# Patient Record
Sex: Female | Born: 1965 | Race: White | Hispanic: No | Smoking: Never smoker
Health system: Southern US, Community
[De-identification: ages and names within clinical notes are randomized; demographics above are authoritative.]

## PROBLEM LIST (undated history)

## (undated) ENCOUNTER — Encounter

## (undated) ENCOUNTER — Telehealth

## (undated) ENCOUNTER — Ambulatory Visit

## (undated) ENCOUNTER — Ambulatory Visit: Payer: PRIVATE HEALTH INSURANCE | Attending: Medical | Primary: Medical

## (undated) ENCOUNTER — Ambulatory Visit: Attending: Pharmacist | Primary: Pharmacist

## (undated) ENCOUNTER — Ambulatory Visit: Payer: PRIVATE HEALTH INSURANCE

## (undated) ENCOUNTER — Encounter: Attending: Internal Medicine | Primary: Internal Medicine

## (undated) ENCOUNTER — Ambulatory Visit: Payer: PRIVATE HEALTH INSURANCE | Attending: Podiatrist | Primary: Podiatrist

## (undated) ENCOUNTER — Inpatient Hospital Stay

## (undated) ENCOUNTER — Encounter: Attending: Transplant Hepatology | Primary: Transplant Hepatology

## (undated) ENCOUNTER — Other Ambulatory Visit

## (undated) ENCOUNTER — Inpatient Hospital Stay: Payer: PRIVATE HEALTH INSURANCE

## (undated) ENCOUNTER — Ambulatory Visit: Payer: PRIVATE HEALTH INSURANCE | Attending: Family Medicine | Primary: Family Medicine

## (undated) ENCOUNTER — Encounter
Attending: Student in an Organized Health Care Education/Training Program | Primary: Student in an Organized Health Care Education/Training Program

## (undated) ENCOUNTER — Ambulatory Visit
Payer: PRIVATE HEALTH INSURANCE | Attending: Student in an Organized Health Care Education/Training Program | Primary: Student in an Organized Health Care Education/Training Program

## (undated) ENCOUNTER — Ambulatory Visit: Payer: PRIVATE HEALTH INSURANCE | Attending: Hospice and Palliative Medicine | Primary: Hospice and Palliative Medicine

## (undated) ENCOUNTER — Ambulatory Visit: Payer: PRIVATE HEALTH INSURANCE | Attending: Foot & Ankle Surgery | Primary: Foot & Ankle Surgery

## (undated) ENCOUNTER — Telehealth
Attending: Student in an Organized Health Care Education/Training Program | Primary: Student in an Organized Health Care Education/Training Program

## (undated) ENCOUNTER — Emergency Department: Admission: EM | Payer: BC Managed Care – PPO

## (undated) DIAGNOSIS — M1990 Unspecified osteoarthritis, unspecified site: Secondary | ICD-10-CM

## (undated) DIAGNOSIS — G4700 Insomnia, unspecified: Secondary | ICD-10-CM

## (undated) DIAGNOSIS — R32 Unspecified urinary incontinence: Secondary | ICD-10-CM

## (undated) DIAGNOSIS — F329 Major depressive disorder, single episode, unspecified: Secondary | ICD-10-CM

## (undated) DIAGNOSIS — J45909 Unspecified asthma, uncomplicated: Secondary | ICD-10-CM

## (undated) DIAGNOSIS — R200 Anesthesia of skin: Secondary | ICD-10-CM

## (undated) DIAGNOSIS — C50919 Malignant neoplasm of unspecified site of unspecified female breast: Secondary | ICD-10-CM

## (undated) DIAGNOSIS — E119 Type 2 diabetes mellitus without complications: Secondary | ICD-10-CM

## (undated) DIAGNOSIS — F32A Depression, unspecified: Secondary | ICD-10-CM

## (undated) DIAGNOSIS — R42 Dizziness and giddiness: Secondary | ICD-10-CM

## (undated) DIAGNOSIS — G2581 Restless legs syndrome: Secondary | ICD-10-CM

## (undated) DIAGNOSIS — R252 Cramp and spasm: Secondary | ICD-10-CM

## (undated) DIAGNOSIS — K760 Fatty (change of) liver, not elsewhere classified: Secondary | ICD-10-CM

## (undated) DIAGNOSIS — M25569 Pain in unspecified knee: Secondary | ICD-10-CM

## (undated) DIAGNOSIS — E785 Hyperlipidemia, unspecified: Secondary | ICD-10-CM

## (undated) HISTORY — PX: CHOLECYSTECTOMY: SHX55

## (undated) HISTORY — PX: OTHER SURGICAL HISTORY: SHX169

## (undated) HISTORY — PX: ABDOMINAL HYSTERECTOMY: SHX81

---
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
## 2011-02-13 ENCOUNTER — Ambulatory Visit: Payer: Self-pay | Admitting: Gastroenterology

## 2011-02-20 ENCOUNTER — Ambulatory Visit: Payer: Self-pay | Admitting: Gastroenterology

## 2012-07-08 DIAGNOSIS — C50919 Malignant neoplasm of unspecified site of unspecified female breast: Secondary | ICD-10-CM

## 2012-07-08 HISTORY — DX: Malignant neoplasm of unspecified site of unspecified female breast: C50.919

## 2012-07-08 HISTORY — PX: BREAST LUMPECTOMY: SHX2

## 2013-04-06 ENCOUNTER — Ambulatory Visit: Payer: Self-pay | Admitting: Obstetrics and Gynecology

## 2013-04-14 ENCOUNTER — Ambulatory Visit: Payer: Self-pay | Admitting: Obstetrics and Gynecology

## 2013-04-26 ENCOUNTER — Ambulatory Visit: Payer: Self-pay | Admitting: Obstetrics and Gynecology

## 2013-04-26 HISTORY — PX: BREAST BIOPSY: SHX20

## 2013-05-08 HISTORY — PX: BREAST EXCISIONAL BIOPSY: SUR124

## 2013-05-13 ENCOUNTER — Ambulatory Visit: Payer: Self-pay | Admitting: Surgery

## 2013-05-13 LAB — BASIC METABOLIC PANEL
Anion Gap: 2 — ABNORMAL LOW (ref 7–16)
BUN: 7 mg/dL (ref 7–18)
Chloride: 105 mmol/L (ref 98–107)
Co2: 28 mmol/L (ref 21–32)
EGFR (African American): >60
EGFR (Non-African Amer.): >60
Glucose: 101 mg/dL — ABNORMAL HIGH (ref 65–99)
Osmolality: 268 (ref 275–301)
Potassium: 4.5 mmol/L (ref 3.5–5.1)
Sodium: 135 mmol/L — ABNORMAL LOW (ref 136–145)

## 2013-05-13 LAB — CBC WITH DIFFERENTIAL/PLATELET
Eosinophil #: 0.1 10*3/uL (ref 0.0–0.7)
Eosinophil %: 1.4 %
HCT: 40 % (ref 35.0–47.0)
HGB: 13.8 g/dL (ref 12.0–16.0)
Lymphocyte #: 2.4 10*3/uL (ref 1.0–3.6)
Lymphocyte %: 31.1 %
MCHC: 34.5 g/dL (ref 32.0–36.0)
MCV: 88 fL (ref 80–100)
Monocyte #: 0.5 x10 3/mm (ref 0.2–0.9)
Neutrophil %: 60.3 %
Platelet: 242 10*3/uL (ref 150–440)
RDW: 14 % (ref 11.5–14.5)

## 2013-05-18 ENCOUNTER — Ambulatory Visit: Payer: Self-pay | Admitting: Surgery

## 2013-06-01 ENCOUNTER — Ambulatory Visit: Payer: Self-pay | Admitting: Hematology and Oncology

## 2013-06-08 ENCOUNTER — Ambulatory Visit: Payer: Self-pay | Admitting: Radiation Oncology

## 2013-06-25 LAB — CBC CANCER CENTER
Basophil #: 0.1 x10 3/mm (ref 0.0–0.1)
Eosinophil %: 2.2 %
HCT: 41.6 % (ref 35.0–47.0)
HGB: 13.7 g/dL (ref 12.0–16.0)
MCH: 29.8 pg (ref 26.0–34.0)
Neutrophil #: 4.6 x10 3/mm (ref 1.4–6.5)
RBC: 4.6 10*6/uL (ref 3.80–5.20)
RDW: 13.5 % (ref 11.5–14.5)
WBC: 8.2 x10 3/mm (ref 3.6–11.0)

## 2013-06-25 LAB — COMPREHENSIVE METABOLIC PANEL
Albumin: 3.7 g/dL (ref 3.4–5.0)
Alkaline Phosphatase: 57 U/L
BUN: 9 mg/dL (ref 7–18)
Calcium, Total: 9.3 mg/dL (ref 8.5–10.1)
Creatinine: 0.87 mg/dL (ref 0.60–1.30)
EGFR (African American): >60
Potassium: 4.9 mmol/L (ref 3.5–5.1)
SGOT(AST): 26 U/L (ref 15–37)
Sodium: 138 mmol/L (ref 136–145)

## 2013-07-06 LAB — COMPREHENSIVE METABOLIC PANEL
Albumin: 3.7 g/dL (ref 3.4–5.0)
Anion Gap: 6 — ABNORMAL LOW (ref 7–16)
Creatinine: 0.91 mg/dL (ref 0.60–1.30)
Glucose: 153 mg/dL — ABNORMAL HIGH (ref 65–99)
Osmolality: 274 (ref 275–301)
SGPT (ALT): 55 U/L (ref 12–78)
Sodium: 136 mmol/L (ref 136–145)

## 2013-07-06 LAB — CBC CANCER CENTER
Basophil %: 0.7 %
Eosinophil #: 0.2 x10 3/mm (ref 0.0–0.7)
Eosinophil %: 2.3 %
HCT: 41.8 % (ref 35.0–47.0)
Lymphocyte #: 3.1 x10 3/mm (ref 1.0–3.6)
MCHC: 33 g/dL (ref 32.0–36.0)
MCV: 90 fL (ref 80–100)
Monocyte #: 0.5 x10 3/mm (ref 0.2–0.9)
Monocyte %: 5.5 %
Neutrophil #: 4.5 x10 3/mm (ref 1.4–6.5)
Neutrophil %: 53.8 %
Platelet: 255 x10 3/mm (ref 150–440)
RDW: 13.3 % (ref 11.5–14.5)
WBC: 8.3 x10 3/mm (ref 3.6–11.0)

## 2013-07-08 ENCOUNTER — Ambulatory Visit: Payer: Self-pay | Admitting: Radiation Oncology

## 2013-09-28 ENCOUNTER — Ambulatory Visit: Payer: Self-pay | Admitting: Hematology and Oncology

## 2013-10-01 LAB — CANCER ANTIGEN 27.29: CA 27.29: 23.4 U/mL (ref 0.0–38.6)

## 2013-10-06 ENCOUNTER — Ambulatory Visit: Payer: Self-pay | Admitting: Hematology and Oncology

## 2014-03-08 ENCOUNTER — Ambulatory Visit: Payer: Self-pay | Admitting: Internal Medicine

## 2014-04-01 LAB — COMPREHENSIVE METABOLIC PANEL
ALBUMIN: 3.5 g/dL (ref 3.4–5.0)
ANION GAP: 6 — AB (ref 7–16)
AST: 65 U/L — AB (ref 15–37)
Alkaline Phosphatase: 71 U/L
BUN: 7 mg/dL (ref 7–18)
Bilirubin,Total: 0.4 mg/dL (ref 0.2–1.0)
CALCIUM: 8.9 mg/dL (ref 8.5–10.1)
Chloride: 100 mmol/L (ref 98–107)
Co2: 28 mmol/L (ref 21–32)
Creatinine: 1 mg/dL (ref 0.60–1.30)
EGFR (African American): >60
Glucose: 135 mg/dL — ABNORMAL HIGH (ref 65–99)
Osmolality: 268 (ref 275–301)
Potassium: 4.1 mmol/L (ref 3.5–5.1)
SGPT (ALT): 81 U/L — ABNORMAL HIGH
Sodium: 134 mmol/L — ABNORMAL LOW (ref 136–145)
Total Protein: 6.9 g/dL (ref 6.4–8.2)

## 2014-04-01 LAB — CBC CANCER CENTER
BASOS PCT: 0.7 %
Basophil #: 0 x10 3/mm (ref 0.0–0.1)
EOS ABS: 0.1 x10 3/mm (ref 0.0–0.7)
EOS PCT: 1.9 %
HCT: 42.7 % (ref 35.0–47.0)
HGB: 14.2 g/dL (ref 12.0–16.0)
LYMPHS ABS: 1.7 x10 3/mm (ref 1.0–3.6)
Lymphocyte %: 30.6 %
MCH: 30.6 pg (ref 26.0–34.0)
MCHC: 33.3 g/dL (ref 32.0–36.0)
MCV: 92 fL (ref 80–100)
MONOS PCT: 5.9 %
Monocyte #: 0.3 x10 3/mm (ref 0.2–0.9)
Neutrophil #: 3.4 x10 3/mm (ref 1.4–6.5)
Neutrophil %: 60.9 %
Platelet: 185 x10 3/mm (ref 150–440)
RBC: 4.64 10*6/uL (ref 3.80–5.20)
RDW: 13.2 % (ref 11.5–14.5)
WBC: 5.6 x10 3/mm (ref 3.6–11.0)

## 2014-04-04 LAB — CANCER ANTIGEN 27.29: CA 27.29: 19.8 U/mL (ref 0.0–38.6)

## 2014-04-07 ENCOUNTER — Ambulatory Visit: Payer: Self-pay | Admitting: Internal Medicine

## 2014-04-26 ENCOUNTER — Encounter: Payer: Self-pay | Admitting: Internal Medicine

## 2014-05-08 ENCOUNTER — Ambulatory Visit: Payer: Self-pay | Admitting: Internal Medicine

## 2014-09-30 ENCOUNTER — Ambulatory Visit
Admit: 2014-09-30 | Disposition: A | Discharge status: Home / Self Care | Payer: Self-pay | Attending: Internal Medicine | Admitting: Internal Medicine

## 2014-10-28 NOTE — Op Note (Signed)
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 final pathology pending.   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   PROCEDURE PERFORMED: Right breast needle localization, excisional biopsy.   SURGEON: Sherri Rad, MD   ASSISTANT: None.   ANESTHESIA: General with local.   ESTIMATED BLOOD LOSS: 20 mL.   DESCRIPTION OF PROCEDURE: With appropriate ammogram directed needle localization on the right side, the patient was brought to the Operating Room and positioned supine. Percutaneous monitoring lines were placed. General anesthesia was used. The right breast was sterilely prepped and draped with ChloraPrep solution. A timeout was observed. Wire was amputated near the skin site. A curvilinear incision in the right upper outer quadrant was fashioned medial to the wire entrance site. The wire was brought up through this incision. Circumferential biopsy was then achieved tracking medially and slightly superiorly to directly behind the nipple. Wiretip was captured in its entirety with a robust biopsy specimen. A Hemoclip was placed at the tip of the needle site. The specimen was oriented for pathology and submitted in a mammogram specimen container, returning adequate specimen by mammography.   The wound was then irrigated, hemostasis being obtained with point cautery. A total of 30 mL of 0.25% plain Marcaine was infiltrated along all skin and incisions, and deep. The wound was reapproximated in the deep layer with interrupted 3-0 Vicryl, interrupted 3-0 Vicryl in the deep dermal layer, 4-0 Vicryl subcuticular in the skin, benzoin, Steri-Strips, Telfa and Tegaderm. The patient was then subsequently extubated and sent to the recovery room in stable and satisfactory condition by Anesthesia  services.   ____________________________ Jeannette How Marisa Gravel, MD mab:mr D: 05/18/2013 17:56:15 ET T: 05/18/2013 20:08:30 ET JOB#: [id number]  cc: Elta Guadeloupe A. Marisa Gravel, MD, <Dictator> Hortencia Conradi MD ELECTRONICALLY SIGNED 05/23/2013 16:37

## 2014-10-28 NOTE — Consult Note (Signed)
Diagnosis:  HPI   patient referral to me was not indicated based on her having lobular carcinoma in situ which is not treated with radiation therapy. Patient may be a candidate for chemoprevention with tamoxifen and she has appointment in followup with Dr. Kallie Edward in 2 weeks. I have explained to her husband the entire situation. They are pleased with the overall explanation and followup appointment.  Allergies:   PCN: Other  Electronic Signatures: Fairy Ashlock, Roda Shutters (MD)  (Signed 02-Dec-14 15:31)  Authored: Diagnosis, Allergies   Last Updated: 02-Dec-14 15:31 by Armstead Peaks (MD)

## 2014-10-31 ENCOUNTER — Ambulatory Visit
Admit: 2014-10-31 | Disposition: A | Discharge status: Home / Self Care | Payer: Self-pay | Attending: Family Medicine | Admitting: Family Medicine

## 2014-12-04 IMAGING — MG MM ADDITIONAL VIEWS AT NO CHARGE
1 series · 3 of 3 positions shown · non-contrast
Comparison: 04/06/2013, baseline study

REASON FOR EXAM: AV RT BRST CALCS
COMMENTS:

PROCEDURE:     MAM - MAM DIG ADDVIEWS RT SCR  - April 14, 2013  [DATE]
CLINICAL DATA: The patient returns to evaluate 2 possible groupings
of calcifications in the right breast.
EXAM:
DIGITAL DIAGNOSTIC  right MAMMOGRAM

[R ML · right · 3 of 3 slices shown]
[im 1/3]
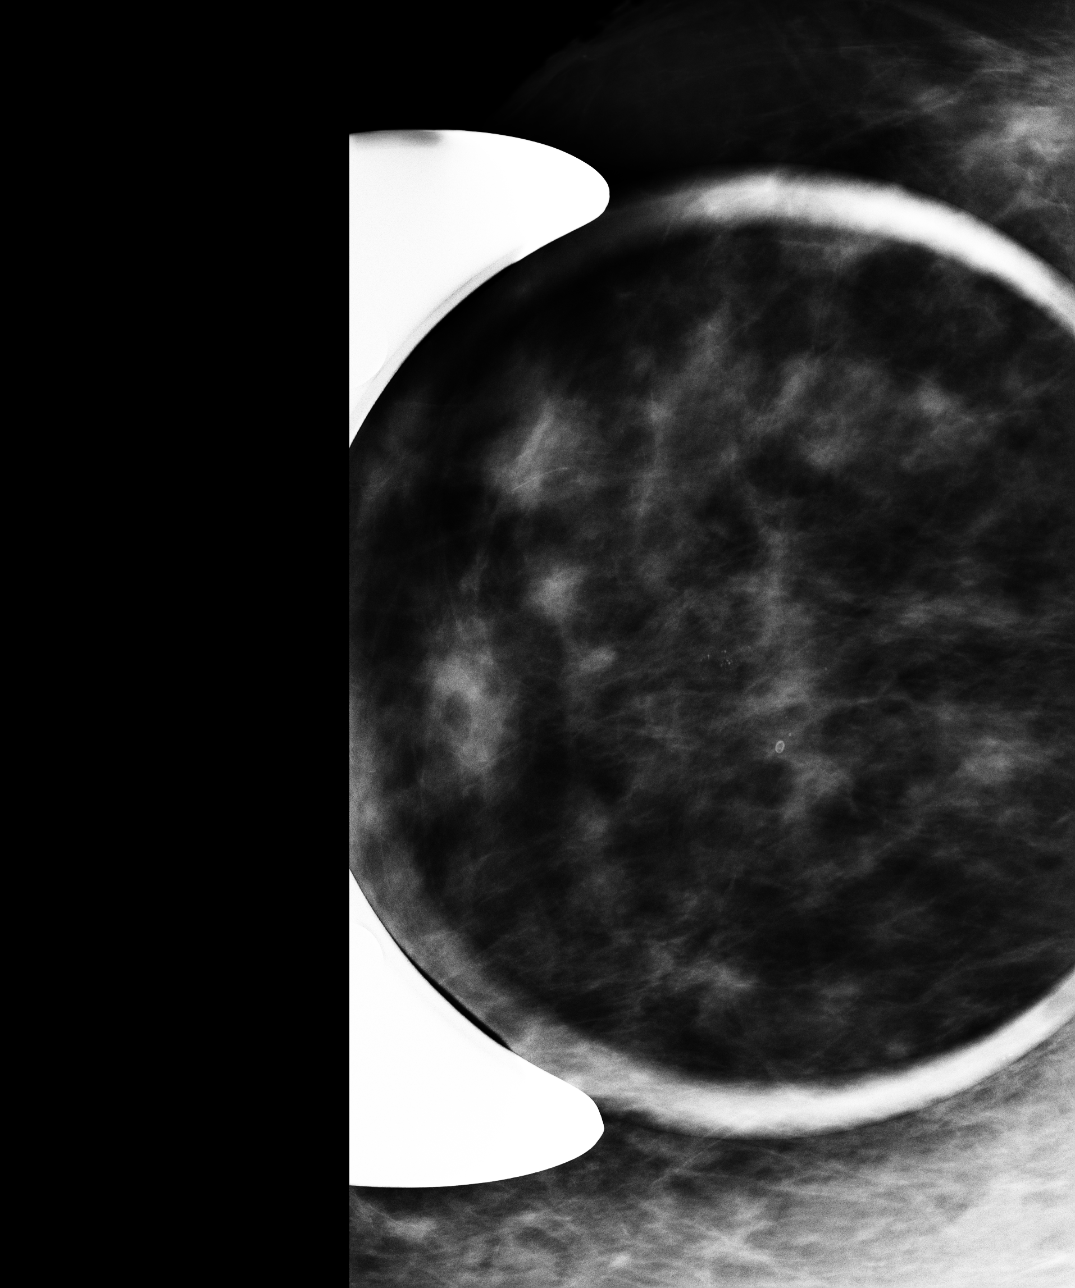
[im 2/3]
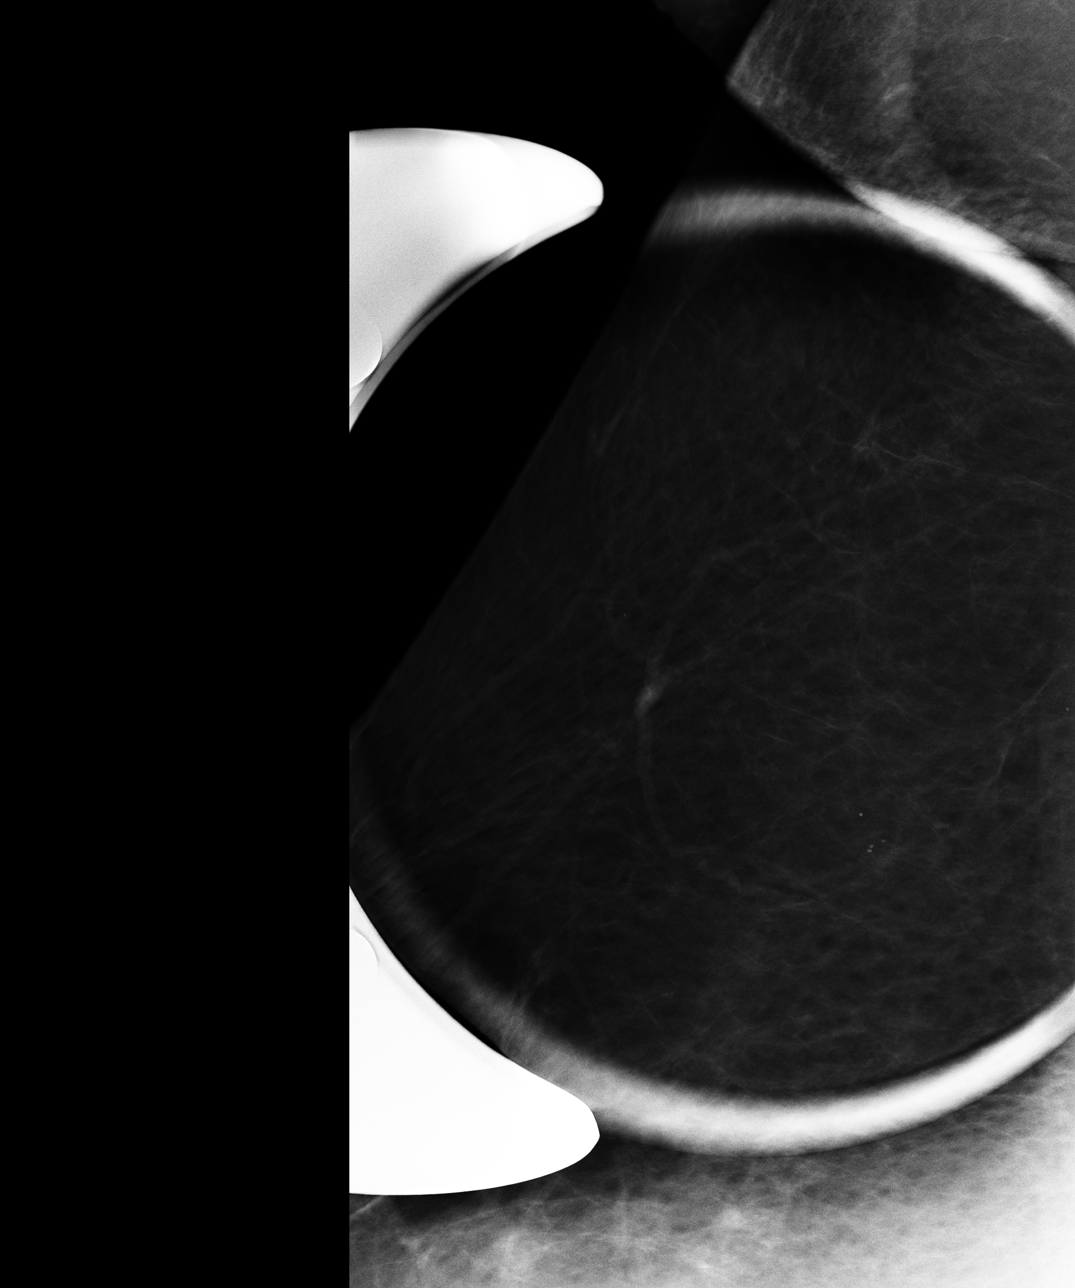
[im 3/3]
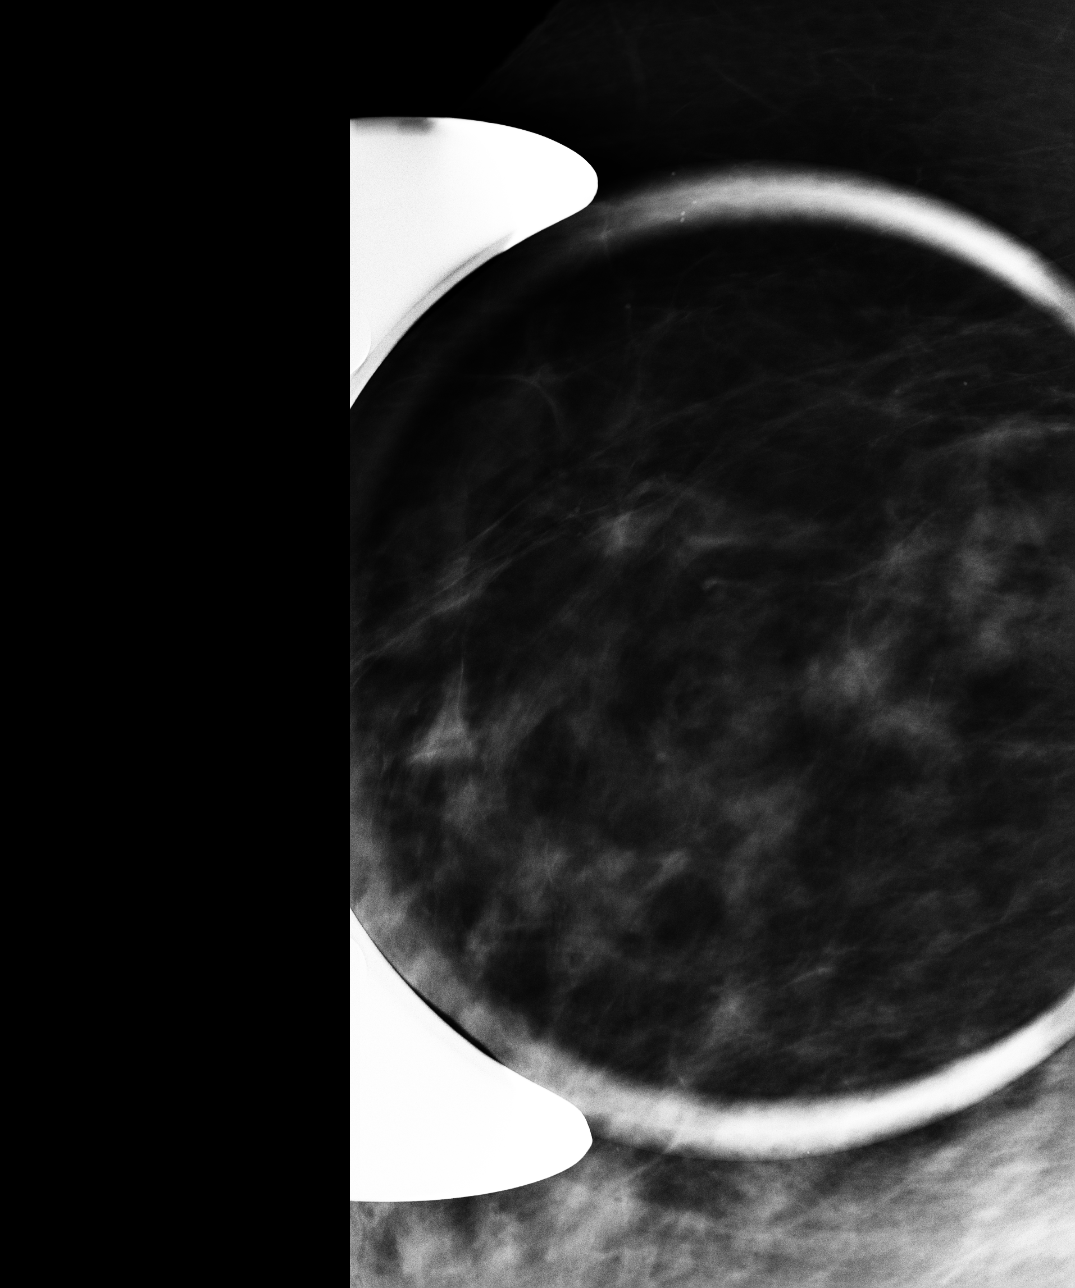

[3 of 3 positions shown; findings below may reference images not displayed]

ACR Breast Density Category c: The breasts are heterogeneously
dense, which may obscure small masses.
FINDINGS: Magnified views are performed of calcifications in the lateral
portion of the right breast. On magnified views, there is a small
grouping of calcifications in the anterior breast, measuring 5 mm in
diameter. Although primarily rounded, these calcifications also vary
in size, shape, and density. Biopsy is recommended to exclude ductal
carcinoma in situ. Within the upper outer, posterior portion of the
breast, no suspicious microcalcifications are identified.
IMPRESSION: Indeterminate calcifications in the lateral anterior portion of the
right breast.

RECOMMENDATION:
Stereotactic guided core biopsy is recommended.

I have discussed the findings and recommendations with the patient.
Results were also provided in writing at the conclusion of the
visit. If applicable, a reminder letter will be sent to the patient
regarding the next appointment.

BI-RADS CATEGORY  4: Suspicious abnormality - biopsy should be
considered.

## 2015-05-03 ENCOUNTER — Other Ambulatory Visit: Payer: Self-pay | Admitting: Family Medicine

## 2015-05-05 ENCOUNTER — Other Ambulatory Visit: Payer: Self-pay | Admitting: Family Medicine

## 2015-05-05 DIAGNOSIS — Z1231 Encounter for screening mammogram for malignant neoplasm of breast: Secondary | ICD-10-CM

## 2015-05-17 ENCOUNTER — Ambulatory Visit
Admission: RE | Admit: 2015-05-17 | Discharge: 2015-05-17 | Disposition: A | Discharge status: Home / Self Care | Payer: BC Managed Care – PPO | Source: Ambulatory Visit | Attending: Family Medicine | Admitting: Family Medicine

## 2015-05-17 ENCOUNTER — Other Ambulatory Visit: Payer: Self-pay | Admitting: Family Medicine

## 2015-05-17 DIAGNOSIS — Z1231 Encounter for screening mammogram for malignant neoplasm of breast: Secondary | ICD-10-CM

## 2015-05-17 DIAGNOSIS — Z853 Personal history of malignant neoplasm of breast: Secondary | ICD-10-CM | POA: Diagnosis present

## 2015-05-17 HISTORY — DX: Malignant neoplasm of unspecified site of unspecified female breast: C50.919

## 2016-03-29 ENCOUNTER — Other Ambulatory Visit: Payer: Self-pay | Admitting: Family Medicine

## 2016-03-29 DIAGNOSIS — Z1231 Encounter for screening mammogram for malignant neoplasm of breast: Secondary | ICD-10-CM

## 2016-06-24 ENCOUNTER — Other Ambulatory Visit: Payer: Self-pay | Admitting: Family Medicine

## 2016-06-26 ENCOUNTER — Other Ambulatory Visit: Payer: Self-pay | Admitting: Family Medicine

## 2016-06-26 DIAGNOSIS — Z853 Personal history of malignant neoplasm of breast: Secondary | ICD-10-CM

## 2016-07-12 ENCOUNTER — Ambulatory Visit
Admission: RE | Admit: 2016-07-12 | Discharge: 2016-07-12 | Disposition: A | Discharge status: Home / Self Care | Payer: BC Managed Care – PPO | Source: Ambulatory Visit | Attending: Family Medicine | Admitting: Family Medicine

## 2016-07-12 DIAGNOSIS — Z853 Personal history of malignant neoplasm of breast: Secondary | ICD-10-CM | POA: Insufficient documentation

## 2016-07-12 DIAGNOSIS — Z9889 Other specified postprocedural states: Secondary | ICD-10-CM | POA: Diagnosis not present

## 2017-01-24 MED ORDER — TRAZODONE 50 MG TABLET
ORAL_TABLET | 0 refills | 0 days | Status: CP
Start: 2017-01-24 — End: 2017-04-30

## 2017-01-24 MED ORDER — VENLAFAXINE 75 MG TABLET
ORAL_TABLET | Freq: Two times a day (BID) | ORAL | 0 refills | 0 days | Status: CP
Start: 2017-01-24 — End: 2017-04-28

## 2017-02-21 ENCOUNTER — Ambulatory Visit: Admission: RE | Admit: 2017-02-21 | Discharge: 2017-02-21 | Disposition: A | Discharge status: Home / Self Care

## 2017-02-21 DIAGNOSIS — R350 Frequency of micturition: Secondary | ICD-10-CM

## 2017-02-21 DIAGNOSIS — Z170 Estrogen receptor positive status [ER+]: Secondary | ICD-10-CM

## 2017-02-21 DIAGNOSIS — E119 Type 2 diabetes mellitus without complications: Principal | ICD-10-CM

## 2017-02-21 DIAGNOSIS — C50911 Malignant neoplasm of unspecified site of right female breast: Secondary | ICD-10-CM

## 2017-02-21 DIAGNOSIS — I10 Essential (primary) hypertension: Secondary | ICD-10-CM

## 2017-02-21 MED ORDER — LISINOPRIL 10 MG TABLET
ORAL_TABLET | Freq: Every day | ORAL | 1 refills | 0.00000 days | Status: CP
Start: 2017-02-21 — End: 2017-04-10

## 2017-02-21 MED ORDER — LORATADINE 10 MG TABLET
ORAL_TABLET | Freq: Every day | ORAL | 1 refills | 0 days | Status: CP
Start: 2017-02-21 — End: 2017-09-03

## 2017-02-21 MED ORDER — METFORMIN ER 500 MG TABLET,EXTENDED RELEASE 24 HR
ORAL_TABLET | Freq: Every day | ORAL | 1 refills | 0.00000 days | Status: CP
Start: 2017-02-21 — End: 2017-08-08

## 2017-02-21 MED ORDER — ATORVASTATIN 10 MG TABLET
ORAL_TABLET | Freq: Every day | ORAL | 1 refills | 0 days | Status: CP
Start: 2017-02-21 — End: 2017-08-08

## 2017-04-07 ENCOUNTER — Ambulatory Visit: Admission: RE | Admit: 2017-04-07 | Discharge: 2017-05-07 | Disposition: A | Discharge status: Home / Self Care

## 2017-04-10 ENCOUNTER — Ambulatory Visit: Admission: RE | Admit: 2017-04-10 | Discharge: 2017-04-10

## 2017-04-10 DIAGNOSIS — R05 Cough: Principal | ICD-10-CM

## 2017-04-10 DIAGNOSIS — I10 Essential (primary) hypertension: Secondary | ICD-10-CM

## 2017-04-10 DIAGNOSIS — R062 Wheezing: Secondary | ICD-10-CM

## 2017-04-10 MED ORDER — DEXTROMETHORPHAN-GUAIFENESIN 5 MG-100 MG/5 ML ORAL LIQUID
Freq: Four times a day (QID) | ORAL | 0 refills | 0 days | PRN
Start: 2017-04-10 — End: 2017-04-22

## 2017-04-10 MED ORDER — AZITHROMYCIN 250 MG TABLET
ORAL_TABLET | 0 refills | 0 days | Status: CP
Start: 2017-04-10 — End: 2017-04-15

## 2017-04-10 MED ORDER — HYDROCODONE-HOMATROPINE 5 MG-1.5 MG/5 ML ORAL SYRUP
Freq: Four times a day (QID) | ORAL | 0 refills | 0.00000 days | Status: CP | PRN
Start: 2017-04-10 — End: 2017-04-15

## 2017-04-10 MED ORDER — BENZONATATE 200 MG CAPSULE
ORAL_CAPSULE | Freq: Three times a day (TID) | ORAL | 1 refills | 0.00000 days | Status: CP | PRN
Start: 2017-04-10 — End: 2017-04-22

## 2017-04-10 MED ORDER — RANITIDINE 150 MG TABLET
ORAL_TABLET | Freq: Two times a day (BID) | ORAL | 1 refills | 0.00000 days
Start: 2017-04-10 — End: 2018-09-24

## 2017-04-16 MED ORDER — FLUCONAZOLE 150 MG TABLET
ORAL_TABLET | Freq: Once | ORAL | 0 refills | 0 days | Status: CP
Start: 2017-04-16 — End: 2017-04-16

## 2017-04-21 ENCOUNTER — Ambulatory Visit: Admit: 2017-04-21 | Discharge: 2017-04-21 | Disposition: A | Discharge status: Home / Self Care

## 2017-04-22 ENCOUNTER — Ambulatory Visit
Admission: RE | Admit: 2017-04-22 | Discharge: 2017-04-22 | Disposition: A | Discharge status: Home / Self Care | Attending: Nurse Practitioner

## 2017-04-22 DIAGNOSIS — R3989 Other symptoms and signs involving the genitourinary system: Principal | ICD-10-CM

## 2017-04-22 MED ORDER — FLUCONAZOLE 150 MG TABLET
ORAL_TABLET | 0 refills | 0 days | Status: CP
Start: 2017-04-22 — End: 2017-05-08

## 2017-04-22 MED ORDER — SULFAMETHOXAZOLE 800 MG-TRIMETHOPRIM 160 MG TABLET
ORAL_TABLET | 0 refills | 0 days | Status: CP
Start: 2017-04-22 — End: 2017-04-29

## 2017-04-25 ENCOUNTER — Ambulatory Visit: Admission: RE | Admit: 2017-04-25 | Discharge: 2017-04-25 | Attending: Surgical Oncology | Admitting: Surgical Oncology

## 2017-04-25 DIAGNOSIS — C50911 Malignant neoplasm of unspecified site of right female breast: Principal | ICD-10-CM

## 2017-04-28 MED ORDER — VENLAFAXINE 75 MG TABLET
ORAL_TABLET | Freq: Two times a day (BID) | ORAL | 3 refills | 0 days | Status: CP
Start: 2017-04-28 — End: 2017-08-08

## 2017-04-30 ENCOUNTER — Ambulatory Visit: Admission: RE | Admit: 2017-04-30 | Discharge: 2017-04-30 | Disposition: A | Discharge status: Home / Self Care

## 2017-04-30 ENCOUNTER — Ambulatory Visit
Admission: RE | Admit: 2017-04-30 | Discharge: 2017-04-30 | Disposition: A | Discharge status: Home / Self Care | Attending: Radiation Oncology | Admitting: Radiation Oncology

## 2017-04-30 ENCOUNTER — Ambulatory Visit
Admission: RE | Admit: 2017-04-30 | Discharge: 2017-04-30 | Disposition: A | Discharge status: Home / Self Care | Attending: Surgical Oncology | Admitting: Surgical Oncology

## 2017-04-30 DIAGNOSIS — R928 Other abnormal and inconclusive findings on diagnostic imaging of breast: Principal | ICD-10-CM

## 2017-04-30 DIAGNOSIS — D0511 Intraductal carcinoma in situ of right breast: Principal | ICD-10-CM

## 2017-04-30 DIAGNOSIS — Z86000 Personal history of in-situ neoplasm of breast: Secondary | ICD-10-CM

## 2017-04-30 MED ORDER — TRAZODONE 50 MG TABLET
ORAL_TABLET | Freq: Every evening | ORAL | 3 refills | 0.00000 days | Status: CP
Start: 2017-04-30 — End: 2017-08-08

## 2017-05-02 MED ORDER — FLUTICASONE PROPIONATE 220 MCG/ACTUATION HFA AEROSOL INHALER
Freq: Two times a day (BID) | RESPIRATORY_TRACT | 0 refills | 0.00000 days | Status: CP
Start: 2017-05-02 — End: 2017-08-08

## 2017-05-08 ENCOUNTER — Ambulatory Visit: Admission: RE | Admit: 2017-05-08 | Discharge: 2017-05-08

## 2017-05-08 DIAGNOSIS — I10 Essential (primary) hypertension: Secondary | ICD-10-CM

## 2017-05-08 DIAGNOSIS — D0501 Lobular carcinoma in situ of right breast: Secondary | ICD-10-CM

## 2017-05-08 DIAGNOSIS — R05 Cough: Principal | ICD-10-CM

## 2017-05-08 DIAGNOSIS — Z1239 Encounter for other screening for malignant neoplasm of breast: Secondary | ICD-10-CM

## 2017-05-20 ENCOUNTER — Other Ambulatory Visit: Payer: Self-pay | Admitting: Family Medicine

## 2017-05-20 DIAGNOSIS — Z1239 Encounter for other screening for malignant neoplasm of breast: Secondary | ICD-10-CM

## 2017-05-20 DIAGNOSIS — Z1231 Encounter for screening mammogram for malignant neoplasm of breast: Secondary | ICD-10-CM

## 2017-06-02 ENCOUNTER — Ambulatory Visit
Admission: RE | Admit: 2017-06-02 | Discharge: 2017-06-02 | Disposition: A | Discharge status: Home / Self Care | Attending: Obstetrics & Gynecology

## 2017-06-02 DIAGNOSIS — Z01419 Encounter for gynecological examination (general) (routine) without abnormal findings: Principal | ICD-10-CM

## 2017-06-02 DIAGNOSIS — N3946 Mixed incontinence: Secondary | ICD-10-CM

## 2017-07-15 ENCOUNTER — Ambulatory Visit
Admission: RE | Admit: 2017-07-15 | Discharge: 2017-07-15 | Disposition: A | Discharge status: Home / Self Care | Payer: BC Managed Care – PPO | Source: Ambulatory Visit | Attending: Family Medicine | Admitting: Family Medicine

## 2017-07-15 DIAGNOSIS — Z1231 Encounter for screening mammogram for malignant neoplasm of breast: Secondary | ICD-10-CM | POA: Insufficient documentation

## 2017-07-15 DIAGNOSIS — I10 Essential (primary) hypertension: Secondary | ICD-10-CM | POA: Insufficient documentation

## 2017-07-15 DIAGNOSIS — Z1239 Encounter for other screening for malignant neoplasm of breast: Secondary | ICD-10-CM

## 2017-08-08 ENCOUNTER — Ambulatory Visit: Admit: 2017-08-08 | Discharge: 2017-08-09 | Payer: PRIVATE HEALTH INSURANCE

## 2017-08-08 DIAGNOSIS — I10 Essential (primary) hypertension: Secondary | ICD-10-CM

## 2017-08-08 DIAGNOSIS — B070 Plantar wart: Secondary | ICD-10-CM

## 2017-08-08 DIAGNOSIS — R928 Other abnormal and inconclusive findings on diagnostic imaging of breast: Secondary | ICD-10-CM

## 2017-08-08 DIAGNOSIS — E119 Type 2 diabetes mellitus without complications: Principal | ICD-10-CM

## 2017-08-08 MED ORDER — METFORMIN ER 500 MG TABLET,EXTENDED RELEASE 24 HR
ORAL_TABLET | Freq: Every day | ORAL | 3 refills | 0 days | Status: CP
Start: 2017-08-08 — End: 2018-08-19

## 2017-08-08 MED ORDER — FLUTICASONE PROPIONATE 220 MCG/ACTUATION HFA AEROSOL INHALER
Freq: Two times a day (BID) | RESPIRATORY_TRACT | 3 refills | 0 days | Status: CP
Start: 2017-08-08 — End: ?

## 2017-08-08 MED ORDER — TRAZODONE 50 MG TABLET
ORAL_TABLET | Freq: Every evening | ORAL | 3 refills | 0 days | Status: CP
Start: 2017-08-08 — End: 2018-10-22

## 2017-08-08 MED ORDER — ATORVASTATIN 10 MG TABLET
ORAL_TABLET | Freq: Every day | ORAL | 3 refills | 0 days | Status: CP
Start: 2017-08-08 — End: 2018-08-20

## 2017-08-08 MED ORDER — VENLAFAXINE 75 MG TABLET
ORAL_TABLET | Freq: Every day | ORAL | 3 refills | 0.00000 days | Status: CP
Start: 2017-08-08 — End: 2018-10-09

## 2017-08-13 ENCOUNTER — Other Ambulatory Visit: Payer: Self-pay | Admitting: Family Medicine

## 2017-08-13 DIAGNOSIS — R928 Other abnormal and inconclusive findings on diagnostic imaging of breast: Secondary | ICD-10-CM

## 2017-09-03 MED ORDER — LORATADINE 10 MG TABLET
ORAL_TABLET | 1 refills | 0 days | Status: CP
Start: 2017-09-03 — End: ?

## 2017-10-21 ENCOUNTER — Ambulatory Visit: Admit: 2017-10-21 | Discharge: 2017-10-22 | Payer: PRIVATE HEALTH INSURANCE

## 2017-10-21 DIAGNOSIS — Z79899 Other long term (current) drug therapy: Secondary | ICD-10-CM

## 2017-10-21 DIAGNOSIS — R350 Frequency of micturition: Secondary | ICD-10-CM

## 2017-10-21 DIAGNOSIS — D489 Neoplasm of uncertain behavior, unspecified: Secondary | ICD-10-CM

## 2017-10-21 DIAGNOSIS — D485 Neoplasm of uncertain behavior of skin: Principal | ICD-10-CM

## 2017-10-21 DIAGNOSIS — M545 Low back pain: Secondary | ICD-10-CM

## 2017-10-21 MED ORDER — CYCLOBENZAPRINE 10 MG TABLET
ORAL_TABLET | Freq: Three times a day (TID) | ORAL | 0 refills | 0 days | Status: CP
Start: 2017-10-21 — End: ?

## 2017-10-23 ENCOUNTER — Ambulatory Visit: Admit: 2017-10-23 | Discharge: 2017-10-24 | Payer: PRIVATE HEALTH INSURANCE

## 2017-10-23 DIAGNOSIS — L820 Inflamed seborrheic keratosis: Secondary | ICD-10-CM

## 2017-10-23 DIAGNOSIS — Z85828 Personal history of other malignant neoplasm of skin: Secondary | ICD-10-CM

## 2017-10-23 DIAGNOSIS — D485 Neoplasm of uncertain behavior of skin: Principal | ICD-10-CM

## 2017-12-11 ENCOUNTER — Ambulatory Visit: Admit: 2017-12-11 | Discharge: 2017-12-12 | Payer: PRIVATE HEALTH INSURANCE

## 2017-12-11 DIAGNOSIS — E119 Type 2 diabetes mellitus without complications: Secondary | ICD-10-CM

## 2017-12-11 DIAGNOSIS — R3915 Urgency of urination: Principal | ICD-10-CM

## 2017-12-11 DIAGNOSIS — R300 Dysuria: Secondary | ICD-10-CM

## 2017-12-11 MED ORDER — SULFAMETHOXAZOLE 800 MG-TRIMETHOPRIM 160 MG TABLET
ORAL_TABLET | Freq: Two times a day (BID) | ORAL | 0 refills | 0 days | Status: CP
Start: 2017-12-11 — End: 2018-01-06

## 2018-01-06 ENCOUNTER — Ambulatory Visit: Admit: 2018-01-06 | Discharge: 2018-01-07 | Payer: PRIVATE HEALTH INSURANCE

## 2018-01-06 DIAGNOSIS — E119 Type 2 diabetes mellitus without complications: Principal | ICD-10-CM

## 2018-01-06 DIAGNOSIS — M25571 Pain in right ankle and joints of right foot: Secondary | ICD-10-CM

## 2018-01-06 MED ORDER — GLIPIZIDE 5 MG TABLET
ORAL_TABLET | Freq: Two times a day (BID) | ORAL | 12 refills | 0.00000 days | Status: CP
Start: 2018-01-06 — End: ?

## 2018-01-12 ENCOUNTER — Ambulatory Visit
Admission: RE | Admit: 2018-01-12 | Discharge: 2018-01-12 | Disposition: A | Discharge status: Home / Self Care | Payer: BC Managed Care – PPO | Source: Ambulatory Visit | Attending: Family Medicine | Admitting: Family Medicine

## 2018-01-12 DIAGNOSIS — R928 Other abnormal and inconclusive findings on diagnostic imaging of breast: Secondary | ICD-10-CM | POA: Diagnosis present

## 2018-01-16 ENCOUNTER — Other Ambulatory Visit: Payer: BC Managed Care – PPO

## 2018-03-06 ENCOUNTER — Ambulatory Visit
Admit: 2018-03-06 | Discharge: 2018-03-07 | Payer: PRIVATE HEALTH INSURANCE | Attending: Physician Assistant | Primary: Physician Assistant

## 2018-03-06 DIAGNOSIS — M79671 Pain in right foot: Principal | ICD-10-CM

## 2018-03-06 DIAGNOSIS — M722 Plantar fascial fibromatosis: Secondary | ICD-10-CM

## 2018-03-10 ENCOUNTER — Institutional Professional Consult (permissible substitution)
Admit: 2018-03-10 | Discharge: 2018-03-11 | Payer: PRIVATE HEALTH INSURANCE | Attending: Family Medicine | Primary: Family Medicine

## 2018-03-10 DIAGNOSIS — M79671 Pain in right foot: Principal | ICD-10-CM

## 2018-04-09 ENCOUNTER — Ambulatory Visit: Admit: 2018-04-09 | Discharge: 2018-04-10 | Payer: PRIVATE HEALTH INSURANCE

## 2018-04-09 DIAGNOSIS — M19041 Primary osteoarthritis, right hand: Secondary | ICD-10-CM

## 2018-04-09 DIAGNOSIS — R030 Elevated blood-pressure reading, without diagnosis of hypertension: Secondary | ICD-10-CM

## 2018-04-09 DIAGNOSIS — M79671 Pain in right foot: Secondary | ICD-10-CM

## 2018-04-09 DIAGNOSIS — M25571 Pain in right ankle and joints of right foot: Secondary | ICD-10-CM

## 2018-04-09 DIAGNOSIS — E119 Type 2 diabetes mellitus without complications: Principal | ICD-10-CM

## 2018-06-23 ENCOUNTER — Ambulatory Visit: Admit: 2018-06-23 | Discharge: 2018-06-24 | Payer: PRIVATE HEALTH INSURANCE

## 2018-06-23 DIAGNOSIS — R350 Frequency of micturition: Secondary | ICD-10-CM

## 2018-06-23 DIAGNOSIS — R197 Diarrhea, unspecified: Secondary | ICD-10-CM

## 2018-06-23 DIAGNOSIS — E119 Type 2 diabetes mellitus without complications: Principal | ICD-10-CM

## 2018-06-23 DIAGNOSIS — R1031 Right lower quadrant pain: Secondary | ICD-10-CM

## 2018-06-23 MED ORDER — CIPROFLOXACIN 500 MG TABLET
ORAL_TABLET | Freq: Two times a day (BID) | ORAL | 0 refills | 0 days | Status: CP
Start: 2018-06-23 — End: 2018-07-03

## 2018-06-26 ENCOUNTER — Other Ambulatory Visit: Payer: Self-pay | Admitting: Family Medicine

## 2018-06-26 DIAGNOSIS — Z1239 Encounter for other screening for malignant neoplasm of breast: Secondary | ICD-10-CM

## 2018-06-26 DIAGNOSIS — D0501 Lobular carcinoma in situ of right breast: Secondary | ICD-10-CM

## 2018-08-19 MED ORDER — METFORMIN ER 500 MG TABLET,EXTENDED RELEASE 24 HR
ORAL_TABLET | Freq: Every day | ORAL | 0 refills | 0 days | Status: CP
Start: 2018-08-19 — End: 2018-11-12

## 2018-08-20 MED ORDER — ATORVASTATIN 10 MG TABLET
ORAL_TABLET | Freq: Every day | ORAL | 0 refills | 0 days | Status: CP
Start: 2018-08-20 — End: 2018-11-12

## 2018-08-24 ENCOUNTER — Ambulatory Visit
Admission: RE | Admit: 2018-08-24 | Discharge: 2018-08-24 | Disposition: A | Discharge status: Home / Self Care | Payer: BC Managed Care – PPO | Source: Ambulatory Visit | Attending: Family Medicine | Admitting: Family Medicine

## 2018-08-24 DIAGNOSIS — D0501 Lobular carcinoma in situ of right breast: Secondary | ICD-10-CM | POA: Insufficient documentation

## 2018-08-24 DIAGNOSIS — Z1239 Encounter for other screening for malignant neoplasm of breast: Secondary | ICD-10-CM | POA: Insufficient documentation

## 2018-09-24 ENCOUNTER — Ambulatory Visit: Admit: 2018-09-24 | Discharge: 2018-09-25 | Payer: PRIVATE HEALTH INSURANCE

## 2018-09-24 DIAGNOSIS — E119 Type 2 diabetes mellitus without complications: Principal | ICD-10-CM

## 2018-09-24 DIAGNOSIS — Z79899 Other long term (current) drug therapy: Principal | ICD-10-CM

## 2018-09-24 DIAGNOSIS — F329 Major depressive disorder, single episode, unspecified: Principal | ICD-10-CM

## 2018-09-24 DIAGNOSIS — M549 Dorsalgia, unspecified: Principal | ICD-10-CM

## 2018-09-24 DIAGNOSIS — R5383 Other fatigue: Principal | ICD-10-CM

## 2018-09-24 DIAGNOSIS — K760 Fatty (change of) liver, not elsewhere classified: Principal | ICD-10-CM

## 2018-09-24 DIAGNOSIS — M2550 Pain in unspecified joint: Principal | ICD-10-CM

## 2018-09-24 DIAGNOSIS — E669 Obesity, unspecified: Principal | ICD-10-CM

## 2018-09-24 DIAGNOSIS — R7303 Prediabetes: Principal | ICD-10-CM

## 2018-10-05 MED ORDER — BENZONATATE 200 MG CAPSULE
ORAL_CAPSULE | Freq: Three times a day (TID) | ORAL | 1 refills | 0 days | Status: CP | PRN
Start: 2018-10-05 — End: 2019-10-05

## 2018-10-09 MED ORDER — VENLAFAXINE 75 MG TABLET
ORAL_TABLET | 3 refills | 0 days | Status: CP
Start: 2018-10-09 — End: ?

## 2018-10-22 MED ORDER — TRAZODONE 50 MG TABLET
ORAL_TABLET | 3 refills | 0 days | Status: CP
Start: 2018-10-22 — End: ?

## 2018-11-12 MED ORDER — METFORMIN ER 500 MG TABLET,EXTENDED RELEASE 24 HR
ORAL_TABLET | Freq: Every day | ORAL | 0 refills | 0.00000 days | Status: CP
Start: 2018-11-12 — End: 2019-02-08

## 2018-11-12 MED ORDER — ATORVASTATIN 10 MG TABLET
ORAL_TABLET | Freq: Every day | ORAL | 0 refills | 0 days | Status: CP
Start: 2018-11-12 — End: 2019-02-08

## 2018-11-30 ENCOUNTER — Ambulatory Visit
Admission: EM | Admit: 2018-11-30 | Discharge: 2018-11-30 | Disposition: A | Discharge status: Home / Self Care | Payer: BC Managed Care – PPO

## 2018-11-30 ENCOUNTER — Ambulatory Visit (INDEPENDENT_AMBULATORY_CARE_PROVIDER_SITE_OTHER): Payer: BC Managed Care – PPO

## 2018-11-30 ENCOUNTER — Ambulatory Visit: Payer: BC Managed Care – PPO

## 2018-11-30 ENCOUNTER — Other Ambulatory Visit: Payer: Self-pay

## 2018-11-30 ENCOUNTER — Encounter: Payer: Self-pay | Admitting: Emergency Medicine

## 2018-11-30 DIAGNOSIS — M25562 Pain in left knee: Secondary | ICD-10-CM

## 2018-11-30 HISTORY — DX: Type 2 diabetes mellitus without complications: E11.9

## 2018-11-30 MED ORDER — NAPROXEN 500 MG PO TABS: 500.0000 mg | ORAL_TABLET | Freq: Two times a day (BID) | ORAL | 0 refills | Status: DC

## 2018-11-30 MED ORDER — PREDNISONE 20 MG PO TABS: 20.0000 mg | ORAL_TABLET | Freq: Every day | ORAL | 0 refills | Status: DC

## 2018-11-30 NOTE — Discharge Instructions (Addendum)
It was very nice meeting you today in clinic. Thank you for entrusting me with your care.   As discussed, your xray was NORMAL. Suspect that the pain is compensatory due to the way that you are walking because of the tendon issue in your right foot/heel.   Please utilize the medications that we discussed. Your prescriptions have been called in to your pharmacy.  Wear brace for comfort.  Use heat/ice to help with the pain.   Make arrangements to follow up with orthopedics in 1 week for re-evaluation if not improving If your symptoms/condition worsens, please seek follow up care either here or in the ER. Please remember, our [place] providers are "right here with you" when you need [place].   Again, it was my pleasure to take care of you today. Thank you for choosing our clinic. I hope that you start to feel better quickly.   Honor Loh, MSN, APRN, FNP-C, CEN Advanced Practice Provider [facility]

## 2018-11-30 NOTE — ED Provider Notes (Signed)
[address], [place], [place] [address] [phone]   Name: Marisa Hogan DOB: Mar 16, 1966 MRN: [id number] CSN: [id number] PCP: Verdie Shire, MD  Arrival date and time:  11/30/18 1227  Chief Complaint:  Knee Pain (left)  NOTE: Prior to seeing the patient today, I have reviewed the triage nursing documentation and vital signs. Clinical staff has updated patient's PMH/PSHx, current medication list, and drug allergies/intolerances to ensure comprehensive history available to assist in medical decision making.   History:   HPI: Marisa Hogan is a 53 y.o. female who presents today with complaints of pain in her LEFT knee.  PMH (+) for chronic knee pain, however patient notes that this is different.  She denies injury.  Patient has been taking ibuprofen to help with the pain, however she notes minimal effectiveness.  Additionally, patient has a Salonpas patch in place today in clinic.  Patient denies any associated swelling in her knee.  Pain is exacerbated by weightbearing and ambulation.  Relieving factors include rest, elevation, and ice application.  Of note, patient has a contralateral Achilles tendon issue that is causing alterations in her normal gait pattern.  Past Medical History:  Diagnosis Date   Breast cancer [facility]) 2014   right breast lumpectomy. No chemo or rad tx.    Diabetes mellitus without complication [facility])     Past Surgical History:  Procedure Laterality Date   BREAST BIOPSY Right 04/26/2013   bx +   BREAST EXCISIONAL BIOPSY Right 05/08/2013   lumpectomy only    Family History  Problem Relation Age of Onset   COPD Mother    Heart disease Father     Social History   Socioeconomic History   Marital status: Divorced    Spouse name: Not on file   Number of children: Not on file   Years of education: Not on file   Highest education level: Not on file  Occupational History   Not on file  Social Needs   Financial resource strain:  Not on file   Food insecurity:    Worry: Not on file    Inability: Not on file   Transportation needs:    Medical: Not on file    Non-medical: Not on file  Tobacco Use   Smoking status: Never Smoker   Smokeless tobacco: Never Used  Substance and Sexual Activity   Alcohol use: Not Currently   Drug use: Not Currently   Sexual activity: Not on file  Lifestyle   Physical activity:    Days per week: Not on file    Minutes per session: Not on file   Stress: Not on file  Relationships   Social connections:    Talks on phone: Not on file    Gets together: Not on file    Attends religious service: Not on file    Active member of club or organization: Not on file    Attends meetings of clubs or organizations: Not on file    Relationship status: Not on file   Intimate partner violence:    Fear of current or ex partner: Not on file    Emotionally abused: Not on file    Physically abused: Not on file    Forced sexual activity: Not on file  Other Topics Concern   Not on file  Social History Narrative   Not on file    There are no active problems to display for this patient.   Home Medications:    Current Meds  Medication Sig   aspirin EC 81 MG tablet Take by mouth.   atorvastatin (LIPITOR) 10 MG tablet TAKE 1 TABLET (10 MG TOTAL) BY MOUTH DAILY WITH EVENING MEAL.   glipiZIDE (GLUCOTROL) 5 MG tablet glipizide 5 mg tablet   metFORMIN (GLUCOPHAGE-XR) 500 MG 24 hr tablet Take by mouth.   traZODone (DESYREL) 50 MG tablet TAKE 1 TABLET (50 MG TOTAL) BY MOUTH NIGHTLY.   venlafaxine (EFFEXOR) 75 MG tablet Take 75 mg by mouth daily.    Allergies:   Penicillins  Review of Systems (ROS): Review of Systems  Constitutional: Negative for chills and fever.  Respiratory: Negative for cough and shortness of breath.   Cardiovascular: Negative for chest pain and palpitations.  Musculoskeletal:       LEFT knee pain  Neurological: Negative for dizziness, weakness and  numbness.     Physical Exam:  Triage Vital Signs ED Triage Vitals  Enc Vitals Group     BP 11/30/18 1256 (!) 142/81     Pulse Rate 11/30/18 1256 80     Resp 11/30/18 1256 18     Temp 11/30/18 1256 97.8 F (36.6 C)     Temp Source 11/30/18 1256 Oral     SpO2 11/30/18 1256 98 %     Weight 11/30/18 1251 235 lb (106.6 kg)     Height 11/30/18 1251 5\' 5"  (1.651 m)     Head Circumference --      Peak Flow --      Pain Score 11/30/18 1250 8     Pain Loc --      Pain Edu? --      Excl. in [place]? --     Physical Exam  Constitutional: She is oriented to person, place, and time and well-developed, well-nourished, and in no distress.  HENT:  Head: Normocephalic and atraumatic.  Mouth/Throat: Mucous membranes are normal.  Eyes: Pupils are equal, round, and reactive to light.  Cardiovascular: Normal rate, regular rhythm, normal heart sounds and intact distal pulses. Exam reveals no gallop and no friction rub.  No murmur heard. Pulmonary/Chest: Effort normal and breath sounds normal. No respiratory distress. She has no wheezes. She has no rales.  Musculoskeletal:     Left knee: She exhibits normal range of motion, no swelling, no ecchymosis, no deformity, no laceration, no erythema, normal alignment, no LCL laxity, normal patellar mobility, no bony tenderness, normal meniscus and no MCL laxity. Tenderness found. Medial joint line and lateral joint line tenderness noted. No MCL, no LCL and no patellar tendon tenderness noted.     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   Neurological: She is alert and oriented to person, place, and time.  Skin: Skin is warm and dry. No rash noted. No erythema.  Psychiatric: Mood, affect and judgment normal.  Nursing note and vitals reviewed.    Urgent Care Treatments / Results:   LABS: PLEASE NOTE: all labs that were ordered this encounter are listed, however only abnormal results are displayed. Labs  Reviewed - No data to display  EKG: -None  RADIOLOGY: Dg Knee Complete 4 Views Left  Result Date: 11/30/2018 CLINICAL DATA:  Left knee pain EXAM: LEFT KNEE - COMPLETE 4+ VIEW COMPARISON:  None. FINDINGS: No fracture or dislocation of the left knee. Mild tricompartmental arthrosis. Small nonspecific knee joint effusion. IMPRESSION: No fracture or dislocation of the left knee. Mild tricompartmental arthrosis. Small nonspecific knee joint effusion. Electronically Signed   By: Cristie Hem  Laqueta Carina M.D.   On: 11/30/2018 13:39    PRODEDURES: Procedures  MEDICATIONS RECEIVED THIS VISIT: Medications - No data to display  PERTINENT CLINICAL COURSE NOTES/UPDATES: No data to display   Initial Impression / Assessment and Plan / Urgent Care Course:    Marisa Hogan is a 53 y.o. female who presents to [facility] today with complaints of Knee Pain (left)  Pertinent labs & imaging results that were available during my care of the patient were personally reviewed by me and considered in my medical decision making (see lab/imaging section of note for values and interpretations).  Exam reveals no bony tenderness.  There is no joint laxity.  Brace in place upon arrival to clinic.  Discussed that radiographs negative for acute osseous abnormality.  Suspect compensatory injury as patient is favoring her LEFT lower extremity when ambulating due to known Achilles tendinopathy on the right.  Will prescribe anti-inflammatory (Naprosyn) and short steroid burst to help with inflammation.  Of note, the dose and duration of steroid therapy reduced due to PMH (+) for NIDDM.  Patient indicates that blood sugars are generally well well controlled with current oral antidiabetic agents.  She was encouraged to monitor her sugars and anticipate slight elevations.  Patient states, "I have had oral steroids in the past and not had problem with them ".  Patient encouraged to continue cryotherapy as needed for symptomatic  relief.  Discussed follow up with orthopedics physician in 1 week for re-evaluation if pain has not improved.  I provided her with the name and contact information for Dr. Earnestine Leys (orthopedics).  I have reviewed the follow up and strict return precautions for any new or worsening symptoms. Patient is aware of symptoms that would be deemed urgent/emergent, and would thus require further evaluation either here or in the emergency department. At the time of discharge, she verbalized understanding and consent with the discharge plan as it was reviewed with her. All questions were fielded by provider and/or clinic staff prior to patient discharge.    Final Clinical Impressions(s) / Urgent Care Diagnoses:   Final diagnoses:  Acute pain of left knee    New Prescriptions:   Meds ordered this encounter  Medications   naproxen (NAPROSYN) 500 MG tablet    Sig: Take 1 tablet (500 mg total) by mouth 2 (two) times daily.    Dispense:  30 tablet    Refill:  0   predniSONE (DELTASONE) 20 MG tablet    Sig: Take 1 tablet (20 mg total) by mouth daily.    Dispense:  3 tablet    Refill:  0    Controlled Substance Prescriptions:  [place] Controlled Substance Registry consulted? Not Applicable  NOTE: This note was prepared using Dragon dictation software along with smaller phrase technology. Despite my best ability to proofread, there is the potential that transcriptional errors may still occur from this process, and are completely unintentional.     [name], NP 11/30/18 2014

## 2018-11-30 NOTE — ED Triage Notes (Signed)
Pt c/o left knee pain. Started last night. Caused her to not sleep well. No known injury. She has tried a knee brace, ibuprofen and ice with no relief. She states pain is worse when walking.

## 2018-12-28 ENCOUNTER — Other Ambulatory Visit: Payer: Self-pay | Admitting: Surgery

## 2018-12-28 DIAGNOSIS — S83232A Complex tear of medial meniscus, current injury, left knee, initial encounter: Secondary | ICD-10-CM

## 2019-01-12 ENCOUNTER — Ambulatory Visit
Admission: RE | Admit: 2019-01-12 | Discharge: 2019-01-12 | Disposition: A | Discharge status: Home / Self Care | Payer: BC Managed Care – PPO | Source: Ambulatory Visit | Attending: Surgery | Admitting: Surgery

## 2019-01-12 ENCOUNTER — Other Ambulatory Visit: Payer: Self-pay

## 2019-01-12 DIAGNOSIS — S83232A Complex tear of medial meniscus, current injury, left knee, initial encounter: Secondary | ICD-10-CM | POA: Diagnosis not present

## 2019-02-05 ENCOUNTER — Encounter
Admission: RE | Admit: 2019-02-05 | Discharge: 2019-02-05 | Disposition: A | Discharge status: Home / Self Care | Payer: BC Managed Care – PPO | Source: Ambulatory Visit | Attending: Surgery | Admitting: Surgery

## 2019-02-05 ENCOUNTER — Other Ambulatory Visit: Payer: Self-pay

## 2019-02-05 HISTORY — DX: Pain in unspecified knee: M25.569

## 2019-02-05 HISTORY — DX: Fatty (change of) liver, not elsewhere classified: K76.0

## 2019-02-05 HISTORY — DX: Unspecified asthma, uncomplicated: J45.909

## 2019-02-05 HISTORY — DX: Depression, unspecified: F32.A

## 2019-02-05 HISTORY — DX: Hyperlipidemia, unspecified: E78.5

## 2019-02-05 NOTE — Patient Instructions (Signed)
Your procedure is scheduled on: 02/11/2019 Thurs Report to Same Day Surgery 2nd floor medical mall [facility] Entrance-take elevator on left to 2nd floor.  Check in with surgery information desk.) To find out your arrival time please call [phone] between 1PM - 3PM on 02/10/2019 Wed  Remember: Instructions that are not followed completely may result in serious medical risk, up to and including death, or upon the discretion of your surgeon and anesthesiologist your surgery may need to be rescheduled.    _x___ 1. Do not eat food after midnight the night before your procedure. You may drink clear liquids up to 2 hours before you are scheduled to arrive at the hospital for your procedure.  Do not drink clear liquids within 2 hours of your scheduled arrival to the hospital.  Clear liquids include  --Water or Apple juice without pulp  --Clear carbohydrate beverage such as ClearFast or Gatorade  --Black Coffee or Clear Tea (No milk, no creamers, do not add anything to                  the coffee or Tea Type 1 and type 2 diabetics should only drink water.   ____Ensure clear carbohydrate drink on the way to the hospital for bariatric patients  ____Ensure clear carbohydrate drink 3 hours before surgery.   No gum chewing or hard candies.     __x__ 2. No Alcohol for 24 hours before or after surgery.   __x__3. No Smoking or e-cigarettes for 24 prior to surgery.  Do not use any chewable tobacco products for at least 6 hour prior to surgery   ____  4. Bring all medications with you on the day of surgery if instructed.    __x__ 5. Notify your doctor if there is any change in your medical condition     (cold, fever, infections).    x___6. On the morning of surgery brush your teeth with toothpaste and water.  You may rinse your mouth with mouth wash if you wish.  Do not swallow any toothpaste or mouthwash.   Do not wear jewelry, make-up, hairpins, clips or nail polish.  Do not wear lotions,  powders, or perfumes. You may wear deodorant.  Do not shave 48 hours prior to surgery. Men may shave face and neck.  Do not bring valuables to the hospital.    [facility] is not responsible for any belongings or valuables.               Contacts, dentures or bridgework may not be worn into surgery.  Leave your suitcase in the car. After surgery it may be brought to your room.  For patients admitted to the hospital, discharge time is determined by your                       treatment team.  _  Patients discharged the day of surgery will not be allowed to drive home.  You will need someone to drive you home and stay with you the night of your procedure.    Please read over the following fact sheets that you were given:   [facility] Preparing for Surgery and or MRSA Information   _x___ Take anti-hypertensive listed below, cardiac, seizure, asthma,     anti-reflux and psychiatric medicines. These include:  1. venlafaxine (EFFEXOR) 75 MG tablet  2.  3.  4.  5.  6.  ____Fleets enema or Magnesium Citrate as directed.   _x___  Use CHG Soap or sage wipes as directed on instruction sheet   ____ Use inhalers on the day of surgery and bring to hospital day of surgery  __x__ Stop Metformin and Janumet 2 days prior to surgery.    ____ Take 1/2 of usual insulin dose the night before surgery and none on the morning     surgery.   _x___ Follow recommendations from Cardiologist, Pulmonologist or PCP regarding          stopping Aspirin, Coumadin, Plavix ,Eliquis, Effient, or Pradaxa, and Pletal.  X____Stop Anti-inflammatories such as Advil, Aleve, Ibuprofen, Motrin, Naproxen, Naprosyn, Goodies powders or aspirin products. OK to take Tylenol and                          Celebrex.   _x___ Stop supplements until after surgery.  But may continue Vitamin D, Vitamin B,       and multivitamin.   ____ Bring C-Pap to the hospital.

## 2019-02-08 ENCOUNTER — Other Ambulatory Visit: Payer: BC Managed Care – PPO

## 2019-02-08 ENCOUNTER — Encounter
Admission: RE | Admit: 2019-02-08 | Discharge: 2019-02-08 | Disposition: A | Discharge status: Home / Self Care | Payer: BC Managed Care – PPO | Source: Ambulatory Visit | Attending: Surgery | Admitting: Surgery

## 2019-02-08 ENCOUNTER — Other Ambulatory Visit: Payer: Self-pay

## 2019-02-08 DIAGNOSIS — Z01810 Encounter for preprocedural cardiovascular examination: Secondary | ICD-10-CM | POA: Diagnosis not present

## 2019-02-08 DIAGNOSIS — Z20828 Contact with and (suspected) exposure to other viral communicable diseases: Secondary | ICD-10-CM | POA: Diagnosis not present

## 2019-02-08 DIAGNOSIS — E119 Type 2 diabetes mellitus without complications: Secondary | ICD-10-CM | POA: Diagnosis not present

## 2019-02-08 DIAGNOSIS — Z01818 Encounter for other preprocedural examination: Secondary | ICD-10-CM | POA: Diagnosis not present

## 2019-02-08 DIAGNOSIS — R9431 Abnormal electrocardiogram [ECG] [EKG]: Secondary | ICD-10-CM | POA: Diagnosis not present

## 2019-02-08 LAB — BASIC METABOLIC PANEL
Anion gap: 9 (ref 5–15)
BUN: 11 mg/dL (ref 6–20)
CO2: 24 mmol/L (ref 22–32)
Calcium: 9.2 mg/dL (ref 8.9–10.3)
Chloride: 103 mmol/L (ref 98–111)
Creatinine, Ser: 0.59 mg/dL (ref 0.44–1.00)
GFR calc Af Amer: >60 mL/min (ref >60)
GFR calc non Af Amer: >60 mL/min (ref >60)
Glucose, Bld: 232 mg/dL — ABNORMAL HIGH (ref 70–99)
Potassium: 4.3 mmol/L (ref 3.5–5.1)
Sodium: 136 mmol/L (ref 135–145)

## 2019-02-08 LAB — CBC
HCT: 39.6 % (ref 36.0–46.0)
Hemoglobin: 13 g/dL (ref 12.0–15.0)
MCH: 30.3 pg (ref 26.0–34.0)
MCHC: 32.8 g/dL (ref 30.0–36.0)
MCV: 92.3 fL (ref 80.0–100.0)
Platelets: 96 10*3/uL — ABNORMAL LOW (ref 150–400)
RBC: 4.29 MIL/uL (ref 3.87–5.11)
RDW: 12.8 % (ref 11.5–15.5)
WBC: 4.7 10*3/uL (ref 4.0–10.5)
nRBC: 0 % (ref 0.0–0.2)

## 2019-02-08 LAB — SARS CORONAVIRUS 2 (TAT 6-24 HRS): SARS Coronavirus 2: NEGATIVE

## 2019-02-08 MED ORDER — METFORMIN ER 500 MG TABLET,EXTENDED RELEASE 24 HR
ORAL_TABLET | Freq: Every day | ORAL | 0 refills | 90.00000 days | Status: CP
Start: 2019-02-08 — End: ?

## 2019-02-08 MED ORDER — ATORVASTATIN 10 MG TABLET
ORAL_TABLET | Freq: Every day | ORAL | 0 refills | 90.00000 days | Status: CP
Start: 2019-02-08 — End: 2020-02-08

## 2019-02-08 NOTE — Pre-Procedure Instructions (Signed)
PLT COUNT FAXED TO [facility]

## 2019-02-10 MED ORDER — CLINDAMYCIN PHOSPHATE 900 MG/50ML IV SOLN
900.0000 mg | Freq: Once | INTRAVENOUS | Status: AC
  Administered 2019-02-11: 10:00:00 900 mg via INTRAVENOUS

## 2019-02-11 ENCOUNTER — Ambulatory Visit: Payer: BC Managed Care – PPO | Admitting: Anesthesiology

## 2019-02-11 ENCOUNTER — Encounter: Payer: Self-pay | Admitting: *Deleted

## 2019-02-11 ENCOUNTER — Encounter
Admission: RE | Disposition: A | Discharge status: Home / Self Care | Payer: Self-pay | Source: Home / Self Care | Attending: Surgery

## 2019-02-11 ENCOUNTER — Other Ambulatory Visit: Payer: Self-pay

## 2019-02-11 ENCOUNTER — Ambulatory Visit
Admission: RE | Admit: 2019-02-11 | Discharge: 2019-02-11 | Disposition: A | Discharge status: Home / Self Care | Payer: BC Managed Care – PPO | Attending: Surgery | Admitting: Surgery

## 2019-02-11 DIAGNOSIS — E119 Type 2 diabetes mellitus without complications: Secondary | ICD-10-CM | POA: Insufficient documentation

## 2019-02-11 DIAGNOSIS — Z7984 Long term (current) use of oral hypoglycemic drugs: Secondary | ICD-10-CM | POA: Diagnosis not present

## 2019-02-11 DIAGNOSIS — F329 Major depressive disorder, single episode, unspecified: Secondary | ICD-10-CM | POA: Diagnosis not present

## 2019-02-11 DIAGNOSIS — Z853 Personal history of malignant neoplasm of breast: Secondary | ICD-10-CM | POA: Insufficient documentation

## 2019-02-11 DIAGNOSIS — Z79899 Other long term (current) drug therapy: Secondary | ICD-10-CM | POA: Diagnosis not present

## 2019-02-11 DIAGNOSIS — Z7982 Long term (current) use of aspirin: Secondary | ICD-10-CM | POA: Diagnosis not present

## 2019-02-11 DIAGNOSIS — Z791 Long term (current) use of non-steroidal anti-inflammatories (NSAID): Secondary | ICD-10-CM | POA: Diagnosis not present

## 2019-02-11 DIAGNOSIS — M2242 Chondromalacia patellae, left knee: Secondary | ICD-10-CM | POA: Insufficient documentation

## 2019-02-11 DIAGNOSIS — Z6841 Body Mass Index (BMI) 40.0 and over, adult: Secondary | ICD-10-CM | POA: Insufficient documentation

## 2019-02-11 DIAGNOSIS — J45909 Unspecified asthma, uncomplicated: Secondary | ICD-10-CM | POA: Insufficient documentation

## 2019-02-11 DIAGNOSIS — S83232A Complex tear of medial meniscus, current injury, left knee, initial encounter: Secondary | ICD-10-CM | POA: Insufficient documentation

## 2019-02-11 DIAGNOSIS — X58XXXA Exposure to other specified factors, initial encounter: Secondary | ICD-10-CM | POA: Diagnosis not present

## 2019-02-11 DIAGNOSIS — M1712 Unilateral primary osteoarthritis, left knee: Secondary | ICD-10-CM | POA: Insufficient documentation

## 2019-02-11 HISTORY — PX: KNEE ARTHROSCOPY WITH MEDIAL MENISECTOMY: SHX5651

## 2019-02-11 LAB — GLUCOSE, CAPILLARY
Glucose-Capillary: 195 mg/dL — ABNORMAL HIGH (ref 70–99)
Glucose-Capillary: 177 mg/dL — ABNORMAL HIGH (ref 70–99)

## 2019-02-11 SURGERY — ARTHROSCOPY, KNEE, WITH MEDIAL MENISCECTOMY
Anesthesia: General | Site: Knee | Laterality: Left

## 2019-02-11 MED ORDER — MIDAZOLAM HCL 2 MG/2ML IJ SOLN
INTRAMUSCULAR | Status: DC | PRN
  Administered 2019-02-11: 2 mg via INTRAVENOUS

## 2019-02-11 MED ORDER — LIDOCAINE HCL (PF) 1 % IJ SOLN
INTRAMUSCULAR | Status: AC
  Filled 2019-02-11: qty 30

## 2019-02-11 MED ORDER — ONDANSETRON HCL 4 MG/2ML IJ SOLN
INTRAMUSCULAR | Status: AC
  Filled 2019-02-11: qty 2

## 2019-02-11 MED ORDER — PROPOFOL 500 MG/50ML IV EMUL
INTRAVENOUS | Status: AC
  Filled 2019-02-11: qty 50

## 2019-02-11 MED ORDER — ONDANSETRON HCL 4 MG/2ML IJ SOLN
INTRAMUSCULAR | Status: DC | PRN
  Administered 2019-02-11: 4 mg via INTRAVENOUS

## 2019-02-11 MED ORDER — FENTANYL CITRATE (PF) 100 MCG/2ML IJ SOLN
INTRAMUSCULAR | Status: DC | PRN
  Administered 2019-02-11 (×2): 50 ug via INTRAVENOUS
  Administered 2019-02-11: 100 ug via INTRAVENOUS

## 2019-02-11 MED ORDER — KETOROLAC TROMETHAMINE 30 MG/ML IJ SOLN
INTRAMUSCULAR | Status: DC | PRN
  Administered 2019-02-11: 15 mg via INTRAVENOUS

## 2019-02-11 MED ORDER — LIDOCAINE HCL (CARDIAC) PF 100 MG/5ML IV SOSY
PREFILLED_SYRINGE | INTRAVENOUS | Status: DC | PRN
  Administered 2019-02-11: 100 mg via INTRAVENOUS

## 2019-02-11 MED ORDER — CLINDAMYCIN PHOSPHATE 900 MG/50ML IV SOLN
INTRAVENOUS | Status: AC
  Filled 2019-02-11: qty 50

## 2019-02-11 MED ORDER — GLYCOPYRROLATE 0.2 MG/ML IJ SOLN
INTRAMUSCULAR | Status: AC
  Filled 2019-02-11: qty 1

## 2019-02-11 MED ORDER — KETOROLAC TROMETHAMINE 30 MG/ML IJ SOLN
INTRAMUSCULAR | Status: AC
  Filled 2019-02-11: qty 1

## 2019-02-11 MED ORDER — ONDANSETRON HCL 4 MG PO TABS: 4.0000 mg | ORAL_TABLET | Freq: Four times a day (QID) | ORAL | Status: DC | PRN

## 2019-02-11 MED ORDER — FAMOTIDINE 20 MG PO TABS
20.0000 mg | ORAL_TABLET | Freq: Once | ORAL | Status: AC
  Administered 2019-02-11: 20 mg via ORAL

## 2019-02-11 MED ORDER — MIDAZOLAM HCL 2 MG/2ML IJ SOLN
INTRAMUSCULAR | Status: AC
  Filled 2019-02-11: qty 2

## 2019-02-11 MED ORDER — ACETAMINOPHEN 160 MG/5ML PO SOLN
325.0000 mg | ORAL | Status: DC | PRN
  Filled 2019-02-11: qty 20.3

## 2019-02-11 MED ORDER — ACETAMINOPHEN 325 MG PO TABS: 325.0000 mg | ORAL_TABLET | ORAL | Status: DC | PRN

## 2019-02-11 MED ORDER — HYDROCODONE-ACETAMINOPHEN 5-325 MG PO TABS: 1.0000 | ORAL_TABLET | ORAL | Status: DC | PRN

## 2019-02-11 MED ORDER — BUPIVACAINE-EPINEPHRINE (PF) 0.5% -1:200000 IJ SOLN
INTRAMUSCULAR | Status: DC | PRN
  Administered 2019-02-11: 30 mL

## 2019-02-11 MED ORDER — MEPERIDINE HCL 50 MG/ML IJ SOLN: 6.2500 mg | INTRAMUSCULAR | Status: DC | PRN

## 2019-02-11 MED ORDER — PROMETHAZINE HCL 25 MG/ML IJ SOLN: 6.2500 mg | INTRAMUSCULAR | Status: DC | PRN

## 2019-02-11 MED ORDER — FENTANYL CITRATE (PF) 100 MCG/2ML IJ SOLN
INTRAMUSCULAR | Status: AC
  Filled 2019-02-11: qty 2

## 2019-02-11 MED ORDER — PHENYLEPHRINE HCL (PRESSORS) 10 MG/ML IV SOLN
INTRAVENOUS | Status: AC
  Filled 2019-02-11: qty 1

## 2019-02-11 MED ORDER — POTASSIUM CHLORIDE IN NACL 20-0.9 MEQ/L-% IV SOLN
INTRAVENOUS | Status: DC
  Filled 2019-02-11 (×3): qty 1000

## 2019-02-11 MED ORDER — LIDOCAINE HCL (PF) 4 % IJ SOLN
INTRAMUSCULAR | Status: DC | PRN
  Administered 2019-02-11: 4 mL via RESPIRATORY_TRACT

## 2019-02-11 MED ORDER — KETOROLAC TROMETHAMINE 30 MG/ML IJ SOLN: 30.0000 mg | Freq: Once | INTRAMUSCULAR | Status: DC | PRN

## 2019-02-11 MED ORDER — ROCURONIUM BROMIDE 100 MG/10ML IV SOLN
INTRAVENOUS | Status: DC | PRN
  Administered 2019-02-11: 50 mg via INTRAVENOUS

## 2019-02-11 MED ORDER — LIDOCAINE HCL 1 % IJ SOLN
INTRAMUSCULAR | Status: DC | PRN
  Administered 2019-02-11: 30 mL

## 2019-02-11 MED ORDER — ROCURONIUM BROMIDE 50 MG/5ML IV SOLN
INTRAVENOUS | Status: AC
  Filled 2019-02-11: qty 1

## 2019-02-11 MED ORDER — KETAMINE HCL 50 MG/ML IJ SOLN
INTRAMUSCULAR | Status: DC | PRN
  Administered 2019-02-11: 35 mg via INTRAVENOUS

## 2019-02-11 MED ORDER — BUPIVACAINE-EPINEPHRINE (PF) 0.5% -1:200000 IJ SOLN
INTRAMUSCULAR | Status: AC
  Filled 2019-02-11: qty 60

## 2019-02-11 MED ORDER — HYDROCODONE-ACETAMINOPHEN 5-325 MG PO TABS: 1.0000 | ORAL_TABLET | Freq: Four times a day (QID) | ORAL | 0 refills | Status: AC | PRN

## 2019-02-11 MED ORDER — SUGAMMADEX SODIUM 200 MG/2ML IV SOLN
INTRAVENOUS | Status: DC | PRN
  Administered 2019-02-11: 200 mg via INTRAVENOUS

## 2019-02-11 MED ORDER — FENTANYL CITRATE (PF) 100 MCG/2ML IJ SOLN: 25.0000 ug | INTRAMUSCULAR | Status: DC | PRN

## 2019-02-11 MED ORDER — HYDROCODONE-ACETAMINOPHEN 7.5-325 MG PO TABS
1.0000 | ORAL_TABLET | Freq: Once | ORAL | Status: DC | PRN
  Filled 2019-02-11: qty 1

## 2019-02-11 MED ORDER — LIDOCAINE HCL (PF) 2 % IJ SOLN
INTRAMUSCULAR | Status: AC
  Filled 2019-02-11: qty 10

## 2019-02-11 MED ORDER — DEXAMETHASONE SODIUM PHOSPHATE 4 MG/ML IJ SOLN
INTRAMUSCULAR | Status: DC | PRN
  Administered 2019-02-11: 6 mg via INTRAVENOUS

## 2019-02-11 MED ORDER — METOCLOPRAMIDE HCL 10 MG PO TABS: 5.0000 mg | ORAL_TABLET | Freq: Three times a day (TID) | ORAL | Status: DC | PRN

## 2019-02-11 MED ORDER — METOCLOPRAMIDE HCL 5 MG/ML IJ SOLN: 5.0000 mg | Freq: Three times a day (TID) | INTRAMUSCULAR | Status: DC | PRN

## 2019-02-11 MED ORDER — ONDANSETRON HCL 4 MG/2ML IJ SOLN: 4.0000 mg | Freq: Four times a day (QID) | INTRAMUSCULAR | Status: DC | PRN

## 2019-02-11 MED ORDER — FAMOTIDINE 20 MG PO TABS
ORAL_TABLET | ORAL | Status: AC
  Administered 2019-02-11: 08:00:00 20 mg via ORAL
  Filled 2019-02-11: qty 1

## 2019-02-11 MED ORDER — SUGAMMADEX SODIUM 200 MG/2ML IV SOLN
INTRAVENOUS | Status: AC
  Filled 2019-02-11: qty 2

## 2019-02-11 MED ORDER — SODIUM CHLORIDE 0.9 % IV SOLN
INTRAVENOUS | Status: DC
  Administered 2019-02-11: 08:00:00 via INTRAVENOUS

## 2019-02-11 MED ORDER — PROPOFOL 10 MG/ML IV BOLUS
INTRAVENOUS | Status: DC | PRN
  Administered 2019-02-11: 150 mg via INTRAVENOUS
  Administered 2019-02-11: 50 mg via INTRAVENOUS

## 2019-02-11 SURGICAL SUPPLY — 42 items
"PENCIL ELECTRO HAND CTR " (MISCELLANEOUS) ×1 IMPLANT
BAG COUNTER SPONGE EZ (MISCELLANEOUS) IMPLANT
BLADE FULL RADIUS 3.5 (BLADE) ×2 IMPLANT
BLADE SHAVER 4.5X7 STR FR (MISCELLANEOUS) ×2 IMPLANT
BNDG ELASTIC 6X5.8 VLCR STR LF (GAUZE/BANDAGES/DRESSINGS) ×2 IMPLANT
BRACE KNEE POST OP SHORT (BRACE) ×1 IMPLANT
CARTRIDGE SUT 2-0 NONSTITCH (Anchor) ×4 IMPLANT
CHLORAPREP W/TINT 26 (MISCELLANEOUS) ×2 IMPLANT
COVER WAND RF STERILE (DRAPES) ×2 IMPLANT
CUFF TOURN SGL QUICK 24 (TOURNIQUET CUFF)
CUFF TOURN SGL QUICK 30 (TOURNIQUET CUFF)
CUFF TRNQT CYL 24X4X16.5-23 (TOURNIQUET CUFF) IMPLANT
CUFF TRNQT CYL 30X4X21-28X (TOURNIQUET CUFF) IMPLANT
DRAPE SPLIT 6X30 W/TAPE (DRAPES) ×2 IMPLANT
ELECT REM PT RETURN 9FT ADLT (ELECTROSURGICAL) ×2
ELECTRODE REM PT RTRN 9FT ADLT (ELECTROSURGICAL) ×1 IMPLANT
GAUZE SPONGE 4X4 12PLY STRL (GAUZE/BANDAGES/DRESSINGS) ×2 IMPLANT
GLOVE BIO SURGEON STRL SZ8 (GLOVE) ×4 IMPLANT
GLOVE BIOGEL M 7.0 STRL (GLOVE) ×4 IMPLANT
GLOVE BIOGEL PI IND STRL 7.5 (GLOVE) ×1 IMPLANT
GLOVE BIOGEL PI INDICATOR 7.5 (GLOVE) ×1
GLOVE INDICATOR 8.0 STRL GRN (GLOVE) ×2 IMPLANT
GOWN STRL REUS W/ TWL LRG LVL3 (GOWN DISPOSABLE) ×1 IMPLANT
GOWN STRL REUS W/ TWL XL LVL3 (GOWN DISPOSABLE) ×2 IMPLANT
GOWN STRL REUS W/TWL LRG LVL3 (GOWN DISPOSABLE) ×1
GOWN STRL REUS W/TWL XL LVL3 (GOWN DISPOSABLE) ×2
IV LACTATED RINGER IRRG 3000ML (IV SOLUTION) ×1
IV LR IRRIG 3000ML ARTHROMATIC (IV SOLUTION) ×1 IMPLANT
KIT TURNOVER KIT A (KITS) ×2 IMPLANT
MANAGER SUT NOVOCUT (CUTTER) ×1 IMPLANT
MANIFOLD NEPTUNE II (INSTRUMENTS) ×2 IMPLANT
NDL HYPO 21X1.5 SAFETY (NEEDLE) ×1 IMPLANT
NEEDLE HYPO 21X1.5 SAFETY (NEEDLE) ×2 IMPLANT
NOVOSTICH PRO MENISCAL 2-0 (Miscellaneous) ×4 IMPLANT
PACK ARTHROSCOPY KNEE (MISCELLANEOUS) ×2 IMPLANT
PENCIL ELECTRO HAND CTR (MISCELLANEOUS) ×2 IMPLANT
SUT PROLENE 4 0 PS 2 18 (SUTURE) ×2 IMPLANT
SUT TICRON COATED BLUE 2 0 30 (SUTURE) IMPLANT
SYR 50ML LL SCALE MARK (SYRINGE) ×2 IMPLANT
SYSTEM NVSTCH PRO MENISCAL 2-0 (Miscellaneous) IMPLANT
TUBING ARTHRO INFLOW-ONLY STRL (TUBING) ×2 IMPLANT
WAND WEREWOLF FLOW 90D (MISCELLANEOUS) ×2 IMPLANT

## 2019-02-11 NOTE — Anesthesia Preprocedure Evaluation (Signed)
Anesthesia Evaluation  Patient identified by MRN, date of birth, ID band Patient awake    Reviewed: Allergy & Precautions, H&P , NPO status , reviewed documented beta blocker date and time   Airway Mallampati: II  TM Distance: >3 FB Neck ROM: full    Dental  (+) Chipped   Pulmonary asthma ,    Pulmonary exam normal        Cardiovascular Normal cardiovascular exam     Neuro/Psych PSYCHIATRIC DISORDERS Depression    GI/Hepatic neg GERD  ,  Endo/Other  diabetesMorbid obesity  Renal/GU      Musculoskeletal   Abdominal   Peds  Hematology Plts 96K, down from 185K 4 yrs ago. Discussed w pt/surgeon. No clinical issues w bleeding. Will have pt F/U w PCP.    Anesthesia Other Findings Past Medical History: No date: Asthma 2014: Breast cancer ([place])     Comment:  right breast lumpectomy. No chemo or rad tx.  No date: Depression No date: Diabetes mellitus without complication (HCC) No date: Elevated lipids No date: Fatty liver No date: Knee pain     Comment:  Left Past Surgical History: No date: ABDOMINAL HYSTERECTOMY No date: bladder tacking 04/26/2013: BREAST BIOPSY; Right     Comment:  bx + 05/08/2013: BREAST EXCISIONAL BIOPSY; Right     Comment:  lumpectomy only No date: CHOLECYSTECTOMY BMI    Body Mass Index: 40.77 kg/m     Reproductive/Obstetrics                             Anesthesia Physical Anesthesia Plan  ASA: III  Anesthesia Plan: General LMA   Post-op Pain Management:    Induction: Intravenous  PONV Risk Score and Plan: Ondansetron and Treatment may vary due to age or medical condition  Airway Management Planned: LMA  Additional Equipment:   Intra-op Plan:   Post-operative Plan: Extubation in OR  Informed Consent: I have reviewed the patients History and Physical, chart, labs and discussed the procedure including the risks, benefits and alternatives for the  proposed anesthesia with the patient or authorized representative who has indicated his/her understanding and acceptance.     Dental Advisory Given  Plan Discussed with: CRNA  Anesthesia Plan Comments:         Anesthesia Quick Evaluation

## 2019-02-11 NOTE — Transfer of Care (Signed)
Immediate Anesthesia Transfer of Care Note  Patient: Marisa Hogan  Procedure(s) Performed: KNEE ARTHROSCOPY WITH DEBRIDEMENT AND REPAIR VERSUS PARTIAL MEDIAL MENISECTOMY (Left Knee)  Patient Location: PACU  Anesthesia Type:General  Level of Consciousness: awake, alert  and oriented  Airway & Oxygen Therapy: Patient Spontanous Breathing and Patient connected to nasal cannula oxygen  Post-op Assessment: Report given to RN and Post -op Vital signs reviewed and stable  Post vital signs: Reviewed and stable  Last Vitals:  Vitals Value Taken Time  BP 127/67 02/11/19 1104  Temp    Pulse 99 02/11/19 1106  Resp 9 02/11/19 1106  SpO2 98 % 02/11/19 1106  Vitals shown include unvalidated device data.  Last Pain:  Vitals:   02/11/19 0724  TempSrc: Oral  PainSc: 0-No pain         Complications: No apparent anesthesia complications

## 2019-02-11 NOTE — Discharge Instructions (Addendum)
Orthopedic discharge instructions: Keep dressing dry and intact.  May shower after dressing changed on post-op day #4 (Monday).  Cover stitches with Band-Aids after drying off. Apply ice frequently to knee or use Polar Care. Take ibuprofen 800 mg TID with meals for 7-10 days, then as necessary. Take pain medication as prescribed when needed.  May supplement with ES Tylenol if necessary. No weight-bearing on left leg.  Keep brace locked in extension - use walker for ambulation. Follow-up in 10-14 days or as scheduled.  AMBULATORY SURGERY  DISCHARGE INSTRUCTIONS   1) The drugs that you were given will stay in your system until tomorrow so for the next 24 hours you should not:  A) Drive an automobile B) Make any legal decisions C) Drink any alcoholic beverage   2) You may resume regular meals tomorrow.  Today it is better to start with liquids and gradually work up to solid foods.  You may eat anything you prefer, but it is better to start with liquids, then soup and crackers, and gradually work up to solid foods.   3) Please notify your doctor immediately if you have any unusual bleeding, trouble breathing, redness and pain at the surgery site, drainage, fever, or pain not relieved by medication.    4) Additional Instructions:        Please contact your physician with any problems or Same Day Surgery at [phone], Monday through Friday 6 am to 4 pm, or [place] at [facility] number at [phone].

## 2019-02-11 NOTE — Op Note (Signed)
02/11/2019  11:11 AM  Patient:   Marisa Hogan  Pre-Op Diagnosis:   Complex posterior horn tear of medial meniscus with underlying degenerative joint disease, left knee.  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  Surgeon:   Pascal Lux, M.D.  Assistant:   Freddie Apley, PA-S  Anesthesia:   General LMA.  Findings:   As above. There was a primarily radial tear involving the posterior portion of the medial meniscus near the root. The lateral meniscus was in satisfactory condition, as were the anterior posterior cruciate ligaments. There were diffuse grade 3 chondromalacial changes involving the central portion of the patella and grade 2-3 chondromalacial changes involving the weightbearing portion of the medial femoral condyle. The lateral compartment was in satisfactory condition.  Complications:   None.  EBL:   5 cc.  Total fluids:   600 cc of crystalloid.  Tourniquet time:   None  Drains:   None  Closure:   4-0 Prolene interrupted sutures.  Brief clinical note:   The patient is a 53 year old female with a 2-3 month history of posterior medial left knee pain following a twisting injury. Her symptoms have persisted despite medications, activity modification, etc. Her history and examination are consistent with a medial meniscus tear confirmed by MRI scan. The patient presents at this time for arthroscopy, debridement, and repair versus partial medial meniscectomy.  Procedure:   The patient was brought into the operating room and lain in the supine position. After adequate general laryngeal mask anesthesia was obtained, a timeout was performed to verify the appropriate side. The patient's left knee was injected sterilely using a solution of 30 cc of 1% lidocaine and 30 cc of 0.5% Sensorcaine with epinephrine. The left lower  extremity was prepped with ChloraPrep solution before being draped sterilely. Preoperative antibiotics were administered. The expected portal sites were injected with 0.5% Sensorcaine with epinephrine before the camera was placed in the anterolateral portal and instrumentation performed through the anteromedial portal.   The knee was sequentially examined beginning in the suprapatellar pouch, then progressing to the patellofemoral space, the medial gutter and compartment, the notch, and finally the lateral compartment and gutter. The findings were as described above. Abundant reactive synovial tissues anteriorly were debrided using the full-radius resector in order to improve visualization. The frayed margins of the tear were lightly debrided using the full-radius resector and baskets before the tear was repaired using several side-to-side sutures placed using the [employer] Ceterix device. Subsequent probing of the repair demonstrated good stability. The areas of grade III chondromalacia involving the patella and medial femoral condyle were debrided back to stable margins using the full-radius resector. The ArthroCare wand set at the lowest setting was used to lightly "anneal" the articular cartilage of the patella. The instruments were removed from the joint after suctioning the excess fluid.   The portal sites were closed using 4-0 Prolene interrupted sutures before a sterile bulky dressing was applied to the knee. The patient was then awakened, extubated, and returned to the recovery room in satisfactory condition after tolerating the procedure well.

## 2019-02-11 NOTE — Anesthesia Post-op Follow-up Note (Signed)
Anesthesia QCDR form completed.        

## 2019-02-11 NOTE — Anesthesia Procedure Notes (Signed)
Procedure Name: Intubation Date/Time: 02/11/2019 9:29 AM Performed by: Bernardo Heater, CRNA Pre-anesthesia Checklist: Patient identified, Emergency Drugs available, Suction available and Patient being monitored Patient Re-evaluated:Patient Re-evaluated prior to induction Oxygen Delivery Method: Circle system utilized Preoxygenation: Pre-oxygenation with 100% oxygen Induction Type: IV induction Laryngoscope Size: Mac and 3 Grade View: Grade I Tube size: 7.0 mm Number of attempts: 1 Placement Confirmation: ETT inserted through vocal cords under direct vision,  positive ETCO2 and breath sounds checked- equal and bilateral Secured at: 21 cm Tube secured with: Tape Dental Injury: Teeth and Oropharynx as per pre-operative assessment

## 2019-02-11 NOTE — Anesthesia Postprocedure Evaluation (Signed)
Anesthesia Post Note  Patient: Marisa Hogan  Procedure(s) Performed: KNEE ARTHROSCOPY WITH DEBRIDEMENT AND REPAIR VERSUS PARTIAL MEDIAL MENISECTOMY (Left Knee)  Patient location during evaluation: PACU Anesthesia Type: General Level of consciousness: awake and alert Pain management: pain level controlled Vital Signs Assessment: post-procedure vital signs reviewed and stable Respiratory status: spontaneous breathing, nonlabored ventilation, respiratory function stable and patient connected to nasal cannula oxygen Cardiovascular status: blood pressure returned to baseline and stable Postop Assessment: no apparent nausea or vomiting Anesthetic complications: no     Last Vitals:  Vitals:   02/11/19 1156 02/11/19 1241  BP: (!) 144/73 (!) 148/86  Pulse: 97   Resp: 18 18  Temp: 36.6 C (!) 31.1 C  SpO2: 98% 100%    Last Pain:  Vitals:   02/11/19 1241  TempSrc:   PainSc: 0-No pain                 Alphonsus Sias

## 2019-02-11 NOTE — Progress Notes (Signed)
Ch visited pt in pre-op. Pt shared that she had injured her ankle when she fell down the stairs while helping her sister move. Ch provided words of encouragement as pt shared about her hopes for a speedy recovery.  No further needs at this time.    02/11/19 0800  Clinical Encounter Type  Visited With Patient  Visit Type Social support;Pre-op  Spiritual Encounters  Spiritual Needs Emotional;Grief support  Stress Factors  Patient Stress Factors Health changes;Major life changes  Family Stress Factors Health changes

## 2019-02-11 NOTE — H&P (Signed)
Paper H&P to be scanned into permanent record. H&P reviewed and patient re-examined. No changes. 

## 2019-03-11 ENCOUNTER — Ambulatory Visit: Admit: 2019-03-11 | Discharge: 2019-03-12 | Payer: PRIVATE HEALTH INSURANCE

## 2019-03-11 DIAGNOSIS — Z79899 Other long term (current) drug therapy: Secondary | ICD-10-CM

## 2019-03-11 DIAGNOSIS — R600 Localized edema: Secondary | ICD-10-CM

## 2019-03-11 DIAGNOSIS — E119 Type 2 diabetes mellitus without complications: Secondary | ICD-10-CM

## 2019-03-26 DIAGNOSIS — D696 Thrombocytopenia, unspecified: Secondary | ICD-10-CM

## 2019-05-17 ENCOUNTER — Other Ambulatory Visit: Payer: Self-pay | Admitting: Podiatry

## 2019-05-21 ENCOUNTER — Other Ambulatory Visit: Admission: RE | Admit: 2019-05-21 | Payer: BC Managed Care – PPO | Source: Ambulatory Visit

## 2019-06-13 DIAGNOSIS — E119 Type 2 diabetes mellitus without complications: Principal | ICD-10-CM

## 2019-06-14 MED ORDER — METFORMIN ER 500 MG TABLET,EXTENDED RELEASE 24 HR
ORAL_TABLET | Freq: Every day | ORAL | 0 refills | 90 days | Status: CP
Start: 2019-06-14 — End: ?

## 2019-06-14 MED ORDER — ATORVASTATIN 10 MG TABLET
ORAL_TABLET | Freq: Every day | ORAL | 0 refills | 90 days | Status: CP
Start: 2019-06-14 — End: 2020-06-13

## 2019-06-23 ENCOUNTER — Encounter: Payer: Self-pay | Admitting: Podiatry

## 2019-06-23 ENCOUNTER — Other Ambulatory Visit: Payer: Self-pay

## 2019-06-24 NOTE — Anesthesia Preprocedure Evaluation (Addendum)
Anesthesia Evaluation  Patient identified by MRN, date of birth, ID band Patient awake    Reviewed: NPO status   Airway Mallampati: II  TM Distance: >3 FB Neck ROM: full    Dental   Pulmonary asthma ,    breath sounds clear to auscultation       Cardiovascular negative cardio ROS Normal cardiovascular exam  Patient reported new-onset BLE edema at 03/2019 PCP visit which was new. She had a pro-BNP, CBC, and BMP drawn at that time were was normal. Per chart review has no symptoms c/f CHF.   Neuro/Psych Depression    GI/Hepatic   Endo/Other  diabetes  Renal/GU      Musculoskeletal   Abdominal   Peds  Hematology   Anesthesia Other Findings   Reproductive/Obstetrics                           Anesthesia Physical Anesthesia Plan  ASA: II  Anesthesia Plan: General and Regional   Post-op Pain Management:  Regional for Post-op pain and GA combined w/ Regional for post-op pain   Induction: Intravenous  PONV Risk Score and Plan:   Airway Management Planned: Oral ETT  Additional Equipment:   Intra-op Plan:   Post-operative Plan:   Informed Consent: I have reviewed the patients History and Physical, chart, labs and discussed the procedure including the risks, benefits and alternatives for the proposed anesthesia with the patient or authorized representative who has indicated his/her understanding and acceptance.       Plan Discussed with: CRNA and Anesthesiologist  Anesthesia Plan Comments:         Anesthesia Quick Evaluation

## 2019-06-25 ENCOUNTER — Other Ambulatory Visit: Payer: Self-pay

## 2019-06-25 ENCOUNTER — Other Ambulatory Visit
Admission: RE | Admit: 2019-06-25 | Discharge: 2019-06-25 | Disposition: A | Discharge status: Home / Self Care | Payer: BC Managed Care – PPO | Source: Ambulatory Visit | Attending: Podiatry | Admitting: Podiatry

## 2019-06-25 DIAGNOSIS — Z20828 Contact with and (suspected) exposure to other viral communicable diseases: Secondary | ICD-10-CM | POA: Diagnosis not present

## 2019-06-25 DIAGNOSIS — Z01812 Encounter for preprocedural laboratory examination: Secondary | ICD-10-CM | POA: Insufficient documentation

## 2019-06-25 LAB — SARS CORONAVIRUS 2 (TAT 6-24 HRS): SARS Coronavirus 2: NEGATIVE

## 2019-06-28 NOTE — H&P (Addendum)
PODIATRY / FOOT AND ANKLE SURGERY H&P  Chief Complaint: Right posterior heel pain   HPI: Marisa Hogan is a 53 y.o. female who presents with right posterior heel pain at the insertion of the Achilles tendon.  Patient states that she has had this pain for a number of years now.  She states that she cannot wear certain types of shoes because they rub on the back of the heel which causes pain.  Now the patient states that most of the time when she is walking she feels pain regardless of the therapies tried to the posterior aspect of the right heel.  She has tried physical therapy for a number of months for this.  She has tried changing shoe gear and wearing heel lifts.  She is also seen Dr. Roland Rack with orthopedics for this issue and she was referred to [place] today for surgical evaluation.    She has also been seen and treated for this issue by a podiatrist at [facility].  Patient states that she is tired of having this pain and has exhausted conservative therapies and would like to proceed with surgery if possible.  Patient is a diabetic and her last hemoglobin A1c was 6.9%.  She states that she does not smoke.  She also notes that she has bilateral lower extremity edema which she believes is likely contributed to varicose veins.  She has worn compression stockings in the past.  PMHx:  Past Medical History:  Diagnosis Date  . Arthritis    kness especially left  . Asthma    in past  . Breast cancer [facility] 2014   right breast lumpectomy. No chemo or rad tx.   . Depression   . Diabetes mellitus without complication ([place])    type 2  . Elevated lipids   . Fatty liver   . Hand numbness    left, ring and pinky finger  . Incontinence   . Insomnia   . Knee pain    Left  . Leg cramps   . Restless leg syndrome    off and on  . Vertigo    light changes and here and there    Surgical Hx:  Past Surgical History:  Procedure Laterality Date  . ABDOMINAL HYSTERECTOMY    . bladder tacking    . BREAST BIOPSY  Right 04/26/2013   bx +  . BREAST EXCISIONAL BIOPSY Right 05/08/2013   lumpectomy only  . CHOLECYSTECTOMY    . KNEE ARTHROSCOPY WITH MEDIAL MENISECTOMY Left 02/11/2019   Procedure: KNEE ARTHROSCOPY WITH DEBRIDEMENT AND REPAIR VERSUS PARTIAL MEDIAL MENISECTOMY;  Surgeon: Corky Mull, MD;  Location: [facility] ORS;  Service: Orthopedics;  Laterality: Left;    FHx:  Family History  Problem Relation Age of Onset  . COPD Mother   . Asthma Mother   . Heart disease Father   . Melanoma Father   . Skin cancer Sister     Social History:  reports that she has never smoked. She has never used smokeless tobacco. She reports previous alcohol use. She reports that she does not use drugs.  Allergies:  Allergies  Allergen Reactions  . Penicillins Hives and Swelling    Did it involve swelling of the face/tongue/throat, SOB, or low BP? Yes Did it involve sudden or severe rash/hives, skin peeling, or any reaction on the inside of your mouth or nose? No Did you need to seek medical attention at a hospital or doctor's office? No When did it last happen?Childhood allergy  If all above answers are "NO", may proceed with cephalosporin use.     Review of Systems: General ROS: negative Psychological ROS: negative Respiratory ROS: no cough, shortness of breath, or wheezing Cardiovascular ROS: no chest pain or dyspnea on exertion Gastrointestinal ROS: no abdominal pain, change in bowel habits, or black or bloody stools Musculoskeletal ROS: positive for - muscle pain and pain in foot - right Neurological ROS: negative Dermatological ROS: negative  No medications prior to admission.    Physical Exam: General/Constitutional: No apparent distress: well-nourished and well developed.  Resp: No labored breath sounds, no wheezing, no rhonci, no rales bil.  Lungs CTA bil.  CV: RRR, no murmur noted  Psych: Normal mood and affect, oriented to person, place and time (AOx3)  Vascular: DP/PT pulses  intact bil, CFT intact to digits foot bil, hair growth to digits foot noted bil.  Mild to moderate nonpitting bilateral lower extremity edema with varicosities present.  Neuro: Light touch sensation intact to digits bil  Derm: No open lesions or ulcerations noted BLE.  MSK: 5/5 strength to BLE MSK groups.  Limited ankle joint dorsiflexion with the knee extended and flexed bilateral.  Significant to severe pain on palpation to the posterior aspect of the right heel at the insertion of the Achilles tendon as well as the retrocalcaneal bursa area.  RCSP mild valgus bilateral.  Xrays: Right calcaneus x-rays 2 views lateral and Calc axial: Large posterior heel spur at the insertion of the Achilles tendon and Haglund's deformity with intratendinous calcification of the Achilles.  Small plantar heel spur at the insertion of the plantar fascia.  Results for orders placed or performed during the hospital encounter of 06/29/19 (from the past 48 hour(s))  Glucose, capillary     Status: Abnormal   Collection Time: 06/29/19 10:12 AM  Result Value Ref Range   Glucose-Capillary 168 (H) 70 - 99 mg/dL  Glucose, capillary     Status: Abnormal   Collection Time: 06/29/19  1:49 PM  Result Value Ref Range   Glucose-Capillary 111 (H) 70 - 99 mg/dL   No results found.  Blood pressure (!) 145/77, pulse 90, temperature (!) 97.3 F (36.3 C), resp. rate 12, height 5\' 5"  (1.651 m), weight 110.2 kg, SpO2 98 %.  Assessment 1. Haglunds deformity right 2. Posterior calcaneal heel spur right 3. Achilles tendonitis right 4. Equinus right 5. DM2 w/o complication or neuropathy, last A1c 6.9%  Plan -Pt was seen and examined. -X-rays reviewed and discussed with patient in detail.  Patient has a large insertional heel spur at the Achilles insertion on the calcaneus as well as intratendinous calcification distally of the Achilles tendon as well as Haglund's deformity. -Discussed etiology of patient's pain likely  contributed to insertional Achilles tendinitis with posterior heel spur and Haglund's deformity with equinus contracture. -Discussed with patient conservative therapies.  Patient states that she has tried all his therapies and would like to proceed with surgery at this time if possible.  Instructed patient that we could perform steroid injections and further physical therapy and/or night splints.  Patient declines these options and would like to proceed with surgery at this time. -All treatment options were discussed with the patient both conservative and surgical attempts at correction including potential risks and complications of surgical intervention.  At this time the patient has elected for surgical intervention consisting of right Haglund's resection with posterior heel spur resection with detachment and debridement and reattachment of Achilles tendon and gastroc recession.  Discussed possible  postoperative complications in detail with the patient including incision healing and tendon healing and patient is still agreeable to procedure.  Discussed postoperative course in detail consisting of likely 6 weeks nonweightbearing after the procedure which would require the patient not to drive for that period of time.  Patient would still like to proceed.  All questions answered.  Consent signed and placed in chart.  Caroline More 06/29/2019

## 2019-06-29 ENCOUNTER — Encounter
Admission: RE | Disposition: A | Discharge status: Home / Self Care | Payer: Self-pay | Source: Home / Self Care | Attending: Podiatry

## 2019-06-29 ENCOUNTER — Other Ambulatory Visit: Payer: Self-pay

## 2019-06-29 ENCOUNTER — Ambulatory Visit
Admission: RE | Admit: 2019-06-29 | Discharge: 2019-06-29 | Disposition: A | Discharge status: Home / Self Care | Payer: BC Managed Care – PPO | Attending: Podiatry | Admitting: Podiatry

## 2019-06-29 ENCOUNTER — Ambulatory Visit: Payer: BC Managed Care – PPO | Admitting: Anesthesiology

## 2019-06-29 ENCOUNTER — Encounter: Payer: Self-pay | Admitting: Podiatry

## 2019-06-29 DIAGNOSIS — G2581 Restless legs syndrome: Secondary | ICD-10-CM | POA: Diagnosis not present

## 2019-06-29 DIAGNOSIS — M7751 Other enthesopathy of right foot: Secondary | ICD-10-CM | POA: Diagnosis not present

## 2019-06-29 DIAGNOSIS — K760 Fatty (change of) liver, not elsewhere classified: Secondary | ICD-10-CM | POA: Diagnosis not present

## 2019-06-29 DIAGNOSIS — M7731 Calcaneal spur, right foot: Secondary | ICD-10-CM | POA: Diagnosis not present

## 2019-06-29 DIAGNOSIS — M7661 Achilles tendinitis, right leg: Secondary | ICD-10-CM | POA: Diagnosis present

## 2019-06-29 DIAGNOSIS — E119 Type 2 diabetes mellitus without complications: Secondary | ICD-10-CM | POA: Insufficient documentation

## 2019-06-29 DIAGNOSIS — Z09 Encounter for follow-up examination after completed treatment for conditions other than malignant neoplasm: Secondary | ICD-10-CM

## 2019-06-29 DIAGNOSIS — J45909 Unspecified asthma, uncomplicated: Secondary | ICD-10-CM | POA: Insufficient documentation

## 2019-06-29 DIAGNOSIS — Z8249 Family history of ischemic heart disease and other diseases of the circulatory system: Secondary | ICD-10-CM | POA: Insufficient documentation

## 2019-06-29 DIAGNOSIS — Z853 Personal history of malignant neoplasm of breast: Secondary | ICD-10-CM | POA: Diagnosis not present

## 2019-06-29 HISTORY — DX: Cramp and spasm: R25.2

## 2019-06-29 HISTORY — PX: CALCANEAL OSTEOTOMY: SHX1281

## 2019-06-29 HISTORY — DX: Restless legs syndrome: G25.81

## 2019-06-29 HISTORY — PX: OSTECTOMY: SHX6439

## 2019-06-29 HISTORY — PX: GASTROC RECESSION EXTREMITY: SHX6262

## 2019-06-29 HISTORY — DX: Anesthesia of skin: R20.0

## 2019-06-29 HISTORY — DX: Unspecified urinary incontinence: R32

## 2019-06-29 HISTORY — DX: Unspecified osteoarthritis, unspecified site: M19.90

## 2019-06-29 HISTORY — DX: Insomnia, unspecified: G47.00

## 2019-06-29 HISTORY — DX: Dizziness and giddiness: R42

## 2019-06-29 LAB — GLUCOSE, CAPILLARY
Glucose-Capillary: 168 mg/dL — ABNORMAL HIGH (ref 70–99)
Glucose-Capillary: 111 mg/dL — ABNORMAL HIGH (ref 70–99)

## 2019-06-29 SURGERY — RECESSION, TENDON, GASTROCNEMIUS
Anesthesia: Regional | Site: Foot | Laterality: Right

## 2019-06-29 MED ORDER — CLINDAMYCIN PHOSPHATE 900 MG/50ML IV SOLN
900.0000 mg | INTRAVENOUS | Status: AC
  Administered 2019-06-29: 900 mg via INTRAVENOUS

## 2019-06-29 MED ORDER — ONDANSETRON HCL 4 MG/2ML IJ SOLN
INTRAMUSCULAR | Status: DC | PRN
  Administered 2019-06-29: 4 mg via INTRAVENOUS

## 2019-06-29 MED ORDER — LIDOCAINE HCL (CARDIAC) PF 100 MG/5ML IV SOSY
PREFILLED_SYRINGE | INTRAVENOUS | Status: DC | PRN
  Administered 2019-06-29: 30 mg via INTRATRACHEAL

## 2019-06-29 MED ORDER — ENOXAPARIN SODIUM 40 MG/0.4ML ~~LOC~~ SOLN: 40.0000 mg | SUBCUTANEOUS | 0 refills | Status: AC

## 2019-06-29 MED ORDER — OXYCODONE HCL 5 MG/5ML PO SOLN: 5.0000 mg | Freq: Once | ORAL | Status: DC | PRN

## 2019-06-29 MED ORDER — PROPOFOL 10 MG/ML IV BOLUS
INTRAVENOUS | Status: DC | PRN
  Administered 2019-06-29: 50 mg via INTRAVENOUS
  Administered 2019-06-29: 150 mg via INTRAVENOUS

## 2019-06-29 MED ORDER — GLYCOPYRROLATE 0.2 MG/ML IJ SOLN
INTRAMUSCULAR | Status: DC | PRN
  Administered 2019-06-29: .1 mg via INTRAVENOUS

## 2019-06-29 MED ORDER — ROPIVACAINE HCL 5 MG/ML IJ SOLN
INTRAMUSCULAR | Status: DC | PRN
  Administered 2019-06-29: 50 mL via PERINEURAL

## 2019-06-29 MED ORDER — SULFAMETHOXAZOLE-TRIMETHOPRIM 800-160 MG PO TABS: 1.0000 | ORAL_TABLET | Freq: Two times a day (BID) | ORAL | 0 refills | Status: AC

## 2019-06-29 MED ORDER — OXYCODONE-ACETAMINOPHEN 7.5-325 MG PO TABS: 1.0000 | ORAL_TABLET | Freq: Four times a day (QID) | ORAL | 0 refills | Status: AC | PRN

## 2019-06-29 MED ORDER — ONDANSETRON HCL 4 MG PO TABS: 4.0000 mg | ORAL_TABLET | Freq: Three times a day (TID) | ORAL | 0 refills | Status: AC | PRN

## 2019-06-29 MED ORDER — OXYCODONE HCL 5 MG PO TABS: 5.0000 mg | ORAL_TABLET | Freq: Once | ORAL | Status: DC | PRN

## 2019-06-29 MED ORDER — DEXAMETHASONE SODIUM PHOSPHATE 4 MG/ML IJ SOLN
INTRAMUSCULAR | Status: DC | PRN
  Administered 2019-06-29: 4 mg via INTRAVENOUS

## 2019-06-29 MED ORDER — FENTANYL CITRATE (PF) 100 MCG/2ML IJ SOLN
INTRAMUSCULAR | Status: DC | PRN
  Administered 2019-06-29: 50 ug via INTRAVENOUS
  Administered 2019-06-29: 100 ug via INTRAVENOUS

## 2019-06-29 MED ORDER — SUCCINYLCHOLINE CHLORIDE 20 MG/ML IJ SOLN
INTRAMUSCULAR | Status: DC | PRN
  Administered 2019-06-29: 110 mg via INTRAVENOUS

## 2019-06-29 MED ORDER — LACTATED RINGERS IV SOLN: INTRAVENOUS | Status: DC | PRN

## 2019-06-29 MED ORDER — FENTANYL CITRATE (PF) 100 MCG/2ML IJ SOLN: 25.0000 ug | INTRAMUSCULAR | Status: DC | PRN

## 2019-06-29 MED ORDER — MIDAZOLAM HCL 2 MG/2ML IJ SOLN
INTRAMUSCULAR | Status: DC | PRN
  Administered 2019-06-29: 2 mg via INTRAVENOUS

## 2019-06-29 MED ORDER — POVIDONE-IODINE 7.5 % EX SOLN: Freq: Once | CUTANEOUS | Status: AC

## 2019-06-29 SURGICAL SUPPLY — 40 items
ANCHOR JUGGERKNOT WTAP NDL 2.9 (Anchor) ×3 IMPLANT
ANCHOR SUT QUATTRO KNTLS 4.5 (Anchor) ×2 IMPLANT
BIT DRILL JUGRKNT W/NDL BIT2.9 (DRILL) IMPLANT
BLADE MED AGGRESSIVE (BLADE) ×1 IMPLANT
BLADE SURG 15 STRL LF DISP TIS (BLADE) IMPLANT
BLADE SURG 15 STRL SS (BLADE) ×2
BNDG ELASTIC 4X5.8 VLCR NS LF (GAUZE/BANDAGES/DRESSINGS) ×2 IMPLANT
BNDG ELASTIC 6X5.8 VLCR NS LF (GAUZE/BANDAGES/DRESSINGS) ×2 IMPLANT
BNDG ESMARK 4X12 TAN STRL LF (GAUZE/BANDAGES/DRESSINGS) ×2 IMPLANT
BNDG GAUZE 4.5X4.1 6PLY STRL (MISCELLANEOUS) ×2 IMPLANT
CANISTER SUCT 1200ML W/VALVE (MISCELLANEOUS) ×2 IMPLANT
COVER LIGHT HANDLE FLEXIBLE (MISCELLANEOUS) ×4 IMPLANT
CUFF TOURN SGL QUICK 34 (TOURNIQUET CUFF) ×1
CUFF TRNQT CYL 34X4.125X (TOURNIQUET CUFF) IMPLANT
DRAPE FLUOR MINI C-ARM 54X84 (DRAPES) ×2 IMPLANT
DRILL JUGGERKNOT W/NDL BIT 2.9 (DRILL) ×2
DURAPREP 26ML APPLICATOR (WOUND CARE) ×2 IMPLANT
ELECT REM PT RETURN 9FT ADLT (ELECTROSURGICAL) ×2
ELECTRODE REM PT RTRN 9FT ADLT (ELECTROSURGICAL) ×1 IMPLANT
GAUZE 4X4 16PLY RFD (DISPOSABLE) ×1 IMPLANT
GAUZE SPONGE 4X4 12PLY STRL (GAUZE/BANDAGES/DRESSINGS) ×2 IMPLANT
GAUZE XEROFORM 1X8 LF (GAUZE/BANDAGES/DRESSINGS) ×2 IMPLANT
GLOVE BIO SURGEON STRL SZ7 (GLOVE) ×3 IMPLANT
GLOVE BIOGEL PI IND STRL 7.0 (GLOVE) ×1 IMPLANT
GLOVE BIOGEL PI INDICATOR 7.0 (GLOVE) ×1
GOWN STRL REUS W/ TWL LRG LVL3 (GOWN DISPOSABLE) ×2 IMPLANT
GOWN STRL REUS W/TWL LRG LVL3 (GOWN DISPOSABLE) ×2
KIT TURNOVER KIT A (KITS) ×2 IMPLANT
NS IRRIG 500ML POUR BTL (IV SOLUTION) ×2 IMPLANT
PACK EXTREMITY ARMC (MISCELLANEOUS) ×2 IMPLANT
PADDING CAST BLEND 4X4 NS (MISCELLANEOUS) ×6 IMPLANT
PENCIL SMOKE EVACUATOR (MISCELLANEOUS) ×2 IMPLANT
SPLINT CAST 1 STEP 4X30 (MISCELLANEOUS) ×2 IMPLANT
STOCKINETTE IMPERVIOUS LG (DRAPES) ×2 IMPLANT
SUT ETHILON 3-0 FS-10 30 BLK (SUTURE) ×4
SUT VIC AB 2-0 SH 27 (SUTURE) ×1
SUT VIC AB 2-0 SH 27XBRD (SUTURE) IMPLANT
SUT VIC AB 3-0 SH 27 (SUTURE) ×2
SUT VIC AB 3-0 SH 27X BRD (SUTURE) IMPLANT
SUTURE EHLN 3-0 FS-10 30 BLK (SUTURE) IMPLANT

## 2019-06-29 NOTE — Addendum Note (Signed)
Addendum  created 06/29/19 1604 by Carlos American, MD   Review and Sign - Ready for Procedure, Review and Sign - Signed

## 2019-06-29 NOTE — Anesthesia Procedure Notes (Signed)
Anesthesia Regional Block: Popliteal block   Pre-Anesthetic Checklist: ,, timeout performed, Correct Patient, Correct Site, Correct Laterality, Correct Procedure, Correct Position, risks and benefits discussed, surgical consent, pre-op evaluation,  At surgeon's request and post-op pain management  Laterality: Right  Prep: chloraprep       Needles:  Injection technique: Single-shot  Needle Type: Stimiplex     Needle Length: 9cm  Needle Gauge: 21     Additional Needles:   Procedures:,,,, ultrasound used (permanent image in chart),,,,  Narrative:  Start time: 06/29/2019 10:22 AM End time: 06/29/2019 10:34 AM Injection made incrementally with aspirations every 5 mL.  Performed by: Personally  Anesthesiologist: Carlos American, MD

## 2019-06-29 NOTE — Anesthesia Procedure Notes (Signed)
Anesthesia Regional Block: Adductor canal block   Pre-Anesthetic Checklist: ,, timeout performed, Correct Patient, Correct Site, Correct Laterality, Correct Procedure, Correct Position, risks and benefits discussed, surgical consent, pre-op evaluation,  At surgeon's request and post-op pain management  Laterality: Right  Prep: chloraprep       Needles:  Injection technique: Single-shot  Needle Type: Stimiplex     Needle Length: 9cm  Needle Gauge: 21     Additional Needles:   Procedures:,,,, ultrasound used (permanent image in chart),,,,  Narrative:  Start time: 06/29/2019 10:22 AM End time: 06/29/2019 10:34 AM Injection made incrementally with aspirations every 5 mL.  Performed by: Personally  Anesthesiologist: Carlos American, MD

## 2019-06-29 NOTE — Discharge Instructions (Signed)
[place] DR. TROXLER, DR. Vickki Muff, AND DR. Rosholt   1. Take your medication as prescribed.  Pain medication should be taken only as needed.  Take 1 Percocet every 6 hours.  If you are still continue to have pain you may take ibuprofen or Tylenol between doses.  If you still continue to have pain then it is okay to take 2 Percocets every 6 hours along with Tylenol and ibuprofen between doses.  If still continue to have pain then it is okay to take 1 Percocet every 4 hours.  Please try to take pain medication as minimally as possible.  2. Keep the dressing clean, dry and intact.  3. Keep your foot elevated above the heart level for the first 48 hours.  Maintain nonweightbearing at all times to the right lower extremity.  4. Walking to the bathroom and brief periods of walking are acceptable, unless we have instructed you to be non-weight bearing.  5. Always wear your post-op shoe when walking.  Always use your crutches if you are to be non-weight bearing.  6. Do not take a shower. Baths are permissible as long as the foot is kept out of the water.   7. Every hour you are awake:  - Bend your knee 15 times. - Flex foot 15 times - Massage calf 15 times  8. Call [facility] [phone]) if any of the following problems occur: - You develop a temperature or fever. - The bandage becomes saturated with blood. - Medication does not stop your pain. - Injury of the foot occurs. - Any symptoms of infection including redness, odor, or red streaks running from wound.   General Anesthesia, Adult, Care After This sheet gives you information about how to care for yourself after your procedure. Your health care provider may also give you more specific instructions. If you have problems or questions, contact your health care provider. What can I expect after the procedure? After the  procedure, the following side effects are common:  Pain or discomfort at the IV site.  Nausea.  Vomiting.  Sore throat.  Trouble concentrating.  Feeling cold or chills.  Weak or tired.  Sleepiness and fatigue.  Soreness and body aches. These side effects can affect parts of the body that were not involved in surgery. Follow these instructions at home:  For at least 24 hours after the procedure:  Have a responsible adult stay with you. It is important to have someone help care for you until you are awake and alert.  Rest as needed.  Do not: ? Participate in activities in which you could fall or become injured. ? Drive. ? Use heavy machinery. ? Drink alcohol. ? Take sleeping pills or medicines that cause drowsiness. ? Make important decisions or sign legal documents. ? Take care of children on your own. Eating and drinking  Follow any instructions from your health care provider about eating or drinking restrictions.  When you feel hungry, start by eating small amounts of foods that are soft and easy to digest (bland), such as toast. Gradually return to your regular diet.  Drink enough fluid to keep your urine pale yellow.  If you vomit, rehydrate by drinking water, juice, or clear broth. General instructions  If you have sleep apnea, surgery and certain medicines can increase your risk for breathing problems. Follow instructions from your health care provider about wearing your sleep device: ? Anytime  you are sleeping, including during daytime naps. ? While taking prescription pain medicines, sleeping medicines, or medicines that make you drowsy.  Return to your normal activities as told by your health care provider. Ask your health care provider what activities are safe for you.  Take over-the-counter and prescription medicines only as told by your health care provider.  If you smoke, do not smoke without supervision.  Keep all follow-up visits as told by your  health care provider. This is important. Contact a health care provider if:  You have nausea or vomiting that does not get better with medicine.  You cannot eat or drink without vomiting.  You have pain that does not get better with medicine.  You are unable to pass urine.  You develop a skin rash.  You have a fever.  You have redness around your IV site that gets worse. Get help right away if:  You have difficulty breathing.  You have chest pain.  You have blood in your urine or stool, or you vomit blood. Summary  After the procedure, it is common to have a sore throat or nausea. It is also common to feel tired.  Have a responsible adult stay with you for the first 24 hours after general anesthesia. It is important to have someone help care for you until you are awake and alert.  When you feel hungry, start by eating small amounts of foods that are soft and easy to digest (bland), such as toast. Gradually return to your regular diet.  Drink enough fluid to keep your urine pale yellow.  Return to your normal activities as told by your health care provider. Ask your health care provider what activities are safe for you. This information is not intended to replace advice given to you by your health care provider. Make sure you discuss any questions you have with your health care provider. Document Released: 09/30/2000 Document Revised: 06/27/2017 Document Reviewed: 02/07/2017 Elsevier Patient Education  2020 [employer].

## 2019-06-29 NOTE — Transfer of Care (Signed)
Immediate Anesthesia Transfer of Care Note  Patient: Marisa Hogan  Procedure(s) Performed: GASTROC RECESSION RIGHT (Right Foot) HAGLUNDS/RETROCALCANEAL OSTECTOMY (Right Foot) CALCANEAL OSTEOTOMY (Right Foot)  Patient Location: PACU  Anesthesia Type: No value filed.  Level of Consciousness: awake, alert  and patient cooperative  Airway and Oxygen Therapy: Patient Spontanous Breathing and Patient connected to supplemental oxygen  Post-op Assessment: Post-op Vital signs reviewed, Patient's Cardiovascular Status Stable, Respiratory Function Stable, Patent Airway and No signs of Nausea or vomiting  Post-op Vital Signs: Reviewed and stable  Complications: No apparent anesthesia complications

## 2019-06-29 NOTE — Anesthesia Postprocedure Evaluation (Signed)
Anesthesia Post Note  Patient: Marisa Hogan  Procedure(s) Performed: GASTROC RECESSION RIGHT (Right Foot) HAGLUNDS/RETROCALCANEAL OSTECTOMY (Right Foot) CALCANEAL OSTEOTOMY (Right Foot)     Patient location during evaluation: PACU Anesthesia Type: General Level of consciousness: awake and alert Pain management: pain level controlled Vital Signs Assessment: post-procedure vital signs reviewed and stable Respiratory status: spontaneous breathing, nonlabored ventilation, respiratory function stable and patient connected to nasal cannula oxygen Cardiovascular status: blood pressure returned to baseline and stable Postop Assessment: no apparent nausea or vomiting Anesthetic complications: no    Wanda Plump Ruthanne Mcneish

## 2019-06-29 NOTE — Op Note (Signed)
PODIATRY / FOOT AND ANKLE SURGERY OPERATIVE REPORT    SURGEON: Caroline More, DPM  PRE-OPERATIVE DIAGNOSIS: 1.  Insertional Achilles tendinitis with tendinosis right 2.  Calcaneal spur posterior right 3.  Haglund's deformity right 4.  Equinus contracture right  POST-OPERATIVE DIAGNOSIS: Same  PROCEDURE(S): 1. Right posterior heel spur and Haglund's resection with detachment and reattachment of Achilles tendon 2. Debridement of Achilles tendon right 3. Gastroc recession right  HEMOSTASIS: Right thigh tourniquet  ANESTHESIA: general, preop popliteal and saphenous nerve block  ESTIMATED BLOOD LOSS: 10 cc  FINDING(S): 1.  Large posterior heel spur and Achilles tendinosis 2.  Inflamed retrocalcaneal bursitis 3.  Haglund's deformity 4.  Equinus  PATHOLOGY/SPECIMEN(S): None taken  INDICATIONS:   Shreenika Strelow is a 53 y.o. female who presents with posterior right heel pain at the insertion of the Achilles tendon.  Patient has been treated for significant period of time for this particular issue.  Patient's been treated at [facility] for this condition and was seen by [facility] by Dr Roland Rack.  Patient was referred for potential surgical interventions for right posterior heel pain.  Patient states that she is exhausted all conservative therapies at this time consisting of changes in shoe gear, PT, injections, boot applications, heel lifts, and changes in shoe gear and would like to proceed with surgical intervention at this time.  All treatment options were discussed with the patient both conservative and surgical attempts at correction including potential risks and complications of surgical intervention.  At this time the patient has elected for surgical intervention consisting of right Haglund's resection with posterior heel spur resection, detachment and reattachment of Achilles tendon with debridement of Achilles tendon, and gastroc recession..  DESCRIPTION: After obtaining full informed  written consent, the patient was brought back to the operating room and placed supine upon the operating table.  The patient received IV antibiotics prior to induction.  A preop popliteal and saphenous nerve block was performed by anesthesia.  After obtaining adequate anesthesia, the patient was prepped and draped in the standard fashion.  An Esmarch bandage was used to exsanguinate the right lower extremity and the pneumatic thigh tourniquet was inflated.  Attention was then directed to the posterior aspect of the right leg at the junction just below the gastroc and soleus complex where a linear longitudinal incision was made that was approximately 3 cm in length.  The incision was deepened to the subcutaneous tissues using blunt dissection care was taken to identify and retract all vital neurovascular structures and all venous contributories were cauterized as necessary.  The sural nerve as well as the small saphenous vein were identified and retracted throughout the remainder the case.  A sharp incision was then made into the deep fascia overlying the gastroc aponeurosis and once the fascia was divided the gastroc aponeurosis was able to be identified.  The gastroc aponeurosis was then transected from medial to lateral in its entirety while holding the foot in a maximally dorsiflexed position.  The plantaris was also identified and transected and the ankle joint dorsiflexion appeared to be improved to approximately 10 degrees with the knee flexed and extended.  The surgical site was flushed with copious amounts normal sterile saline.  The deep fascia was reapproximated well coapted with 3-0 Vicryl, the subcutaneous tissues were approximated well coapted with 4-0 Vicryl, the skin was then reapproximated well coapted with 4-0 nylon horizontal mattress type stitching.  Attention was then directed to the posterior aspect of the right heel at the  level of the Haglund's deformity and large posterior heel spur  presents.  The incision was made over this area at the insertion of the Achilles tendon on the calcaneus.  The incision was deepened to the subcutaneous tissues utilizing sharp and blunt dissection and care was taken to identify and retract all vital neurovascular structures and all venous contributories were cauterized as necessary.  At this time the peritenon was identified and incision was made into the peritenon it was separated from the Achilles tendon at the level of its insertion onto the calcaneus.  A sharp incision was then made into the central aspect of the Achilles tendon dividing the tendon into 2 splits and then teed at the level of the insertion.  The Achilles tendon was then reflected in a T type manner off of the calcaneus thereby exposing the posterior insertional heel spur as well as Haglund's deformity.  The retrocalcaneal bursa was identified and resected and passed off in the operative site.  The surgical site was flushed with copious amounts normal saline at that time.  The Achilles tendon appeared to be very bulky at the insertion so portion of the Achilles tendon was transected to debulk the area.  The calcific tendon at the posterior aspect of the heel at the insertion to the Achilles was resected and passed off in the operative site.  Sagittal bone saw was then used to resect the Haglund's deformity as well as the large posterior insertional heel spur.  Both of these bony fragments were then passed off in the operative site.  Any rough edges that were present were then contoured with the paddle rasp as well as ronguer.  C-arm imaging was used to verify proper resection of the heel spur as well as Haglund's deformity which appeared to be excellent as the heel appeared to have a normal contour at this time compared to the preoperative state.  Two 2.9 mm Biomet Zimmer suture anchors were placed at the insertion point of the Achilles tendon from the area of the resected Haglund's deformity.   Utilizing standard AO principles and techniques the suture anchors were placed utilizing manufactures protocol.  The suture anchors appeared to sit excellently.  The suture ends were then placed through the Achilles tendon from posterior to anterior, 2 limbs of the suture anchor placed through the medial portion of the tendon and 2 limbs were placed through the lateral portion of the tendon for a total of 4 suture strands.  The foot was then held in a neutral position and the suture ends were then tied reconnecting the Achilles tendon to the calcaneus for one-point fixation.  Distal drill holes were then made for the 2x 4.5 Quatro anchors from [employer]  One suture in from the medial slip and one suture in from the lateral slip were then placed through the [facility] anchor and it was placed then into the distal hole into the posterior aspect of the calcaneus while under the appropriate tension.  The suture anchor was then seated and excellent seating was noted.  The same procedure was then performed with the other suture ends using a second Quatro anchor.  Excellent seating was noted.  A small amount of the tendon was then debulked at that time but it appeared to be sitting in a flat surface overall.  The Achilles tendon appeared to be well positioned and reapproximated at the posterior aspect of the calcaneus and neutral 90 degree position.  The surgical site was flushed with copious amounts  normal sterile saline.  The tendon split was then reinforced with 2-0 Vicryl along with the small portion of the FiberWire type suture from the Biomet Zimmer suture anchor that still remains.  The distal end of the Achilles tendon was also reinforced back to its insertion point onto the posterior aspect of the calcaneus with 2-0 Vicryl.  The peritenon was then reapproximated well coapted with 3-0 Vicryl.  The subcutaneous tissue was reapproximated well coapted with 4-0 Vicryl and the skin was then reapproximated well coapted  with 4-0 nylon horizontal mattress type stitching.  Postoperative dressing was applied consisting of Xeroform to both incision sites followed by 4 x 4 gauze, Kerlix, Kling, web roll, posterior splint, Ace wrap.  The pneumatic thigh tourniquet was deflated and a prompt hyperemic response was noted all digits of the right foot.  Is important to note that the patient tolerated the procedure well and was transferred to recovery room vital signs stable vascular status intact to all toes of the right foot.  Following.  Postoperative monitoring the patient be discharged home the following written oral postop instructions: Keep surgical dressings clean, dry, and intact until postoperative visit, ice and elevate right lower extremity when at rest, take postop pain medication antibiotics as well as antinausea medicine and Lovenox blood thinning medication as prescribed, remain nonweightbearing to the right lower extremity all times, follow-up in clinic 1 week after procedure date  COMPLICATIONS: None  CONDITION: Good, stable  Caroline More, DPM

## 2019-06-29 NOTE — Anesthesia Procedure Notes (Signed)
Procedure Name: Intubation Date/Time: 06/29/2019 11:33 AM Performed by: Cameron Ali, CRNA Pre-anesthesia Checklist: Patient identified, Emergency Drugs available, Suction available, Patient being monitored and Timeout performed Patient Re-evaluated:Patient Re-evaluated prior to induction Oxygen Delivery Method: Circle system utilized Preoxygenation: Pre-oxygenation with 100% oxygen Induction Type: IV induction Ventilation: Mask ventilation without difficulty Laryngoscope Size: Mac and 3 Grade View: Grade I Tube type: Oral Tube size: 7.5 mm Number of attempts: 1 Placement Confirmation: ETT inserted through vocal cords under direct vision,  positive ETCO2 and breath sounds checked- equal and bilateral Tube secured with: Tape Dental Injury: Teeth and Oropharynx as per pre-operative assessment

## 2019-06-30 ENCOUNTER — Encounter: Payer: Self-pay | Admitting: *Deleted

## 2019-09-05 DIAGNOSIS — E119 Type 2 diabetes mellitus without complications: Principal | ICD-10-CM

## 2019-09-06 MED ORDER — ATORVASTATIN 10 MG TABLET
ORAL_TABLET | Freq: Every day | ORAL | 0 refills | 90.00000 days | Status: CP
Start: 2019-09-06 — End: 2020-09-05

## 2019-09-06 MED ORDER — METFORMIN ER 500 MG TABLET,EXTENDED RELEASE 24 HR
ORAL_TABLET | Freq: Every day | ORAL | 0 refills | 90 days | Status: CP
Start: 2019-09-06 — End: ?

## 2019-10-04 MED ORDER — VENLAFAXINE 75 MG TABLET
ORAL_TABLET | 0 refills | 0 days | Status: CP
Start: 2019-10-04 — End: ?

## 2019-10-13 MED ORDER — TRAZODONE 50 MG TABLET
ORAL_TABLET | 3 refills | 0 days | Status: CP
Start: 2019-10-13 — End: ?

## 2019-10-15 ENCOUNTER — Ambulatory Visit: Admit: 2019-10-15 | Discharge: 2019-10-16 | Payer: PRIVATE HEALTH INSURANCE

## 2019-10-15 DIAGNOSIS — R202 Paresthesia of skin: Principal | ICD-10-CM

## 2019-10-15 DIAGNOSIS — E119 Type 2 diabetes mellitus without complications: Principal | ICD-10-CM

## 2019-10-15 DIAGNOSIS — Z1239 Encounter for other screening for malignant neoplasm of breast: Principal | ICD-10-CM

## 2019-10-15 DIAGNOSIS — Z1159 Encounter for screening for other viral diseases: Principal | ICD-10-CM

## 2019-10-15 DIAGNOSIS — R5383 Other fatigue: Principal | ICD-10-CM

## 2019-10-15 DIAGNOSIS — Z0000 Encounter for general adult medical examination without abnormal findings: Principal | ICD-10-CM

## 2019-10-15 MED ORDER — BUPROPION HCL XL 150 MG 24 HR TABLET, EXTENDED RELEASE
ORAL_TABLET | Freq: Every morning | ORAL | 2 refills | 30 days | Status: CP
Start: 2019-10-15 — End: 2020-10-14

## 2019-10-15 MED ORDER — GLIPIZIDE 5 MG TABLET
ORAL_TABLET | Freq: Two times a day (BID) | ORAL | 5 refills | 30 days | Status: CP
Start: 2019-10-15 — End: ?

## 2019-10-15 MED ORDER — ALBUTEROL SULFATE HFA 90 MCG/ACTUATION AEROSOL INHALER
Freq: Four times a day (QID) | RESPIRATORY_TRACT | 5 refills | 0.00000 days | Status: CP | PRN
Start: 2019-10-15 — End: ?

## 2019-10-15 MED ORDER — CYCLOBENZAPRINE 10 MG TABLET
ORAL_TABLET | Freq: Three times a day (TID) | ORAL | 5 refills | 30.00000 days | Status: CP
Start: 2019-10-15 — End: ?

## 2019-12-03 DIAGNOSIS — E119 Type 2 diabetes mellitus without complications: Principal | ICD-10-CM

## 2019-12-03 MED ORDER — ATORVASTATIN 10 MG TABLET
ORAL_TABLET | Freq: Every day | ORAL | 0 refills | 90 days | Status: CP
Start: 2019-12-03 — End: 2020-12-02

## 2019-12-03 MED ORDER — METFORMIN ER 500 MG TABLET,EXTENDED RELEASE 24 HR
ORAL_TABLET | Freq: Every day | ORAL | 0 refills | 90 days | Status: CP
Start: 2019-12-03 — End: ?

## 2019-12-07 MED ORDER — BUPROPION HCL XL 150 MG 24 HR TABLET, EXTENDED RELEASE
ORAL_TABLET | 1 refills | 0 days | Status: CP
Start: 2019-12-07 — End: ?

## 2020-01-04 MED ORDER — VENLAFAXINE 75 MG TABLET
ORAL_TABLET | 1 refills | 0 days | Status: CP
Start: 2020-01-04 — End: ?

## 2020-01-14 ENCOUNTER — Ambulatory Visit: Admit: 2020-01-14 | Discharge: 2020-01-15 | Payer: PRIVATE HEALTH INSURANCE

## 2020-01-14 DIAGNOSIS — E119 Type 2 diabetes mellitus without complications: Principal | ICD-10-CM

## 2020-01-15 MED ORDER — METFORMIN ER 500 MG TABLET,EXTENDED RELEASE 24 HR
ORAL_TABLET | Freq: Every day | ORAL | 1 refills | 90 days | Status: CP
Start: 2020-01-15 — End: ?

## 2020-01-15 MED ORDER — ATORVASTATIN 10 MG TABLET
ORAL_TABLET | Freq: Every day | ORAL | 1 refills | 90 days | Status: CP
Start: 2020-01-15 — End: 2021-01-14

## 2020-01-18 DIAGNOSIS — Z86000 Personal history of in-situ neoplasm of breast: Principal | ICD-10-CM

## 2020-01-21 ENCOUNTER — Other Ambulatory Visit: Payer: Self-pay | Admitting: Family Medicine

## 2020-01-21 DIAGNOSIS — D0501 Lobular carcinoma in situ of right breast: Secondary | ICD-10-CM

## 2020-02-08 ENCOUNTER — Ambulatory Visit
Admission: RE | Admit: 2020-02-08 | Discharge: 2020-02-08 | Disposition: A | Discharge status: Home / Self Care | Payer: BC Managed Care – PPO | Source: Ambulatory Visit | Attending: Family Medicine | Admitting: Family Medicine

## 2020-02-08 DIAGNOSIS — D0501 Lobular carcinoma in situ of right breast: Secondary | ICD-10-CM | POA: Insufficient documentation

## 2020-04-10 MED ORDER — GLIPIZIDE 5 MG TABLET
ORAL_TABLET | Freq: Two times a day (BID) | ORAL | 1 refills | 90.00000 days | Status: CP
Start: 2020-04-10 — End: ?

## 2020-05-03 ENCOUNTER — Ambulatory Visit: Admit: 2020-05-03 | Discharge: 2020-05-04 | Payer: PRIVATE HEALTH INSURANCE

## 2020-07-06 MED ORDER — VENLAFAXINE 75 MG TABLET
ORAL_TABLET | 0 refills | 0 days | Status: CP
Start: 2020-07-06 — End: 2020-07-28

## 2020-07-06 MED ORDER — BUPROPION HCL XL 150 MG 24 HR TABLET, EXTENDED RELEASE
ORAL_TABLET | 0 refills | 0 days | Status: CP
Start: 2020-07-06 — End: 2020-07-28

## 2020-07-28 ENCOUNTER — Ambulatory Visit: Admit: 2020-07-28 | Discharge: 2020-07-29 | Payer: PRIVATE HEALTH INSURANCE

## 2020-07-28 DIAGNOSIS — Z79899 Other long term (current) drug therapy: Principal | ICD-10-CM

## 2020-07-28 DIAGNOSIS — G2581 Restless legs syndrome: Principal | ICD-10-CM

## 2020-07-28 DIAGNOSIS — E119 Type 2 diabetes mellitus without complications: Principal | ICD-10-CM

## 2020-07-28 DIAGNOSIS — R5383 Other fatigue: Principal | ICD-10-CM

## 2020-07-28 MED ORDER — VENLAFAXINE 75 MG TABLET
ORAL_TABLET | Freq: Every day | ORAL | 3 refills | 90 days | Status: CP
Start: 2020-07-28 — End: ?

## 2020-07-28 MED ORDER — BUPROPION HCL XL 150 MG 24 HR TABLET, EXTENDED RELEASE
ORAL_TABLET | Freq: Every morning | ORAL | 3 refills | 90 days | Status: CP
Start: 2020-07-28 — End: ?

## 2020-07-28 MED ORDER — OZEMPIC 0.25 MG OR 0.5 MG (2 MG/1.5 ML) SUBCUTANEOUS PEN INJECTOR
SUBCUTANEOUS | 3 refills | 84 days | Status: CP
Start: 2020-07-28 — End: 2020-10-26

## 2020-07-28 MED ORDER — ATORVASTATIN 10 MG TABLET
ORAL_TABLET | Freq: Every day | ORAL | 1 refills | 90 days | Status: CP
Start: 2020-07-28 — End: 2021-07-28

## 2020-08-28 MED ORDER — METFORMIN ER 500 MG TABLET,EXTENDED RELEASE 24 HR
ORAL_TABLET | Freq: Every day | ORAL | 1 refills | 90 days | Status: CP
Start: 2020-08-28 — End: ?

## 2020-09-30 MED ORDER — TRAZODONE 50 MG TABLET
ORAL_TABLET | 3 refills | 0 days | Status: CP
Start: 2020-09-30 — End: ?

## 2020-11-09 ENCOUNTER — Ambulatory Visit: Admit: 2020-11-09 | Discharge: 2020-11-10 | Payer: PRIVATE HEALTH INSURANCE

## 2020-11-09 DIAGNOSIS — R4789 Other speech disturbances: Principal | ICD-10-CM

## 2020-11-09 DIAGNOSIS — D696 Thrombocytopenia, unspecified: Principal | ICD-10-CM

## 2020-11-09 DIAGNOSIS — R5383 Other fatigue: Principal | ICD-10-CM

## 2020-11-09 DIAGNOSIS — Z13220 Encounter for screening for lipoid disorders: Principal | ICD-10-CM

## 2020-11-09 DIAGNOSIS — R7401 Elevated liver transaminase level: Principal | ICD-10-CM

## 2020-11-09 DIAGNOSIS — G4761 Periodic limb movement disorder: Principal | ICD-10-CM

## 2020-11-09 DIAGNOSIS — R252 Cramp and spasm: Principal | ICD-10-CM

## 2020-11-09 DIAGNOSIS — R413 Other amnesia: Principal | ICD-10-CM

## 2020-11-09 DIAGNOSIS — Z79899 Other long term (current) drug therapy: Principal | ICD-10-CM

## 2020-11-09 DIAGNOSIS — Z1231 Encounter for screening mammogram for malignant neoplasm of breast: Principal | ICD-10-CM

## 2020-11-09 DIAGNOSIS — Z86000 Personal history of in-situ neoplasm of breast: Principal | ICD-10-CM

## 2020-11-09 DIAGNOSIS — E119 Type 2 diabetes mellitus without complications: Principal | ICD-10-CM

## 2020-11-09 MED ORDER — ROPINIROLE 0.25 MG TABLET
ORAL_TABLET | Freq: Two times a day (BID) | ORAL | 11 refills | 30 days | Status: CP
Start: 2020-11-09 — End: 2021-11-09

## 2020-11-13 ENCOUNTER — Other Ambulatory Visit: Payer: Self-pay | Admitting: Family Medicine

## 2020-11-13 DIAGNOSIS — Z1231 Encounter for screening mammogram for malignant neoplasm of breast: Secondary | ICD-10-CM

## 2020-11-24 ENCOUNTER — Ambulatory Visit: Admit: 2020-11-24 | Discharge: 2020-11-26 | Payer: PRIVATE HEALTH INSURANCE

## 2020-11-28 MED ORDER — METFORMIN ER 500 MG TABLET,EXTENDED RELEASE 24 HR
ORAL_TABLET | Freq: Every day | ORAL | 0 refills | 90.00000 days | Status: CP
Start: 2020-11-28 — End: ?

## 2021-03-07 MED ORDER — ATORVASTATIN 10 MG TABLET
ORAL_TABLET | Freq: Every day | ORAL | 0 refills | 90 days | Status: CP
Start: 2021-03-07 — End: 2022-03-07

## 2021-04-06 MED ORDER — METFORMIN ER 500 MG TABLET,EXTENDED RELEASE 24 HR
ORAL_TABLET | Freq: Every day | ORAL | 0 refills | 90 days | Status: CP
Start: 2021-04-06 — End: ?

## 2021-04-06 MED ORDER — ATORVASTATIN 10 MG TABLET
ORAL_TABLET | Freq: Every day | ORAL | 0 refills | 90 days | Status: CP
Start: 2021-04-06 — End: 2022-04-06

## 2021-04-16 DIAGNOSIS — G3184 Mild cognitive impairment, so stated: Principal | ICD-10-CM

## 2021-04-16 DIAGNOSIS — G2581 Restless legs syndrome: Principal | ICD-10-CM

## 2021-05-03 ENCOUNTER — Ambulatory Visit: Admit: 2021-05-03 | Discharge: 2021-05-04 | Payer: PRIVATE HEALTH INSURANCE

## 2021-05-15 ENCOUNTER — Ambulatory Visit: Admit: 2021-05-15 | Discharge: 2021-05-16 | Payer: PRIVATE HEALTH INSURANCE

## 2021-05-15 DIAGNOSIS — E119 Type 2 diabetes mellitus without complications: Principal | ICD-10-CM

## 2021-05-15 DIAGNOSIS — R413 Other amnesia: Principal | ICD-10-CM

## 2021-05-15 DIAGNOSIS — H6121 Impacted cerumen, right ear: Principal | ICD-10-CM

## 2021-05-15 MED ORDER — BLOOD GLUCOSE TEST STRIPS
ORAL_STRIP | 3 refills | 0 days | Status: CP
Start: 2021-05-15 — End: ?

## 2021-05-15 MED ORDER — BUPROPION HCL XL 150 MG 24 HR TABLET, EXTENDED RELEASE
ORAL_TABLET | Freq: Every morning | ORAL | 3 refills | 90 days | Status: CP
Start: 2021-05-15 — End: ?

## 2021-05-15 MED ORDER — BLOOD-GLUCOSE METER KIT WRAPPER
1 refills | 0 days | Status: CP
Start: 2021-05-15 — End: ?

## 2021-05-15 MED ORDER — OZEMPIC 1 MG/DOSE (4 MG/3 ML) SUBCUTANEOUS PEN INJECTOR
SUBCUTANEOUS | 11 refills | 84.00000 days | Status: CP
Start: 2021-05-15 — End: ?

## 2021-05-15 MED ORDER — LANCETS
3 refills | 0 days | Status: CP
Start: 2021-05-15 — End: ?

## 2021-05-18 ENCOUNTER — Ambulatory Visit: Admit: 2021-05-18 | Discharge: 2021-05-19 | Payer: PRIVATE HEALTH INSURANCE

## 2021-05-18 DIAGNOSIS — Z8709 Personal history of other diseases of the respiratory system: Principal | ICD-10-CM

## 2021-05-18 DIAGNOSIS — R059 Cough, unspecified type: Principal | ICD-10-CM

## 2021-05-18 DIAGNOSIS — R062 Wheezing: Principal | ICD-10-CM

## 2021-05-18 DIAGNOSIS — M791 Myalgia, unspecified site: Principal | ICD-10-CM

## 2021-05-18 MED ORDER — PROMETHAZINE-DM 6.25 MG-15 MG/5 ML ORAL SYRUP
Freq: Three times a day (TID) | ORAL | 0 refills | 6 days | Status: CP | PRN
Start: 2021-05-18 — End: 2021-05-23

## 2021-05-18 MED ORDER — AEROCHAMBER MV SPACER
0 refills | 0 days | Status: CP
Start: 2021-05-18 — End: ?

## 2021-07-06 MED ORDER — ATORVASTATIN 10 MG TABLET
ORAL_TABLET | Freq: Every day | ORAL | 0 refills | 90.00000 days | Status: CP
Start: 2021-07-06 — End: 2021-07-06

## 2021-07-06 MED ORDER — METFORMIN ER 500 MG TABLET,EXTENDED RELEASE 24 HR
ORAL_TABLET | Freq: Every day | ORAL | 1 refills | 90 days | Status: CP
Start: 2021-07-06 — End: ?

## 2021-08-09 MED ORDER — TRAZODONE 50 MG TABLET
ORAL_TABLET | 0 refills | 0 days | Status: CP
Start: 2021-08-09 — End: ?

## 2021-08-09 MED ORDER — VENLAFAXINE 75 MG TABLET
ORAL_TABLET | 0 refills | 0 days | Status: CP
Start: 2021-08-09 — End: ?

## 2021-08-31 ENCOUNTER — Ambulatory Visit: Admit: 2021-08-31 | Discharge: 2021-09-01 | Payer: PRIVATE HEALTH INSURANCE

## 2021-08-31 DIAGNOSIS — R413 Other amnesia: Principal | ICD-10-CM

## 2021-08-31 DIAGNOSIS — Z79899 Other long term (current) drug therapy: Principal | ICD-10-CM

## 2021-08-31 DIAGNOSIS — Z1239 Encounter for other screening for malignant neoplasm of breast: Principal | ICD-10-CM

## 2021-08-31 DIAGNOSIS — E119 Type 2 diabetes mellitus without complications: Principal | ICD-10-CM

## 2021-08-31 DIAGNOSIS — Z1211 Encounter for screening for malignant neoplasm of colon: Principal | ICD-10-CM

## 2021-08-31 MED ORDER — METFORMIN ER 500 MG TABLET,EXTENDED RELEASE 24 HR
ORAL_TABLET | Freq: Every day | ORAL | 1 refills | 90 days | Status: CP
Start: 2021-08-31 — End: ?

## 2021-08-31 MED ORDER — VENLAFAXINE 75 MG TABLET
ORAL_TABLET | Freq: Every day | ORAL | 1 refills | 90 days | Status: CP
Start: 2021-08-31 — End: ?

## 2021-09-10 ENCOUNTER — Other Ambulatory Visit: Payer: Self-pay | Admitting: Family Medicine

## 2021-09-10 DIAGNOSIS — Z1231 Encounter for screening mammogram for malignant neoplasm of breast: Secondary | ICD-10-CM

## 2021-10-23 ENCOUNTER — Ambulatory Visit
Admission: RE | Admit: 2021-10-23 | Discharge: 2021-10-23 | Disposition: A | Discharge status: Home / Self Care | Payer: BC Managed Care – PPO | Source: Ambulatory Visit | Attending: Family Medicine | Admitting: Family Medicine

## 2021-10-23 DIAGNOSIS — Z1231 Encounter for screening mammogram for malignant neoplasm of breast: Secondary | ICD-10-CM | POA: Diagnosis present

## 2021-12-17 MED ORDER — ROPINIROLE 0.25 MG TABLET
ORAL_TABLET | Freq: Two times a day (BID) | ORAL | 1 refills | 90 days | Status: CP
Start: 2021-12-17 — End: 2022-12-17

## 2021-12-28 MED ORDER — TRAZODONE 50 MG TABLET
ORAL_TABLET | 3 refills | 0 days | Status: CP
Start: 2021-12-28 — End: ?

## 2022-02-11 MED ORDER — ATORVASTATIN 10 MG TABLET
ORAL_TABLET | Freq: Every day | ORAL | 3 refills | 90 days | Status: CP
Start: 2022-02-11 — End: 2023-02-11

## 2022-04-09 MED ORDER — OZEMPIC 1 MG/DOSE (4 MG/3 ML) SUBCUTANEOUS PEN INJECTOR
SUBCUTANEOUS | 11 refills | 84 days | Status: CP
Start: 2022-04-09 — End: ?

## 2022-04-16 MED ORDER — TRULICITY 1.5 MG/0.5 ML SUBCUTANEOUS PEN INJECTOR
SUBCUTANEOUS | 3 refills | 84 days | Status: CP
Start: 2022-04-16 — End: ?

## 2022-04-29 MED ORDER — VENLAFAXINE 75 MG TABLET
ORAL_TABLET | Freq: Every day | ORAL | 0 refills | 90 days | Status: CP
Start: 2022-04-29 — End: ?

## 2022-05-08 ENCOUNTER — Ambulatory Visit: Admit: 2022-05-08 | Discharge: 2022-05-09 | Payer: PRIVATE HEALTH INSURANCE

## 2022-05-08 DIAGNOSIS — R251 Tremor, unspecified: Principal | ICD-10-CM

## 2022-05-08 DIAGNOSIS — G629 Polyneuropathy, unspecified: Principal | ICD-10-CM

## 2022-05-08 DIAGNOSIS — Z79899 Other long term (current) drug therapy: Principal | ICD-10-CM

## 2022-05-08 DIAGNOSIS — D696 Thrombocytopenia, unspecified: Principal | ICD-10-CM

## 2022-05-08 DIAGNOSIS — R4189 Other symptoms and signs involving cognitive functions and awareness: Principal | ICD-10-CM

## 2022-05-08 DIAGNOSIS — R252 Cramp and spasm: Principal | ICD-10-CM

## 2022-05-08 DIAGNOSIS — G2581 Restless legs syndrome: Principal | ICD-10-CM

## 2022-05-08 DIAGNOSIS — Z86000 Personal history of in-situ neoplasm of breast: Principal | ICD-10-CM

## 2022-05-08 DIAGNOSIS — R208 Other disturbances of skin sensation: Principal | ICD-10-CM

## 2022-05-08 DIAGNOSIS — R5383 Other fatigue: Principal | ICD-10-CM

## 2022-05-08 DIAGNOSIS — M2550 Pain in unspecified joint: Principal | ICD-10-CM

## 2022-05-08 DIAGNOSIS — M7989 Other specified soft tissue disorders: Principal | ICD-10-CM

## 2022-05-08 DIAGNOSIS — R7401 Elevated liver transaminase level: Principal | ICD-10-CM

## 2022-05-08 DIAGNOSIS — M791 Myalgia, unspecified site: Principal | ICD-10-CM

## 2022-05-08 DIAGNOSIS — R079 Chest pain, unspecified: Principal | ICD-10-CM

## 2022-05-08 DIAGNOSIS — E785 Hyperlipidemia, unspecified: Principal | ICD-10-CM

## 2022-05-08 DIAGNOSIS — E119 Type 2 diabetes mellitus without complications: Principal | ICD-10-CM

## 2022-05-08 MED ORDER — DULOXETINE 20 MG CAPSULE,DELAYED RELEASE
ORAL_CAPSULE | Freq: Every day | ORAL | 1 refills | 90 days | Status: CP
Start: 2022-05-08 — End: 2023-05-08

## 2022-05-08 MED ORDER — METFORMIN ER 500 MG TABLET,EXTENDED RELEASE 24 HR
ORAL_TABLET | Freq: Every day | ORAL | 1 refills | 90 days | Status: CP
Start: 2022-05-08 — End: ?

## 2022-05-08 MED ORDER — FLUTICASONE PROPIONATE 220 MCG/ACTUATION HFA AEROSOL INHALER
Freq: Two times a day (BID) | RESPIRATORY_TRACT | 1 refills | 0 days | Status: CP
Start: 2022-05-08 — End: ?

## 2022-05-08 MED ORDER — ROPINIROLE 0.25 MG TABLET
ORAL_TABLET | Freq: Two times a day (BID) | ORAL | 1 refills | 90 days | Status: CP
Start: 2022-05-08 — End: 2023-05-08

## 2022-05-08 MED ORDER — ALBUTEROL SULFATE HFA 90 MCG/ACTUATION AEROSOL INHALER
Freq: Four times a day (QID) | RESPIRATORY_TRACT | 5 refills | 0 days | Status: CP | PRN
Start: 2022-05-08 — End: ?

## 2022-05-08 MED ORDER — BUPROPION HCL XL 150 MG 24 HR TABLET, EXTENDED RELEASE
ORAL_TABLET | Freq: Every morning | ORAL | 3 refills | 90 days | Status: CP
Start: 2022-05-08 — End: ?

## 2022-05-08 MED ORDER — SUMATRIPTAN 100 MG TABLET
ORAL_TABLET | 11 refills | 0 days | Status: CP
Start: 2022-05-08 — End: ?

## 2022-05-09 ENCOUNTER — Ambulatory Visit: Admit: 2022-05-09 | Discharge: 2022-05-10 | Payer: PRIVATE HEALTH INSURANCE

## 2022-05-09 DIAGNOSIS — M791 Myalgia, unspecified site: Principal | ICD-10-CM

## 2022-05-09 DIAGNOSIS — G629 Polyneuropathy, unspecified: Principal | ICD-10-CM

## 2022-05-09 DIAGNOSIS — R5383 Other fatigue: Principal | ICD-10-CM

## 2022-05-09 DIAGNOSIS — E119 Type 2 diabetes mellitus without complications: Principal | ICD-10-CM

## 2022-05-09 DIAGNOSIS — R7401 Elevated liver transaminase level: Principal | ICD-10-CM

## 2022-05-09 DIAGNOSIS — M7989 Other specified soft tissue disorders: Principal | ICD-10-CM

## 2022-05-09 DIAGNOSIS — E785 Hyperlipidemia, unspecified: Principal | ICD-10-CM

## 2022-05-09 DIAGNOSIS — Z79899 Other long term (current) drug therapy: Principal | ICD-10-CM

## 2022-05-09 DIAGNOSIS — R208 Other disturbances of skin sensation: Principal | ICD-10-CM

## 2022-05-09 DIAGNOSIS — M2550 Pain in unspecified joint: Principal | ICD-10-CM

## 2022-05-09 DIAGNOSIS — R252 Cramp and spasm: Principal | ICD-10-CM

## 2022-05-09 DIAGNOSIS — D696 Thrombocytopenia, unspecified: Principal | ICD-10-CM

## 2022-05-09 DIAGNOSIS — R4189 Other symptoms and signs involving cognitive functions and awareness: Principal | ICD-10-CM

## 2022-05-09 MED ORDER — PULMICORT FLEXHALER 180 MCG/ACTUATION BREATH ACTIVATED
Freq: Two times a day (BID) | RESPIRATORY_TRACT | 3 refills | 90 days | Status: CP
Start: 2022-05-09 — End: 2023-05-09

## 2022-05-14 ENCOUNTER — Ambulatory Visit: Admit: 2022-05-14 | Discharge: 2022-05-15 | Payer: PRIVATE HEALTH INSURANCE

## 2022-05-14 DIAGNOSIS — R748 Abnormal levels of other serum enzymes: Principal | ICD-10-CM

## 2022-05-14 DIAGNOSIS — D696 Thrombocytopenia, unspecified: Principal | ICD-10-CM

## 2022-05-14 DIAGNOSIS — M2550 Pain in unspecified joint: Principal | ICD-10-CM

## 2022-05-14 DIAGNOSIS — R7401 Elevated liver transaminase level: Principal | ICD-10-CM

## 2022-05-14 DIAGNOSIS — G629 Polyneuropathy, unspecified: Principal | ICD-10-CM

## 2022-05-14 DIAGNOSIS — R5383 Other fatigue: Principal | ICD-10-CM

## 2022-05-14 DIAGNOSIS — G44209 Tension-type headache, unspecified, not intractable: Principal | ICD-10-CM

## 2022-05-14 DIAGNOSIS — E119 Type 2 diabetes mellitus without complications: Principal | ICD-10-CM

## 2022-05-14 DIAGNOSIS — R251 Tremor, unspecified: Principal | ICD-10-CM

## 2022-05-14 DIAGNOSIS — R208 Other disturbances of skin sensation: Principal | ICD-10-CM

## 2022-05-14 DIAGNOSIS — E8809 Other disorders of plasma-protein metabolism, not elsewhere classified: Principal | ICD-10-CM

## 2022-05-14 MED ORDER — GABAPENTIN 100 MG CAPSULE
ORAL_CAPSULE | Freq: Every evening | ORAL | 1 refills | 90 days | Status: CP
Start: 2022-05-14 — End: 2023-05-14

## 2022-05-15 DIAGNOSIS — R748 Abnormal levels of other serum enzymes: Principal | ICD-10-CM

## 2022-05-17 ENCOUNTER — Ambulatory Visit: Admit: 2022-05-17 | Discharge: 2022-05-18 | Payer: PRIVATE HEALTH INSURANCE

## 2022-05-17 DIAGNOSIS — R748 Abnormal levels of other serum enzymes: Principal | ICD-10-CM

## 2022-05-17 DIAGNOSIS — E8809 Other disorders of plasma-protein metabolism, not elsewhere classified: Principal | ICD-10-CM

## 2022-05-17 DIAGNOSIS — R7401 Elevated liver transaminase level: Principal | ICD-10-CM

## 2022-05-17 DIAGNOSIS — R5383 Other fatigue: Principal | ICD-10-CM

## 2022-05-17 DIAGNOSIS — D696 Thrombocytopenia, unspecified: Principal | ICD-10-CM

## 2022-05-17 DIAGNOSIS — M2550 Pain in unspecified joint: Principal | ICD-10-CM

## 2022-05-28 ENCOUNTER — Ambulatory Visit: Admit: 2022-05-28 | Discharge: 2022-05-29 | Payer: PRIVATE HEALTH INSURANCE

## 2022-06-11 ENCOUNTER — Ambulatory Visit
Admit: 2022-06-11 | Discharge: 2022-06-12 | Payer: PRIVATE HEALTH INSURANCE | Attending: Student in an Organized Health Care Education/Training Program | Primary: Student in an Organized Health Care Education/Training Program

## 2022-06-11 DIAGNOSIS — R5383 Other fatigue: Principal | ICD-10-CM

## 2022-06-11 DIAGNOSIS — E119 Type 2 diabetes mellitus without complications: Principal | ICD-10-CM

## 2022-06-11 DIAGNOSIS — R600 Localized edema: Principal | ICD-10-CM

## 2022-06-11 DIAGNOSIS — Z8249 Family history of ischemic heart disease and other diseases of the circulatory system: Principal | ICD-10-CM

## 2022-06-11 DIAGNOSIS — R079 Chest pain, unspecified: Principal | ICD-10-CM

## 2022-06-17 ENCOUNTER — Ambulatory Visit: Admit: 2022-06-17 | Discharge: 2022-06-18 | Payer: PRIVATE HEALTH INSURANCE

## 2022-06-17 DIAGNOSIS — R748 Abnormal levels of other serum enzymes: Principal | ICD-10-CM

## 2022-06-17 DIAGNOSIS — E8809 Other disorders of plasma-protein metabolism, not elsewhere classified: Principal | ICD-10-CM

## 2022-06-17 DIAGNOSIS — M2550 Pain in unspecified joint: Principal | ICD-10-CM

## 2022-06-17 DIAGNOSIS — K921 Melena: Principal | ICD-10-CM

## 2022-06-17 DIAGNOSIS — K766 Portal hypertension: Principal | ICD-10-CM

## 2022-06-17 DIAGNOSIS — R7401 Elevated liver transaminase level: Principal | ICD-10-CM

## 2022-06-17 DIAGNOSIS — K7581 Nonalcoholic steatohepatitis (NASH): Principal | ICD-10-CM

## 2022-06-17 DIAGNOSIS — K7460 Unspecified cirrhosis of liver: Principal | ICD-10-CM

## 2022-06-17 DIAGNOSIS — D696 Thrombocytopenia, unspecified: Principal | ICD-10-CM

## 2022-06-17 DIAGNOSIS — K7682 Hepatic encephalopathy (CMS-HCC): Principal | ICD-10-CM

## 2022-06-17 DIAGNOSIS — R5383 Other fatigue: Principal | ICD-10-CM

## 2022-06-17 MED ORDER — CARVEDILOL 6.25 MG TABLET
ORAL_TABLET | Freq: Two times a day (BID) | ORAL | 3 refills | 90 days | Status: CP
Start: 2022-06-17 — End: 2023-06-17

## 2022-06-17 MED ORDER — LACTULOSE 20 GRAM/30 ML ORAL SOLUTION
Freq: Three times a day (TID) | ORAL | 11 refills | 30 days | Status: CP
Start: 2022-06-17 — End: 2023-06-17

## 2022-07-09 ENCOUNTER — Ambulatory Visit: Admit: 2022-07-09 | Discharge: 2022-07-10 | Payer: PRIVATE HEALTH INSURANCE

## 2022-07-09 DIAGNOSIS — Z79899 Other long term (current) drug therapy: Principal | ICD-10-CM

## 2022-07-09 DIAGNOSIS — K766 Portal hypertension: Principal | ICD-10-CM

## 2022-07-09 DIAGNOSIS — R600 Localized edema: Principal | ICD-10-CM

## 2022-07-09 DIAGNOSIS — K7460 Unspecified cirrhosis of liver: Principal | ICD-10-CM

## 2022-07-09 DIAGNOSIS — K5900 Constipation, unspecified: Principal | ICD-10-CM

## 2022-07-09 DIAGNOSIS — K7581 Nonalcoholic steatohepatitis (NASH): Principal | ICD-10-CM

## 2022-07-09 DIAGNOSIS — K760 Fatty (change of) liver, not elsewhere classified: Principal | ICD-10-CM

## 2022-07-09 DIAGNOSIS — E119 Type 2 diabetes mellitus without complications: Principal | ICD-10-CM

## 2022-07-09 DIAGNOSIS — K7682 Hepatic encephalopathy (CMS-HCC): Principal | ICD-10-CM

## 2022-07-09 DIAGNOSIS — D696 Thrombocytopenia, unspecified: Principal | ICD-10-CM

## 2022-07-09 MED ORDER — TRULICITY 0.75 MG/0.5 ML SUBCUTANEOUS PEN INJECTOR
SUBCUTANEOUS | 3 refills | 84 days | Status: CP
Start: 2022-07-09 — End: 2023-07-09
  Filled 2022-07-17: qty 4, 56d supply, fill #0

## 2022-07-09 MED ORDER — DULOXETINE 20 MG CAPSULE,DELAYED RELEASE
ORAL_CAPSULE | Freq: Every day | ORAL | 1 refills | 90 days | Status: CP
Start: 2022-07-09 — End: 2023-07-09

## 2022-07-09 MED ORDER — SPIRONOLACTONE 100 MG TABLET
ORAL_TABLET | Freq: Every day | ORAL | 0 refills | 90 days | Status: CP
Start: 2022-07-09 — End: 2023-07-09

## 2022-07-09 MED ORDER — FUROSEMIDE 40 MG TABLET
ORAL_TABLET | Freq: Every day | ORAL | 0 refills | 90 days | Status: CP
Start: 2022-07-09 — End: 2023-07-09

## 2022-07-10 ENCOUNTER — Ambulatory Visit: Admit: 2022-07-10 | Discharge: 2022-07-11 | Payer: PRIVATE HEALTH INSURANCE

## 2022-07-11 DIAGNOSIS — Q211 ASD (atrial septal defect): Principal | ICD-10-CM

## 2022-07-17 ENCOUNTER — Ambulatory Visit: Admit: 2022-07-17 | Discharge: 2022-07-18 | Payer: PRIVATE HEALTH INSURANCE

## 2022-07-19 ENCOUNTER — Ambulatory Visit: Admit: 2022-07-19 | Discharge: 2022-07-20 | Payer: PRIVATE HEALTH INSURANCE

## 2022-07-19 DIAGNOSIS — Q211 ASD (atrial septal defect): Principal | ICD-10-CM

## 2022-08-14 ENCOUNTER — Ambulatory Visit: Admit: 2022-08-14 | Discharge: 2022-08-15 | Payer: PRIVATE HEALTH INSURANCE

## 2022-08-14 DIAGNOSIS — K766 Portal hypertension: Principal | ICD-10-CM

## 2022-08-14 DIAGNOSIS — Z1211 Encounter for screening for malignant neoplasm of colon: Principal | ICD-10-CM

## 2022-08-14 DIAGNOSIS — M898X3 Other specified disorders of bone, forearm: Principal | ICD-10-CM

## 2022-08-14 DIAGNOSIS — M79642 Pain in left hand: Principal | ICD-10-CM

## 2022-08-14 DIAGNOSIS — K760 Fatty (change of) liver, not elsewhere classified: Principal | ICD-10-CM

## 2022-08-14 DIAGNOSIS — K7682 Hepatic encephalopathy (CMS-HCC): Principal | ICD-10-CM

## 2022-08-14 DIAGNOSIS — Z1239 Encounter for other screening for malignant neoplasm of breast: Principal | ICD-10-CM

## 2022-08-14 DIAGNOSIS — E119 Type 2 diabetes mellitus without complications: Principal | ICD-10-CM

## 2022-08-14 DIAGNOSIS — K7460 Unspecified cirrhosis of liver: Principal | ICD-10-CM

## 2022-08-14 DIAGNOSIS — M79602 Pain in left arm: Principal | ICD-10-CM

## 2022-08-14 DIAGNOSIS — K7581 Nonalcoholic steatohepatitis (NASH): Principal | ICD-10-CM

## 2022-08-14 DIAGNOSIS — Z79899 Other long term (current) drug therapy: Principal | ICD-10-CM

## 2022-08-15 MED ORDER — LACTULOSE 20 GRAM/30 ML ORAL SOLUTION
Freq: Three times a day (TID) | ORAL | 11 refills | 30 days | Status: CP
Start: 2022-08-15 — End: 2023-08-15

## 2022-08-16 MED ORDER — OZEMPIC 2 MG/DOSE (8 MG/3 ML) SUBCUTANEOUS PEN INJECTOR
SUBCUTANEOUS | 3 refills | 84 days | Status: CP
Start: 2022-08-16 — End: 2023-08-16

## 2022-08-22 ENCOUNTER — Ambulatory Visit: Admit: 2022-08-22 | Discharge: 2022-08-23 | Payer: PRIVATE HEALTH INSURANCE

## 2022-08-22 MED ORDER — RIFAXIMIN 550 MG TABLET
ORAL_TABLET | Freq: Two times a day (BID) | ORAL | 3 refills | 90 days | Status: CP
Start: 2022-08-22 — End: 2023-08-22
  Filled 2022-08-26: qty 180, 90d supply, fill #0

## 2022-08-23 NOTE — Unmapped (Signed)
[facility]   Specialty Lite Counseling    Denise York is a 57 y.o. female with Hepatic Encephalpathy who I am counseling today on initiation of therapy.  I am speaking to the patient.    Was a Nurse, learning disability used for this call? No    Verified patient's date of birth / HIPAA.    Specialty Lite medication(s) to be sent: Infectious Disease: Xifaxan      Non-specialty medications/supplies to be sent: n/a      Medications not needed at this time: n/a         An offer to provide counseling to the patient regarding their medication was made. The patient accepted counseling. The patient was counseled on the following only: medication administration, missed dose instructions, goals of therapy, side effects and monitoring parameters, warnings and precautions, drug/food interactions, and storage, handling precautions, and disposal        Current Medications (including OTC/herbals), Comorbidities and Allergies     Current Outpatient Medications   Medication Sig Dispense Refill    albuterol HFA 90 mcg/actuation inhaler Inhale 2 puffs every six (6) hours as needed for wheezing. 8 g 5    aspirin (ECOTRIN) 81 MG tablet Take 1 tablet (81 mg total) by mouth daily.      blood sugar diagnostic (GLUCOSE BLOOD) Strp Use to test blood sugar daily 100 strip 3    blood-glucose meter kit Use to test blood sugar DAILY 1 each 1    budesonide (PULMICORT FLEXHALER) 180 mcg/actuation inhaler Inhale 2 puffs two (2) times a day. 3 each 3    carvediloL (COREG) 6.25 MG tablet Take 1 tablet (6.25 mg total) by mouth two (2) times a day. 180 tablet 3    cholecalciferol, vitamin D3, 2,000 unit cap Take 2 capsules (4,000 Units total) by mouth daily. (Patient taking differently: Take 1 capsule (50 mcg total) by mouth daily.) 1 each 0    DULoxetine (CYMBALTA) 20 MG capsule Take 1 capsule (20 mg total) by mouth daily. 90 capsule 1    furosemide (LASIX) 40 MG tablet Take 1 tablet (40 mg total) by mouth daily. 90 tablet 0    inhalational spacing device (AEROCHAMBER MV) Spcr Use as directed with inhalers 1 each 0    lactulose 20 gram/30 mL Soln Take 30 mL (20 g total) by mouth Three (3) times a day. Titrate to 3-4 bowel movements daily. 2700 mL 11    lancets Misc Use to test blood sugar daily 100 each 3    loratadine (CLARITIN) 10 mg tablet TAKE 1 TABLET BY MOUTH EVERY DAY 90 tablet 1    magnesium oxide (MAG-OX) 400 mg (241.3 mg elemental magnesium) tablet Take 1 tablet (400 mg total) by mouth daily.      metFORMIN (GLUCOPHAGE-XR) 500 MG 24 hr tablet Take 1 tablet (500 mg total) by mouth daily with evening meal. 90 tablet 1    multivitamin (TAB-A-VITE/THERAGRAN) per tablet Take 1 tablet by mouth daily.      psyllium seed, with sugar, (FIBER ORAL) Take 4 capsules by mouth daily. BJs brand.      rifAXIMin (XIFAXAN) 550 mg Tab Take 1 tablet (550 mg total) by mouth two (2) times a day. 180 tablet 3    rOPINIRole (REQUIP) 0.25 MG tablet Take 1 tablet (0.25 mg total) by mouth two (2) times a day. 180 tablet 1    semaglutide (OZEMPIC) 2 mg/dose (8 mg/3 mL) PnIj Inject 2 mg under the skin every seven (7) days.  9 mL 3    spironolactone (ALDACTONE) 100 MG tablet Take 1 tablet (100 mg total) by mouth daily. 90 tablet 0    traZODone (DESYREL) 50 MG tablet TAKE 1 TABLET BY MOUTH EVERY DAY AT NIGHT 90 tablet 3     No current facility-administered medications for this visit.       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    Appropriateness of Therapy     Prescription has been clinically reviewed: Yes    Financial Information     Medication Assistance provided: None Required    Anticipated copay of $0.00 reviewed with patient.     Patient Specific Needs     Does the patient have any physical, cognitive, or cultural barriers? No    Does the patient have adequate living arrangements? (i.e. the ability to store and take their medication appropriately) Yes    Did you identify any home environmental safety or security hazards? No    Patient prefers to have medications discussed with  Patient     Is the patient or caregiver able to read and understand education materials at a high school level or above? Yes    Patient's primary language is  English     Is the patient high risk? No    SOCIAL DETERMINANTS OF HEALTH     At the [facility], we have learned that life circumstances - like trouble affording food, housing, utilities, or transportation can affect the health of many of our patients.   That is why we wanted to ask: are you currently experiencing any life circumstances that are negatively impacting your health and/or quality of life? Patient declined to answer    Social Determinants of Health     Financial Resource Strain: Not on file   Internet Connectivity: Not on file   Food Insecurity: Patient Declined (08/14/2022)    Hunger Vital Sign     Worried About Running Out of Food in the Last Year: Patient declined     Ran Out of Food in the Last Year: Patient declined   Tobacco Use: Low Risk  (08/14/2022)  Patient History     Smoking Tobacco Use: Never     Smokeless Tobacco Use: Never     Passive Exposure: Not on file   Housing/Utilities: Not on file   Alcohol Use: Not on file   Transportation Needs: Not on file   Substance Use: Not on file   Health Literacy: Not on file   Physical Activity: Not on file   Interpersonal Safety: Not on file   Stress: Not on file   Intimate Partner Violence: Not on file   Depression: Not on file   Social Connections: Not on file       Would you be willing to receive help with any of the needs that you have identified today? Not applicable    Delivery Information     Verified delivery address.    Scheduled delivery date: 08/27/22    Expected start date: 08/27/22    Medication will be delivered via UPS to the prescription address in [facility]  This shipment will not require a signature.      Explained the services we provide at [facility] and that each month we will send text messages and/or mychart messages to set up refills. Informed patient that refills should be scheduled 7-10 days prior to when they will run out of medication. Informed patient that a welcome packet, containing information about our pharmacy and other support services, a Notice of Privacy Practices, and a drug information handout will be sent.      The patient or caregiver noted above participated in the development of this care plan and knows that they can request review of or adjustments to the care plan at any time.      Patient or caregiver verbalized understanding of the above information as well as how to contact the pharmacy at [phone] option 4 with any questions/concerns.  The pharmacy is open Monday through Friday 8:30am-4:30pm.  A pharmacist is available 24/7 via pager to answer any clinical questions they may have.      Roderic Palau, PharmD  [facility] Specialty Pharmacist

## 2022-08-23 NOTE — Unmapped (Signed)
[facility] Specialty Medication Onboarding    Specialty Medication: XIFAXAN 550 mg Tab (rifAXIMin)  Prior Authorization: Not Required   Financial Assistance: No - copay  <$25  Final Copay/Day Supply: $0 / 90    Insurance Restrictions: None     Notes to Pharmacist:     The triage team has completed the benefits investigation and has determined that the patient is able to fill this medication at [facility]. Please contact the patient to complete the onboarding or follow up with the prescribing physician as needed.

## 2022-08-26 ENCOUNTER — Ambulatory Visit: Admit: 2022-08-26 | Discharge: 2022-08-27 | Payer: PRIVATE HEALTH INSURANCE

## 2022-08-26 NOTE — Unmapped (Signed)
Thank you for choosing [facility]!  We appreciate the opportunity to participate in your care.      If any questions or concerns arise after your visit, please do not hesitant to contact me by [facility] or by calling the hand team at [phone].      Voicemail messages: Messages are checked between 8:00 am- 4:00 pm Monday- Friday.    MyChart messages: These messages are checked by the nurses during normal business hours 8:30 am-4:30 pm Monday-Friday every 24-48 hours and are for non-urgent, non-emergent concerns. You may be asked to return for a follow up visit if it is deemed your questions are best handled in the clinic setting.    Please let me know if I can be of assistance with this or other orthopaedic issues in the future.      Denise York has ordered an EMG test for you. Please give them a call at [phone] to schedule. They will not call you to schedule. If you would like an external referral to another location please reach out to [place] via MyChart or by leaving a voicemail at [phone] and let us know the location you would like to have your EMG done at.      It will take 7-14 days for EMG results to return. We will contact you to arrange a phone visit or follow up appointment after we receive EMG results.

## 2022-08-26 NOTE — Unmapped (Signed)
[facility]  [employer]. 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  We will proceed with an EMG for further evaluation of the left upper extremity which patient is in agreement with.  We will contact patient via phone to discuss results per patient's request.  Cock-up wrist brace was provided to wear at nighttime  We will proceed with the following treatment plan:  Medications: OTC Tylenol  DME/Cast: Cock-up wrist brace  PT/OT: none  Injections: none  5.   Follow-up: No follow-ups on file.   6.   X-rays at next visit: none    SUBJECTIVE:  Chief Complaint:  left hand numbness  History of Present Illness:   Denise York is a 57 y.o. right hand dominant female  who presents for evaluation of left hand pain and numbness that has been ongoing for over 1 year.  Patient reports pain, numbness, tingling that will radiate primarily into the small and ring fingers.  She reports she has weakness in the left upper extremity and feels as though is difficult to hold objects.  She notes that symptoms are worse and often with typing and sleeping.  She also endorses left-sided neck pain that has been present for approximately 1 year.  She has tried Tylenol and heat with limited improvement.  She was referred by her PCP for further evaluation of left hand pain and numbness.  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    Obesity     Prediabetes       Surgical History Past Surgical History:   Procedure Laterality Date    ABDOMINAL WALL MESH  REMOVAL  1997, 2005    Placement, 1997, removal and replacement 2005    BLADDER SURGERY bladder tact X2    BREAST BIOPSY Right 04/2013    benign    BREAST EXCISIONAL BIOPSY Right 05/2013    benign    BREAST SURGERY Right     precancerous spot removed    CHOLECYSTECTOMY      HYSTERECTOMY      PR COLSC FLX W/RMVL OF TUMOR POLYP LESION SNARE TQ N/A 10/14/2016    Procedure: COLONOSCOPY FLEX; W/REMOV TUMOR/LES BY SNARE;  Surgeon: Alfred Levins, MD;  Location: [facility];  Service: Gastroenterology    SKIN BIOPSY        Medications   Current Outpatient Medications:     aspirin (ECOTRIN) 81 MG tablet, Take 1 tablet (81 mg total) by mouth daily., Disp: , Rfl:     blood sugar diagnostic (GLUCOSE BLOOD) Strp, Use to test blood sugar daily, Disp: 100 strip, Rfl: 3    blood-glucose meter kit, Use to test blood sugar DAILY, Disp: 1 each, Rfl: 1    carvediloL (COREG) 6.25 MG tablet, Take 1 tablet (6.25 mg total) by mouth two (2) times a day., Disp: 180 tablet, Rfl: 3    cholecalciferol, vitamin D3, 2,000 unit cap, Take 2 capsules (4,000 Units total) by mouth daily. (Patient taking differently: Take 1 capsule (50 mcg total) by  mouth daily.), Disp: 1 each, Rfl: 0    DULoxetine (CYMBALTA) 20 MG capsule, Take 1 capsule (20 mg total) by mouth daily., Disp: 90 capsule, Rfl: 1    furosemide (LASIX) 40 MG tablet, Take 1 tablet (40 mg total) by mouth daily., Disp: 90 tablet, Rfl: 0    inhalational spacing device (AEROCHAMBER MV) Spcr, Use as directed with inhalers, Disp: 1 each, Rfl: 0    lactulose 20 gram/30 mL Soln, Take 30 mL (20 g total) by mouth Three (3) times a day. Titrate to 3-4 bowel movements daily., Disp: 2700 mL, Rfl: 11    lancets Misc, Use to test blood sugar daily, Disp: 100 each, Rfl: 3    loratadine (CLARITIN) 10 mg tablet, TAKE 1 TABLET BY MOUTH EVERY DAY, Disp: 90 tablet, Rfl: 1    magnesium oxide (MAG-OX) 400 mg (241.3 mg elemental magnesium) tablet, Take 1 tablet (400 mg total) by mouth daily., Disp: , Rfl:     metFORMIN (GLUCOPHAGE-XR) 500 MG 24 hr tablet, Take 1 tablet (500 mg total) by mouth daily with evening meal., Disp: 90 tablet, Rfl: 1    multivitamin (TAB-A-VITE/THERAGRAN) per tablet, Take 1 tablet by mouth daily., Disp: , Rfl:     psyllium seed, with sugar, (FIBER ORAL), Take 4 capsules by mouth daily. BJs brand., Disp: , Rfl:     rifAXIMin (XIFAXAN) 550 mg Tab, Take 1 tablet (550 mg total) by mouth two (2) times a day., Disp: 180 tablet, Rfl: 3    rOPINIRole (REQUIP) 0.25 MG tablet, Take 1 tablet (0.25 mg total) by mouth two (2) times a day., Disp: 180 tablet, Rfl: 1    semaglutide (OZEMPIC) 2 mg/dose (8 mg/3 mL) PnIj, Inject 2 mg under the skin every seven (7) days., Disp: 9 mL, Rfl: 3    spironolactone (ALDACTONE) 100 MG tablet, Take 1 tablet (100 mg total) by mouth daily., Disp: 90 tablet, Rfl: 0    traZODone (DESYREL) 50 MG tablet, TAKE 1 TABLET BY MOUTH EVERY DAY AT NIGHT, Disp: 90 tablet, Rfl: 3   Allergies Penicillins, Glipizide, and Lisinopril     Social History Social History     Tobacco Use    Smoking status: Never    Smokeless tobacco: Never   Substance Use Topics    Alcohol use: No    Drug use: No        Family History family history includes Allergies in her father and another family member; Coronary artery disease in an other family member; Gout in her father; Heart disease in her father and mother; Hypertension in her mother; Kidney disease in her father; Kidney failure in an other family member; No Known Problems in her daughter, maternal grandfather, maternal grandmother, paternal grandfather, paternal grandmother, and sister.     Review of Systems 12 point review of systems were obtained in clinic and all pertinent postives/negatives were documented in the HPI.       OBJECTIVE:  Physical Exam:  General Appearance well-nourished and no acute distress   Mood and Affect alert, cooperative, and pleasant   Gait and Station neutral standing alignment   Cardiovascular well-perfused distally and no swelling   Sensation Sensation intact to light touch distally  in the bilateral median, ulnar, and radial nerve distribution    MUSCULOSKELETAL    Right upper extremity Inspection: Skin clean, dry, and intact  Tenderness: Nontender about the wrist/hand diffusely  ROM: Able to make a full fist and bring fingers to full extension  Stability Exam: No  evidence of instability of the wrist  Strength: 5/5 grip strength   Special Test: negative tinels, negative durkans, and negative tinels at the elbow   Left upper extremity Inspection: Skin clean, dry, and intact and no evidence of thenar or hypothenar atrophy  Tenderness: Nontender about the wrist/hand diffusely  ROM: Able to make a full fist and bring fingers to full extension and ROM of the wrist full with flexion, extension, supination, and pronation  Stability Exam: No evidence of instability of the wrist  Strength: 4+/5 grip strength, 5/5 abduction strength of the thumb, 5/5 wrist flexion and extension strength, and 4/5 abduction/adduction strength of the fingers. Negative wartenburg sign, negative jeanne sign    Special Test:  Equivocal Tinel's at the elbow, positive Tinel's at Guyon's canal, negative tinels, and negative durkans   DME ORDER:  Dx: M89.8X3, M79.602, M79.642, R20.0, R20.2, Pain in left ulna, Pain in left arm, Pain in left hand, Numbness and tingling in left hand  Body Location: Hand/Wrist/Elbow  Hand/Wrist/Elbow Orthotics: Wrist Cock-Up- 8  Laterality: Left    Left arm and hand pain - ulnar side., DME Upper Extremity,  , The patient was prescribed this orthosis to be used on the upper extremity for the purpose of reducing pain and providing support and protection.

## 2022-09-04 ENCOUNTER — Ambulatory Visit: Admit: 2022-09-04 | Discharge: 2022-09-05 | Payer: PRIVATE HEALTH INSURANCE

## 2022-09-05 DIAGNOSIS — Q211 ASD (atrial septal defect): Principal | ICD-10-CM

## 2022-09-05 DIAGNOSIS — Z01818 Encounter for other preprocedural examination: Principal | ICD-10-CM

## 2022-09-05 NOTE — Unmapped (Signed)
[facility]  Clinical Neurophysiology Laboratory  [place], [place]         Patient: Denise York Date of Birth: 1966/02/11  [facility] #: [id number] Handedness: Right  Sex: Female      Visit Date: 09/04/2022 9:49 AM  Age: 57 Years  Attending: Gasper Sells, MD   Resident/Fellow: Tenny Craw, MD  Req Provider: Precious Reel, PA   Current Height: 5 feet 5 inch  Chief complaint: Left 4th and 5th digit numbness  History/Exam: 57 yo F with PMHx significant for T2DM (10.3% on 08/14/22) who presents with several months - years of left 4th and 5th digit numbness in addition to left ulnar forearm pain. Endorses weakness in the left hand and frequently dropping objects.     Exam: 5/5 strength in BUE's with SA, EF, EE, WE, Grip. 5th digit abduction 4+/5. SILT in BUE's except reduced in left 5th digit (palmar and volar). 2+ Biceps and BR reflexes.     Clinical Impression:    Review of referring physician's records was performed.       Motor NCS      Nerve / Sites Muscle Latency Ref. Amplitude Ref. Dur. Distance Lat Diff Velocity Ref.     ms ms mV mV ms cm ms m/s m/s   L Median - APB      Wrist APB 3.63 ?4.40 10.3 ?4.2 4.46 8         Elbow APB 7.27  10.7  5.46 23 3.65 63.1 ?49.0   L Ulnar - ADM      Wrist ADM 3.17 ?3.50 6.0 ?5.6 5.92 8         B.Elbow ADM 5.73  5.1  5.56 16.5 2.56 64.4 ?49.0      A.Elbow ADM 8.88  3.0  6.69 10 3.15 31.8 ?49.0   R Ulnar - ADM      Wrist ADM 2.98 ?3.50 11.5 ?5.6 5.50 8         B.Elbow ADM 5.42  10.4  5.42 16.5 2.44 67.7 ?49.0      A.Elbow ADM 7.69  9.9  5.56 11.5 2.27 50.6 ?49.0       F  Wave      Nerve F min Ref.    ms ms   L Median - APB 25.2 ?31.0   L Ulnar - ADM 25.5 ?31.0       Sensory NCS      Nerve / Sites Rec. Site Peak Lat Ref. PP Amp Ref. Distance Vel.     ms ms ??V ??V cm m/s   L Median - Dig II (Antidromic)      Wrist Index 3.29 ?3.80 64.7 ?10.0 14 55   L Ulnar - Dig V (Antidromic)      Wrist Dig V NR ?3.80 NR ?10.0 14 NR   R Ulnar - Dig V (Antidromic)      Wrist Dig V 3.50 ?3.80 18.1 ?10.0 14 53       EMG Summary Table     Spontaneous MUP Recruitment Comment   Muscle Nerve Roots Fib PSW Fasc Other Amp Dur. PPP Pattern Other   L. First dorsal interosseous Ulnar C8-T1 None None None None N N N Normal None   L. Abductor pollicis brevis Median C8-T1 None None None None N N N Normal None   L. Flexor carpi ulnaris Ulnar C7-T1 None None None None N N N Normal None       Summary:  After identifying the patient in  the waiting room and reviewing all appropriately available medical records, the patient was taken back to the examination room where the procedure was explained, the sites of examination were noted, the patient's questions were answered and the patient's verbal consent for the procedure was obtained. Studies were performed at 24??C using a radiant warmer and [employer] Synergy EMG System.    Sensory nerve conduction studies of the left ulnar nerve demonstrate absent evoked responses. Sensory nerve conduction studies of the right ulnar and left median nerve demonstrate normal peak latencies and normal SNAP amplitudes.    Motor nerve conduction studies of the left median nerve demonstrate normal distal motor latencies, normal CMAP amplitudes, and normal conduction velocities. Motor nerve conduction studies of the left ulnar nerve demonstrate normal distal motor latencies, normal CMAP amplitudes at the wrist but reduced CMAP amplitudes above the elbow with slowed conduction velocities across the elbow. The right ulnar nerve, assessed for comparison, demonstrates normal distal motor latencies, normal CMAP amplitudes, and normal conduction velocities, although with some relative slowing across the elbow (greater than 10 meters/second difference).    Minimal F-wave latencies are normal in all nerves tested.    Needle EMG of the selected muscles in the left upper extremity shows no abnormal spontaneous activity, normal MUAP forms, and normal recruitment.     Conclusions:  This is an abnormal study. 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    - - - - - - - - - - - - -   Resident   Tenny Craw, MD PGY-4    As the attending physician, my signature affirms that I personally participated in the clinical assessment of this patient, performed or reviewed all electrodiagnostic procedures, and prepared or reviewed the conclusions of this report.      - - - - - - - - - - - -  Attending Electromyographer  Gasper Sells, MD

## 2022-09-06 ENCOUNTER — Ambulatory Visit: Admit: 2022-09-06 | Discharge: 2022-09-07 | Payer: PRIVATE HEALTH INSURANCE

## 2022-09-06 LAB — CBC
HEMATOCRIT: 41.9 % (ref 34.0–44.0)
HEMOGLOBIN: 14.4 g/dL (ref 11.3–14.9)
MEAN CORPUSCULAR HEMOGLOBIN CONC: 34.4 g/dL (ref 32.0–36.0)
MEAN CORPUSCULAR HEMOGLOBIN: 30.1 pg (ref 25.9–32.4)
MEAN CORPUSCULAR VOLUME: 87.6 fL (ref 77.6–95.7)
MEAN PLATELET VOLUME: 10 fL (ref 6.8–10.7)
PLATELET COUNT: 53 10*9/L — ABNORMAL LOW (ref 150–450)
RED BLOOD CELL COUNT: 4.79 10*12/L (ref 3.95–5.13)
RED CELL DISTRIBUTION WIDTH: 16.2 % — ABNORMAL HIGH (ref 12.2–15.2)
WBC ADJUSTED: 5.2 10*9/L (ref 3.6–11.2)

## 2022-09-06 LAB — BASIC METABOLIC PANEL
ANION GAP: 8 mmol/L (ref 5–14)
BLOOD UREA NITROGEN: 13 mg/dL (ref 9–23)
BUN / CREAT RATIO: 15
CALCIUM: 10.3 mg/dL (ref 8.7–10.4)
CHLORIDE: 101 mmol/L (ref 98–107)
CO2: 28.1 mmol/L (ref 20.0–31.0)
CREATININE: 0.89 mg/dL
EGFR CKD-EPI (2021) FEMALE: 76 mL/min/{1.73_m2} (ref >=60)
GLUCOSE RANDOM: 167 mg/dL (ref 70–179)
POTASSIUM: 4.4 mmol/L (ref 3.4–4.8)
SODIUM: 137 mmol/L (ref 135–145)

## 2022-09-10 ENCOUNTER — Institutional Professional Consult (permissible substitution): Admit: 2022-09-10 | Discharge: 2022-09-11 | Payer: PRIVATE HEALTH INSURANCE

## 2022-09-10 ENCOUNTER — Ambulatory Visit: Admit: 2022-09-10 | Discharge: 2022-09-11 | Payer: PRIVATE HEALTH INSURANCE

## 2022-09-10 ENCOUNTER — Other Ambulatory Visit: Payer: Self-pay | Admitting: Family Medicine

## 2022-09-10 DIAGNOSIS — Z1231 Encounter for screening mammogram for malignant neoplasm of breast: Secondary | ICD-10-CM

## 2022-09-10 NOTE — Unmapped (Signed)
I discussed with patient EMG results which reveal:  This is an abnormal study.   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  Patient continues to suffer left hand numbness primarily in the small and ring fingers and we discussed evidence of cubital tunnel syndrome on exam.  She would like to proceed with conservative management with nighttime elbow bracing over the next 2 to 3 months which she is in agreement with.  If symptoms persist or worsen in the future she will follow-up with one of our surgeons for consideration of left cubital tunnel release.

## 2022-09-10 NOTE — Unmapped (Unsigned)
Chief Complaint:  Chief Complaint   Patient presents with    Atrial Septal Defect     discuss short term disablity forms       This patients last WCC/CPE date: : 10/15/2019  This patient's last AWV date: [facility] Last Medicare Wellness Visit Date: Not Found    History of Present Illness:  Denise York is a 57 y.o. female with past medical history as below presents today for evaluation/follow up of ***.    She has lost 24 pounds since being on high dose ozempic.     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    Obesity     Prediabetes      Patient Active Problem List   Diagnosis    Insomnia    Vitamin deficiency    Dysthymic disorder    Restless leg syndrome    Urinary frequency    Incomplete bladder emptying    Nonspecific abnormal results of liver function study    Edema    Vitamin D deficiency    Itch    Fatty liver disease, nonalcoholic    Constipation    Depression    Neoplasm of right breast, 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    Type 2 diabetes mellitus without complication (CMS-HCC)    Bowel incontinence    Mixed incontinence urge and stress    Encounter for long-term current use of medication    Obesity    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    Back pain    Neoplasm of uncertain behavior    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)    Portal hypertension (CMS-HCC)    Hepatic encephalopathy (CMS-HCC)    Thrombocytopenia (CMS-HCC)    Melena    Lower extremity edema    ASD (atrial septal defect)     OB History       Gravida   2    Para   2    Term   2 Preterm        AB        Living   0         SAB        IAB        Ectopic        Molar        Multiple        Live Births   2              Past Surgical History:   Procedure Laterality Date    ABDOMINAL WALL MESH  REMOVAL  1997, 2005    Placement, 1997, removal and replacement 2005    BLADDER SURGERY      bladder tact X2    BREAST BIOPSY Right 04/2013    benign    BREAST EXCISIONAL BIOPSY Right 05/2013    benign    BREAST SURGERY Right     precancerous spot removed    CHOLECYSTECTOMY  HYSTERECTOMY      PR COLSC FLX W/RMVL OF TUMOR POLYP LESION SNARE TQ N/A 10/14/2016    Procedure: COLONOSCOPY FLEX; W/REMOV TUMOR/LES BY SNARE;  Surgeon: Alfred Levins, MD;  Location: [facility] [place];  Service: Gastroenterology    SKIN BIOPSY         Allergies:  Penicillins, Glipizide, and Lisinopril    Current Outpatient Medications   Medication Sig Dispense Refill    aspirin (ECOTRIN) 81 MG tablet Take 1 tablet (81 mg total) by mouth daily.      blood sugar diagnostic (GLUCOSE BLOOD) Strp Use to test blood sugar daily 100 strip 3    blood-glucose meter kit Use to test blood sugar DAILY 1 each 1    cholecalciferol, vitamin D3, 2,000 unit cap Take 2 capsules (4,000 Units total) by mouth daily. (Patient taking differently: Take 1 capsule (50 mcg total) by mouth daily.) 1 each 0    DULoxetine (CYMBALTA) 20 MG capsule Take 1 capsule (20 mg total) by mouth daily. 90 capsule 1    furosemide (LASIX) 40 MG tablet Take 1 tablet (40 mg total) by mouth daily. 90 tablet 0    inhalational spacing device (AEROCHAMBER MV) Spcr Use as directed with inhalers 1 each 0    lancets Misc Use to test blood sugar daily 100 each 3    loratadine (CLARITIN) 10 mg tablet TAKE 1 TABLET BY MOUTH EVERY DAY 90 tablet 1    magnesium oxide (MAG-OX) 400 mg (241.3 mg elemental magnesium) tablet Take 1 tablet (400 mg total) by mouth daily.      metFORMIN (GLUCOPHAGE-XR) 500 MG 24 hr tablet Take 1 tablet (500 mg total) by mouth daily with evening meal. 90 tablet 1    multivitamin (TAB-A-VITE/THERAGRAN) per tablet Take 1 tablet by mouth daily.      psyllium seed, with sugar, (FIBER ORAL) Take 4 capsules by mouth daily. BJs brand.      rifAXIMin (XIFAXAN) 550 mg Tab Take 1 tablet (550 mg total) by mouth two (2) times a day. 180 tablet 3    rOPINIRole (REQUIP) 0.25 MG tablet Take 1 tablet (0.25 mg total) by mouth two (2) times a day. 180 tablet 1    semaglutide (OZEMPIC) 2 mg/dose (8 mg/3 mL) PnIj Inject 2 mg under the skin every seven (7) days. 9 mL 3    spironolactone (ALDACTONE) 100 MG tablet Take 1 tablet (100 mg total) by mouth daily. 90 tablet 0    traZODone (DESYREL) 50 MG tablet TAKE 1 TABLET BY MOUTH EVERY DAY AT NIGHT 90 tablet 3    carvediloL (COREG) 6.25 MG tablet Take 1 tablet (6.25 mg total) by mouth two (2) times a day. (Patient not taking: Reported on 09/10/2022) 180 tablet 3    lactulose 20 gram/30 mL Soln Take 30 mL (20 g total) by mouth Three (3) times a day. Titrate to 3-4 bowel movements daily. (Patient not taking: Reported on 09/10/2022) 2700 mL 11     No current facility-administered medications for this visit.     Social History     Socioeconomic History    Marital status: Married     Spouse name: None    Number of children: None    Years of education: None    Highest education level: None   Tobacco Use    Smoking status: Never    Smokeless tobacco: Never   Substance and Sexual Activity    Alcohol use: No    Drug use:  No    Sexual activity: Not Currently   Other Topics Concern    Do you use sunscreen? Yes    Excessive sun exposure? Yes   Social History Narrative    2 births. No living children. Married new husband 2014.         02/21/2017        PCMH Components:        Family, social, cultural characteristics: social support includes best friend .      Patient has the following communication needs: none, per patient     Health Literacy: How confident are you that you understand your health issues/concerns, can participate in your care, and manage your care along with your physician: confident.    Behaviors Affecting Health: none, per patient    Family history of mental health illness and/or substance abuse: asked patient/parent and none disclosed.    Have you been seen by any medical provider that we have not referred you to since your last visit ? No    Discussed a Living Will with the patient and ZOX:WRUEAVW is under the age of 62.      Social Determinants of Health     Food Insecurity: Patient Declined (08/14/2022)    Hunger Vital Sign     Worried About Running Out of Food in the Last Year: Patient declined     Ran Out of Food in the Last Year: Patient declined     Family History   Problem Relation Age of Onset    Heart disease Mother     Hypertension Mother     Heart disease Father     Kidney disease Father     Allergies Father     Gout Father     Allergies Other         FAMILY H/O    Coronary artery disease Other         FAMILY H/O    Kidney failure Other         FAMILY H/O    No Known Problems Sister     No Known Problems Daughter     No Known Problems Maternal Grandmother     No Known Problems Maternal Grandfather     No Known Problems Paternal Grandmother     No Known Problems Paternal Grandfather     BRCA 1/2 Neg Hx     Breast cancer Neg Hx     Cancer Neg Hx     Colon cancer Neg Hx     Endometrial cancer Neg Hx     Ovarian cancer Neg Hx     Substance Abuse Disorder Neg Hx     Mental illness Neg Hx      Immunization History   Administered Date(s) Administered    COVID-19 VAC,MRNA,TRIS(12Y UP)(PFIZER)(GRAY CAP) 08/19/2020    COVID-19 VACC,MRNA,(PFIZER)(PF) 10/02/2019, 10/23/2019    Covid-19 Vac, (84yr+) (Comirnaty) [employer]  04/08/2022    HEPATITIS B VACCINE ADULT, ADJUVANTED, IM(HEPLISAV B) 06/17/2022    Hepatitis A (Adult) 06/17/2022    INFLUENZA INJ MDCK PF, QUAD,(FLUCELVAX)(43MO AND UP EGG FREE) 05/15/2021    INFLUENZA TIV (TRI) PF (IM) 04/09/2011    Influenza TRI (IIV3) 5+yrs MDV 06/04/2013    Influenza Vaccine Quad(IM)6 MO-Adult(PF) 05/08/2017, 04/09/2018, 03/11/2019    Influenza Virus Vaccine, unspecified formulation 04/08/2015, 04/07/2016, 05/04/2020, 04/08/2022    PNEUMOCOCCAL POLYSACCHARIDE 23-VALENT 07/19/2014    Pneumococcal Conjugate 20-valent 06/17/2022    SHINGRIX-ZOSTER VACCINE (HZV),RECOMBINANT,ADJUVANTED(IM) 11/09/2020, 05/15/2021    TdaP 07/16/2011, 06/17/2022  Health Maintenance   Topic Date Due    Colon Cancer Screening  10/14/2021    Mammogram Start Age 69  10/24/2022    Hemoglobin A1c  11/12/2022    Retinal Eye Exam  11/21/2022    Foot Exam  05/09/2023    Urine Albumin/Creatinine Ratio  05/09/2023    Serum Creatinine Monitoring  09/06/2023    Potassium Monitoring  09/06/2023    Lipid Screening  05/10/2027    DTaP/Tdap/Td Vaccines (3 - Td or Tdap) 06/17/2032    Pneumococcal Vaccine 0-64  Completed    Hepatitis C Screen  Completed    COVID-19 Vaccine  Completed    Influenza Vaccine  Completed    Zoster Vaccines  Completed       I have reviewed and (if needed) updated the patient's problem list, medications, allergies, past medical and surgical history, social and family history ***    Review of Systems:  Answers submitted by the patient for this visit:  Abdominal Pain Questionnaire (Submitted on 09/06/2022)  Chief Complaint: Abdominal pain  Chronicity: new  Onset: more than 1 month ago  Onset quality: gradual  Frequency: constantly  Episode duration: 7 Days  Progression since onset: waxing and waning  Pain location: right flank  Pain - numeric: 8/10  Pain quality: a sensation of fullness, sharp  Radiates to: epigastric region, back  anorexia: Yes  arthralgias: Yes  constipation: Yes  myalgias: Yes  nausea: Yes  vomiting: Yes  Aggravated by: eating, vomiting  Relieved by: eating, sitting up, standing, vomiting      Physical Exam:  Vital Signs:  Vitals:    09/10/22 1100   BP: 120/76   Pulse: 81   Resp: 16   Temp: 36.6 ??C (97.9 ??F)   SpO2: 96%     Height: 163.5 cm (5' 4.37)    Body mass index is 37.84 kg/m??.  Wt Readings from Last 3 Encounters:   09/10/22 (!) 101.2 kg (223 lb)   08/14/22 (!) 103.5 kg (228 lb 3.2 oz)   07/19/22 106.6 kg (235 lb)     No LMP recorded. Patient has had a hysterectomy.    General: Well nourished, well developed, no acute distress. See BMI.  Head: Normocephalic, Atraumatic  Eyes: Sclera and conjunctiva clear, no drainage.PERRL, EOMI bilaterally.  Ears: External ears normal, Canals clear,  TMs with normal light reflex  Nose Nares***, No sinus TTP***    Mouth mucus membranes moist, posterior oropharynx without erythema or exudates. dentition ***.  Neck: Supple, normal ROM, no thyromegaly.  Lymph Nodes: No cervical or supraclavicular lymphadenopathy.  Skin: No rashes or obvious concerning lesions on exposed skin.  Cardiovascular: RRR, normal S1/S2, no murmurs, rubs or gallops. No chest wall TTP.  Lungs: CTA bilaterally, without crackles/wheezes/rhonchi, good air movement, no increased WOB  Abdomen:  Soft, non-distended, non-tender, no HSM or masses. Normal bowel sounds.***    Extremities: No edema, pulses 2 +  PT DP ***  Musculoskeletal: Normal tone UEs/LEs  Neurologic: CNs 2-12 grossly intact, balance normal. Gross motor/fine motor normal.  Psychiatry: Pleasant affect, well kept, no abnormalities.    Imaging Studies:  {lmbimaging:[id number]::No imaging ordered this visit.}    Labs:  Recent Results (from the past 672 hour(s))   Comprehensive Metabolic Panel    Collection Time: 08/14/22  3:57 PM   Result Value Ref Range    Sodium 128 (L) 135 - 145 mmol/L    Potassium 4.8 3.4 - 4.8 mmol/L    Chloride 96 (L) 98 -  107 mmol/L    CO2 25.7 20.0 - 31.0 mmol/L    Anion Gap 6 5 - 14 mmol/L    BUN 16 9 - 23 mg/dL    Creatinine 1.61 0.96 - 1.02 mg/dL    BUN/Creatinine Ratio 16     eGFR CKD-EPI (2021) Female 66 >=60 mL/min/1.46m2    Glucose 619 (HH) 70 - 179 mg/dL    Calcium 9.9 8.7 - 04.5 mg/dL    Albumin 3.2 (L) 3.4 - 5.0 g/dL    Total Protein 7.1 5.7 - 8.2 g/dL    Total Bilirubin 2.0 (H) 0.3 - 1.2 mg/dL    AST 51 (H) <=40 U/L    ALT 41 10 - 49 U/L    Alkaline Phosphatase 83 46 - 116 U/L   Hemoglobin A1c    Collection Time: 08/14/22  3:57 PM   Result Value Ref Range    Hemoglobin A1C 10.3 (H) 4.8 - 5.6 %    Estimated Average Glucose 249 mg/dL   CBC w/ Differential    Collection Time: 08/14/22  3:57 PM   Result Value Ref Range    WBC 5.1 3.6 - 11.2 10*9/L    RBC 4.55 3.95 - 5.13 10*12/L    HGB 13.6 11.3 - 14.9 g/dL    HCT 98.1 19.1 - 47.8 %    MCV 88.8 77.6 - 95.7 fL    MCH 29.8 25.9 - 32.4 pg    MCHC 33.6 32.0 - 36.0 g/dL    RDW 29.5 62.1 - 30.8 %    MPV 11.3 (H) 6.8 - 10.7 fL    Platelet 55 (L) 150 - 450 10*9/L    nRBC 0 <=4 /100 WBCs    Neutrophils % 57.4 %    Lymphocytes % 29.3 %    Monocytes % 9.3 %    Eosinophils % 3.4 %    Basophils % 0.6 %    Absolute Neutrophils 2.9 1.8 - 7.8 10*9/L    Absolute Lymphocytes 1.5 1.1 - 3.6 10*9/L    Absolute Monocytes 0.5 0.3 - 0.8 10*9/L    Absolute Eosinophils 0.2 0.0 - 0.5 10*9/L    Absolute Basophils 0.0 0.0 - 0.1 10*9/L   ABO/RH    Collection Time: 09/06/22 11:44 AM   Result Value Ref Range    Blood Type A POS    Basic Metabolic Panel    Collection Time: 09/06/22 11:44 AM   Result Value Ref Range    Sodium 137 135 - 145 mmol/L    Potassium 4.4 3.4 - 4.8 mmol/L    Chloride 101 98 - 107 mmol/L    CO2 28.1 20.0 - 31.0 mmol/L    Anion Gap 8 5 - 14 mmol/L    BUN 13 9 - 23 mg/dL    Creatinine 6.57 8.46 - 1.02 mg/dL    BUN/Creatinine Ratio 15     eGFR CKD-EPI (2021) Female 76 >=60 mL/min/1.28m2    Glucose 167 70 - 179 mg/dL    Calcium 96.2 8.7 - 95.2 mg/dL   CBC    Collection Time: 09/06/22 11:44 AM   Result Value Ref Range    WBC 5.2 3.6 - 11.2 10*9/L    RBC 4.79 3.95 - 5.13 10*12/L    HGB 14.4 11.3 - 14.9 g/dL    HCT 84.1 32.4 - 40.1 %    MCV 87.6 77.6 - 95.7 fL    MCH 30.1 25.9 - 32.4 pg    MCHC 34.4 32.0 - 36.0 g/dL  RDW 16.2 (H) 12.2 - 15.2 %    MPV 10.0 6.8 - 10.7 fL    Platelet 53 (L) 150 - 450 10*9/L   Pre-Procedure Type and Screen    Collection Time: 09/06/22 11:44 AM   Result Value Ref Range ABO Grouping A POS     Antibody Screen NEG    HX VERIFICATION FORM PRE-OP    Collection Time: 09/06/22 11:44 AM   Result Value Ref Range    HIST VERIF FORM 30 DAYS        Assessment & Plan: Pathophysiology of condition(s) reviewed with patient if applicable. Pt stated understanding and there were no barriers to learning. *** SEE PATIENT INSTRUCTIONS BELOW FOR DISCUSSION/PLAN    {No diagnosis found. (Refresh or delete this SmartLink)}    M***    Patient Instructions   SPECIFIC INSTRUCTIONS WE DISCUSSED TODAY:  ***  Thank you for choosing [facility] for your care!    If you have a question about a recent visit or a plan that was discussed with your provider in the past: Please go to [email or web address] (or your phone app) and sign in to your [facility] Chart to send this information. I do my best to respond to messages within 3 business days, but it is not always possible to provide the most timely and complete information. For more urgent questions, CALLING THE OFFICE is always the best option.    If you are experiencing any new or worsening symptoms or you would like to talk about changing a medication or medical plan; it is best to schedule an appointment: Please call our clinic at [phone] to schedule an appointment with your provider or the Acute Care Clinic or you can access the [facility] at [phone]. If you cannot decide if your issue warrants a visit, you can always ask to discuss this with one of our clinical care team.    If you have urgent healthcare needs after normal business hours, on weekends, or during holidays:  We have an Acute Care Clinic available by appointment. Call [phone] to schedule this. We offer care for health issues that do not require the emergency room.   Monday - Thursday (8am to 7pm)  Friday (8am to 5pm)   Saturdays (9am to 1pm)   You can always reach the [facility] 24/7 Nursing Line after hours to get nurse advice. The nurse can consult with the doctor on call if indicated. Just call our main office number and follow the prompts - [phone]     You may receive a patient satisfaction survey by mail, text or email regarding your visit today. Your opinion is important to me. Your response benefits [place] all :-)    If you had or will have testing done, test results will be posted on [facility] or sent via letter.  A member of our Care Team will also contact you by message or phone if your results require follow-up.Please note that specialty test results take longer. Lab results may post before I review them. We will follow up as needed when I review them.    Lorel Monaco. Jessyka Austria, MD -   [facility]  [address], [place] [address]  Phone: [phone] Fax: [phone]  [email or web address]        No follow-ups on file.    Lorel Monaco.Mariellen Blaney, MD  Family Physician    [facility]  [address]  [address]  [place], [place] [address]  Ph [phone]  Fax [phone]

## 2022-09-12 ENCOUNTER — Encounter
Admit: 2022-09-12 | Discharge: 2022-09-13 | Disposition: A | Discharge status: Home / Self Care | Payer: PRIVATE HEALTH INSURANCE

## 2022-09-12 ENCOUNTER — Ambulatory Visit
Admit: 2022-09-12 | Discharge: 2022-09-13 | Disposition: A | Discharge status: Home / Self Care | Payer: PRIVATE HEALTH INSURANCE

## 2022-09-12 ENCOUNTER — Encounter
Admit: 2022-09-12 | Discharge: 2022-09-13 | Disposition: A | Discharge status: Home / Self Care | Payer: PRIVATE HEALTH INSURANCE | Attending: Student in an Organized Health Care Education/Training Program

## 2022-09-12 ENCOUNTER — Ambulatory Visit: Admit: 2022-09-12 | Discharge: 2022-09-12 | Payer: PRIVATE HEALTH INSURANCE

## 2022-09-12 ENCOUNTER — Ambulatory Visit: Disposition: A | Discharge status: Home / Self Care | Payer: PRIVATE HEALTH INSURANCE

## 2022-09-12 LAB — BLOOD GAS CRITICAL CARE PANEL, ARTERIAL
BASE EXCESS ARTERIAL: 0.8 (ref -2.0–2.0)
CALCIUM IONIZED ARTERIAL (MG/DL): 4.95 mg/dL (ref 4.40–5.40)
FIO2 ARTERIAL: 24
GLUCOSE WHOLE BLOOD: 155 mg/dL (ref 70–179)
HCO3 ARTERIAL: 24 mmol/L (ref 22–27)
HEMOGLOBIN BLOOD GAS: 13.3 g/dL
LACTATE BLOOD ARTERIAL: 2.8 mmol/L — ABNORMAL HIGH (ref <1.3)
O2 SATURATION ARTERIAL: 95 % (ref 94.0–100.0)
PCO2 ARTERIAL: 54 mmHg — ABNORMAL HIGH (ref 35.0–45.0)
PH ARTERIAL: 7.31 — ABNORMAL LOW (ref 7.35–7.45)
PO2 ARTERIAL: 83.2 mmHg (ref 80.0–110.0)
POTASSIUM WHOLE BLOOD: 4.4 mmol/L (ref 3.4–4.6)
SODIUM WHOLE BLOOD: 136 mmol/L (ref 135–145)

## 2022-09-12 MED ORDER — AZITHROMYCIN 250 MG TABLET
ORAL_TABLET | Freq: Once | ORAL | 2 refills | 1 days | PRN
Start: 2022-09-12 — End: 2022-09-12

## 2022-09-12 MED ORDER — CLOPIDOGREL 75 MG TABLET
ORAL_TABLET | Freq: Every day | ORAL | 2 refills | 30 days
Start: 2022-09-12 — End: 2022-12-11

## 2022-09-12 MED ADMIN — phenylephrine 0.8 mg/10 mL (80 mcg/mL) injection: INTRAVENOUS | @ 13:00:00 | Stop: 2022-09-12

## 2022-09-12 MED ADMIN — phenylephrine 0.8 mg/10 mL (80 mcg/mL) injection: INTRAVENOUS | @ 14:00:00 | Stop: 2022-09-12

## 2022-09-12 MED ADMIN — fentaNYL (PF) (SUBLIMAZE) injection: INTRAVENOUS | @ 15:00:00 | Stop: 2022-09-12

## 2022-09-12 MED ADMIN — ePHEDrine (PF) 25 mg/5 mL (5 mg/mL) in 0.9% sodium chloride syringe Syrg: INTRAVENOUS | @ 14:00:00 | Stop: 2022-09-12

## 2022-09-12 MED ADMIN — Propofol (DIPRIVAN) injection: INTRAVENOUS | @ 14:00:00 | Stop: 2022-09-12

## 2022-09-12 MED ADMIN — ketorolac (TORADOL) injection 15 mg: 15 mg | INTRAVENOUS | Stop: 2022-09-12

## 2022-09-12 MED ADMIN — vancomycin (VANCOCIN) IVPB 1000 mg (premix): 1000 mg | INTRAVENOUS | @ 19:00:00 | Stop: 2022-09-13

## 2022-09-12 MED ADMIN — midazolam (VERSED) injection: INTRAVENOUS | @ 13:00:00 | Stop: 2022-09-12

## 2022-09-12 MED ADMIN — succinylcholine (ANECTINE) injection: INTRAVENOUS | @ 13:00:00 | Stop: 2022-09-12

## 2022-09-12 MED ADMIN — lidocaine (PF) (XYLOCAINE-MPF) 20 mg/mL (2 %) injection: SUBCUTANEOUS | @ 14:00:00 | Stop: 2022-09-12

## 2022-09-12 MED ADMIN — haloperidol LACTATE (HALDOL) injection: INTRAVENOUS | @ 13:00:00 | Stop: 2022-09-12

## 2022-09-12 MED ADMIN — Propofol (DIPRIVAN) injection: INTRAVENOUS | @ 13:00:00 | Stop: 2022-09-12

## 2022-09-12 MED ADMIN — heparin (porcine) 1000 unit/mL injection: INTRAVENOUS | @ 14:00:00 | Stop: 2022-09-12

## 2022-09-12 MED ADMIN — fentaNYL (PF) (SUBLIMAZE) injection: INTRAVENOUS | @ 13:00:00 | Stop: 2022-09-12

## 2022-09-12 MED ADMIN — ondansetron (ZOFRAN) injection: INTRAVENOUS | @ 15:00:00 | Stop: 2022-09-12

## 2022-09-12 MED ADMIN — ePHEDrine (PF) 25 mg/5 mL (5 mg/mL) in 0.9% sodium chloride syringe Syrg: INTRAVENOUS | @ 13:00:00 | Stop: 2022-09-12

## 2022-09-12 MED ADMIN — lidocaine (XYLOCAINE) 20 mg/mL (2 %) injection: INTRAVENOUS | @ 13:00:00 | Stop: 2022-09-12

## 2022-09-12 MED ADMIN — glycopyrrolate (ROBINUL) injection: INTRAVENOUS | @ 14:00:00 | Stop: 2022-09-12

## 2022-09-12 MED ADMIN — protamine injection: INTRAVENOUS | @ 15:00:00 | Stop: 2022-09-12

## 2022-09-12 MED ADMIN — dexAMETHasone (DECADRON) 4 mg/mL injection: INTRAVENOUS | @ 13:00:00 | Stop: 2022-09-12

## 2022-09-12 MED ADMIN — spironolactone (ALDACTONE) tablet 100 mg: 100 mg | ORAL | @ 22:00:00

## 2022-09-12 MED ADMIN — clopidogrel (PLAVIX) tablet 300 mg: 300 mg | ORAL | @ 19:00:00 | Stop: 2022-09-12

## 2022-09-12 MED ADMIN — lactated Ringers infusion: INTRAVENOUS | @ 13:00:00 | Stop: 2022-09-12

## 2022-09-12 MED ADMIN — furosemide (LASIX) tablet 40 mg: 40 mg | ORAL | @ 19:00:00

## 2022-09-12 MED ADMIN — clindamycin (CLEOCIN) 900 mg/50 mL IVPB: INTRAVENOUS | @ 13:00:00 | Stop: 2022-09-12

## 2022-09-12 NOTE — Unmapped (Signed)
Received pt post ASD closure.denies pain and SOB.Intact R groin dressing.No signs of hematoma and bleeding at site.Stable VSS.

## 2022-09-12 NOTE — Unmapped (Signed)
Operative Note  (CSN: [id number])      Date of Surgery: 09/12/2022    Pre-op Diagnosis: ASD    Post-op Diagnosis: same       [Note: Revisions to procedures should be made in chart - see Procedures tab.]    ASD closure    Performing Service: Cardiology  Surgeons and Role:     * Jacquelyne Balint, MD - Primary     * Rasim Hector Brunswick, MD - Fellow - Interventional    Assistant: None    Anesthesia: General    Estimated Blood Loss: 10cc    Complications: None    Specimens: None collected    Findings:     Successful ASD closure with a Gore ASD occluder 32 mm.  TEE guided.    Surgeon Notes:  I was present during the whole procedure.    Jacquelyne Balint, MD   Date: 09/12/2022  Time: 1:10 PM

## 2022-09-12 NOTE — Unmapped (Signed)
DIVISION OF CARDIOLOGY  [facility] [place]                                                                 Date of Service: 07/19/2022     PCP: Referring Provider:   Cathey Endow Heywood Footman, MD  [phone] [address] [facility] [place] [address]  Phone: [phone]  Fax: [phone] Bowen, Heywood Footman, MD  [address]  [facility] [place],  [place] [address]  Phone: [phone]  Fax: [phone]          Cardiac Catheterization Laboratory  [facility], [place]  Tel: [phone]   Fax: [phone]    PRE-PROCEDURE H&P    Assessment and Plan     Denise York is a 57 y.o. year old female with presents for an ASD closure    Risks and benefits of the procedure were discussed with the patient. Informed consent was obtained.    We will proceed with the procedure, , as planned.    Dispo: Plan to admit the patient post-procedure  Code Status verified and updated in EPIC.            History     History of Present Illness    Denise York is a 57 y.o. year old female with history of ASD who presents  for an elective ASD closure. She has a shellfish allergy, however she had contrast in January and did not require any intervention.    Pertinent Cardiac Studies  MPRESSION:  1.Non flow-limiting mild coronary disease with focal calcified plaque in the ostial RCA causing mild (25-49%) stenosis and mixed focal plaque in the mid-distal LAD causing mild (25-49%) stenosis.  2.Incidental 10 x 5 mm secundum atrial septal defect with biatrial enlargement.  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    Medications Received  Medications   lidocaine (LMX) 4 % cream (has no administration in time range)   fentaNYL (PF) (SUBLIMAZE) 50 mcg/mL injection (has no administration in time range)   midazolam (VERSED) 1 mg/mL injection (has no administration in time range)       Review of Systems  10 Systems reviewed and negative other than noted above.    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    Obesity     Prediabetes      Past Surgical History:   Procedure Laterality Date    ABDOMINAL WALL MESH  REMOVAL  1997, 2005    Placement, 1997, removal and replacement 2005    BLADDER SURGERY      bladder tact X2    BREAST BIOPSY Right 04/2013    benign    BREAST EXCISIONAL BIOPSY Right 05/2013    benign    BREAST SURGERY Right     precancerous spot removed    CHOLECYSTECTOMY      HYSTERECTOMY      PR COLSC  FLX W/RMVL OF TUMOR POLYP LESION SNARE TQ N/A 10/14/2016    Procedure: COLONOSCOPY FLEX; W/REMOV TUMOR/LES BY SNARE;  Surgeon: Alfred Levins, MD;  Location: [facility] [place];  Service: Gastroenterology    SKIN BIOPSY        Home Medications  Medications Prior to Admission   Medication Sig Dispense Refill Last Dose    aspirin (ECOTRIN) 81 MG tablet Take 1 tablet (81 mg total) by mouth daily.   09/12/2022    blood sugar diagnostic (GLUCOSE BLOOD) Strp Use to test blood sugar daily 100 strip 3 09/12/2022    blood-glucose meter kit Use to test blood sugar DAILY 1 each 1 09/12/2022    carvediloL (COREG) 6.25 MG tablet Take 1 tablet (6.25 mg total) by mouth two (2) times a day. 180 tablet 3 09/12/2022    cholecalciferol, vitamin D3, 2,000 unit cap Take 2 capsules (4,000 Units total) by mouth daily. (Patient taking differently: Take 1 capsule (50 mcg total) by mouth daily.) 1 each 0 09/12/2022    DULoxetine (CYMBALTA) 20 MG capsule Take 1 capsule (20 mg total) by mouth daily. 90 capsule 1 09/12/2022    furosemide (LASIX) 40 MG tablet Take 1 tablet (40 mg total) by mouth daily. 90 tablet 0 09/11/2022    inhalational spacing device (AEROCHAMBER MV) Spcr Use as directed with inhalers 1 each 0 Unknown lancets Misc Use to test blood sugar daily 100 each 3 09/12/2022    loratadine (CLARITIN) 10 mg tablet TAKE 1 TABLET BY MOUTH EVERY DAY 90 tablet 1 Unknown    magnesium oxide (MAG-OX) 400 mg (241.3 mg elemental magnesium) tablet Take 1 tablet (400 mg total) by mouth daily.   09/12/2022    metFORMIN (GLUCOPHAGE-XR) 500 MG 24 hr tablet Take 1 tablet (500 mg total) by mouth daily with evening meal. 90 tablet 1 Past Week    multivitamin (TAB-A-VITE/THERAGRAN) per tablet Take 1 tablet by mouth daily.   09/12/2022    psyllium seed, with sugar, (FIBER ORAL) Take 4 capsules by mouth daily. BJs brand.   09/11/2022    rifAXIMin (XIFAXAN) 550 mg Tab Take 1 tablet (550 mg total) by mouth two (2) times a day. 180 tablet 3 09/12/2022    rOPINIRole (REQUIP) 0.25 MG tablet Take 1 tablet (0.25 mg total) by mouth two (2) times a day. 180 tablet 1 09/12/2022    spironolactone (ALDACTONE) 100 MG tablet Take 1 tablet (100 mg total) by mouth daily. 90 tablet 0 09/11/2022    traZODone (DESYREL) 50 MG tablet TAKE 1 TABLET BY MOUTH EVERY DAY AT NIGHT 90 tablet 3 Past Week    semaglutide (OZEMPIC) 2 mg/dose (8 mg/3 mL) PnIj Inject 2 mg under the skin every seven (7) days. 9 mL 3 09/08/2022      Allergies is allergic to penicillins, shellfish containing products, glipizide, and lisinopril.   Family History family history includes Allergies in her father and another family member; Coronary artery disease in an other family member; Gout in her father; Heart disease in her father and mother; Hypertension in her mother; Kidney disease in her father; Kidney failure in an other family member; No Known Problems in her daughter, maternal grandfather, maternal grandmother, paternal grandfather, paternal grandmother, and sister.   Social History Alcohol use:  reports no history of alcohol use.  Tobacco use:  reports that she has never smoked. She has never used smokeless tobacco.  Drug use:  reports no history of drug use.     Objective  Temp:  [36.5 ??C (97.7 ??F)] 36.5 ??C (97.7 ??F)  Heart Rate:  [77] 77  Resp:  [18] 18  BP: (112)/(63) 112/63  SpO2:  [96 %] 96 %  BMI (Calculated):  [38.26] 38.26  Wt Readings from Last 4 Encounters:   09/12/22 (!) 101.2 kg (223 lb)   09/10/22 (!) 101.2 kg (223 lb)   08/14/22 (!) 103.5 kg (228 lb 3.2 oz)   07/19/22 106.6 kg (235 lb)     Physical Exam  GEN: Aao x3   HEENT: EOMI, sclera anicteric, conjunctiva noninjected  Respiratory: Normal WOB, CTABL  Cardiovascular: RRR, no m/r/g, JVP   Abdominal: Soft, NT, ND  Extremities: Warm, well perfused, no cyanosis,  edema, pulses   Neuro: CN II - XII grossly intact  Psych: appropriate, normal affect    Recent laboratory values, ECG, and imaging reviewed prior to procedure.        ______________________________________________________________________________________________     ASSESSMENT AND PLAN:   Atrial Septal Defect  - TEE 1/10 w/ moderate left to right shunting - measured 1 cm x 0.4 cm. Adequate rims noted for percutaneous closure  - CTA notes incidental 1 cm x 0.5 cm secundum atrial septal defect with biatrial enlargement.  - Experiencing lower extremity edema - lasix 40 mg/spironolactone 100mg  started by hepatology/PCP is helping    2. CAD  07/09/22 CTA cardiac shows non flow limiting mild CAD with focal calcified plaque in the ostial RCA causing mild (25-49%) stenosis and mixed focal plaque in the mid-distal LAD causing mild (25-49%) stenosis.      3. Cirrhosis  Follows with Dr. Eudelia Bunch hepatology for cirrhosis complicated by portal hypertension and hepatic encephalopathy. Has had more leg swelling, now on lasix. Suspect some of the hepatic congestion due to elevated right sided pressures/congestive hepatology due to ASD physiology. Hopeful some of this will improve with ASD closure, although there is likely fatty liver component as well   - Lasix, spiro, coreg, lactulose per hepatology      No follow-ups on file.  ______________________________________________________________________________________________        SUBJECTIVE:      HISTORY OF PRESENT ILLNESS       I had the pleasure of seeing Denise York in our adult general cardiology clinic today for follow up visit regarding an atrial septal defect needing closure referred by Dr. Cathey Endow, Heywood Footman, MD.  She is a 57 y.o. year-old patient with a PMH of diabetes, family history of CAD and obesity.       For the past few years she's had increasing fatigue, DOE, orthopnea, intermittent exertional chest pain. This has worsened in the past 6-8 months, and now she cannot do simple things she did before including going to the grocery store. When cleaning her house, has to rest between every room due to shortness of breath. She has chest tightness when exerting herself which resolves with rest.  Also feels extra beats with exertion. She's had 2 sleep studies which she reports did not show OSA. 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 Also taking lactulose, though only has BM once week. Feels her thinking is cloudy and friend who accompanies her today feels she gets confused.      PCP workup included echo which showed ASD, RV mod dilated in size, RA mod dilated in size. She had a TEE which showed ostium secundum  ASD with moderate left to right shunting - measured 1 cm x 0.4 cm. Adequate rims noted for percutaneous closure.      Since she began diuretics 1-2 weeks ago she has experienced significant improvement in lower extremity swelling. She is taking 40mg  lasix and 100mg  spironalactone daily. Her shoe was an 8, increased to size 11, since diuretics now back to an 8. She's lost 10 lbs in that time. She does not feel the diuretics helped with her SOB. She is experiencing some leg cramping. She reports no lightheadedness, dizziness, palpitations, or syncope.  Denise York states compliance with her medications and denies any untoward side effects from it.       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 Walking across campus is difficult due to DOE. She is currently on FMLA.     CARDIOVASCULAR HISTORY AND PROCEDURES     Cardiovascular Studies Date Comments      ECG 12/23 NSR   Echo 2024 Ostium secundum ASD with moderate left to right shunting. Measured 1 cm x 0.4 cm.    Stress test       Cardiac catheterization       Electrophysiology        Cardiovascular Surgery       Peripheral Vascular Studies             I have reviewed the cardiology tests personally.       PAST MEDICAL HISTORY  Past Medical History        Past Medical History:   Diagnosis Date    Back pain      Depressed      Fatigue      Joint pain      Obesity      Prediabetes              ALLERGIES  Penicillins, Glipizide, and Lisinopril        CURRENT MEDICATIONS  Current Medications          Current Outpatient Medications   Medication Sig Dispense Refill    aspirin (ECOTRIN) 81 MG tablet Take 1 tablet (81 mg total) by mouth daily.        blood sugar diagnostic (GLUCOSE BLOOD) Strp Use to test blood sugar daily 100 strip 3    blood-glucose meter kit Use to test blood sugar DAILY 1 each 1    carvediloL (COREG) 6.25 MG tablet Take 1 tablet (6.25 mg total) by mouth two (2) times a day. 180 tablet 3    cholecalciferol, vitamin D3, 2,000 unit cap Take 2 capsules (4,000 Units total) by mouth daily. (Patient taking differently: Take 1 capsule (50 mcg total) by mouth daily.) 1 each 0    dulaglutide (TRULICITY) 0.75 mg/0.5 mL injection pen Inject 0.5 mL (0.75 mg total) under the skin once a week. 6 mL 3    DULoxetine (CYMBALTA) 20 MG capsule Take 1 capsule (20 mg total) by mouth daily. 90 capsule 1    furosemide (LASIX) 40 MG tablet Take 1 tablet (40 mg total) by mouth daily. 90 tablet 0    inhalational spacing device (AEROCHAMBER MV) Spcr Use as directed with inhalers 1 each 0    lactulose 20 gram/30 mL Soln Take 30 mL (20 g total) by mouth Three (3) times a day. Titrate to 3-4 bowel movements daily. 2700 mL 11    lancets Misc Use to test blood sugar daily 100 each 3  loratadine (CLARITIN) 10 mg tablet TAKE 1 TABLET BY MOUTH EVERY DAY 90 tablet 1    magnesium oxide (MAG-OX) 400 mg (241.3 mg elemental magnesium) tablet Take 1 tablet (400 mg total) by mouth daily.        metFORMIN (GLUCOPHAGE-XR) 500 MG 24 hr tablet Take 1 tablet (500 mg total) by mouth daily with evening meal. 90 tablet 1    multivitamin (TAB-A-VITE/THERAGRAN) per tablet Take 1 tablet by mouth daily.        rOPINIRole (REQUIP) 0.25 MG tablet Take 1 tablet (0.25 mg total) by mouth two (2) times a day. 180 tablet 1    spironolactone (ALDACTONE) 100 MG tablet Take 1 tablet (100 mg total) by mouth daily. 90 tablet 0    traZODone (DESYREL) 50 MG tablet TAKE 1 TABLET BY MOUTH EVERY DAY AT NIGHT 90 tablet 3    albuterol HFA 90 mcg/actuation inhaler Inhale 2 puffs every six (6) hours as needed for wheezing. 8 g 5    budesonide (PULMICORT FLEXHALER) 180 mcg/actuation inhaler Inhale 2 puffs two (2) times a day. 3 each 3      No current facility-administered medications for this visit.            FAMILY HISTORY  Negative for early CAD     SOCIAL HISTORY  She  reports that she has never smoked. She has never used smokeless tobacco. She reports that she does not drink alcohol and does not use drugs.        REVIEW OF SYSTEMS     Review of Systems - 10 systems were reviewed and negative except as noted in HPI     Constitutional: negative for - chills, fatigue, fever or night sweats  ENT ROS: negative for - vertigo or visual changes  Hematological and Lymphatic ROS: negative for - blood transfusions, jaundice, night sweats or swollen lymph nodes  Endocrine ROS: negative for - skin changes or temperature intolerance  Respiratory ROS: no cough, shortness of breath, or wheezing negative for - hemoptysis, orthopnea, shortness of breath, tachypnea or wheezing  Cardiovascular ROS:  As reported in HPI  Gastrointestinal ROS: negative for - abdominal pain, hematemesis, melena, nausea/vomiting or swallowing difficulty/pain  Genito-Urinary ROS: negative for - dysuria, incontinence or nocturia  Musculoskeletal ROS: negative for - gait disturbance, joint pain, muscle pain or muscular weakness  Neurological ROS: negative for - behavioral changes, bowel and bladder control changes, headaches, seizures or speech problems     Vitals:    09/12/22 0654   BP: 112/63   Pulse: 77   Resp: 18   Temp: 36.5 ??C (97.7 ??F)   SpO2: 96%

## 2022-09-12 NOTE — Unmapped (Signed)
Denise York is a 57 y.o. female who was admitted on 09/12/2022 secondary to atrial septal defect s/p septal occluder device placement.  She had no events post procedure. Right groin access site was without bleeding or hematoma.  She will be discharged to home today. She will follow-up in 1 months for a clinic appointment and echo with bubble study. On discharge the patient will continue plavix and aspirin. She will need antibiotics prior to dental procedures.

## 2022-09-12 NOTE — Unmapped (Signed)
CARDIOLOGY POST PROCEDURE PROGRESS NOTE:   Admit Date: September 12, 2022         Attending: Jacquelyne Balint, MD    Admitting Service: Cards Procedures (MDS)    Admitting Diagnosis:  ASD (atrial septal defect) [Q21.10]    Patient seen and evaluated.  I agree with H&P as recorded.     Femoral venous and arterial puncture site(s) with dressing(s) clean, dry and intact. No bleeding or hematoma noted.       Procedure:  09/12/2022  Percutaneous Atrial Septal Defect (ASD) closure device implant utilizing the Gore septal occluder. See cath report for details.       ASSESSMENT AND PLAN      ASSESSMENT AND PLAN:    Denise York is a 57 y.o.-year-old female with h/o ASD who is admitted s/pASD closure device implant.    1. ASD s/p closure device implant.   * Vital signs per protocol.   * Monitor right groin access sites for bleeding or hematoma. Perc closure device used  * Plavix 300mg  PO x 1 post procedure.   * In AM start Plavix 75mg  once daily x 3 months.  * ASA 325 mg PO x 1 given pre-procedure.     * In AM start ASA 81mg  daily and indefinitely.  * Up ad lib after bedrest complete.  * Maintain IV/Medlock per protocol.  * Check BMP, CBC in AM.   * Check 12 lead EKG in AM.   * Transthoracic echocardiogram with bubble study in AM.  * PA/LAT chest xray in AM for device placement.   * Vanc IV Q8 hours x 3 post procedure.     DISPO:  * MDS  * Anticipate discharge after labs/echo/CXR reviewed and patient remains hemodynamically stable post-procedure.      DVT/GI Prophylaxis:  * PPI ppx: GI proph not indicated as patient to be discharged <24 hours post-procedure.   * DVT ppx: anticoagulation as above and encourage ambulation after bedrest complete.    Vitals:    09/12/22 1020   BP:    Pulse: 89   Resp: 13   Temp:    SpO2: 95%     Vitals:    09/12/22 0653   Weight: (!) 101.2 kg (223 lb)         LABS:  Lab Results   Component Value Date    WBC 5.2 09/06/2022    HGB 12.7 09/12/2022    HCT 41.9 09/06/2022    PLT 53 (L) 09/06/2022       Lab Results Component Value Date    NA 136 09/12/2022    K 4.4 09/12/2022    CL 101 09/06/2022    CO2 28.1 09/06/2022    BUN 13 09/06/2022    CREATININE 0.89 09/06/2022    GLU 167 09/06/2022    CALCIUM 10.3 09/06/2022    MG 1.5 (L) 05/09/2022       Lab Results   Component Value Date    BILITOT 2.0 (H) 08/14/2022    PROT 7.1 08/14/2022    ALBUMIN 3.2 (L) 08/14/2022    ALT 41 08/14/2022    AST 51 (H) 08/14/2022    ALKPHOS 83 08/14/2022    GGT 97 (H) 05/09/2022       Lab Results   Component Value Date    INR 1.22 06/17/2022       Karna Christmas, AGACNP-BC

## 2022-09-13 ENCOUNTER — Ambulatory Visit: Admit: 2022-09-13 | Discharge: 2022-09-14 | Payer: PRIVATE HEALTH INSURANCE

## 2022-09-13 LAB — CBC
HEMATOCRIT: 35.8 % (ref 34.0–44.0)
HEMOGLOBIN: 13 g/dL (ref 11.3–14.9)
MEAN CORPUSCULAR HEMOGLOBIN CONC: 36.2 g/dL — ABNORMAL HIGH (ref 32.0–36.0)
MEAN CORPUSCULAR HEMOGLOBIN: 31.3 pg (ref 25.9–32.4)
MEAN CORPUSCULAR VOLUME: 86.5 fL (ref 77.6–95.7)
PLATELET COUNT: >45 10*9/L — ABNORMAL LOW (ref 150–450)
RED BLOOD CELL COUNT: 4.14 10*12/L (ref 3.95–5.13)
RED CELL DISTRIBUTION WIDTH: 15.8 % — ABNORMAL HIGH (ref 12.2–15.2)
WBC ADJUSTED: 7.5 10*9/L (ref 3.6–11.2)

## 2022-09-13 LAB — BASIC METABOLIC PANEL
ANION GAP: 5 mmol/L (ref 5–14)
BLOOD UREA NITROGEN: 14 mg/dL (ref 9–23)
BUN / CREAT RATIO: 15
CALCIUM: 9.6 mg/dL (ref 8.7–10.4)
CHLORIDE: 100 mmol/L (ref 98–107)
CO2: 27 mmol/L (ref 20.0–31.0)
CREATININE: 0.94 mg/dL
EGFR CKD-EPI (2021) FEMALE: 71 mL/min/{1.73_m2} (ref >=60)
GLUCOSE RANDOM: 217 mg/dL — ABNORMAL HIGH (ref 70–179)
POTASSIUM: 4.9 mmol/L — ABNORMAL HIGH (ref 3.4–4.8)
SODIUM: 132 mmol/L — ABNORMAL LOW (ref 135–145)

## 2022-09-13 LAB — PHOSPHORUS: PHOSPHORUS: 2.3 mg/dL — ABNORMAL LOW (ref 2.4–5.1)

## 2022-09-13 LAB — MAGNESIUM: MAGNESIUM: 2 mg/dL (ref 1.6–2.6)

## 2022-09-13 MED ORDER — AZITHROMYCIN 250 MG TABLET
ORAL_TABLET | Freq: Once | ORAL | 2 refills | 1 days | Status: CP | PRN
Start: 2022-09-13 — End: 2022-09-13

## 2022-09-13 MED ORDER — CLOPIDOGREL 75 MG TABLET
ORAL_TABLET | Freq: Every day | ORAL | 2 refills | 30 days | Status: CP
Start: 2022-09-13 — End: 2022-12-12

## 2022-09-13 MED ADMIN — traZODone (DESYREL) tablet 50 mg: 50 mg | ORAL | @ 01:00:00

## 2022-09-13 MED ADMIN — magnesium oxide (MAG-OX) tablet 400 mg: 400 mg | ORAL | @ 14:00:00 | Stop: 2022-09-13

## 2022-09-13 MED ADMIN — rOPINIRole (REQUIP) tablet 0.25 mg: .25 mg | ORAL | @ 14:00:00 | Stop: 2022-09-13

## 2022-09-13 MED ADMIN — carvedilol (COREG) tablet 6.25 mg: 6.25 mg | ORAL | @ 14:00:00 | Stop: 2022-09-13

## 2022-09-13 MED ADMIN — clopidogrel (PLAVIX) tablet 75 mg: 75 mg | ORAL | @ 14:00:00 | Stop: 2022-09-13

## 2022-09-13 MED ADMIN — aspirin chewable tablet 81 mg: 81 mg | ORAL | @ 14:00:00 | Stop: 2022-09-13

## 2022-09-13 MED ADMIN — spironolactone (ALDACTONE) tablet 100 mg: 100 mg | ORAL | @ 14:00:00 | Stop: 2022-09-13

## 2022-09-13 MED ADMIN — rifAXIMin (XIFAXAN) tablet 550 mg: 550 mg | ORAL | @ 14:00:00 | Stop: 2022-09-13

## 2022-09-13 MED ADMIN — rifAXIMin (XIFAXAN) tablet 550 mg: 550 mg | ORAL | @ 01:00:00 | Stop: 2022-09-19

## 2022-09-13 MED ADMIN — heparin (porcine) 5,000 unit/mL injection 5,000 Units: 5000 [IU] | SUBCUTANEOUS | @ 01:00:00

## 2022-09-13 MED ADMIN — rOPINIRole (REQUIP) tablet 0.25 mg: .25 mg | ORAL | @ 01:00:00

## 2022-09-13 MED ADMIN — carvedilol (COREG) tablet 6.25 mg: 6.25 mg | ORAL | @ 01:00:00

## 2022-09-13 MED ADMIN — furosemide (LASIX) tablet 40 mg: 40 mg | ORAL | @ 14:00:00 | Stop: 2022-09-13

## 2022-09-13 NOTE — Unmapped (Signed)
Cardiac Catheterization Laboratory  [facility], [place]  Tel: [phone]   Fax: [phone]              Patient: Denise York;  [id number]   MRN:  [id number]  DOB: July 11, 1965  Age: 57 y.o.  Date of Procedure: 09/12/2022    Attending:  Jacquelyne Balint, MD    PCP:  Marca Ancona, MD    Phone:  [phone]  Fax:  [phone]    Indication:  JOLLY TALAMANTE is a 57 y.o. female with a history of ASD who was referred for percutaneous ASD closure.       Findings:  10-12 mm ASD secundum defect noted.  S/p 32 mm cardio form device implantation.     Recommendations:  Admit to 3 [facility] overnight, Med S   Ancef 2 g IV q8hr x 3 doses  Plavix 300mg  PO x 1 now  PA and Lat CXR in the AM  Full echo with bubble study in the AM  ASA 81mg  and Plavix 75 mg x 3 months, ASA 325 mg daily thereafter  2 G of amoxicillin one hour prior to dental work indefinitely  Follow-up in structural heart clinic in one month with an echo with bubble study.  ____________________________________________________________________________  Performing Attending: Jacquelyne Balint, MD  Interventional Fellow: Hector Brunswick  Procedures performed: Right heart Catheterization, Shunt Run, ASD Closure,  TEE Guidance.  Arterial Closure: None  Venous Access Site: Right Femoral Vein      Right Heart Catheterization  RA mean: 18 mmHg   RV: 38/8 mmHg  PA (mean): 30/14 (22) mmHg  PCWP mean: 15 mmHg     Shunt Run:  High SVC: 84%  Low SVC: 84%  RA: 86%  IVC: 82%  RV: 87%  PA1: 84%     Qp/Qs = 6.25  Likely augmented due to patient's PA O2 being high due to mechanical ventilation and also underlying hepatopulmonary hypertension        ASD Closure:  After informed consent, the patient was taken into the Cath Lab for planned percutaneous ASD closure. A time out was performed and preoperative antibiotics are given intravenously.   The patient was placed under general anesthesia and a TEE probe was passed without difficulty for continuous TEE imaging guidance.  Vascular access was obtained using micropuncture under fluoroscopic guidance. An 11Fr Terumo sheath was placed into the RFV. A 5 Fr Cordis sheath was placed into the RFA. Under the 11Fr Terumo, a 9Fr sheath was placed in the right femoral vein.  A 7Fr Medtronic PWP catheter was taken up through the 11Fr sheath an a right heart cath was performed. IV UFH was given and ACTs were checked thoughout and heparin redosed to maintain an ACT over 250 sec. A shunt run was performed as detailed above. A J wire was then used to cross the defect under TEE guidance. The Medtronic PWP catheter was taken into the LUPV. An Extra Stiff wire with a J tip was then exchanged for and passed into the LUPV. A 24 mm PTS sizing balloon was advanced over the defect and inflated until color flow on TEE stopped across the defect. By angiography, the defect measured to be about 15 mm in size. By TEE, the defect measured about 12 mm in size. A 32 mm Gore Cardioform device was selected. It easily crossed the defect into the LA. The LA disc was deployed and pulled back against the LA side  of the interatrial septum.  We then deployed the RA disc on the RA side and visualized that the LA disc was fully on the LA side and the RA disc fully on the RA side. There was no residual shunt on TEE through the defect before protamine was administered. After many wiggle attempts, the device appeared stable in position. Angiographically, the device appeared in a stable position. By TEE, the device seemed to have good purchase of rim tissue all around. The device was then released and appeared stable with good seal across the ASD. The device appeared stable on fluoro. There appeared to be plenty of buffer room posteriorly. Anteriorly, the device was without penetrating or pushing into the aorta.     The patient was transferred to cath lab holding for recovery and will be admitted to Med S.      Implants    Name ID Temporary Type Supply Occluder 32mm Laurey Arrow - [id number] No Mesh Occluder 32mm Cardioform Asd Raeanne Gathers

## 2022-09-13 NOTE — Unmapped (Signed)
D/C teaching reviewed with pt, she verbalized understanding of teaching. Questions invited and answered. Dressing to R groin access site CDI. Pt OOB to get dressed and ambulate without difficulty. PIV removed. Pt taken to d/c lobby via wheelchair by transporter. Pt instructed not to drive for 24 hours, reports ride will be picking them up after her appointment at [facility]. She is going to take shuttle to her appointment at [facility] then her sister will be picking her up.

## 2022-09-13 NOTE — Unmapped (Signed)
Physician Discharge Summary    Admit date: 09/12/2022    Discharge date: 09/13/2022    Discharge to: Home    Discharge Service: Cards Procedures (MDS)    Discharge Attending Physician: Jacquelyne Balint, MD    Discharge Diagnoses: ASD     Procedures: ASD repair with septal occluder device placement    Pertinent Test Results: radiology: CXR: normal and cardiac graphics: Echocardiogram: reviewed and cleared by attending physician    Hospital Course:    Denise York is a 57 y.o. female who was admitted on 09/12/2022 secondary to atrial septal defect s/p septal occluder device placement.  She had no events post procedure. Right groin access site was without bleeding or hematoma.  She will be discharged to home today. She will follow-up in 1 months for a clinic appointment and echo with bubble study. On discharge the patient will continue plavix and aspirin. She will need antibiotics prior to dental procedures.        Condition at Discharge: stable  Discharge Medications:      Your Medication List        START taking these medications      azithromycin 250 MG tablet  Commonly known as: ZITHROMAX  Take 2 tablets (500 mg total) by mouth 1 (one) time if needed (take 30 minutes prior to dental procedures) for up to 1 dose.     clopidogrel 75 mg tablet  Commonly known as: PLAVIX  Take 1 tablet (75 mg total) by mouth daily.            CONTINUE taking these medications      AEROCHAMBER MV inhaler  Generic drug: inhalational spacing device  Use as directed with inhalers     aspirin 81 MG tablet  Commonly known as: ECOTRIN  Take 1 tablet (81 mg total) by mouth daily.     blood-glucose meter kit  Use to test blood sugar DAILY     carvedilol 6.25 MG tablet  Commonly known as: COREG  Take 1 tablet (6.25 mg total) by mouth two (2) times a day.     cholecalciferol (vitamin D3-50 mcg (2,000 unit)) 50 mcg (2,000 unit) Cap  Take 2 capsules (4,000 Units total) by mouth daily.     DULoxetine 20 MG capsule  Commonly known as: CYMBALTA  Take 1 capsule (20 mg total) by mouth daily.     FIBER ORAL  Take 4 capsules by mouth daily. BJs brand.     furosemide 40 MG tablet  Commonly known as: LASIX  Take 1 tablet (40 mg total) by mouth daily.     glucose blood test strip  Generic drug: blood sugar diagnostic  Use to test blood sugar daily     lancets Misc  Use to test blood sugar daily     loratadine 10 mg tablet  Commonly known as: CLARITIN  TAKE 1 TABLET BY MOUTH EVERY DAY     magnesium oxide 400 mg (241.3 mg elemental) tablet  Commonly known as: MAG-OX  Take 1 tablet (400 mg total) by mouth daily.     metFORMIN 500 MG 24 hr tablet  Commonly known as: GLUCOPHAGE-XR  Take 1 tablet (500 mg total) by mouth daily with evening meal.     multivitamin per tablet  Commonly known as: TAB-A-VITE/THERAGRAN  Take 1 tablet by mouth daily.     OZEMPIC 2 mg/dose (8 mg/3 mL) Pnij  Generic drug: semaglutide  Inject 2 mg under the skin every seven (7) days.  rOPINIRole 0.25 MG tablet  Commonly known as: REQUIP  Take 1 tablet (0.25 mg total) by mouth two (2) times a day.     spironolactone 100 MG tablet  Commonly known as: ALDACTONE  Take 1 tablet (100 mg total) by mouth daily.     traZODone 50 MG tablet  Commonly known as: DESYREL  TAKE 1 TABLET BY MOUTH EVERY DAY AT NIGHT     XIFAXAN 550 mg Tab  Generic drug: rifAXIMin  Take 1 tablet (550 mg total) by mouth two (2) times a day.            Discharge Instructions:   Activity Instructions       Ice to affected area (specify)      Apply to groin puncture sites as needed for pain/discomfort. This will also minimize bruising.    Lifting restrictions (specify)      Weight restriction of 10 lbs x 7 days.    OK to shower (no bath)      24 hours after procedure complete          Other Instructions       Discharge instructions      DISCHARGE INSTRUCTIONS:    MEDICATIONS:  * Take all of your medications as prescribed.  * You have been prescribed Plavix (Clopidogrel) in combination with an Aspirin 81mg .   * Continue Plavix (Clopidogrel) for three months.   * Continue Aspirin 81 mg daily indefinitely.   * Plavix is EXTREMELY IMPORTANT.   * YOU MUST TAKE THIS MEDICATION EVERY DAY to ensure that the closure device in your heart does not develop clots.   * DO NOT STOP THIS MEDICATION FOR ANY REASON UNLESS YOUR CARDIOLOGIST INSTRUCTS YOU TO DO SO.   * You have also been prescribed an antibiotic to take before any dental cleaning or procedure.      ACTIVITY:  * Do not drive for 24 hours.  * You may shower. Clean gently over the site.  * If you start bleeding from the groin, lay flat and hold direct, firm pressure over the site for 15 minutes. If you are still bleeding after this or have uncontrolled bleeding, go to the nearest Emergency Room or call 911.  * Take it easy for 7-10 days. Do not lift more than 10 pounds for 7-10 days or until the scab falls off and the skin has healed over the groin site. After 7-10 days, you can go back to normal activity. Ease back into exercise- start with walking briskly and increase level of exercise if you feel up to it.    FOLLOW-UP APPOINTMENTS:  * An appointment will be arranged for you to have an echocardiogram with bubble study and follow-up visit in one month. You will receive this appointment by mail.     IMPORTANT PHONE NUMBERS:  * Appointments: Questions about 1st follow-up appointment.  [phone]  [facility] Cath Lab: Report signs of swelling or bleeding at your groin. [phone]  [facility] Operator: After-hour emergencies [phone] & ask for the Cardiology Fellow On-Call    Remove dressing in 24 hours      May remove dressing(s) from groin puncture site(s) 24 hours after procedure.        Follow Up instructions and Outpatient Referrals     Discharge instructions        Appointments which have been scheduled for you      Sep 13, 2022  2:00 PM  (Arrive by 1:45  PM)  ORTHOP DME with [facility] REGION) [address] [place] [address]  [phone]        Sep 26, 2022  1:00 PM  (Arrive by 12:30 PM)  RETURN  HEPATOLOGY with Suzie Portela, PA  [facility] LIVER TRANSPL[facility] REGION) [address]  [place] [address]  [phone]        Sep 27, 2022 11:00 AM  RETURN CARDIOLOGY with Farrel Conners, MD  [facility] CARDIOLOGY AT [facility] REGION) [address] [place] [address]  [phone]        Nov 08, 2022  2:45 PM  (Arrive by 2:30 PM)  ECHOCARDIOGRAM FOLLOW UP/LIMITED ECHO WITH CONTRAST with ET FL 1 OP ECHO RM 2  IMG ECHO [place] [address]  [facility] 1 through 4  [place] [address]  [phone]        Nov 08, 2022  4:00 PM  (Arrive by 3:45 PM)  RETURN CARDIOLOGY with Jacquelyne Balint, MD  [facility] CARDIOLOGY [place] [facility] REGION) [address]  [facility] 1 through 4  [place] [address]  [phone]        Nov 21, 2022  2:00 PM  (Arrive by 1:45 PM)  RETURN CONTINUITY with Marca Ancona, MD  [facility]) [address] [place] [address]  [phone]                I spent greater than 30 minutes in the discharge of this patient.    Karna Christmas, AGACNP-BC

## 2022-09-26 ENCOUNTER — Ambulatory Visit: Admit: 2022-09-26 | Discharge: 2022-09-27 | Payer: PRIVATE HEALTH INSURANCE

## 2022-09-26 DIAGNOSIS — E119 Type 2 diabetes mellitus without complications: Principal | ICD-10-CM

## 2022-09-26 DIAGNOSIS — K7460 Unspecified cirrhosis of liver: Principal | ICD-10-CM

## 2022-09-26 DIAGNOSIS — K7581 Nonalcoholic steatohepatitis (NASH): Principal | ICD-10-CM

## 2022-09-26 DIAGNOSIS — K5900 Constipation, unspecified: Principal | ICD-10-CM

## 2022-09-26 DIAGNOSIS — E559 Vitamin D deficiency, unspecified: Principal | ICD-10-CM

## 2022-09-26 DIAGNOSIS — R112 Nausea with vomiting, unspecified: Principal | ICD-10-CM

## 2022-09-26 DIAGNOSIS — K7682 Hepatic encephalopathy (CMS-HCC): Principal | ICD-10-CM

## 2022-09-26 LAB — CBC
HEMATOCRIT: 40.4 % (ref 34.0–44.0)
HEMOGLOBIN: 14.1 g/dL (ref 11.3–14.9)
MEAN CORPUSCULAR HEMOGLOBIN CONC: 34.9 g/dL (ref 32.0–36.0)
MEAN CORPUSCULAR HEMOGLOBIN: 30.9 pg (ref 25.9–32.4)
MEAN CORPUSCULAR VOLUME: 88.6 fL (ref 77.6–95.7)
MEAN PLATELET VOLUME: 10 fL (ref 6.8–10.7)
PLATELET COUNT: 82 10*9/L — ABNORMAL LOW (ref 150–450)
RED BLOOD CELL COUNT: 4.56 10*12/L (ref 3.95–5.13)
RED CELL DISTRIBUTION WIDTH: 16.4 % — ABNORMAL HIGH (ref 12.2–15.2)
WBC ADJUSTED: 7.3 10*9/L (ref 3.6–11.2)

## 2022-09-26 LAB — HEMOGLOBIN A1C
ESTIMATED AVERAGE GLUCOSE: 174 mg/dL
HEMOGLOBIN A1C: 7.7 % — ABNORMAL HIGH (ref 4.8–5.6)

## 2022-09-26 LAB — COMPREHENSIVE METABOLIC PANEL
ALBUMIN: 3.1 g/dL — ABNORMAL LOW (ref 3.4–5.0)
ALKALINE PHOSPHATASE: 85 U/L (ref 46–116)
ALT (SGPT): 38 U/L (ref 10–49)
ANION GAP: 7 mmol/L (ref 5–14)
AST (SGOT): 67 U/L — ABNORMAL HIGH (ref <=34)
BILIRUBIN TOTAL: 2 mg/dL — ABNORMAL HIGH (ref 0.3–1.2)
BLOOD UREA NITROGEN: 11 mg/dL (ref 9–23)
BUN / CREAT RATIO: 13
CALCIUM: 9.9 mg/dL (ref 8.7–10.4)
CHLORIDE: 105 mmol/L (ref 98–107)
CO2: 26 mmol/L (ref 20.0–31.0)
CREATININE: 0.88 mg/dL
EGFR CKD-EPI (2021) FEMALE: 77 mL/min/{1.73_m2} (ref >=60)
GLUCOSE RANDOM: 141 mg/dL (ref 70–179)
POTASSIUM: 4.6 mmol/L (ref 3.5–5.1)
PROTEIN TOTAL: 7.1 g/dL (ref 5.7–8.2)
SODIUM: 138 mmol/L (ref 135–145)

## 2022-09-26 LAB — AFP TUMOR MARKER: AFP-TUMOR MARKER: 5 ng/mL (ref <=8)

## 2022-09-26 LAB — PROTIME-INR
INR: 1.3
PROTIME: 14.4 s — ABNORMAL HIGH (ref 9.9–12.6)

## 2022-09-26 NOTE — Unmapped (Signed)
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  Metabolic risk factors of obesity, type 2 diabetes mellitus, hypertension, and dyslipidemia.  Her cirrhosis has been complicated by clinically significant portal hypertension and suspected hepatic encephalopathy.  Hepatic encephalopathy not well controlled in setting of constipation and inability to increase lactulose due to nausea/vomiting. She does not have evidence of jaundice, ascites, or known history of esophageal variceal bleeding. MELD is too low for a realistic chance at a deceased donor liver transplant.    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  Continue management of metabolic risk factors with PCP.  Continue HCC surveillance every 6 months with ultrasound and AFP.  Next due 11/2022.  Increase protein intake, goal 100 g/day. Discussed using protein supplements such as Glucerna.  Avoid NSAIDs.  Tylenol up to 2 g/day is okay.  Vaccinations: We recommend that patients have vaccinations to prevent various infections that can occur, especially in the setting of having underlying liver disease. The following vaccinations should be given:  -Hepatitis A: Havrix #2 administered today.   -Hepatitis B: Heplisav-B #2 administered today.   -Influenza (yearly): Next due Fall 2024.  -Pneumococcal: Prevnar 20 administered 06/2022.  -Zoster: Shingrix series completed in 2022.  -SARS-CoV-2: Monovalent vaccine (Comirnaty) completed 04/2022.  -Tetanus: TdaP administered 06/2022.  -RSV: Recommended.    Hepatic encephalopathy (CMS-HCC)  Discussed need to  increase lactulose to produce 3-5 soft bowel movements per day. She notes nausea/vomiting with and without lactulose. Advised her to try smaller, more frequent doses of lactulose.   Continue Xifaxan twice daily.    Portal hypertension (CMS-HCC)  Continue carvedilol 6.25 mg twice daily.  She will not require EGDs for esophageal variceal surveillance if she remains on carvedilol, and carvedilol may help prevent the development of ascites.    Lower extremity edema  Continue diuretics as per PCP (On Lasix 40 mg daily and spironolactone 100 mg daily).  Limit sodium to 2000 mg daily.   Discussed use of compression stockings.    Nausea/vomiting  Likely related to Ozempic.  Re-discussed recommendation for EGD (ordered).   Referral to GI for management.    Colon cancer screening  She is due for colon cancer screening which could be completed with EGD.    Return in about 6 months (around 03/29/2023).    Subjective   History of Present Illness   Accompanied by: friend    57 y.o. female with past medical history of type 2 diabetes mellitus, obesity (BMI 39.8), hypertension, dyslipidemia, lobular carcinoma in situ of breast, and constipation presenting for consultation of abnormal liver enzymes.  Most recently, she has had elevated total bilirubin (1.7) and AST 67. However, in 2022, she had elevations of ALT > AST.  She notes significant confusion which started in March, as well as significant fatigue.  She has had intermittent lower extremity edema but no abdominal distention/ascites.      Interval History  Initial visit 06/17/2022. Since that time, she underwent ASD repair  ASD repair 09/12/2022 and was observed overnight then discharged 09/13/2022. No other hospitalizations. Notes continued memory changes and fogginess. States she has been unable to tolerate lactulose due to nausea and vomiting. Notes that she often has nausea/vomiting daily even when not taking lactulose. She has constipation and does not have regular  bowel movements. Remains on Xifaxan twice daily. Denies jaundice, abdominal pain or distention/ascites, melena, or hematochezia. Notes mild lower extremity edema.    Objective   Physical Exam   Vital Signs: BP 128/75  - Pulse 75  - Temp 36.4 ??C (97.5 ??F) (Tympanic)  - Ht 162.6 cm (5' 4.02)  - Wt 100.4 kg (221 lb 6.4 oz)  - SpO2 98%  - BMI 37.98 kg/m??   Constitutional: She is in no apparent distress  Eyes: Anicteric sclerae  Cardiovascular: Mild peripheral edema bilaterally  Gastrointestinal: Soft, non-tender, non-distended abdomen with splenomegaly  Neurologic: Awake, alert, and oriented to person, place, and time with normal speech and no    Results for orders placed or performed in visit on 09/26/22   CBC   Result Value Ref Range    WBC 7.3 3.6 - 11.2 10*9/L    RBC 4.56 3.95 - 5.13 10*12/L    HGB 14.1 11.3 - 14.9 g/dL    HCT 16.1 09.6 - 04.5 %    MCV 88.6 77.6 - 95.7 fL    MCH 30.9 25.9 - 32.4 pg    MCHC 34.9 32.0 - 36.0 g/dL    RDW 40.9 (H) 81.1 - 15.2 %    MPV 10.0 6.8 - 10.7 fL    Platelet 82 (L) 150 - 450 10*9/L   PT-INR   Result Value Ref Range    PT 14.4 (H) 9.9 - 12.6 sec    INR 1.30    Comprehensive Metabolic Panel   Result Value Ref Range    Sodium 138 135 - 145 mmol/L    Potassium 4.6 3.5 - 5.1 mmol/L    Chloride 105 98 - 107 mmol/L    CO2 26.0 20.0 - 31.0 mmol/L    Anion Gap 7 5 - 14 mmol/L    BUN 11 9 - 23 mg/dL    Creatinine 9.14 7.82 - 1.02 mg/dL    BUN/Creatinine Ratio 13     eGFR CKD-EPI (2021) Female 77 >=60 mL/min/1.13m2    Glucose 141 70 - 179 mg/dL    Calcium 9.9 8.7 - 95.6 mg/dL    Albumin 3.1 (L) 3.4 - 5.0 g/dL    Total Protein 7.1 5.7 - 8.2 g/dL    Total Bilirubin 2.0 (H) 0.3 - 1.2 mg/dL    AST 67 (H) <=21 U/L    ALT 38 10 - 49 U/L    Alkaline Phosphatase 85 46 - 116 U/L   AFP tumor marker   Result Value Ref Range    AFP-Tumor Marker 5 <=8 ng/mL   Hemoglobin A1c   Result Value Ref Range    Hemoglobin A1C 7.7 (H) 4.8 - 5.6 %    Estimated Average Glucose 174 mg/dL   Vitamin D 25 Hydroxy (25OH D2 + D3)   Result Value Ref Range    Vitamin D Total (25OH) 52.8 20.0 - 80.0 ng/mL     MELD 3.0: 14 at 09/26/2022  2:32 PM  Calculated from:  Serum Creatinine: 0.88 mg/dL (Using min of 1 mg/dL) at 09/12/6576  4:69 PM  Serum Sodium: 138 mmol/L (Using max of 137 mmol/L) at 09/26/2022  2:32 PM  Total Bilirubin: 2.0 mg/dL at 01/04/5283  1:32 PM  Serum Albumin: 3.1 g/dL at 4/40/1027  2:53 PM  INR(ratio): 1.30 at 09/26/2022  2:32 PM  Age at listing (hypothetical): 56 years  Sex: Female at 09/26/2022  2:32 PM    I personally spent 40 minutes face-to-face and non-face-to-face in the care  of this patient, which includes all pre, intra, and post visit time on the date of service.

## 2022-09-26 NOTE — Unmapped (Addendum)
[facility] L[facility] CENTER  [facility]  Suzie Portela, [place]   [facility]  [address]  [place], [place] [address]  Main Clinic: [phone]  Appointment Schedulers: [phone]  Silvestre Moment, RN: [phone]  Fax: [phone]       Thank you for allowing me to participate in your medical care today. Here are my recommendations based on today's visit:    Get an ultrasound of your liver every 6 months to look for early signs of liver cancer, due in May. Call [facility] [phone]  Referral sent to GI for nausea/vomiting and constipation. You can try Miralax 2-3 times per day for constipation.  Continue Xifaxan twice daily. Adjust lactulose dose or frequency to produce a target of 3-4 soft bowel movements a day.  Avoid NSAIDs (ibuprofen, naproxen, Advil, Motrin, Aleve, Naprosyn, etc). Acetaminophen (Tylenol) up to 2000 mg a day is ok.  Eat a high protein diet (1.2-1.5 grams per kg body weight per day, typically about 100 grams of protein a day). Eat small, frequent meals, high-protein late-night snacks, and use protein supplements such as Glucerna.  You got your second Havrix (Hepatitis A) and second Heplisav-B (Hepatitis B) vaccines today.    Take care,  Suzie Portela, PA

## 2022-10-03 LAB — VITAMIN D 25 HYDROXY: VITAMIN D, TOTAL (25OH): 52.8 ng/mL (ref 20.0–80.0)

## 2022-10-05 MED ORDER — SPIRONOLACTONE 100 MG TABLET
ORAL_TABLET | Freq: Every day | ORAL | 0 refills | 90 days | Status: CP
Start: 2022-10-05 — End: ?

## 2022-10-05 MED ORDER — FUROSEMIDE 40 MG TABLET
ORAL_TABLET | Freq: Every day | ORAL | 0 refills | 90 days | Status: CP
Start: 2022-10-05 — End: ?

## 2022-10-05 NOTE — Unmapped (Signed)
Patient is requesting the following refill  Requested Prescriptions     Pending Prescriptions Disp Refills    furosemide (LASIX) 40 MG tablet [Pharmacy Med Name: FUROSEMIDE 40 MG TABLET] 90 tablet 0     Sig: TAKE 1 TABLET BY MOUTH EVERY DAY    spironolactone (ALDACTONE) 100 MG tablet [Pharmacy Med Name: SPIRONOLACTONE 100 MG TABLET] 90 tablet 0     Sig: TAKE 1 TABLET BY MOUTH EVERY DAY       Recent Visits  Date Type Provider Dept   09/10/22 Office Visit Bowen, Heywood Footman, MD [facility]   08/14/22 Office Visit Bowen, Heywood Footman, MD [facility] Medical Group [facility]   07/09/22 Office Visit Bowen, Heywood Footman, MD [facility]   05/14/22 Office Visit Bowen, Heywood Footman, MD [facility]   05/08/22 Office Visit Bowen, Heywood Footman, MD [facility] [place]   Showing recent visits within past 365 days with a meds authorizing provider and meeting all other requirements  Future Appointments  Date Type Provider Dept   11/21/22 Appointment Bowen, Heywood Footman, MD [facility]   Showing future appointments within next 365 days with a meds authorizing provider and meeting all other requirements       Labs: Creatinine:   Creatinine (mg/dL)   Date Value   98/05/9146 0.88   06/26/2015 0.68    Potassium:   Potassium (mmol/L)   Date Value   09/26/2022 4.6   06/26/2015 4.5    Sodium:   Sodium (mmol/L)   Date Value   09/26/2022 138   06/26/2015 136

## 2022-10-08 NOTE — Unmapped (Signed)
EGD  Procedure #1     Procedure #2   [id number]  MRN     Endoscopist     Is the patient's health insurance [address], [facility]), or [employer]?     Urgent procedure     Are you pregnant?   TRUE  Are you in the process of scheduling or awaiting results of a heart ultrasound, stress test, or catheterization to evaluate new or worsening chest pain, dizziness, or shortness of breath?     Do you take: Plavix (clopidogrel), Coumadin (warfarin), Lovenox (enoxaparin), Pradaxa (dabigatran), Effient (prasugrel), Xarelto (rivaroxaban), Eliquis (apixaban), Pletal (cilostazol), or Brilinta (ticagrelor)?          Which of the above medications are you taking?          What is the name of the medical practice that manages this medication?          What is the name of the medical provider who manages this medication?     Do you have hemophilia, von Willebrand disease, or low platelets?     Do you have a pacemaker or implanted cardiac defibrillator?     Has a [place] [facility] provider specified the location(s)?     Which location(s) did the [facility] specify?        [facility]        [place]        [facility]-Propof[facility]        [facility]-Mod Sedation     Is procedure indication for variceal banding (this does NOT include variceal screening)?     Have you had a heart attack, stroke or heart stent placement within the past 6 months?     Month of event     Year of event (ONLY ENTER LAST 2 DIGITS)          Height (feet)     Height (inches)     Weight (pounds)     BMI          Did the ordering provider specify a bowel prep?          What bowel prep was specified?     Do you have chronic kidney disease?     Do you have chronic constipation or have you had poor quality bowel preps for past colonoscopies?     Do you have Crohn's disease or ulcerative colitis?     Have you had weight loss surgery?          When you walk around your house or grocery store, do you have to stop and rest due to shortness of breath, chest pain, or light-headedness?     Do you ever use supplemental oxygen?     Have you been hospitalized for cirrhosis of the liver or heart failure in the last 12 months?     Have you been treated for mouth or throat cancer with radiation or surgery?     Have you been told that it is difficult for doctors to insert a breathing tube in you during anesthesia?     Have you had a heart or lung transplant?          Are you on dialysis?     Do you have cirrhosis of the liver?     Do you have myasthenia gravis?     Is the patient a prisoner?          Have you been diagnosed with sleep apnea or do you wear a  CPAP machine at night?     Are you younger than 30?     Have you previously received propofol sedation administered by an anesthesiologist for a GI procedure?     Do you drink an average of more than 3 drinks of alcohol per day?     Do you regularly take suboxone or any prescription medications for chronic pain?     Do you regularly take Ativan, Klonopin, Xanax, Valium, lorazepam, clonazepam, alprazolam, or diazepam?     Have you previously had difficulty with sedation during a GI procedure?     Have you been diagnosed with PTSD?     Are you allergic to fentanyl or midazolam (Versed)?     Do you take medications for HIV?   ################# ## ###################################################################################################################   MRN:          [id number]   Anticoag Review:  No   Nurse Triage:  No   GI Clinic Consult:  No   Procedure(s):  EGD     0   Location(s):                    Endoscopist:  0   Urgent:            No   Prep:                            DO NOT SCHEDULE. Instruct patient to call back after they have completed cardiac testing and been cleared by their doctor.   ################# ## ###################################################################################################################

## 2022-10-09 ENCOUNTER — Ambulatory Visit: Admit: 2022-10-09 | Discharge: 2022-10-10 | Payer: PRIVATE HEALTH INSURANCE

## 2022-10-28 ENCOUNTER — Ambulatory Visit
Admission: RE | Admit: 2022-10-28 | Discharge: 2022-10-28 | Disposition: A | Discharge status: Home / Self Care | Payer: BC Managed Care – PPO | Source: Ambulatory Visit | Attending: Family Medicine | Admitting: Family Medicine

## 2022-10-28 DIAGNOSIS — Z1231 Encounter for screening mammogram for malignant neoplasm of breast: Secondary | ICD-10-CM | POA: Diagnosis present

## 2022-11-02 MED ORDER — METFORMIN ER 500 MG TABLET,EXTENDED RELEASE 24 HR
ORAL_TABLET | Freq: Every day | ORAL | 1 refills | 0 days
Start: 2022-11-02 — End: ?

## 2022-11-04 MED ORDER — METFORMIN ER 500 MG TABLET,EXTENDED RELEASE 24 HR
ORAL_TABLET | Freq: Every day | ORAL | 1 refills | 90 days | Status: CP
Start: 2022-11-04 — End: ?

## 2022-11-04 NOTE — Unmapped (Signed)
Patient is requesting the following refill  Requested Prescriptions     Pending Prescriptions Disp Refills    metFORMIN (GLUCOPHAGE-XR) 500 MG 24 hr tablet [Pharmacy Med Name: METFORMIN HCL ER 500 MG TABLET] 90 tablet 1     Sig: TAKE 1 TABLET BY MOUTH DAILY WITH EVENING MEAL.       Recent Visits  Date Type Provider Dept   09/10/22 Office Visit Bowen, Heywood Footman, MD [facility]   08/14/22 Office Visit Bowen, Heywood Footman, MD [facility]   07/09/22 Office Visit Bowen, Heywood Footman, MD [facility] Medical Group [facility]   05/14/22 Office Visit Bowen, Heywood Footman, MD [facility]   05/08/22 Office Visit Bowen, Heywood Footman, MD [facility]   Showing recent visits within past 365 days with a meds authorizing provider and meeting all other requirements  Future Appointments  Date Type Provider Dept   11/21/22 Appointment Bowen, Heywood Footman, MD [facility]   Showing future appointments within next 365 days with a meds authorizing provider and meeting all other requirements       Labs: A1c:   Hemoglobin A1c (%)   Date Value   06/15/2014 6.5 (H)     Hemoglobin A1C (%)   Date Value   09/26/2022 7.7 (H)   05/08/2022 5.6    Creatinine:   Creatinine (mg/dL)   Date Value   30/86/5784 0.88   06/26/2015 0.68

## 2022-11-06 NOTE — Unmapped (Signed)
Called and unable to leave message. Patient is already scheduled for 5/16.

## 2022-11-06 NOTE — Unmapped (Unsigned)
Front desk, please let patient know that she needs to be seen in order for the form to be completed. We have several opening in the next couple of weeks. Thanks!

## 2022-11-06 NOTE — Unmapped (Unsigned)
NTBS.

## 2022-11-07 MED ORDER — DULOXETINE 20 MG CAPSULE,DELAYED RELEASE
ORAL_CAPSULE | Freq: Every day | ORAL | 1 refills | 90 days
Start: 2022-11-07 — End: 2023-11-07

## 2022-11-07 MED ORDER — FUROSEMIDE 40 MG TABLET
ORAL_TABLET | Freq: Every day | ORAL | 0 refills | 90 days
Start: 2022-11-07 — End: ?

## 2022-11-07 MED ORDER — TRAZODONE 50 MG TABLET
ORAL_TABLET | Freq: Every evening | ORAL | 3 refills | 90 days
Start: 2022-11-07 — End: ?

## 2022-11-07 MED ORDER — SPIRONOLACTONE 100 MG TABLET
ORAL_TABLET | Freq: Every day | ORAL | 0 refills | 90 days
Start: 2022-11-07 — End: ?

## 2022-11-07 MED ORDER — ROPINIROLE 0.25 MG TABLET
ORAL_TABLET | Freq: Two times a day (BID) | ORAL | 1 refills | 90 days
Start: 2022-11-07 — End: 2023-11-07

## 2022-11-07 NOTE — Unmapped (Incomplete)
SPECIFIC INSTRUCTIONS WE DISCUSSED TODAY:  In order to schedule your colonoscopy/endoscopy procedure, please contact [facility] Central Scheduling at [phone]. You may press option 1, then option 2 to schedule your procedure. You may also choose a Gastroenterologist provider by name if you have someone you prefer. If you have any further questions or concerns, feel free to reach out to [place] at [facility].    Will send a message to hepatology regarding hepatic encephalopathy treatment and how lactulose and rifamixin not helping.    Labs as ordered. Plan to be based on results.     Thank you for choosing [facility] for your care!    If you have a question about a recent visit or a plan that was discussed with your provider in the past: Please go to [email or web address] (or your phone app) and sign in to your [facility] Chart to send this information. I do my best to respond to messages within 3 business days, but it is not always possible to provide the most timely and complete information. For more urgent questions, CALLING THE OFFICE is always the best option.    If you are experiencing any new or worsening symptoms or you would like to talk about changing a medication or medical plan; it is best to schedule an appointment: Please call our clinic at [phone] to schedule an appointment with your provider or the Acute Care Clinic or you can access the [facility] at [phone]. If you cannot decide if your issue warrants a visit, you can always ask to discuss this with one of our clinical care team.    If you have urgent healthcare needs after normal business hours, on weekends, or during holidays:  We have an Acute Care Clinic available by appointment. Call [phone] to schedule this. We offer care for health issues that do not require the emergency room.   Monday - Thursday (8am to 7pm)  Friday (8am to 5pm)   Saturdays (9am to 1pm)   You can always reach the [facility] 24/7 Nursing Line after hours to get nurse advice. The nurse can consult with the doctor on call if indicated. Just call our main office number and follow the prompts - [phone]     You may receive a patient satisfaction survey by mail, text or email regarding your visit today. Your opinion is important to me. Your response benefits [place] all :-)    If you had or will have testing done, test results will be posted on [facility] or sent via letter.  A member of our Care Team will also contact you by message or phone if your results require follow-up.Please note that specialty test results take longer. Lab results may post before I review them. We will follow up as needed when I review them.    Lorel Monaco. Cortny Bambach, MD -   [facility]  [address], [place] [address]  Phone: [phone] Fax: [phone]  [email or web address]

## 2022-11-08 ENCOUNTER — Ambulatory Visit: Admit: 2022-11-08 | Discharge: 2022-11-08 | Payer: PRIVATE HEALTH INSURANCE

## 2022-11-08 NOTE — Unmapped (Signed)
Denise York has been contacted in regards to their refill of XIFAXAN 550 mg Tab (rifAXIMin). At this time, they have declined refill due to  not needing medication at this time . Refill assessment call date has been updated per the patient's request.

## 2022-11-08 NOTE — Unmapped (Signed)
DIVISION OF CARDIOLOGY  [employer] [place]        Date of Service: 11/08/2022    PCP: Referring Provider:   Cathey Endow Denise Footman, MD  [phone] [address] [facility] [place] [address]  Phone: [phone]  Fax: [phone] Denise York, Denise Footman, MD  [address]  [facility] [place],  [place] [address]  Phone: [phone]  Fax: [phone]     ______________________________________________________________________________________________    ASSESSMENT AND PLAN:   Atrial Septal Defect  - successful closure of her ASD with a 32 mm Gore cardioform device.    - her RA/RV size has improved/decreased compared to pre-ASD closure.  - she has some GERD symptoms (presumably), therefore we will stop her Plavix.      2. CAD  07/09/22 CTA cardiac shows non flow limiting mild CAD with focal calcified plaque in the ostial RCA causing mild (25-49%) stenosis and mixed focal plaque in the mid-distal LAD causing mild (25-49%) stenosis.   - no need for ischemic evaluation / discussed with Denise York.    3. Cirrhosis  Follows with Dr. Eudelia York hepatology for cirrhosis complicated by portal hypertension and hepatic encephalopathy. Has had more leg swelling, now on lasix. Suspect some of the hepatic congestion due to elevated right sided pressures/congestive hepatology due to ASD physiology. Hopeful some of this will improve with ASD closure, although there is likely fatty liver component as well   - Lasix, spiro, coreg, lactulose per hepatology     Denise York will follow longitudinally with Dr. Nedra York.      Denise Balint, MD,  [facility], [facility]  Interventional Cardiology  Associate Professor of Medicine  [place] [facility] [place] at [facility]  ______________________________________________________________________________________________      SUBJECTIVE:     HISTORY OF PRESENT ILLNESS:    Dear Dr. Cathey Endow, Denise Footman, MD  [address]  [facility] [place],  [place] [address],    I had the pleasure of seeing Denise York in our adult general cardiology clinic today for follow up visit regarding an atrial septal defect needing closure referred by Dr. Cathey Endow, Denise Footman, MD.  She is a 57 y.o. year-old patient with a PMH of diabetes, family history of CAD and obesity.      For the past few years she's had increasing fatigue, DOE, orthopnea, intermittent exertional chest pain. This has worsened in the past 6-8 months, and now she cannot do simple things she did before including going to the grocery store. When cleaning her house, has to rest between every room due to shortness of breath. She has chest tightness when exerting herself which resolves with rest.  Also feels extra beats with exertion. She's had 2 sleep studies which she reports did not show OSA. 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 Also taking lactulose, though only has BM once week. Feels her thinking is cloudy and friend who accompanies her today feels she gets confused.     PCP workup included echo which showed ASD, RV mod dilated in size, RA mod dilated in size. 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 Adequate rims noted for percutaneous closure.     Since she began diuretics 1-2 weeks ago she  has experienced significant improvement in lower extremity swelling. She is taking 40mg  lasix and 100mg  spironalactone daily. Her shoe was an 8, increased to size 11, since diuretics now back to an 8. She's lost 10 lbs in that time. She does not feel the diuretics helped with her SOB. She is experiencing some leg cramping.    She had a cardiac cath on 07/15/22 which found 10-12 mm ASD secundum defect noted and was closed with a 32 mm cardio form device implantation. Since then she feels better.  She has been chronically sedentary since before COVID (4-5 years ago).  She has additionally some atypical chest discomfort, and I have discussed with Denise York and her CT does not show significant CAD.  We will manage her expectantly with close monitoring.      Since her ASD closure, she has had more vomiting, and also GERD.  We are on our 3 month mark - and I think we can stop her Plavix and this might help her GI upset.     She reports no lightheadedness, dizziness, palpitations, or syncope.  Denise York states compliance with her medications and denies any untoward side effects from it.      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 Walking across campus is difficult due to DOE. She is currently on FMLA.    CARDIOVASCULAR HISTORY AND PROCEDURES    Cardiovascular Studies Date Comments     ECG 12/23 NSR   Echo 2024 Ostium secundum ASD with moderate left to right shunting. Measured 1 cm x 0.4 cm.    Stress test     Cardiac catheterization     Electrophysiology      Cardiovascular Surgery     Peripheral Vascular Studies         I have reviewed the cardiology tests personally.      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      blood sugar diagnostic (GLUCOSE BLOOD) Strp Use to test blood sugar daily 100 strip 3    blood-glucose meter kit Use to test blood sugar DAILY 1 each 1    carvediloL (COREG) 6.25 MG tablet Take 1 tablet (6.25 mg total) by mouth two (2) times a day. 180 tablet 3    cholecalciferol, vitamin D3, 2,000 unit cap Take 2 capsules (4,000 Units total) by mouth daily. (Patient taking differently: Take 1 capsule (50 mcg total) by mouth daily.) 1 each 0    clopidogrel (PLAVIX) 75 mg tablet Take 1 tablet (75 mg total) by mouth daily. 30 tablet 2    DULoxetine (CYMBALTA) 20 MG capsule Take 1 capsule (20 mg total) by mouth daily. 90 capsule 1    furosemide (LASIX) 40 MG tablet TAKE 1 TABLET BY MOUTH EVERY DAY 90 tablet 0    inhalational spacing device (AEROCHAMBER MV) Spcr  Use as directed with inhalers 1 each 0    lancets Misc Use to test blood sugar daily 100 each 3    loratadine (CLARITIN) 10 mg tablet TAKE 1 TABLET BY MOUTH EVERY DAY 90 tablet 1    magnesium oxide (MAG-OX) 400 mg (241.3 mg elemental magnesium) tablet Take 1 tablet (400 mg total) by mouth daily.      metFORMIN (GLUCOPHAGE-XR) 500 MG 24 hr tablet TAKE 1 TABLET BY MOUTH DAILY WITH EVENING MEAL. 90 tablet 1    multivitamin (TAB-A-VITE/THERAGRAN) per tablet Take 1 tablet by mouth daily.      psyllium seed, with sugar, (FIBER ORAL) Take 4 capsules by mouth daily. BJs brand.      rifAXIMin (XIFAXAN) 550 mg Tab Take 1 tablet (550 mg total) by mouth two (2) times a day. 180 tablet 3    rOPINIRole (REQUIP) 0.25 MG tablet Take 1 tablet (0.25 mg total) by mouth two (2) times a day. 180 tablet 1    semaglutide (OZEMPIC) 2 mg/dose (8 mg/3 mL) PnIj Inject 2 mg under the skin every seven (7) days. 9 mL 3    spironolactone (ALDACTONE) 100 MG tablet TAKE 1 TABLET BY MOUTH EVERY DAY 90 tablet 0    traZODone (DESYREL) 50 MG tablet TAKE 1 TABLET BY MOUTH EVERY DAY AT NIGHT 90 tablet 3     No current facility-administered medications for this visit.       FAMILY HISTORY  Negative for early CAD    SOCIAL HISTORY  She  reports that she has never smoked. She has never used smokeless tobacco. She reports that she does not drink alcohol and does not use drugs.      REVIEW OF SYSTEMS    Review of Systems - 10 systems were reviewed and negative except as noted in HPI    Constitutional: negative for - chills, fatigue, fever or night sweats  ENT ROS: negative for - vertigo or visual changes  Hematological and Lymphatic ROS: negative for - blood transfusions, jaundice, night sweats or swollen lymph nodes  Endocrine ROS: negative for - skin changes or temperature intolerance  Respiratory ROS: no cough, shortness of breath, or wheezing negative for - hemoptysis, orthopnea, shortness of breath, tachypnea or wheezing  Cardiovascular ROS:  As reported in HPI  Gastrointestinal ROS: negative for - abdominal pain, hematemesis, melena, nausea/vomiting or swallowing difficulty/pain  Genito-Urinary ROS: negative for - dysuria, incontinence or nocturia  Musculoskeletal ROS: negative for - gait disturbance, joint pain, muscle pain or muscular weakness  Neurological ROS: negative for - behavioral changes, bowel and bladder control changes, headaches, seizures or speech problems    PHYSICAL EXAM     Physical Exam  There were no vitals taken for this visit.   Wt Readings from Last 3 Encounters:   09/26/22 100.4 kg (221 lb 6.4 oz)   09/12/22 (!) 101.2 kg (223 lb)   09/10/22 (!) 101.2 kg (223 lb)       General:  Alert, no distress.   Eyes:  Intact, sclerae anicteric.   Ears, nose, mouth: Moist mucous membranes.Supple, no carotid bruit.    Respiratory:   CTAB bilaterally with normal WOB.   Cardiovascular:  No carotid bruit, no JVD, RRR no murmurs rubs or gallops   Gastrointestinal:   Normal bowel sounds, soft, NTND.   Musculoskeletal: Normal strength   Skin: Warm, well perfused.   Neurologic: No focal deficits.       Most recent labs   Lab Results  Component Value Date    Sodium 138 09/26/2022    Sodium 136 06/26/2015    Potassium 4.6 09/26/2022    Potassium 4.5 06/26/2015    Chloride 105 09/26/2022    Chloride 99 06/26/2015    CO2 26.0 09/26/2022    CO2 24 06/26/2015    BUN 11 09/26/2022    BUN 13 06/26/2015    Creatinine 0.88 09/26/2022    Creatinine 0.68 06/26/2015 Magnesium 2.0 09/13/2022     Lab Results   Component Value Date    HGB 14.1 09/26/2022    Hemoglobin, POC 12.7 09/12/2022    MCV 88.6 09/26/2022    MCV 93 06/15/2014    Platelet 82 (L) 09/26/2022    Platelet 198 06/15/2014     Lab Results   Component Value Date    Cholesterol 125 05/09/2022    Cholesterol, Total 126 06/26/2015    Triglycerides 56 05/09/2022    Triglycerides 85 06/26/2015    HDL 49 05/09/2022    HDL 45 06/26/2015    Non-HDL Cholesterol 76 05/09/2022    LDL Calculated 65 05/09/2022    LDL Calculated 64 06/26/2015    Hemoglobin A1c 6.5 (H) 06/15/2014    Hemoglobin A1C 7.7 (H) 09/26/2022    Hemoglobin A1C 5.6 05/08/2022    TSH 1.081 11/09/2020    TSH 2.360 06/26/2015    PRO-BNP 41.8 03/11/2019    INR 1.30 09/26/2022       Lab Results   Component Value Date    CHOL 125 05/09/2022    CHOL 123 11/09/2020    CHOL 137 08/21/2016     Lab Results   Component Value Date    HDL 49 05/09/2022    HDL 44 11/09/2020    HDL 45 08/21/2016     Lab Results   Component Value Date    LDL 65 05/09/2022    LDL 67 11/09/2020    LDL 75 08/21/2016     Lab Results   Component Value Date    VLDL 11.2 05/09/2022    VLDL 12 11/09/2020    VLDL 29.5 08/21/2016     Lab Results   Component Value Date    CHOLHDLRATIO 2.6 05/09/2022    CHOLHDLRATIO 2.8 11/09/2020    CHOLHDLRATIO 3.0 08/21/2016     Lab Results   Component Value Date    TRIG 56 05/09/2022    TRIG 60 11/09/2020    TRIG 87 08/21/2016         *Patient note was created using dragon dictation. Any errors in syntax or proofreading may not have been identified and edited on initial review prior to signing this note.    Thank you very much for allowing me the opportunity to participate in the care of Denise York, who is a delightful patient.  Please do not hesitate to call me if you have any questions.      Scribe Attestation:         This document serves as a record of the services and decisions performed by Denise Balint, MD, [facility], FSCAI on 11/08/2022. It was created on his behalf by Belva Agee, a trained medical scribe. The creation of this document is based on the provider's statements and observations that were conveyed to the medical scribe during the patient's encounter.     (The information in this document, created by the medical scribe for me, accurately reflects the services I personally performed and the decisions made by me. I have reviewed and  approved this document for accuracy.)     Denise Balint, MD,  [facility], [facility]  Interventional Cardiology  Associate Professor of Medicine  [place] at [facility]

## 2022-11-08 NOTE — Unmapped (Addendum)
Your echocardiogram shows improvement. I will discuss with Dr. Nedra Hai regarding the status of your blockages.  Stop taking Plavix to help with reflux.   Establish a consistent exercise regimen.  Follow up with hepatology regarding liver function.

## 2022-11-09 MED ORDER — FUROSEMIDE 40 MG TABLET
ORAL_TABLET | Freq: Every day | ORAL | 0 refills | 0 days
Start: 2022-11-09 — End: ?

## 2022-11-09 MED ORDER — OZEMPIC 1 MG/DOSE (4 MG/3 ML) SUBCUTANEOUS PEN INJECTOR
SUBCUTANEOUS | 35 refills | 0 days
Start: 2022-11-09 — End: ?

## 2022-11-09 MED ORDER — ATORVASTATIN 10 MG TABLET
ORAL_TABLET | Freq: Every day | ORAL | 3 refills | 0 days
Start: 2022-11-09 — End: ?

## 2022-11-09 MED ORDER — METFORMIN ER 500 MG TABLET,EXTENDED RELEASE 24 HR
ORAL_TABLET | 1 refills | 0 days
Start: 2022-11-09 — End: ?

## 2022-11-09 MED ORDER — TRAZODONE 50 MG TABLET
ORAL_TABLET | 3 refills | 0 days
Start: 2022-11-09 — End: ?

## 2022-11-09 MED ORDER — ROPINIROLE 0.25 MG TABLET
ORAL_TABLET | Freq: Two times a day (BID) | ORAL | 1 refills | 0 days
Start: 2022-11-09 — End: ?

## 2022-11-09 MED ORDER — SPIRONOLACTONE 100 MG TABLET
ORAL_TABLET | Freq: Every day | ORAL | 0 refills | 0 days
Start: 2022-11-09 — End: ?

## 2022-11-11 MED ORDER — OZEMPIC 1 MG/DOSE (4 MG/3 ML) SUBCUTANEOUS PEN INJECTOR
SUBCUTANEOUS | 35 refills | 0 days
Start: 2022-11-11 — End: ?

## 2022-11-11 MED ORDER — FUROSEMIDE 40 MG TABLET
ORAL_TABLET | Freq: Every day | ORAL | 0 refills | 90 days
Start: 2022-11-11 — End: ?

## 2022-11-11 MED ORDER — ROPINIROLE 0.25 MG TABLET
ORAL_TABLET | Freq: Two times a day (BID) | ORAL | 1 refills | 90 days
Start: 2022-11-11 — End: 2023-11-11

## 2022-11-11 MED ORDER — ATORVASTATIN 10 MG TABLET
ORAL_TABLET | Freq: Every day | ORAL | 3 refills | 90 days
Start: 2022-11-11 — End: 2023-11-11

## 2022-11-11 MED ORDER — TRAZODONE 50 MG TABLET
ORAL_TABLET | 3 refills | 0 days
Start: 2022-11-11 — End: ?

## 2022-11-11 MED ORDER — METFORMIN ER 500 MG TABLET,EXTENDED RELEASE 24 HR
ORAL_TABLET | 1 refills | 0 days
Start: 2022-11-11 — End: ?

## 2022-11-11 MED ORDER — SPIRONOLACTONE 100 MG TABLET
ORAL_TABLET | Freq: Every day | ORAL | 0 refills | 90 days
Start: 2022-11-11 — End: ?

## 2022-11-11 NOTE — Unmapped (Signed)
Patient is requesting the following refill  Requested Prescriptions     Pending Prescriptions Disp Refills    traZODone (DESYREL) 50 MG tablet [Pharmacy Med Name: TRAZODONE 50 MG TABLET] 90 tablet 3     Sig: TAKE 1 TABLET BY MOUTH EVERY DAY AT NIGHT    rOPINIRole (REQUIP) 0.25 MG tablet [Pharmacy Med Name: ROPINIROLE HCL 0.25 MG TABLET] 180 tablet 1     Sig: TAKE 1 TABLET (0.25 MG TOTAL) BY MOUTH TWO (2) TIMES A DAY.     Refused Prescriptions Disp Refills    OZEMPIC 1 mg/dose (4 mg/3 mL) PnIj injection [Pharmacy Med Name: OZEMPIC 4 MG/3 ML (1 MG/DOSE)]  35     Sig: INJECT 1 MG UNDER THE SKIN EVERY SEVEN (7) DAYS.    spironolactone (ALDACTONE) 100 MG tablet [Pharmacy Med Name: SPIRONOLACTONE 100 MG TABLET] 90 tablet 0     Sig: TAKE 1 TABLET BY MOUTH EVERY DAY    atorvastatin (LIPITOR) 10 MG tablet [Pharmacy Med Name: ATORVASTATIN 10 MG TABLET] 90 tablet 3     Sig: Take 1 tablet (10 mg total) by mouth daily with evening meal.    furosemide (LASIX) 40 MG tablet [Pharmacy Med Name: FUROSEMIDE 40 MG TABLET] 90 tablet 0     Sig: TAKE 1 TABLET BY MOUTH EVERY DAY    metFORMIN (GLUCOPHAGE-XR) 500 MG 24 hr tablet [Pharmacy Med Name: METFORMIN HCL ER 500 MG TABLET] 180 tablet 1     Sig: TAKE 2 TABLETS (1,000 MG TOTAL) BY MOUTH DAILY WITH EVENING MEAL       Recent Visits  Date Type Provider Dept   09/10/22 Office Visit Bowen, [place] Footman, MD [facility]   08/14/22 Office Visit Bowen, [place] Footman, MD [facility]   07/09/22 Office Visit Bowen, [place] Footman, MD [facility]   05/14/22 Office Visit Bowen, [place] Footman, MD [facility]   05/08/22 Office Visit Bowen, [place] Footman, MD [facility] [place]   Showing recent visits within past 365 days with a meds authorizing provider and meeting all other requirements  Future Appointments  Date Type Provider Dept   11/21/22 Appointment Bowen, [place] Footman, MD [facility]   Showing future appointments within next 365 days with a meds authorizing provider and meeting all other requirements       Labs: Not applicable this refill

## 2022-11-12 MED ORDER — TRAZODONE 50 MG TABLET
ORAL_TABLET | 0 refills | 0 days | Status: CP
Start: 2022-11-12 — End: ?

## 2022-11-12 MED ORDER — ROPINIROLE 0.25 MG TABLET
ORAL_TABLET | Freq: Two times a day (BID) | ORAL | 1 refills | 90 days | Status: CP
Start: 2022-11-12 — End: 2023-11-12

## 2022-11-13 ENCOUNTER — Ambulatory Visit: Admit: 2022-11-13 | Discharge: 2022-11-13 | Payer: PRIVATE HEALTH INSURANCE

## 2022-11-18 DIAGNOSIS — K7460 Unspecified cirrhosis of liver: Principal | ICD-10-CM

## 2022-11-18 DIAGNOSIS — K7581 Nonalcoholic steatohepatitis (NASH): Principal | ICD-10-CM

## 2022-11-18 NOTE — Unmapped (Signed)
Recommend MRI as there were severe limitations (grade C) per [place] for HCC screening.

## 2022-11-21 ENCOUNTER — Ambulatory Visit: Admit: 2022-11-21 | Discharge: 2022-11-22 | Payer: PRIVATE HEALTH INSURANCE

## 2022-11-21 MED ORDER — DULOXETINE 20 MG CAPSULE,DELAYED RELEASE
ORAL_CAPSULE | Freq: Every day | ORAL | 1 refills | 90 days
Start: 2022-11-21 — End: 2023-11-21

## 2022-11-21 MED ORDER — SPIRONOLACTONE 100 MG TABLET
ORAL_TABLET | Freq: Every day | ORAL | 0 refills | 90 days
Start: 2022-11-21 — End: ?

## 2022-11-21 MED ORDER — FUROSEMIDE 40 MG TABLET
ORAL_TABLET | Freq: Every day | ORAL | 0 refills | 90 days
Start: 2022-11-21 — End: ?

## 2022-11-21 MED ORDER — TRAZODONE 50 MG TABLET
ORAL_TABLET | Freq: Every evening | ORAL | 3 refills | 90 days
Start: 2022-11-21 — End: ?

## 2022-11-21 MED ORDER — ROPINIROLE 0.25 MG TABLET
ORAL_TABLET | Freq: Two times a day (BID) | ORAL | 1 refills | 90 days
Start: 2022-11-21 — End: 2023-11-21

## 2022-11-21 NOTE — Unmapped (Signed)
Chief Complaint:  Chief Complaint   Patient presents with    Hypertension    Diabetes     Discuss A1c, would like to recheck since she has been on ozempic again, too early    hepatic encephalopathy    Constipation    vision changes       This patients last WCC/CPE date: : Not Found  This patient's last AWV date: [facility] Last Medicare Wellness Visit Date: Not Found    History of Present Illness:  Denise York is a 57 y.o. female with past medical history as below presents today for evaluation/follow up of DM. Patient over the winter had a spike in her blood sugars and she was trialed on victoza.However, this really did not work for patient and she was switched to ozempic which has hleped with her sugars according to the labs drawn recently. Reports ongoing problems with confusion. Her family members have commented on this as well. Patient is asking if she should have her ammonia level checked. She is taking lactulose - a spoonful a few times daily and taking rifamixin but still having infrequent hard pellet stools. Goal for patient is to take enough lactulose and rifamixin to cause loose stools and decrease ammonia in body and thus hepatic encephalopathy. Patient continues to feel very fatigued and has joint pains all over her body. 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    Obesity     Prediabetes      Patient Active Problem List   Diagnosis    Insomnia    Vitamin deficiency    Dysthymic disorder    Restless leg syndrome    Urinary frequency    Incomplete bladder emptying    Nonspecific abnormal results of liver function study    Edema    Vitamin D deficiency    Itch    Fatty liver disease, nonalcoholic    Constipation    Depression    Neoplasm of right breast, 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    Type 2 diabetes mellitus without complication (CMS-HCC)    Bowel incontinence    Mixed incontinence urge and stress    Encounter for long-term current use of medication    Obesity    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    Back pain    Neoplasm of uncertain behavior    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)    Portal hypertension (CMS-HCC)    Hepatic encephalopathy (CMS-HCC)    Thrombocytopenia (CMS-HCC)    Melena    Lower extremity edema    ASD (atrial septal defect)    Nausea and vomiting     OB History       Gravida   2    Para   2    Term   2    Preterm  AB        Living   0         SAB        IAB        Ectopic        Molar        Multiple        Live Births   2              Past Surgical History:   Procedure Laterality Date    ABDOMINAL WALL MESH  REMOVAL  1997, 2005    Placement, 1997, removal and replacement 2005    BLADDER SURGERY      bladder tact X2    BREAST BIOPSY Right 04/2013    benign    BREAST EXCISIONAL BIOPSY Right 05/2013    benign    BREAST SURGERY Right     precancerous spot removed    CHOLECYSTECTOMY      HYSTERECTOMY      PR COLSC FLX W/RMVL OF TUMOR POLYP LESION SNARE TQ N/A 10/14/2016    Procedure: COLONOSCOPY FLEX; W/REMOV TUMOR/LES BY SNARE;  Surgeon: Alfred Levins, MD;  Location: [facility] [place];  Service: Gastroenterology    PR PERC CLOS,CONG INTERATRIAL COMMUN W/IMPL N/A 09/12/2022    Procedure: ASD closure;  Surgeon: Jacquelyne Balint, MD;  Location: [facility] CATH;  Service: Cardiology    SKIN BIOPSY         Allergies:  Penicillins, Shellfish containing products, Glipizide, and Lisinopril    Current Outpatient Medications   Medication Sig Dispense Refill    aspirin (ECOTRIN) 81 MG tablet Take 1 tablet (81 mg total) by mouth daily.      blood sugar diagnostic (GLUCOSE BLOOD) Strp Use to test blood sugar daily 100 strip 3    blood-glucose meter kit Use to test blood sugar DAILY 1 each 1    carvediloL (COREG) 6.25 MG tablet Take 1 tablet (6.25 mg total) by mouth two (2) times a day. 180 tablet 3    cholecalciferol, vitamin D3, 2,000 unit cap Take 2 capsules (4,000 Units total) by mouth daily. (Patient taking differently: Take 1 capsule (50 mcg total) by mouth daily.) 1 each 0    inhalational spacing device (AEROCHAMBER MV) Spcr Use as directed with inhalers 1 each 0    lancets Misc Use to test blood sugar daily 100 each 3    loratadine (CLARITIN) 10 mg tablet TAKE 1 TABLET BY MOUTH EVERY DAY 90 tablet 1    magnesium oxide (MAG-OX) 400 mg (241.3 mg elemental magnesium) tablet Take 1 tablet (400 mg total) by mouth daily.      metFORMIN (GLUCOPHAGE-XR) 500 MG 24 hr tablet TAKE 1 TABLET BY MOUTH DAILY WITH EVENING MEAL. 90 tablet 1    multivitamin (TAB-A-VITE/THERAGRAN) per tablet Take 1 tablet by mouth daily.      psyllium seed, with sugar, (FIBER ORAL) Take 4 capsules by mouth daily. BJs brand.      rifAXIMin (XIFAXAN) 550 mg Tab Take 1 tablet (550 mg total) by mouth two (2) times a day. 180 tablet 3    semaglutide (OZEMPIC) 2 mg/dose (8 mg/3 mL) PnIj Inject 2 mg under the skin every seven (7) days. 9 mL 3    DULoxetine (CYMBALTA) 20 MG capsule Take 1 capsule (20 mg total) by mouth daily. 90 capsule 1    furosemide (LASIX) 40 MG tablet Take 1 tablet (40 mg total) by mouth daily. 90 tablet 1  rOPINIRole (REQUIP) 0.25 MG tablet Take 1 tablet (0.25 mg total) by mouth two (2) times a day. 180 tablet 1    spironolactone (ALDACTONE) 100 MG tablet Take 1 tablet (100 mg total) by mouth daily. 90 tablet 1    traZODone (DESYREL) 50 MG tablet Take 1 tablet (50 mg total) by mouth nightly. 90 tablet 3     No current facility-administered medications for this visit.     Social History     Socioeconomic History    Marital status: Married     Spouse name: None    Number of children: None    Years of education: None    Highest education level: None   Tobacco Use    Smoking status: Never    Smokeless tobacco: Never   Substance and Sexual Activity    Alcohol use: No    Drug use: No    Sexual activity: Not Currently   Other Topics Concern    Do you use sunscreen? Yes    Excessive sun exposure? Yes   Social History Narrative    2 births. No living children. Married new husband 2014.         02/21/2017        PCMH Components:        Family, social, cultural characteristics: social support includes best friend .      Patient has the following communication needs: none, per patient     Health Literacy: How confident are you that you understand your health issues/concerns, can participate in your care, and manage your care along with your physician: confident.    Behaviors Affecting Health: none, per patient    Family history of mental health illness and/or substance abuse: asked patient/parent and none disclosed.    Have you been seen by any medical provider that we have not referred you to since your last visit ? No    Discussed a Living Will with the patient and QVZ:DGLOVFI is under the age of 83.      Social Determinants of Health     Food Insecurity: Patient Declined (11/21/2022)    Hunger Vital Sign     Worried About Running Out of Food in the Last Year: Patient declined     Ran Out of Food in the Last Year: Patient declined     Family History   Problem Relation Age of Onset    Heart disease Mother     Hypertension Mother     Heart disease Father     Kidney disease Father     Allergies Father     Gout Father     Allergies Other         FAMILY H/O    Coronary artery disease Other         FAMILY H/O    Kidney failure Other         FAMILY H/O    No Known Problems Sister     No Known Problems Daughter     No Known Problems Maternal Grandmother     No Known Problems Maternal Grandfather     No Known Problems Paternal Grandmother     No Known Problems Paternal Grandfather     BRCA 1/2 Neg Hx     Breast cancer Neg Hx     Cancer Neg Hx     Colon cancer Neg Hx     Endometrial cancer Neg Hx     Ovarian cancer Neg Hx     Substance  Abuse Disorder Neg Hx     Mental illness Neg Hx      Immunization History   Administered Date(s) Administered    COVID-19 VAC,MRNA,TRIS(12Y UP)(PFIZER)(GRAY CAP) 08/19/2020    COVID-19 VACC,MRNA,(PFIZER)(PF) 10/02/2019, 10/23/2019    Covid-19 Vac, (22yr+) (Comirnaty) [employer]  04/08/2022    HEPATITIS B VACCINE ADULT, ADJUVANTED, IM(HEPLISAV B) 06/17/2022, 09/26/2022    Hepatitis A (Adult) 06/17/2022, 09/26/2022    INFLUENZA INJ MDCK PF, QUAD,(FLUCELVAX)(92MO AND UP EGG FREE) 05/15/2021    INFLUENZA TIV (TRI) PF (IM) 04/09/2011    Influenza TRI (IIV3) 5+yrs MDV 06/04/2013    Influenza Vaccine Quad(IM)6 MO-Adult(PF) 05/08/2017, 04/09/2018, 03/11/2019    Influenza Virus Vaccine, unspecified formulation 04/08/2015, 04/07/2016, 05/04/2020, 04/08/2022    PNEUMOCOCCAL POLYSACCHARIDE 23-VALENT 07/19/2014    Pneumococcal Conjugate 20-valent 06/17/2022    SHINGRIX-ZOSTER VACCINE (HZV),RECOMBINANT,ADJUVANTED(IM) 11/09/2020, 05/15/2021    TdaP 07/16/2011, 06/17/2022       Health Maintenance   Topic Date Due    Colon Cancer Screening  10/14/2021    Retinal Eye Exam  11/21/2022    Hemoglobin A1c  12/27/2022    Foot Exam  05/09/2023    Urine Albumin/Creatinine Ratio  05/09/2023    Serum Creatinine Monitoring  09/26/2023    Potassium Monitoring  09/26/2023    Mammogram Start Age 76  10/28/2023    Lipid Screening  05/10/2027    DTaP/Tdap/Td Vaccines (3 - Td or Tdap) 06/17/2032    Pneumococcal Vaccine 0-64  Completed    Hepatitis C Screen  Completed    COVID-19 Vaccine  Completed    Influenza Vaccine  Completed    Zoster Vaccines  Completed       I have reviewed and (if needed) updated the patient's problem list, medications, allergies, past medical and surgical history, social and family history     Review of Systems:  As above.     Physical Exam:  Vital Signs:  Vitals:    11/21/22 1400   BP: 110/74   Pulse: 73   Resp: 14   Temp: 36.4 ??C (97.6 ??F)   SpO2: 99%     Height: 165.1 cm (5' 5) (carried forward)    Body mass index is 34.68 kg/m??.  Wt Readings from Last 3 Encounters:   11/21/22 94.5 kg (208 lb 6.4 oz)   11/08/22 96.3 kg (212 lb 3.2 oz)   09/26/22 100.4 kg (221 lb 6.4 oz)     No LMP recorded. Patient has had a hysterectomy.    General: fatigued appearing, no acute distress. See BMI.  Head: Normocephalic, Atraumatic  Eyes: Sclera and conjunctiva clear, no drainage.PERRL, EOMI bilaterally.  Ears: External ears normal, Canals clear,  TMs with normal light reflex  Mouth mucus membranes moist, posterior oropharynx without erythema or exudates. .  Neck: Supple, normal ROM, no thyromegaly.  Lymph Nodes: No cervical or supraclavicular lymphadenopathy.  Skin: No rashes or obvious concerning lesions on exposed skin.  Cardiovascular: RRR, normal S1/S2, no murmurs, rubs or gallops. No chest wall TTP.  Lungs: CTA bilaterally, without crackles/wheezes/rhonchi, good air movement, no increased WOB  Abdomen:  Soft, non-distended, non-tender, no HSM or masses. Normal bowel sounds  Extremities: trace edema, pulses 2 +  PT   Musculoskeletal: Normal tone UEs/LEs  Neurologic: CNs 2-12 grossly intact, balance normal. Gross motor/fine motor normal.  Psychiatry: Pleasant affect, well kept, no abnormalities.    Imaging Studies:  No imaging ordered this visit.    Labs:  No results found for this or any previous visit (from  the past 672 hour(s)).    Assessment & Plan: Pathophysiology of condition(s) reviewed with patient if applicable. Pt stated understanding and there were no barriers to learning.  SEE PATIENT INSTRUCTIONS BELOW FOR DISCUSSION/PLAN     Diagnosis ICD-10-CM Associated Orders   1. Hepatic encephalopathy (CMS-HCC)  K76.82 2. 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 Type 2 diabetes mellitus without complication, without long-term current use of insulin (CMS-HCC)  E11.9       4. Encounter for long-term current use of medication  Z79.899       5. Thrombocytopenia (CMS-HCC)  D69.6       6. Portal hypertension (CMS-HCC)  K76.6 furosemide (LASIX) 40 MG tablet     spironolactone (ALDACTONE) 100 MG tablet      7. Constipation, unspecified constipation type  K59.00             Patient Instructions   SPECIFIC INSTRUCTIONS WE DISCUSSED TODAY:  In order to schedule your colonoscopy/endoscopy procedure, please contact [facility] Central Scheduling at [phone]. You may press option 1, then option 2 to schedule your procedure. You may also choose a Gastroenterologist provider by name if you have someone you prefer. If you have any further questions or concerns, feel free to reach out to [place] at [facility].    Will send a message to hepatology regarding hepatic encephalopathy treatment and how lactulose and rifamixin not helping.    Labs as ordered. Plan to be based on results.     Thank you for choosing [facility] for your care!    If you have a question about a recent visit or a plan that was discussed with your provider in the past: Please go to [email or web address] (or your phone app) and sign in to your [facility] Chart to send this information. I do my best to respond to messages within 3 business days, but it is not always possible to provide the most timely and complete information. For more urgent questions, CALLING THE OFFICE is always the best option.    If you are experiencing any new or worsening symptoms or you would like to talk about changing a medication or medical plan; it is best to schedule an appointment: Please call our clinic at [phone] to schedule an appointment with your provider or the Acute Care Clinic or you can access the [facility] at [phone]. If you cannot decide if your issue warrants a visit, you can always ask to discuss this with one of our clinical care team.    If you have urgent healthcare needs after normal business hours, on weekends, or during holidays:  We have an Acute Care Clinic available by appointment. Call [phone] to schedule this. We offer care for health issues that do not require the emergency room.   Monday - Thursday (8am to 7pm)  Friday (8am to 5pm)   Saturdays (9am to 1pm)   You can always reach the [facility] 24/7 Nursing Line after hours to get nurse advice. The nurse can consult with the doctor on call if indicated. Just call our main office number and follow the prompts - [phone]     You may receive a patient satisfaction survey by mail, text or email regarding your visit today. Your opinion is important to me. Your response benefits [place] all :-)    If you had or will have testing done, test  results will be posted on [facility] or sent via letter.  A member of our Care Team will also contact you by message or phone if your results require follow-up.Please note that specialty test results take longer. Lab results may post before I review them. We will follow up as needed when I review them.    Lorel Monaco. Latorie Montesano, MD -   [facility]  [address], [place] [address]  Phone: [phone] Fax: [phone]  [email or web address]        Return in about 4 weeks (around 12/19/2022) for DIABETES.    Lorel Monaco.Cathey Endow, MD  Family Physician    [facility]  [address]  [place], [place] [address]  Ph [phone]  Fax [phone]    Answers submitted by the patient for this visit:  Diabetes Questionnaire (Submitted on 11/21/2022)  Chief Complaint: Diabetes problem  Diabetes type: type 2  MedicAlert ID: Yes  Disease duration: 3 Years  blurred vision: Yes  fatigue: Yes  foot paresthesias: Yes  visual change: Yes  weakness: Yes  Symptom course: worsening  confusion: Yes  dizziness: Yes  speech difficulty: Yes  tremors: Yes  required assistance: Yes  CAD risks: family history, obesity, stress  Current treatments: oral agent (dual therapy)  Treatment compliance: all of the time  Home blood tests: 1-2 x per day  Monitoring compliance: good  Blood glucose trend: decreasing steadily  breakfast time: 9-10 am  breakfast glucose level: 110-130  Weight trend: fluctuating minimally  Current diet: diabetic, generally healthy, low fat/cholesterol, low salt  Meal planning: avoidance of concentrated sweets  Exercise: daily  Dietitian visit: No  Eye exam current: Yes  Sees podiatrist: No

## 2022-11-21 NOTE — Unmapped (Signed)
Addended by: Dorene Sorrow on: 11/21/2022 04:21 PM     Modules accepted: Orders

## 2022-11-22 NOTE — Unmapped (Signed)
[facility] Refill Coordination Note    Specialty Lite Medication(s) to be Shipped:   Infectious Disease: Xifaxan    Other medication(s) to be shipped: No additional medications requested for fill at this time     Denise York, DOB: 21-May-1966  Phone: [phone] (home)       All above HIPAA information was verified with patient.     Was a Nurse, learning disability used for this call? No    Changes to medications: Maedean reports no changes at this time.  Changes to insurance: No      REFERRAL TO PHARMACIST     Referral to the pharmacist: Not needed      [facility]     Shipping address confirmed in Epic.     Delivery Scheduled: Yes, Expected medication delivery date: 5/21.     Medication will be delivered via UPS to the prescription address in Epic WAM.    Julianne Rice, PharmD   [facility] Specialty Pharmacist

## 2022-11-23 MED ORDER — SPIRONOLACTONE 100 MG TABLET
ORAL_TABLET | Freq: Every day | ORAL | 1 refills | 90 days | Status: CP
Start: 2022-11-23 — End: ?

## 2022-11-23 MED ORDER — TRAZODONE 50 MG TABLET
ORAL_TABLET | Freq: Every evening | ORAL | 3 refills | 90 days | Status: CP
Start: 2022-11-23 — End: ?

## 2022-11-23 MED ORDER — DULOXETINE 20 MG CAPSULE,DELAYED RELEASE
ORAL_CAPSULE | Freq: Every day | ORAL | 1 refills | 90 days | Status: CP
Start: 2022-11-23 — End: 2023-11-23

## 2022-11-23 MED ORDER — ROPINIROLE 0.25 MG TABLET
ORAL_TABLET | Freq: Two times a day (BID) | ORAL | 1 refills | 90 days | Status: CP
Start: 2022-11-23 — End: 2023-11-23

## 2022-11-23 MED ORDER — FUROSEMIDE 40 MG TABLET
ORAL_TABLET | Freq: Every day | ORAL | 1 refills | 90 days | Status: CP
Start: 2022-11-23 — End: ?

## 2022-11-25 MED FILL — XIFAXAN 550 MG TABLET: ORAL | 90 days supply | Qty: 180 | Fill #1

## 2022-12-10 DIAGNOSIS — K766 Portal hypertension: Principal | ICD-10-CM

## 2022-12-10 MED ORDER — ROPINIROLE 0.25 MG TABLET
ORAL_TABLET | Freq: Two times a day (BID) | ORAL | 1 refills | 90 days
Start: 2022-12-10 — End: 2023-12-10

## 2022-12-10 MED ORDER — ATORVASTATIN 10 MG TABLET
ORAL_TABLET | Freq: Every day | ORAL | 3 refills | 90 days
Start: 2022-12-10 — End: 2023-12-10

## 2022-12-10 MED ORDER — SPIRONOLACTONE 100 MG TABLET
ORAL_TABLET | Freq: Every day | ORAL | 1 refills | 90 days
Start: 2022-12-10 — End: ?

## 2022-12-10 MED ORDER — FUROSEMIDE 40 MG TABLET
ORAL_TABLET | Freq: Every day | ORAL | 1 refills | 90 days
Start: 2022-12-10 — End: ?

## 2022-12-10 MED ORDER — OZEMPIC 1 MG/DOSE (4 MG/3 ML) SUBCUTANEOUS PEN INJECTOR
SUBCUTANEOUS | 35 refills | 0 days
Start: 2022-12-10 — End: ?

## 2022-12-11 MED ORDER — ROPINIROLE 0.25 MG TABLET
ORAL_TABLET | Freq: Two times a day (BID) | ORAL | 1 refills | 90 days | Status: CP
Start: 2022-12-11 — End: 2023-12-11

## 2022-12-11 MED ORDER — FUROSEMIDE 40 MG TABLET
ORAL_TABLET | Freq: Every day | ORAL | 1 refills | 90 days | Status: CP
Start: 2022-12-11 — End: ?

## 2022-12-11 MED ORDER — OZEMPIC 1 MG/DOSE (4 MG/3 ML) SUBCUTANEOUS PEN INJECTOR
SUBCUTANEOUS | 35 refills | 28 days
Start: 2022-12-11 — End: ?

## 2022-12-11 MED ORDER — OZEMPIC 2 MG/DOSE (8 MG/3 ML) SUBCUTANEOUS PEN INJECTOR
SUBCUTANEOUS | 3 refills | 84 days | Status: CP
Start: 2022-12-11 — End: 2023-12-11

## 2022-12-11 MED ORDER — SPIRONOLACTONE 100 MG TABLET
ORAL_TABLET | Freq: Every day | ORAL | 1 refills | 90 days | Status: CP
Start: 2022-12-11 — End: ?

## 2022-12-11 NOTE — Unmapped (Signed)
Yes, patient wants to use [facility] in [place]. Is doing 2 mg weekly of Ozempic.

## 2022-12-11 NOTE — Unmapped (Signed)
Please confirm dose of ozempic she is on with patient and confirm we are sending in scripts to new pharmacy.

## 2022-12-11 NOTE — Unmapped (Signed)
Patient is requesting the following refill  Requested Prescriptions     Pending Prescriptions Disp Refills    furosemide (LASIX) 40 MG tablet [Pharmacy Med Name: FUROSEMIDE 40 MG TABLET] 90 tablet 1     Sig: TAKE 1 TABLET BY MOUTH EVERY DAY    OZEMPIC 1 mg/dose (4 mg/3 mL) PnIj injection [Pharmacy Med Name: OZEMPIC 4 MG/3 ML (1 MG/DOSE)]  35     Sig: INJECT 1 MG UNDER THE SKIN EVERY SEVEN (7) DAYS.    rOPINIRole (REQUIP) 0.25 MG tablet [Pharmacy Med Name: ROPINIROLE HCL 0.25 MG TABLET] 180 tablet 1     Sig: TAKE 1 TABLET (0.25 MG TOTAL) BY MOUTH TWO (2) TIMES A DAY.    spironolactone (ALDACTONE) 100 MG tablet [Pharmacy Med Name: SPIRONOLACTONE 100 MG TABLET] 90 tablet 1     Sig: TAKE 1 TABLET BY MOUTH EVERY DAY     Refused Prescriptions Disp Refills    atorvastatin (LIPITOR) 10 MG tablet [Pharmacy Med Name: ATORVASTATIN 10 MG TABLET] 90 tablet 3     Sig: Take 1 tablet (10 mg total) by mouth daily with evening meal.       Recent Visits  Date Type Provider Dept   11/21/22 Office Visit Bowen, Heywood Footman, MD [facility]   09/10/22 Office Visit Bowen, Heywood Footman, MD [facility]   08/14/22 Office Visit Bowen, Heywood Footman, MD [facility]   07/09/22 Office Visit Bowen, Heywood Footman, MD [facility]   05/14/22 Office Visit Bowen, Heywood Footman, MD [facility]   05/08/22 Office Visit Bowen, Heywood Footman, MD [facility] [place]   Showing recent visits within past 365 days with a meds authorizing provider and meeting all other requirements  Future Appointments  Date Type Provider Dept   12/19/22 Appointment Bowen, Heywood Footman, MD [facility] [place]   Showing future appointments within next 365 days with a meds authorizing provider and meeting all other requirements       Labs: A1c:   Hemoglobin A1c (%)   Date Value 06/15/2014 6.5 (H)     Hemoglobin A1C (%)   Date Value   09/26/2022 7.7 (H)   05/08/2022 5.6    Creatinine:   Creatinine (mg/dL)   Date Value   95/62/1308 0.88   06/26/2015 0.68    Potassium:   Potassium (mmol/L)   Date Value   09/26/2022 4.6   06/26/2015 4.5    Sodium:   Sodium (mmol/L)   Date Value   09/26/2022 138   06/26/2015 136    Vitals:   BP Readings from Last 3 Encounters:   11/21/22 110/74   11/08/22 103/63   09/26/22 128/75    and   Pulse Readings from Last 3 Encounters:   11/21/22 73   11/08/22 79   09/26/22 75

## 2022-12-16 NOTE — Unmapped (Addendum)
[facility] Cardiology at [facility]  [address], [place], [place] [address]   Phone: [phone]    Date of Service: 12/17/2022    Patient Clinic Note    PCP: Referring Provider:   Marca Ancona, MD  [address] [facility] [place] [address]  Phone: [phone]  Fax: [phone] Farrel Conners, MD  [address] [place],  [place] [address]  Phone: [phone]  Fax: [phone]       Assessment and Plan:     Denise York is a 57 y.o. female with past medical history of DMT2, strong family history of CAD, obesity, possible neurocognitive disorder who presents for cardiac evaluation.       Nonobstructive coronary artery disease  - Statin therapy indicated given concurrent diabetes.  I have messaged her liver team to ensure they are okay with starting atorvastatin 20 mg daily --> they are okay with this, prescription sent.    [place]und[place] ASD status post closure  - Doing well afterwards, with resolution of her right-sided size and function.  In addition lower extremity swelling is less of an issue than it was in the past.  - Continue aspirin therapy lifelong, stop Plavix a little bit earlier due to GI side effects    Lower extremity swelling  - Trace lower extremity edema today.  Continue current therapies including spironolactone 100 mg daily, Lasix 40 mg daily.  Likely due to liver disease and possibly a component of HFpEF, although has significantly improved since closure of her syncope [place] ASD    Diabetes type 2  - A1c well-controlled.  Benefiting from GLP-1    Cirrhosis  - Follows with [facility] liver transplant team.  No plans for transplant in the near future.  Being managed medically and symptoms are stable.  Possible that her liver dysfunction was in part due to right-sided congestion from untreated ASD and hopefully symptoms will continue to improve.    Lab Results   Component Value Date    CHOL 125 05/09/2022    CHOL 123 11/09/2020    CHOL 137 08/21/2016 Lab Results   Component Value Date    HDL 49 05/09/2022    HDL 44 11/09/2020    HDL 45 08/21/2016     Lab Results   Component Value Date    LDL 65 05/09/2022    LDL 67 11/09/2020    LDL 75 08/21/2016     Lab Results   Component Value Date    VLDL 11.2 05/09/2022    VLDL 12 11/09/2020    VLDL 63.0 08/21/2016     Lab Results   Component Value Date    CHOLHDLRATIO 2.6 05/09/2022    CHOLHDLRATIO 2.8 11/09/2020    CHOLHDLRATIO 3.0 08/21/2016     Lab Results   Component Value Date    TRIG 56 05/09/2022    TRIG 60 11/09/2020    TRIG 87 08/21/2016       The 10-year ASCVD risk score (Arnett DK, et al., 2019) is: 3.2%    Values used to calculate the score:      Age: 29 years      Sex: Female      Is Non-Hispanic African American: No      Diabetic: Yes      Tobacco smoker: No      Systolic Blood Pressure: 115 mmHg      Is BP treated: Yes      HDL Cholesterol:  49 mg/dL      Total Cholesterol: 125 mg/dL    Note: For patients with SBP <90 or >200, Total Cholesterol <130 or >320, HDL <20 or >100 which are outside of the allowable range, the calculator will use these upper or lower values to calculate the patient???s risk score.      Lab Results   Component Value Date    A1C 7.7 (H) 09/26/2022         Return in about 1 year (around 12/17/2023).          Subjective:     Chief Complaint:  Chief Complaint   Patient presents with    Routine Follow-up         Referring Provider: Farrel Conners, MD    History of Present Illness:     [name] is a 57 y.o. female, with past medical history of DMT2, strong family history of CAD, obesity, possible neurocognitive disorder who presents for cardiac evaluation.     Overall patient notes she is feeling well.  Notes significant fatigue after showering, but no significant fatigue with exertion or her other daily activities.  Suspect related to the heat.    No significant lower extremity swelling, no chest pain.        -----Presenting HPI----  Patient notes intermittent chest pains that can occur with or without exertion.  She notes they are substernal, and and intermittently occur with left hand tingling and left arm pain.  She does not exercise regularly, but walking around her house does not typically exacerbate these pains.  She does note intermittent chest pains as long as shortness of breath and back pain when she is doing her household activities such as vacuuming.  This chest pain is described as stabbing, lasting for a few seconds, intermittently associated with lightheadedness as well.        Cardiovascular History & Procedures:  Cardiovascular Problems:  Diabetes type 2  Strong family history of coronary artery disease  Obesity    Cath / PCI:  Second of ASD closure 09/19/2022- Simmie Davies     CV Surgery:  None    EP Procedures and Devices:  None    Non-Invasive Evaluation(s): independently reviewed the most recent study.   Echocardiogram 07/2022-normal left ventricular size and function, right ventricular: Moderately dilated with normal systolic function.  Right atrial dilation.  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    Obesity     Prediabetes        Surgical History:  Past Surgical History:   Procedure Laterality Date    ABDOMINAL WALL MESH  REMOVAL  1997, 2005    Placement, 1997, removal and replacement 2005    BLADDER SURGERY      bladder tact X2    BREAST BIOPSY Right 04/2013    benign    BREAST EXCISIONAL BIOPSY Right 05/2013    benign    BREAST SURGERY Right     precancerous spot removed    CHOLECYSTECTOMY      HYSTERECTOMY  PR COLSC FLX W/RMVL OF TUMOR POLYP LESION SNARE TQ N/A 10/14/2016    Procedure: COLONOSCOPY FLEX; W/REMOV TUMOR/LES BY SNARE;  Surgeon: Alfred Levins, MD;  Location: [facility];  Service: Gastroenterology    PR PERC CLOS,CONG INTERATRIAL COMMUN W/IMPL N/A 09/12/2022    Procedure: ASD closure;  Surgeon: Jacquelyne Balint, MD;  Location: [facility] CATH;  Service: Cardiology    SKIN BIOPSY         Social History:   reports that she has never smoked. She has never used smokeless tobacco. She reports that she does not drink alcohol and does not use drugs.  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    Review of Systems:   Except as noted in the HPI, the remainder of 10 systems reviewed is negative.     Allergies:  Allergies   Allergen Reactions    Penicillins Hives    Shellfish Containing Products Anaphylaxis    Glipizide Headache    Lisinopril Cough       Medications:   Prior to Admission medications    Medication Dose, Route, Frequency   aspirin (ECOTRIN) 81 MG tablet 81 mg, Oral, Daily (standard)   blood sugar diagnostic (GLUCOSE BLOOD) Strp Use to test blood sugar daily   blood-glucose meter kit Use to test blood sugar DAILY   carvediloL (COREG) 6.25 MG tablet 6.25 mg, Oral, 2 times a day (standard)   cholecalciferol, vitamin D3, 2,000 unit cap 2 capsules, Oral, Daily (standard)  Patient taking differently: Take 1 capsule (50 mcg total) by mouth daily.   DULoxetine (CYMBALTA) 20 MG capsule 20 mg, Oral, Daily (standard)   furosemide (LASIX) 40 MG tablet 40 mg, Oral, Daily (standard)   inhalational spacing device (AEROCHAMBER MV) Spcr Use as directed with inhalers   lancets Misc Use to test blood sugar daily   loratadine (CLARITIN) 10 mg tablet TAKE 1 TABLET BY MOUTH EVERY DAY   magnesium oxide (MAG-OX) 400 mg (241.3 mg elemental magnesium) tablet 400 mg, Oral, Daily (standard)   metFORMIN (GLUCOPHAGE-XR) 500 MG 24 hr tablet 500 mg, Oral, Daily   multivitamin (TAB-A-VITE/THERAGRAN) per tablet 1 tablet, Oral, Daily (standard)   psyllium seed, with sugar, (FIBER ORAL) 4 capsules, Oral, Daily (standard), BJs brand.    rifAXIMin (XIFAXAN) 550 mg Tab 550 mg, Oral, 2 times a day (standard)   rOPINIRole (REQUIP) 0.25 MG tablet 0.25 mg, Oral, 2 times a day   semaglutide (OZEMPIC) 2 mg/dose (8 mg/3 mL) PnIj 2 mg, Subcutaneous, Every 7 days   spironolactone (ALDACTONE) 100 MG tablet 100 mg, Oral, Daily (standard)   traZODone (DESYREL) 50 MG tablet 50 mg, Oral, Nightly        Objective:     Vitals  BP 115/58 (BP Site: L Arm, BP Position: Sitting, BP Cuff Size: Medium)  - Pulse 88  - Wt 92.4 kg (203 lb 9.6 oz)  - SpO2 98%  - BMI 33.88 kg/m??      Wt Readings from Last 3 Encounters:   12/17/22 92.4 kg (203 lb 9.6 oz)   11/21/22  94.5 kg (208 lb 6.4 oz)   11/08/22 96.3 kg (212 lb 3.2 oz)       Physical Exam  General:  Pleasant female sitting in chair in nad.   Neck: Supple, JVP normal.   Resp:   CTAB bilaterally with normal WOB.   Cardio:  RRR without m/r/g.   Abdomen:   Soft, non-distended, non-tender.   Extremities: Warm well-perfused bilaterally. No edema .   MSK: No joint swelling or erythema. No gross deformities.   Skin: No rashes   Neuro: CN II-XII grossly intact. Strength grossly intact.    Psych: Alert and oriented x3. Appropriate mood.      ECG (12/17/22) - independently interpreted.   None today-last EKG 09/12/2022-normal sinus rhythm, normal axis and intervals, normal ECG    Most Recent Labs   Lab Results   Component Value Date    NA 138 09/26/2022    K 4.6 09/26/2022    CL 105 09/26/2022    CO2 26.0 09/26/2022     Lab Results   Component Value Date    BUN 11 09/26/2022    BUN 14 09/13/2022    BUN 13 06/26/2015    BUN 8 06/15/2014     Lab Results   Component Value Date    Creatinine 0.88 09/26/2022    Creatinine 0.94 09/13/2022    Creatinine 0.68 06/26/2015    Creatinine 0.78 06/15/2014     Lab Results   Component Value Date    PRO-BNP 41.8 03/11/2019     Lab Results   Component Value Date    Cholesterol 125 05/09/2022    Cholesterol, Total 126 06/26/2015    Triglycerides 56 05/09/2022    Triglycerides 85 06/26/2015    HDL 49 05/09/2022    HDL 45 06/26/2015    Non-HDL Cholesterol 76 05/09/2022    LDL Calculated 65 05/09/2022    LDL Calculated 64 06/26/2015

## 2022-12-17 ENCOUNTER — Ambulatory Visit
Admit: 2022-12-17 | Discharge: 2022-12-18 | Payer: PRIVATE HEALTH INSURANCE | Attending: Student in an Organized Health Care Education/Training Program | Primary: Student in an Organized Health Care Education/Training Program

## 2022-12-17 MED ORDER — ATORVASTATIN 20 MG TABLET
ORAL_TABLET | Freq: Every day | ORAL | 3 refills | 90 days | Status: CP
Start: 2022-12-17 — End: 2023-12-17

## 2022-12-17 NOTE — Unmapped (Signed)
Thank you for coming to see Denise York today at [facility] Cardiology at [facility]. Please call Denise York at [phone] if you have any questions.

## 2022-12-17 NOTE — Unmapped (Signed)
Addended byLovenia Shuck on: 12/17/2022 03:25 PM     Modules accepted: Orders

## 2022-12-19 ENCOUNTER — Ambulatory Visit: Admit: 2022-12-19 | Payer: PRIVATE HEALTH INSURANCE

## 2022-12-19 ENCOUNTER — Ambulatory Visit: Admit: 2022-12-19 | Discharge: 2022-12-20 | Payer: PRIVATE HEALTH INSURANCE

## 2022-12-19 DIAGNOSIS — D696 Thrombocytopenia, unspecified: Principal | ICD-10-CM

## 2022-12-19 DIAGNOSIS — K7581 Nonalcoholic steatohepatitis (NASH): Principal | ICD-10-CM

## 2022-12-19 DIAGNOSIS — Z7689 Persons encountering health services in other specified circumstances: Principal | ICD-10-CM

## 2022-12-19 DIAGNOSIS — K7460 Unspecified cirrhosis of liver: Principal | ICD-10-CM

## 2022-12-19 DIAGNOSIS — S300XXA Contusion of lower back and pelvis, initial encounter: Principal | ICD-10-CM

## 2022-12-19 DIAGNOSIS — K7682 Hepatic encephalopathy (CMS-HCC): Principal | ICD-10-CM

## 2022-12-19 DIAGNOSIS — L0231 Cutaneous abscess of buttock: Principal | ICD-10-CM

## 2022-12-19 MED ADMIN — lidocaine-EPINEPHrine (PF) (XYLOCAINE W/EPI) 2 %-1:200,000 injection 3 mL: 3 mL | INTRAMUSCULAR | @ 21:00:00 | Stop: 2022-12-19

## 2022-12-19 NOTE — Unmapped (Signed)
 Assessment and Plan:     Diagnoses and all orders for this visit:    Encounter for incision and drainage procedure  -     INCISION AND DRAINAGE  -     lidocaine-EPINEPHrine (PF) (XYLOCAINE W/EPI) 2 %-1:200,000 injection 3 mL    Abscess of left buttock      Hematoma of left buttock    I was asked by Dr. Cathey Endow to perform I & D in her place.    Pt prepped with Povidine swabs x 3  ~48mL of 2% Xylocaine w/Epi injected w/27 gauge 1.25 needle  11 blade used to create ~ 1.5cm incision in fluctuant region of abscess  2 small clots ~ 1cm diameter came out immediately + BRB (< 1cc blood loss)  Curved hemostat used to break up any loculations  No purulent drainage or loculations noted  No packing used  Gauze + Pads provided to pt    Pt tolerated procedure well.    We discussed return and emergency precautions and when to present to ED or seek sooner follow up visit for above conditions. Patient/caregiver expressed understanding.      Subjective:     HPI: Denise York is a 57 y.o. female here for painful lump left buttock x 2 wks. Pt admits to having a similar issue on the right buttock in the past.    Denies: Fever/Chills, drainage, surrounding redness      I have reviewed past medical, surgical, medications, allergies, social and family histories today and updated them in Epic where appropriate.      ROS:     Review of systems negative unless otherwise noted as per HPI.     Objective:     There were no vitals filed for this visit.  There is no height or weight on file to calculate BMI.    SKIN: Tender ~ 7cm x 5cm oblong indurated mass on Left lower buttock extending to inferior edge of Left labia majora with fluctuance medially; crescent ecchymosis/hematoma lateral third of mass; no surrounding erythema, edema, heat to touch, foul odor, drainage/bleeding noted    Vernell Leep, PA

## 2022-12-19 NOTE — Unmapped (Unsigned)
Chief Complaint:  Chief Complaint   Patient presents with    Diabetes    Follow-up     Patient wants to discuss referrals for ENT and podiatrist. Patient has a boil on her left side of buttocks, size of golf ball, patient noticed it about 2 weeks: no itching, feels like tearing skin away when getting up or sitting. She has had small ones in the past that go away, and had one in the past that had to be lanced.  Discuss upping the trazadone    Shaking     Sometimes patient experiences jitters where her hands, legs, feet will get to shaking bad. Sometimes it makes it difficult to get up when it is in the legs. Last time patient noticed it occurring this morning when she got up out of bed. Happens at night and in morning. Frequency at an average of 3x a week, sometimes more.    Ear Fullness     Patient left ear feels like she's in a tunnel, and a fullness. Has been experiencing daily since last visit with Dr. Cathey Endow, forgot to ask for a referral.       This patients last WCC/CPE date: : Not Found  This patient's last AWV date: [facility] Last Medicare Wellness Visit Date: Not Found    History of Present Illness:  Denise York is a 57 y.o. female with past medical history as below presents today for evaluation/follow up of ***.    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    Obesity     Prediabetes      Patient Active Problem List   Diagnosis    Insomnia    Vitamin deficiency    Dysthymic disorder    Restless leg syndrome    Urinary frequency    Incomplete bladder emptying Nonspecific abnormal results of liver function study    Edema    Vitamin D deficiency    Itch    Fatty liver disease, nonalcoholic    Constipation    Depression    Neoplasm of right breast, 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    Type 2 diabetes mellitus without complication (CMS-HCC)    Bowel incontinence    Mixed incontinence urge and stress    Encounter for long-term current use of medication    Obesity    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    Back pain    Neoplasm of uncertain behavior    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)    Portal hypertension (CMS-HCC)    Hepatic encephalopathy (CMS-HCC)    Thrombocytopenia (CMS-HCC)    Melena    Lower extremity edema    ASD (atrial septal defect)    Nausea and vomiting     OB History       Gravida   2    Para   2    Term   2    Preterm  AB        Living   0         SAB        IAB        Ectopic        Molar        Multiple        Live Births   2              Past Surgical History:   Procedure Laterality Date    ABDOMINAL WALL MESH  REMOVAL  1997, 2005    Placement, 1997, removal and replacement 2005    BLADDER SURGERY      bladder tact X2    BREAST BIOPSY Right 04/2013    benign    BREAST EXCISIONAL BIOPSY Right 05/2013    benign    BREAST SURGERY Right     precancerous spot removed    CHOLECYSTECTOMY      HYSTERECTOMY      PR COLSC FLX W/RMVL OF TUMOR POLYP LESION SNARE TQ N/A 10/14/2016    Procedure: COLONOSCOPY FLEX; W/REMOV TUMOR/LES BY SNARE;  Surgeon: Alfred Levins, MD;  Location: [facility] [place];  Service: Gastroenterology    PR PERC CLOS,CONG INTERATRIAL COMMUN W/IMPL N/A 09/12/2022    Procedure: ASD closure;  Surgeon: Jacquelyne Balint, MD;  Location: [facility] CATH;  Service: Cardiology    SKIN BIOPSY         Allergies:  Penicillins, Shellfish containing products, Glipizide, and Lisinopril    Current Outpatient Medications   Medication Sig Dispense Refill    aspirin (ECOTRIN) 81 MG tablet Take 1 tablet (81 mg total) by mouth daily.      atorvastatin (LIPITOR) 20 MG tablet Take 1 tablet (20 mg total) by mouth daily. 90 tablet 3    blood sugar diagnostic (GLUCOSE BLOOD) Strp Use to test blood sugar daily 100 strip 3    blood-glucose meter kit Use to test blood sugar DAILY 1 each 1    carvediloL (COREG) 6.25 MG tablet Take 1 tablet (6.25 mg total) by mouth two (2) times a day. 180 tablet 3    cholecalciferol, vitamin D3, 2,000 unit cap Take 2 capsules (4,000 Units total) by mouth daily. (Patient taking differently: Take 1 capsule (50 mcg total) by mouth daily.) 1 each 0    DULoxetine (CYMBALTA) 20 MG capsule Take 1 capsule (20 mg total) by mouth daily. 90 capsule 1    furosemide (LASIX) 40 MG tablet TAKE 1 TABLET BY MOUTH EVERY DAY 90 tablet 1    inhalational spacing device (AEROCHAMBER MV) Spcr Use as directed with inhalers 1 each 0    lancets Misc Use to test blood sugar daily 100 each 3    loratadine (CLARITIN) 10 mg tablet TAKE 1 TABLET BY MOUTH EVERY DAY 90 tablet 1    magnesium oxide (MAG-OX) 400 mg (241.3 mg elemental magnesium) tablet Take 1 tablet (400 mg total) by mouth daily.      metFORMIN (GLUCOPHAGE-XR) 500 MG 24 hr tablet TAKE 1 TABLET BY MOUTH DAILY WITH EVENING MEAL. 90 tablet 1    multivitamin (TAB-A-VITE/THERAGRAN) per tablet Take 1 tablet by mouth daily.      psyllium seed, with sugar, (FIBER ORAL) Take 4 capsules by mouth daily. [employer]Js brand.      rifAXIMin (XIFAXAN) 550 mg Tab Take 1 tablet (550 mg total) by mouth two (2) times a day. 180 tablet 3    rOPINIRole (REQUIP) 0.25 MG tablet TAKE  1 TABLET (0.25 MG TOTAL) BY MOUTH TWO (2) TIMES A DAY. 180 tablet 1    semaglutide (OZEMPIC) 2 mg/dose (8 mg/3 mL) PnIj Inject 2 mg under the skin every seven (7) days. 9 mL 3    spironolactone (ALDACTONE) 100 MG tablet TAKE 1 TABLET BY MOUTH EVERY DAY 90 tablet 1    traZODone (DESYREL) 50 MG tablet Take 1 tablet (50 mg total) by mouth nightly. 90 tablet 3     No current facility-administered medications for this visit.     Social History     Socioeconomic History    Marital status: Married     Spouse name: None    Number of children: None    Years of education: None    Highest education level: None   Tobacco Use    Smoking status: Never     Passive exposure: Past    Smokeless tobacco: Never   Substance and Sexual Activity    Alcohol use: No    Drug use: No    Sexual activity: Not Currently   Other Topics Concern    Do you use sunscreen? Yes    Excessive sun exposure? Yes   Social History Narrative    2 births. No living children. Married new husband 2014.         02/21/2017        PC[employer] Components:        Family, social, cultural characteristics: social support includes best friend .      Patient has the following communication needs: none, per patient     Health Literacy: How confident are you that you understand your health issues/concerns, can participate in your care, and manage your care along with your physician: confident.    Behaviors Affecting Health: none, per patient    Family history of mental health illness and/or substance abuse: asked patient/parent and none disclosed.    Have you been seen by any medical provider that we have not referred you to since your last visit ? No    Discussed a Living Will with the patient and ZOX:WRUEAVW is under the age of 20.      Social Determinants of Health     Food Insecurity: Patient Declined (12/19/2022)    Hunger Vital Sign     Worried About Running Out of Food in the Last Year: Patient declined     Ran Out of Food in the Last Year: Patient declined     Family History   Problem Relation Age of Onset    Heart disease Mother     Hypertension Mother     Heart disease Father     Kidney disease Father     Allergies Father     Gout Father     Allergies Other         FAMILY H/O    Coronary artery disease Other         FAMILY H/O    Kidney failure Other         FAMILY H/O    No Known Problems Sister     No Known Problems Daughter     No Known Problems Maternal Grandmother     No Known Problems Maternal Grandfather     No Known Problems Paternal Grandmother     No Known Problems Paternal Grandfather     BRCA 1/2 Neg Hx     Breast cancer Neg Hx     Cancer Neg Hx     Colon cancer  Neg Hx     Endometrial cancer Neg Hx     Ovarian cancer Neg Hx     Substance Abuse Disorder Neg Hx     Mental illness Neg Hx      Immunization History   Administered Date(s) Administered    COVID-19 VAC,MRNA,TRIS(12Y UP)(PFIZER)(GRAY CAP) 08/19/2020    COVID-19 VACC,MRNA,(PFIZER)(PF) 10/02/2019, 10/23/2019    Covid-19 Vac, (33yr+) (Comirnaty) [employer]  04/08/2022    HEPATITIS B VACCINE ADULT, ADJUVANTED, IM(HEPLISAV B) 06/17/2022, 09/26/2022    Hepatitis A (Adult) 06/17/2022, 09/26/2022    INFLUENZA INJ MDCK PF, QUAD,(FLUCELVAX)(6MO AND UP EGG FREE) 05/15/2021    INFLUENZA TIV (TRI) PF (IM) 04/09/2011    Influenza TRI (IIV3) 5+yrs MDV 06/04/2013    Influenza Vaccine Quad(IM)6 MO-Adult(PF) 05/08/2017, 04/09/2018, 03/11/2019    Influenza Virus Vaccine, unspecified formulation 04/08/2015, 04/07/2016, 05/04/2020, 04/08/2022    PNEUMOCOCCAL POLYSACCHARIDE 23-VALENT 07/19/2014    Pneumococcal Conjugate 20-valent 06/17/2022    SHINGRIX-ZOSTER VACCINE (HZV),RECOMBINANT,ADJUVANTED(IM) 11/09/2020, 05/15/2021    TdaP 07/16/2011, 06/17/2022       Health Maintenance   Topic Date Due    Colon Cancer Screening  10/14/2021    Retinal Eye Exam  11/21/2022    Hemoglobin A1c  12/27/2022    Foot Exam  05/09/2023    Urine Albumin/Creatinine Ratio  05/09/2023    Serum Creatinine Monitoring  09/26/2023    Potassium Monitoring  09/26/2023    Mammogram Start Age 58  10/28/2023    DTaP/Tdap/Td Vaccines (3 - Td or Tdap) 06/17/2032    Pneumococcal Vaccine 0-64  Completed    Hepatitis C Screen  Completed    COVID-19 Vaccine  Completed    Influenza Vaccine  Completed    Zoster Vaccines  Completed       I have reviewed and (if needed) updated the patient's problem list, medications, allergies, past medical and surgical history, social and family history ***    Review of Systems:  As per HPI ***    Physical Exam:  Vital Signs:  Vitals:    12/19/22 1437   BP: 102/62   Pulse: 84   Resp: 16   Temp: 36.7 ??C (98.1 ??F)   SpO2: 97%     Height: 163 cm (5' 4.17)    Body mass index is 35.75 kg/m??.  Wt Readings from Last 3 Encounters:   12/19/22 95 kg (209 lb 6.4 oz)   12/17/22 92.4 kg (203 lb 9.6 oz)   11/21/22 94.5 kg (208 lb 6.4 oz)     No LMP recorded. Patient has had a hysterectomy.    General: Well nourished, well developed, no acute distress. See BMI.  Head: Normocephalic, Atraumatic  Eyes: Sclera and conjunctiva clear, no drainage.PERRL, EOMI bilaterally.  Ears: External ears normal, Canals clear,  TMs with normal light reflex  Nose Nares***, No sinus TTP***  Mouth mucus membranes moist, posterior oropharynx without erythema or exudates. dentition ***.  Neck: Supple, normal ROM, no thyromegaly.  Lymph Nodes: No cervical or supraclavicular lymphadenopathy.  Skin: No rashes or obvious concerning lesions on exposed skin.  Cardiovascular: RRR, normal S1/S2, no murmurs, rubs or gallops. No chest wall TTP.  Lungs: CTA bilaterally, without crackles/wheezes/rhonchi, good air movement, no increased WOB  Abdomen:  Soft, non-distended, non-tender, no HSM or masses. Normal bowel sounds.***    Extremities: No edema, pulses 2 +  PT DP ***  Musculoskeletal: Normal tone UEs/LEs  Neurologic: CNs 2-12 grossly intact, balance normal. Gross motor/fine motor normal.  Psychiatry: Pleasant affect, well kept, no  abnormalities.    Imaging Studies:  {lmbimaging:29746::No imaging ordered this visit.}    Labs:  No results found for this or any previous visit (from the past 672 hour(s)).    Assessment & Plan: Pathophysiology of condition(s) reviewed with patient if applicable. Pt stated understanding and there were no barriers to learning. *** SEE PATIENT INSTRUCTIONS BELOW FOR DISCUSSION/PLAN    {No diagnosis found. (Refresh or delete this SmartLink)}    Patient Instructions   SPECIFIC INSTRUCTIONS WE DISCUSSED TODAY:  In order to schedule your colonoscopy/endoscopy procedure, please contact [facility] Central Scheduling at [phone]. You may press option 1, then option 2 to schedule your procedure. You may also choose a Gastroenterologist provider by name if you have someone you prefer. If you have any further questions or concerns, feel free to reach out to [place] at [facility].   ***    Thank you for choosing [facility] for your care!    If you have a question about a recent visit or a plan that was discussed with your provider in the past: Please go to [email or web address] (or your phone app) and sign in to your [facility] Chart to send this information. I do my best to respond to messages within 3 business days, but it is not always possible to provide the most timely and complete information. For more urgent questions, CALLING THE OFFICE is always the best option.    If you are experiencing any new or worsening symptoms or you would like to talk about changing a medication or medical plan; it is best to schedule an appointment: Please call our clinic at [phone] to schedule an appointment with your provider or the Acute Care Clinic or you can access the [facility] at [phone]. If you cannot decide if your issue warrants a visit, you can always ask to discuss this with one of our clinical care team.    If you have urgent healthcare needs after normal business hours, on weekends, or during holidays:  We have an Acute Care Clinic available by appointment. Call [phone] to schedule this. We offer care for health issues that do not require the emergency room.   Monday - Thursday (8am to 7pm)  Friday (8am to 5pm)   Saturdays (9am to 1pm)   You can always reach the [facility] 24/7 Nursing Line after hours to get nurse advice. The nurse can consult with the doctor on call if indicated. Just call our main office number and follow the prompts - [phone]     You may receive a patient satisfaction survey by mail, text or email regarding your visit today. Your opinion is important to me. Your response benefits [place] all :-)    If you had or will have testing done, test results will be posted on [facility] or sent via letter.  A member of our Care Team will also contact you by message or phone if your results require follow-up.Please note that specialty test results take longer. Lab results may post before I review them. We will follow up as needed when I review them.    Lorel Monaco. Dona Klemann, MD -   [facility] - [facility]  [address], [place] [address]  Phone: [phone] Fax: [phone]  [email or web address]        No follow-ups on file.    Lorel Monaco.Japleen Tornow, MD  Family Physician    [facility]  [address]  [place], [place] [address]  Ph [phone]  Fax [phone]    Answers submitted by the patient for this visit:  Ear Pain Questionnaire (Submitted on 12/16/2022)  Chief Complaint: Ear pain  Affected ear: left  Chronicity: new  Onset: 1 to 4 weeks ago  Progression since onset: waxing and waning  Frequency: every few hours  Fever: no fever  Pain - numeric: 8/10  headaches: Yes  hearing loss: Yes  neck pain: Yes

## 2022-12-20 NOTE — Unmapped (Signed)
Chief Complaint:       Chief Complaint   Patient presents with    Diabetes    Follow-up       Patient wants to discuss referrals for ENT and podiatrist. Patient has a boil on her left side of buttocks, size of golf ball, patient noticed it about 2 weeks: no itching, feels like tearing skin away when getting up or sitting. She has had small ones in the past that go away, and had one in the past that had to be lanced.  Discuss upping the trazadone    Shaking       Sometimes patient experiences jitters where her hands, legs, feet will get to shaking bad. Sometimes it makes it difficult to get up when it is in the legs. Last time patient noticed it occurring this morning when she got up out of bed. Happens at night and in morning. Frequency at an average of 3x a week, sometimes more.    Ear Fullness       Patient left ear feels like she's in a tunnel, and a fullness. Has been experiencing daily since last visit with Dr. Cathey Endow, forgot to ask for a referral.         This patients last WCC/CPE date: : Not Found  This patient's last AWV date: [facility] Last Medicare Wellness Visit Date: Not Found     History of Present Illness:  Denise York is a 57 y.o. female with past medical history as below presents today for evaluation/follow up of several issues:  1) ears feel full and popping a lot. Has hx of allergies. Feels like left hearing worsen than right. Not that congested nasally. Taking nonsedating antihistamine and flonase regularly with no improvement. Has been able to tritrate up on 15 mls lactulose 3-4 times a day and for the most part has at least 2 loose stools a day. Adding metamucil if she needs to (got this confused with miralax recommended by GI doctor).  2) Having toenail fungus and wonders what she can do about it. Discussed how being under treatment for hepatitis, you should discuss with your mom and doctor. Finally, has large boil like lesion inner side of left buttock sloset to perineum.      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    Obesity      Prediabetes           Problem List       Patient Active Problem List   Diagnosis    Insomnia    Vitamin deficiency    Dysthymic disorder    Restless leg syndrome    Urinary frequency    Incomplete bladder emptying    Nonspecific abnormal results of liver function study    Edema    Vitamin D deficiency    Itch    Fatty liver disease, nonalcoholic    Constipation  Depression    Neoplasm of right breast, 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    Type 2 diabetes mellitus without complication (CMS-HCC)    Bowel incontinence    Mixed incontinence urge and stress    Encounter for long-term current use of medication    Obesity    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    Back pain    Neoplasm of uncertain behavior    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)    Portal hypertension (CMS-HCC)    Hepatic encephalopathy (CMS-HCC)    Thrombocytopenia (CMS-HCC)    Melena    Lower extremity edema    ASD (atrial septal defect)    Nausea and vomiting         OB History         Gravida   2    Para   2    Term   2    Preterm        AB        Living   0           SAB        IAB        Ectopic        Molar        Multiple        Live Births   2                Past Surgical History         Past Surgical History:   Procedure Laterality Date    ABDOMINAL WALL MESH  REMOVAL   1997, 2005     Placement, 1997, removal and replacement 2005    BLADDER SURGERY bladder tact X2    BREAST BIOPSY Right 04/2013     benign    BREAST EXCISIONAL BIOPSY Right 05/2013     benign    BREAST SURGERY Right       precancerous spot removed    CHOLECYSTECTOMY        HYSTERECTOMY        PR COLSC FLX W/RMVL OF TUMOR POLYP LESION SNARE TQ N/A 10/14/2016     Procedure: COLONOSCOPY FLEX; W/REMOV TUMOR/LES BY SNARE;  Surgeon: Alfred Levins, MD;  Location: [facility];  Service: Gastroenterology    PR PERC CLOS,CONG INTERATRIAL COMMUN W/IMPL N/A 09/12/2022     Procedure: ASD closure;  Surgeon: Jacquelyne Balint, MD;  Location: [facility] C[facility];  Service: Cardiology    SKIN BIOPSY                Allergies:  Penicillins, Shellfish containing products, Glipizide, and Lisinopril     Current Medications          Current Outpatient Medications   Medication Sig Dispense Refill    aspirin (ECOTRIN) 81 MG tablet Take 1 tablet (81 mg total) by mouth daily.        atorvastatin (LIPITOR) 20 MG tablet Take 1 tablet (20 mg total) by mouth daily. 90 tablet 3    blood sugar diagnostic (GLUCOSE BLOOD) Strp Use to test blood sugar daily 100 strip 3    blood-glucose meter kit Use to test blood sugar DAILY 1 each 1    carvediloL (COREG) 6.25 MG tablet Take 1 tablet (6.25 mg total) by mouth two (2) times a day. 180 tablet 3    cholecalciferol, vitamin D3, 2,000 unit cap Take 2 capsules (  4,000 Units total) by mouth daily. (Patient taking differently: Take 1 capsule (50 mcg total) by mouth daily.) 1 each 0    DULoxetine (CYMBALTA) 20 MG capsule Take 1 capsule (20 mg total) by mouth daily. 90 capsule 1    furosemide (LASIX) 40 MG tablet TAKE 1 TABLET BY MOUTH EVERY DAY 90 tablet 1    inhalational spacing device (AEROCHAMBER MV) Spcr Use as directed with inhalers 1 each 0    lancets Misc Use to test blood sugar daily 100 each 3    loratadine (CLARITIN) 10 mg tablet TAKE 1 TABLET BY MOUTH EVERY DAY 90 tablet 1    magnesium oxide (MAG-OX) 400 mg (241.3 mg elemental magnesium) tablet Take 1 tablet (400 mg total) by mouth daily.        metFORMIN (GLUCOPHAGE-XR) 500 MG 24 hr tablet TAKE 1 TABLET BY MOUTH DAILY WITH EVENING MEAL. 90 tablet 1    multivitamin (TAB-A-VITE/THERAGRAN) per tablet Take 1 tablet by mouth daily.        psyllium seed, with sugar, (FIBER ORAL) Take 4 capsules by mouth daily. BJs brand.        rifAXIMin (XIFAXAN) 550 mg Tab Take 1 tablet (550 mg total) by mouth two (2) times a day. 180 tablet 3    rOPINIRole (REQUIP) 0.25 MG tablet TAKE 1 TABLET (0.25 MG TOTAL) BY MOUTH TWO (2) TIMES A DAY. 180 tablet 1    semaglutide (OZEMPIC) 2 mg/dose (8 mg/3 mL) PnIj Inject 2 mg under the skin every seven (7) days. 9 mL 3    spironolactone (ALDACTONE) 100 MG tablet TAKE 1 TABLET BY MOUTH EVERY DAY 90 tablet 1    traZODone (DESYREL) 50 MG tablet Take 1 tablet (50 mg total) by mouth nightly. 90 tablet 3      No current facility-administered medications for this visit.         Social History   Social History            Socioeconomic History    Marital status: Married       Spouse name: None    Number of children: None    Years of education: None    Highest education level: None   Tobacco Use    Smoking status: Never       Passive exposure: Past    Smokeless tobacco: Never   Substance and Sexual Activity    Alcohol use: No    Drug use: No    Sexual activity: Not Currently   Other Topics Concern    Do you use sunscreen? Yes    Excessive sun exposure? Yes   Social History Narrative     2 births. No living children. Married new husband 2014.            02/21/2017           PCMH Components:           Family, social, cultural characteristics: social support includes best friend .       Patient has the following communication needs: none, per patient      Health Literacy: How confident are you that you understand your health issues/concerns, can participate in your care, and manage your care along with your physician: confident.     Behaviors Affecting Health: none, per patient     Family history of mental health illness and/or substance abuse: asked patient/parent and none disclosed.     Have you been seen by any medical provider that we have  not referred you to since your last visit ? No     Discussed a Living Will with the patient and ZOX:WRUEAVW is under the age of 64.       Social Determinants of Health           Food Insecurity: Patient Declined (12/19/2022)     Hunger Vital Sign      Worried About Running Out of Food in the Last Year: Patient declined      Ran Out of Food in the Last Year: Patient declined         Family History         Family History   Problem Relation Age of Onset    Heart disease Mother      Hypertension Mother      Heart disease Father      Kidney disease Father      Allergies Father      Gout Father      Allergies Other           FAMILY H/O    Coronary artery disease Other           FAMILY H/O    Kidney failure Other           FAMILY H/O    No Known Problems Sister      No Known Problems Daughter      No Known Problems Maternal Grandmother      No Known Problems Maternal Grandfather      No Known Problems Paternal Grandmother      No Known Problems Paternal Grandfather      BRCA 1/2 Neg Hx      Breast cancer Neg Hx      Cancer Neg Hx      Colon cancer Neg Hx      Endometrial cancer Neg Hx      Ovarian cancer Neg Hx      Substance Abuse Disorder Neg Hx      Mental illness Neg Hx                Immunization History   Administered Date(s) Administered    COVID-19 VAC,MRNA,TRIS(12Y UP)(PFIZER)(GRAY CAP) 08/19/2020    COVID-19 VACC,MRNA,(PFIZER)(PF) 10/02/2019, 10/23/2019    Covid-19 Vac, (45yr+) (Comirnaty) [employer]  04/08/2022    HEPATITIS B VACCINE ADULT, ADJUVANTED, IM(HEPLISAV B) 06/17/2022, 09/26/2022    Hepatitis A (Adult) 06/17/2022, 09/26/2022    INFLUENZA INJ MDCK PF, QUAD,(FLUCELVAX)(49MO AND UP EGG FREE) 05/15/2021    INFLUENZA TIV (TRI) PF (IM) 04/09/2011    Influenza TRI (IIV3) 5+yrs MDV 06/04/2013    Influenza Vaccine Quad(IM)6 MO-Adult(PF) 05/08/2017, 04/09/2018, 03/11/2019    Influenza Virus Vaccine, unspecified formulation 04/08/2015, 04/07/2016, 05/04/2020, 04/08/2022    PNEUMOCOCCAL POLYSACCHARIDE 23-VALENT 07/19/2014    Pneumococcal Conjugate 20-valent 06/17/2022    SHINGRIX-ZOSTER VACCINE (HZV),RECOMBINANT,ADJUVANTED(IM) 11/09/2020, 05/15/2021    TdaP 07/16/2011, 06/17/2022              Health Maintenance   Topic Date Due    Colon Cancer Screening  10/14/2021    Retinal Eye Exam  11/21/2022    Hemoglobin A1c  12/27/2022    Foot Exam  05/09/2023    Urine Albumin/Creatinine Ratio  05/09/2023    Serum Creatinine Monitoring  09/26/2023    Potassium Monitoring  09/26/2023    Mammogram Start Age 34  10/28/2023    DTaP/Tdap/Td Vaccines (3 - Td or Tdap) 06/17/2032    Pneumococcal Vaccine 0-64  Completed  Hepatitis C Screen  Completed    COVID-19 Vaccine  Completed    Influenza Vaccine  Completed    Zoster Vaccines  Completed         I have reviewed and (if needed) updated the patient's problem list, medications, allergies, past medical and surgical history, social and family history ]     Review of Systems:  As per HPI   Answers submitted by the patient for this visit:  Ear Pain Questionnaire (Submitted on 12/16/2022)  Chief Complaint: Ear pain  Affected ear: left  Chronicity: new  Onset: 1 to 4 weeks ago  Progression since onset: waxing and waning  Frequency: every few hours  Fever: no fever  Pain - numeric: 8/10  headaches: Yes  hearing loss: Yes  neck pain: Yes     Physical Exam:  Vital Signs:      Vitals:     12/19/22 1437   BP: 102/62   Pulse: 84   Resp: 16   Temp: 36.7 ??C (98.1 ??F)   SpO2: 97%      Height: 163 cm (5' 4.17)    Body mass index is 35.75 kg/m??.      Wt Readings from Last 3 Encounters:   12/19/22 95 kg (209 lb 6.4 oz)   12/17/22 92.4 kg (203 lb 9.6 oz)   11/21/22 94.5 kg (208 lb 6.4 oz)      No LMP recorded. Patient has had a hysterectomy.     General: Well nourished, well developed, no acute distress. See BMI.  Head: Normocephalic, Atraumatic  Ears: External ears normal, Canals clear, TMs with dull [facility] CMS fols Very knowledgeable.   Mouth mucus membranes moist, posterior oropharynx without erythema or exudates  Neck: Supple, normal ROM, no thyromegaly.  Lymph Nodes: No cervical or supraclavicular lymphadenopathy.  Skin: No rashes or obvious concerning lesions on exposed skin.  Cardiovascular: RRR, normal S1/S2, no murmurs, rubs or gallops. No chest wall TTP.  Lungs: CTA bilaterally, without crackles/wheezes/rhonchi, good air movement, no increased WOB  GU - large boil left posterior perineum - some fluctuance. - see note from Vernell Leep  Extremities: No edema, pulses 2 +  PT   Musculoskeletal: Normal tone UEs/LEs  Neurologic: CNs 2-12 grossly intact, balance normal. Gross motor/fine motor normal.  Psychiatry: Pleasant affect, well kept, no abnormalities.     Imaging Studies:  No imaging ordered this visit.     Labs:  Recent Results   No results found for this or any previous visit (from the past 672 hour(s)).        Assessment & Plan: Pathophysiology of condition(s) reviewed with patient if applicable. Pt stated understanding and there were no barriers to learning.  SEE PATIENT INSTRUCTIONS BELOW FOR DISCUSSION/PLAN      Diagnoses and all orders for this visit:    Encounter for incision and drainage procedure  -     Discontinue: lidocaine-EPINEPHrine (XYLOCAINE W/EPI) 2 %-1:100,000 injection 1 mL  -     INCISION AND DRAINAGE; Future  -     Discontinue: lidocaine-EPINEPHrine (XYLOCAINE W/EPI) 2 %-1:100,000 injection 3 mL  -     lidocaine-EPINEPHrine (PF) (XYLOCAINE W/EPI) 2 %-1:200,000 injection 3 mL    Abscess of left buttock  -     lidocaine-EPINEPHrine (PF) (XYLOCAINE W/EPI) 2 %-1:200,000 injection 3 mL    Hematoma of left buttock  -     lidocaine-EPINEPHrine (PF) (XYLOCAINE W/EPI) 2 %-1:200,000 injection 3 mL    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 You may press option 1, then option 2 to schedule your procedure. You may also choose a Gastroenterologist provider by name if you have someone you prefer. If you have any further questions or concerns, feel free to reach out to [place] at [facility].        Thank you for choosing [facility] for your care!     If you have a question about a recent visit or a plan that was discussed with your provider in the past: Please go to [email or web address] (or your phone app) and sign in to your [facility] Chart to send this information. I do my best to respond to messages within 3 business days, but it is not always possible to provide the most timely and complete information. For more urgent questions, CALLING THE OFFICE is always the best option.     If you are experiencing any new or worsening symptoms or you would like to talk about changing a medication or medical plan; it is best to schedule an appointment: Please call our clinic at [phone] to schedule an appointment with your provider or the Acute Care Clinic or you can access the [facility] at [phone]. If you cannot decide if your issue warrants a visit, you can always ask to discuss this with one of our clinical care team.     If you have urgent healthcare needs after normal business hours, on weekends, or during holidays:  We have an Acute Care Clinic available by appointment. Call [phone] to schedule this. We offer care for health issues that do not require the emergency room.   Monday - Thursday (8am to 7pm)  Friday (8am to 5pm)   Saturdays (9am to 1pm)   You can always reach the [facility] 24/7 Nursing Line after hours to get nurse advice. The nurse can consult with the doctor on call if indicated. Just call our main office number and follow the prompts - [phone] You may receive a patient satisfaction survey by mail, text or email regarding your visit today. Your opinion is important to me. Your response benefits [place] all :-)    If you had or will have testing done, test results will be posted on [facility] or sent via letter.  A member of our Care Team will also contact you by message or phone if your results require follow-up.Please note that specialty test results take longer. Lab results may post before I review them. We will follow up as needed when I review them.     Lorel Monaco. Shanyce Daris, MD -   [facility]  [address], [place] [address]  Phone: [phone] Fax: [phone]  [email or web address]         No follow-ups on file.    Lorel Monaco.Cathey Endow, MD  Family Physician     [facility]  [address]  [place], [place] [address]  Ph [phone]  Fax [phone]

## 2022-12-21 ENCOUNTER — Ambulatory Visit: Admit: 2022-12-21 | Discharge: 2022-12-22 | Payer: PRIVATE HEALTH INSURANCE

## 2022-12-21 DIAGNOSIS — K7581 Nonalcoholic steatohepatitis (NASH): Principal | ICD-10-CM

## 2022-12-21 DIAGNOSIS — L0291 Cutaneous abscess, unspecified: Principal | ICD-10-CM

## 2022-12-21 DIAGNOSIS — B351 Tinea unguium: Principal | ICD-10-CM

## 2022-12-21 DIAGNOSIS — K7460 Unspecified cirrhosis of liver: Principal | ICD-10-CM

## 2022-12-21 MED ORDER — SULFAMETHOXAZOLE 800 MG-TRIMETHOPRIM 160 MG TABLET
ORAL_TABLET | Freq: Two times a day (BID) | ORAL | 0 refills | 5 days | Status: CP
Start: 2022-12-21 — End: 2022-12-26

## 2022-12-21 MED ORDER — CICLOPIROX 8 % TOPICAL SOLUTION
Freq: Every evening | TOPICAL | 11 refills | 0 days | Status: CP
Start: 2022-12-21 — End: 2023-03-21

## 2022-12-21 MED ORDER — CARVEDILOL 6.25 MG TABLET
ORAL_TABLET | Freq: Two times a day (BID) | ORAL | 3 refills | 0 days
Start: 2022-12-21 — End: ?

## 2022-12-21 NOTE — Unmapped (Signed)
Assessment and Plan:     Denise York was seen today for ac follow up .    Diagnoses and all orders for this visit:    Abscess  -     sulfamethoxazole-trimethoprim (BACTRIM DS) 800-160 mg per tablet; Take 1 tablet (160 mg of trimethoprim total) by mouth every twelve (12) hours for 5 days.    Pt is to follow up in clinic if bleeding/discharge/increase in pain/any redness     We discussed return and emergency precautions and when to present to ED or seek sooner follow up visit for above conditions. Patient/caregiver expressed understanding.      Subjective:     HPI: Denise York is a 57 y.o. female here for follow up from I&D performed 2 days ago in clinic; pt still has some discomfort when seated, otherwise no pain; very little bleeding on pad per pt. She states that the bump is still hard.    Denies: Fever/chills, redness, swelling, discharge, foul odor, heat to touch      I have reviewed past medical, surgical, medications, allergies, social and family histories today and updated them in Epic where appropriate.      ROS:     Review of systems negative unless otherwise noted as per HPI.     Objective:     Vitals:    12/21/22 1104   BP: 90/60   Pulse: 76   Resp: 16   Temp: 36.8 ??C (98.2 ??F)   SpO2: 99%     Body mass index is 35.85 kg/m??.    SKIN: Resolving abscess, clean incision site with scant blood on pad, no bleeding or discharge from incision site; induration still present; resolving hematoma; continued mild TTP of indurated region of abscess; no surrounding erythema/heat to touch/foul odor noted    Vernell Leep, PA

## 2022-12-22 ENCOUNTER — Ambulatory Visit: Admit: 2022-12-22 | Discharge: 2022-12-23 | Payer: PRIVATE HEALTH INSURANCE

## 2022-12-22 MED ADMIN — iohexol (OMNIPAQUE) 350 mg iodine/mL solution 100 mL: 100 mL | INTRAVENOUS | @ 20:00:00 | Stop: 2022-12-22

## 2022-12-22 NOTE — Unmapped (Signed)
Error see encounter under julie rikkers schedule.

## 2022-12-22 NOTE — Unmapped (Signed)
-----   Message from Vernell Leep, [place] sent at 12/21/2022 11:30 AM EDT -----  Regarding: re: Abx + Toe medication  Hi Dr. Cathey Endow,  I just saw Brenesha for follow up today, she's doing well. She mentioned that she never got the antibiotic from you that you were meant to prescribe that day - I will take care of that today.    She also mentioned that there is a medication for her toes that you were going to prescribe, I told her to write you via MyChart about that.    Raynelle Fanning

## 2022-12-23 MED ORDER — CARVEDILOL 6.25 MG TABLET
ORAL_TABLET | Freq: Two times a day (BID) | ORAL | 3 refills | 90 days | Status: CP
Start: 2022-12-23 — End: ?

## 2022-12-23 NOTE — Unmapped (Signed)
The patient is requesting a medication refill

## 2022-12-23 NOTE — Unmapped (Signed)
Prior authorization paperwork for ciclopirox (PENLAC) 8 % solution  submitted to [facility] via EPIC. Awaiting response.

## 2022-12-25 NOTE — Unmapped (Signed)
Called patient to check-in regarding the ciclopirox solution medication. Patient states that she was able to pick that medication up from her pharmacy. No further action required.

## 2023-01-02 NOTE — Unmapped (Signed)
EGD  Procedure #1   Colonoscopy  Procedure #2   [id number]  MRN   HEPATOLOGIST  Endoscopist     Is the patient's health insurance [address], [place] [employer] [facility]), or [employer]?     Urgent procedure     Are you pregnant?     Are you in the process of scheduling or awaiting results of a heart ultrasound, stress test, or catheterization to evaluate new or worsening chest pain, dizziness, or shortness of breath?     Do you take: Plavix (clopidogrel), Coumadin (warfarin), Lovenox (enoxaparin), Pradaxa (dabigatran), Effient (prasugrel), Xarelto (rivaroxaban), Eliquis (apixaban), Pletal (cilostazol), or Brilinta (ticagrelor)?          Which of the above medications are you taking?          What is the name of the medical practice that manages this medication?          What is the name of the medical provider who manages this medication?     Do you have hemophilia, von Willebrand disease, or low platelets?     Do you have a pacemaker or implanted cardiac defibrillator?     Has a [facility] provider specified the location(s)?     Which location(s) did the [facility] provider specify?        [facility]        [place]        [facility]of[facility]        [facility]-Mod Sedation     Is procedure indication for variceal banding (this does NOT include variceal screening)?     Have you had a heart attack, stroke or heart stent placement within the past 6 months?     Month of event     Year of event (ONLY ENTER LAST 2 DIGITS)        5  Height (feet)   5  Height (inches)   205  Weight (pounds)   34.1  BMI          Did the ordering provider specify a bowel prep?          What bowel prep was specified?     Do you have chronic kidney disease?   TRUE  Do you have chronic constipation or have you had poor quality bowel preps for past colonoscopies?     Do you have Crohn's disease or ulcerative colitis?     Have you had weight loss surgery?        TRUE  When you walk around your house or grocery store, do you have to stop and rest due to shortness of breath, chest pain, or light-headedness?     Do you ever use supplemental oxygen?   TRUE  Have you been hospitalized for cirrhosis of the liver or heart failure in the last 12 months?     Have you been treated for mouth or throat cancer with radiation or surgery?     Have you been told that it is difficult for doctors to insert a breathing tube in you during anesthesia?     Have you had a heart or lung transplant?          Are you on dialysis?   TRUE  Do you have cirrhosis of the liver?     Do you have myasthenia gravis?     Is the patient a prisoner?        TRUE  Have you been diagnosed with sleep apnea or do you wear a  CPAP machine at night?     Are you younger than 30?   TRUE  Have you previously received propofol sedation administered by an anesthesiologist for a GI procedure?     Do you drink an average of more than 3 drinks of alcohol per day?     Do you regularly take suboxone or any prescription medications for chronic pain?     Do you regularly take Ativan, Klonopin, Xanax, Valium, lorazepam, clonazepam, alprazolam, or diazepam?     Have you previously had difficulty with sedation during a GI procedure?     Have you been diagnosed with PTSD?     Are you allergic to fentanyl or midazolam (Versed)?     Do you take medications for HIV?   ################# ## ###################################################################################################################   MRN:          [id number]   Anticoag Review:  No   Nurse Triage:  No   GI Clinic Consult:  No   Procedure(s):  EGD     Colonoscopy   [facility]                  Endoscopist:  HEPATOLOGIST   Urgent:            No   Prep:               Extended Bowel Prep-Prep Pool                  ################# ## ###################################################################################################################

## 2023-01-08 MED ORDER — PEG 3350-ELECTROLYTES 236 GRAM-22.74 GRAM-6.74 GRAM-5.86 GRAM SOLUTION
Freq: Once | ORAL | 0 refills | 1 days | Status: CP
Start: 2023-01-08 — End: 2023-01-08

## 2023-01-14 NOTE — Unmapped (Signed)
Patient needs a form filled out monthly to continue her short term disability payments. Patient dropped off several months worth the forms last month and we agreed she would notify me each month before the form was due as a reminder to complete the form. Patient did not reach out this month so I contacted her to check that we needed to complete form. Patient does need form. I reminded her again to please call or send a mychart message each month to tell us to complete the short term disability form.

## 2023-01-15 ENCOUNTER — Encounter
Admit: 2023-01-15 | Discharge: 2023-01-15 | Payer: PRIVATE HEALTH INSURANCE | Attending: Anesthesiology | Primary: Anesthesiology

## 2023-01-15 ENCOUNTER — Ambulatory Visit: Admit: 2023-01-15 | Discharge: 2023-01-15 | Payer: PRIVATE HEALTH INSURANCE

## 2023-01-15 MED ORDER — OMEPRAZOLE 20 MG CAPSULE,DELAYED RELEASE
ORAL_CAPSULE | Freq: Two times a day (BID) | ORAL | 0 refills | 56.00000 days | Status: CP
Start: 2023-01-15 — End: 2023-03-12

## 2023-01-15 MED ADMIN — lidocaine (PF) (XYLOCAINE-MPF) 20 mg/mL (2 %) injection: INTRAVENOUS | @ 16:00:00 | Stop: 2023-01-15

## 2023-01-15 MED ADMIN — lactated Ringers infusion: 10 mL/h | INTRAVENOUS | @ 16:00:00 | Stop: 2023-01-15

## 2023-01-15 MED ADMIN — ondansetron (ZOFRAN) injection: INTRAVENOUS | @ 16:00:00 | Stop: 2023-01-15

## 2023-01-15 MED ADMIN — ePHEDrine (PF) 25 mg/5 mL (5 mg/mL) in 0.9% sodium chloride syringe Syrg: INTRAVENOUS | @ 16:00:00 | Stop: 2023-01-15

## 2023-01-15 MED ADMIN — fentaNYL (PF) (SUBLIMAZE) injection: INTRAVENOUS | @ 16:00:00 | Stop: 2023-01-15

## 2023-01-15 MED ADMIN — phenylephrine 1 mg/10 mL (100 mcg/mL) injection Syrg: INTRAVENOUS | @ 16:00:00 | Stop: 2023-01-15

## 2023-01-15 MED ADMIN — Propofol (DIPRIVAN) injection: INTRAVENOUS | @ 16:00:00 | Stop: 2023-01-15

## 2023-01-15 MED ADMIN — propofol (DIPRIVAN) infusion 10 mg/mL: INTRAVENOUS | @ 16:00:00 | Stop: 2023-01-15

## 2023-01-16 DIAGNOSIS — K7682 Hepatic encephalopathy (CMS-HCC): Principal | ICD-10-CM

## 2023-01-16 NOTE — Unmapped (Signed)
EGD with grade 1 varices, PHG, and gastric ulcers. On Coreg 6.25 mg twice daily for primary prophylaxis.    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

## 2023-02-04 NOTE — Unmapped (Signed)
Denise York called back. She states that the short-term disability for patient is 12 months. The patient is eligible to receive short-term disability until 10/01/23. At that time, long-term disability eligibility for the patient will be determined. [name] stated that this was unfortunately a state rule.

## 2023-02-04 NOTE — Unmapped (Signed)
Per Dr. Ovidio Kin request, contacted the patients disability consultant regarding her Short Term disability forms that have been requested to be sent out at the beginning of each month. Dr. Cathey Endow has the following questions for the consultant, Deeann Saint:    Why is the patient still requiring short term disability paperwork?  Due to it being 3+ months, should this not now be considered long-term disability?    Left a voicemail for Deeann Saint to call us back, and to ask for [facility]. Awaiting call back.

## 2023-02-11 ENCOUNTER — Ambulatory Visit
Admit: 2023-02-11 | Discharge: 2023-02-12 | Payer: PRIVATE HEALTH INSURANCE | Attending: Diagnostic Radiology | Primary: Diagnostic Radiology

## 2023-02-11 DIAGNOSIS — K7682 Hepatic encephalopathy (CMS-HCC): Principal | ICD-10-CM

## 2023-02-11 NOTE — Unmapped (Addendum)
Patient Name: Denise York  Patient Age: 57 y.o.  Encounter Date: 02/11/23   Attending Interventional Radiologist: Dr. Ammie Dalton  Resident Interventional Radiologist: Wonda Amis, MD    Referring Physician: Dorene Sorrow, PA  [address]  [place],  [place] [address]  Primary Care Provider: Cathey Endow Heywood Footman, MD      SUBJECTIVE      Reason for Visit:  Portosytemic Shunt Embolization        History of Present Illness: Denise York is a 57 y.o. female who is seen in consultation at the request of Dorene Sorrow for evaluation of possible embolization of portosystemic shunt (recanalized paraumbilical vein) in the setting of liver cirrhosis secondary to NASH, complicated by clinically significant portal hypertension and suspected hepatic encephalopathy. She recently had EGD done on 01/15/23 showing grade I esophageal varices, superficial gastric ulcers, and portal hypertensive gastropathy.     1 prior remote episode of hematemesis in the past which did not require hospitalization or transfusion.    Significant HE worsening over the last 6 months and limiting QOL. Unable to drive or work with symptoms. HE note improved with medical management.      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    Obesity     Prediabetes        Past Surgical History:  Past Surgical History:   Procedure Laterality Date    ABDOMINAL WALL MESH  REMOVAL  1997, 2005    Placement, 1997, removal and replacement 2005    BLADDER SURGERY      bladder tact X2    BREAST BIOPSY Right 04/2013    benign    BREAST EXCISIONAL BIOPSY Right 05/2013    benign    BREAST SURGERY Right     precancerous spot removed CHOLECYSTECTOMY      HYSTERECTOMY      PR COLSC FLX W/RMVL OF TUMOR POLYP LESION SNARE TQ N/A 10/14/2016    Procedure: COLONOSCOPY FLEX; W/REMOV TUMOR/LES BY SNARE;  Surgeon: Alfred Levins, MD;  Location: [facility];  Service: Gastroenterology    PR COLSC FLX W/RMVL OF TUMOR POLYP LESION SNARE TQ N/A 01/15/2023    Procedure: COLONOSCOPY FLEX; W/REMOV TUMOR/LES BY SNARE;  Surgeon: Mickie Hillier, MD;  [facility];  Service: Gastroenterology    PR PERC CLOS,CONG INTERATRIAL COMMUN W/IMPL N/A 09/12/2022    Procedure: ASD closure;  Surgeon: Jacquelyne Balint, MD;  Location: [facility] C[facility];  Service: Cardiology    PR UPPER GI ENDOSCOPY,BIOPSY N/A 01/15/2023    Procedure: UGI ENDOSCOPY; WITH BIOPSY, SINGLE OR MULTIPLE;  Surgeon: Mickie Hillier, MD;  [facility]: [facility];  Service: Gastroenterology    SKIN BIOPSY         Family History:  Family History   Problem Relation Age of Onset    Heart disease Mother     Hypertension Mother     Heart disease Father     Kidney disease Father     Allergies Father     Gout Father  Allergies Other         FAMILY H/O    Coronary artery disease Other         FAMILY H/O    Kidney failure Other         FAMILY H/O    No Known Problems Sister     No Known Problems Daughter     No Known Problems Maternal Grandmother     No Known Problems Maternal Grandfather     No Known Problems Paternal Grandmother     No Known Problems Paternal Grandfather     BRCA 1/2 Neg Hx     Breast cancer Neg Hx     Cancer Neg Hx     Colon cancer Neg Hx     Endometrial cancer Neg Hx     Ovarian cancer Neg Hx     Substance Abuse Disorder Neg Hx     Mental illness Neg Hx         Allergies:  Allergies   Allergen Reactions    Penicillins Hives    Shellfish Containing Products Anaphylaxis    Glipizide Headache    Lisinopril Cough        Anticoagulant/Antiplatelet Medications: ASA81    OBJECTIVE     Physical Exam:  Vitals:    02/11/23 0942   BP: 125/79   Pulse: 81   Resp: 16 Temp: 36.4 ??C (97.6 ??F)   SpO2: 99%     There is no height or weight on file to calculate BMI.    General: alert, in no acute distress  HEENT: normocephalic, atraumatic, anicteric sclera  Neck: no adenopathy  Skin: grossly normal exam  Neurologic: Mild asterixis, oriented to person, place, and time but with delayed thought processing and speech.     Point-of-care US performed: Not performed    Pertinent Laboratory Values:  Lab Results   Component Value Date    WBC 7.3 09/26/2022    HGB 14.1 09/26/2022    HCT 40.4 09/26/2022    PLT 82 (L) 09/26/2022     Lab Results   Component Value Date    AST 67 (H) 09/26/2022    ALT 38 09/26/2022    ALKPHOS 85 09/26/2022     No results found for: PTINR, APTT  No results found for: AFP    02/11/2023, labs were reviewed by me personally. Interpretation: Stable thrombocytopenia,mild AST elevation, and based on labs recommend New INR, CMP and CBC, within 30 days of procedure, per divisional guidelines..    Pertinent Imaging Studies: 02/11/2023, imaging was visualized and reviewed by me personally. Interpretation: Small splenorenal shunt, moderate paraumbilical vein shunt, and based on imaging recommend embolization of portosystemic paraumbilical vein shunt.    Amount and/or Complexity of Data: High : (Must meet at all three listed below)  Review of the result(s) of each unique test ( at least 3 labs CBC, CMP, INR)  Review of prior external note(s) from each unique source  Independent interpretation of a test performed by another physician/other qualified health care professional (not separately reported)    ASA Score: ASA 3 - Patient with moderate systemic disease with functional limitations    Performance Status: 1 = Restricted in physically strenuous activity but ambulatory and able to carry out work of a light or sedentary nature, e.g., light house work, office work    ASSESSMENT/PLAN     Denise York is a 57 y.o. female with history of liver cirrhosis who presents to VIR clinic for evaluation of  portosystemic (paraumbilical vein) shunt embolization.     I discussed the various treatment options available. At this time, we feel that she would benefit most from percutaneous  portosystemic shunt embolization  The risks, benefits and alternatives were fully discussed including bleeding, infection, non-target embolization , access site bleeding, post-operative pain , liver dysfunction , worsening of gastric/esophageal varices and damage to adjacent structures/organs. The patient's questions were all answered to her satisfaction.       -- Procedure will be scheduled at the earliest mutually available date & time  -- Anticoagulant medication hold required: ASA 81 mg, hold 3 days prior   -- Anticoagulation can be resumed: 24 hours after procedure  -- Antibiotics required: Yes- surgical prophylaxis    -- Discharge medications:No N/A  --Planned level of sedation: Moderate sedation  --Planned access:  abdomen (percutaneous), Positioning: supine  --Labs needed: Yes-per divisional guidelines, the patient needs new INR, CMP and CBC, 30 days before the procedure  --Pre-procedural medications required: 1 gram of ceftriaxone for surgical prophylaxis.  --Pre-US needed: Yes-Eval percutaneous shunt access  -- Spoke with No one.                                              -- Notes reviewed:Yes- progress note of Stan Head stating, patient's history of liver cirrhosis and hepatic encephalopathy.    --Follow up-  Hepatology follow-up    Informed consent obtained:   Yes---This procedure has been fully reviewed with the patient/patient's authorized representative. The risks, benefits and alternatives have been explained, and the patient/patient's authorized representative has consented to the procedure.   --The patient will accept blood products in an emergent situation.   --The patient does not have a Do Not Resuscitate order in effect.    Patient seen and discussed with Dr. Ammie Dalton, who agrees with the above assessment and plan. Thank you for involving [place] in the care of this patient.    Charolett Bumpers, MS4    Odelia Gage, MD  [place] [facility], PGY-6    I have seen this patient and discussed their management with the fellow and with the patient. I agree with the recommendations outlined above. Percutaneous embolization of the recanalized periumbilican vein under moderate sedation. The access will be achieved in a retrograde fashion toward the inflow from the liver. Coils and amplaz vascular plugger will be used for embolization. After this procedure, portal venous pressure will be increased, causing portal-hypertension related complications, such as variceal bleed and ascites. These are extensively explained to the patient and she agrees to proceed.     Ammie Dalton, MD  Professor of Radiology  Vascular and Interventional Radiology  [facility] of Medicine, [facility]

## 2023-02-11 NOTE — Unmapped (Signed)
.  virconsultation  Patient in clinic today to have a consultation with  VIR providers Drs Yu/Wood MD/APP. VSS. Alert and Oriented x4. Allergies and home medications reviewed and documented. Consent available at bedside if patient chooses to go forward with procedure presented by VIR provider MD/APP, consent will be verified and signed by RN, scanned to patient chart. RN provided education and reading materials to patient. No further questions or concerns at this time. RN offered clinic number for patient contact.   AVS offered.

## 2023-02-12 NOTE — Unmapped (Addendum)
VIR pre procedure prep call completed. Reviewed to [facility] then [facility].  Informed of no show/late cancellation policy. NPO guidelines reviewed. Pt OK to take sips of clear liquids with all AM meds.  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 Made aware of visitation policy. Pt verbalized understanding. All questions answered.     Pt will start holding off aspirin from tomorrow (8/8) and will continue to hold prior to procedure.

## 2023-02-14 DIAGNOSIS — K7460 Unspecified cirrhosis of liver: Principal | ICD-10-CM

## 2023-02-14 DIAGNOSIS — K7581 Nonalcoholic steatohepatitis (NASH): Principal | ICD-10-CM

## 2023-02-14 MED ORDER — CARVEDILOL 6.25 MG TABLET
ORAL_TABLET | Freq: Two times a day (BID) | ORAL | 3 refills | 90 days | Status: CP
Start: 2023-02-14 — End: ?
  Filled 2023-02-20: qty 180, 90d supply, fill #0

## 2023-02-17 ENCOUNTER — Ambulatory Visit: Admit: 2023-02-17 | Discharge: 2023-02-17 | Payer: PRIVATE HEALTH INSURANCE

## 2023-02-17 DIAGNOSIS — K7682 Hepatic encephalopathy (CMS-HCC): Principal | ICD-10-CM

## 2023-02-17 LAB — COMPREHENSIVE METABOLIC PANEL
ALBUMIN: 3.2 g/dL — ABNORMAL LOW (ref 3.4–5.0)
ALKALINE PHOSPHATASE: 91 U/L (ref 46–116)
ALT (SGPT): 52 U/L — ABNORMAL HIGH (ref 10–49)
ANION GAP: 4 mmol/L — ABNORMAL LOW (ref 5–14)
AST (SGOT): 70 U/L — ABNORMAL HIGH (ref <=34)
BILIRUBIN TOTAL: 2.5 mg/dL — ABNORMAL HIGH (ref 0.3–1.2)
BLOOD UREA NITROGEN: 14 mg/dL (ref 9–23)
BUN / CREAT RATIO: 12
CALCIUM: 9.9 mg/dL (ref 8.7–10.4)
CHLORIDE: 102 mmol/L (ref 98–107)
CO2: 28 mmol/L (ref 20.0–31.0)
CREATININE: 1.15 mg/dL — ABNORMAL HIGH
EGFR CKD-EPI (2021) FEMALE: 56 mL/min/{1.73_m2} — ABNORMAL LOW (ref >=60)
GLUCOSE RANDOM: 160 mg/dL — ABNORMAL HIGH (ref 70–99)
POTASSIUM: 4.3 mmol/L (ref 3.4–4.8)
PROTEIN TOTAL: 7.2 g/dL (ref 5.7–8.2)
SODIUM: 134 mmol/L — ABNORMAL LOW (ref 135–145)

## 2023-02-17 LAB — CBC W/ AUTO DIFF
BASOPHILS ABSOLUTE COUNT: 0 10*9/L (ref 0.0–0.1)
BASOPHILS RELATIVE PERCENT: 0.8 %
EOSINOPHILS ABSOLUTE COUNT: 0.1 10*9/L (ref 0.0–0.5)
EOSINOPHILS RELATIVE PERCENT: 2.6 %
HEMATOCRIT: 37 % (ref 34.0–44.0)
HEMOGLOBIN: 13.2 g/dL (ref 11.3–14.9)
LYMPHOCYTES ABSOLUTE COUNT: 1.5 10*9/L (ref 1.1–3.6)
LYMPHOCYTES RELATIVE PERCENT: 27.2 %
MEAN CORPUSCULAR HEMOGLOBIN CONC: 35.6 g/dL (ref 32.0–36.0)
MEAN CORPUSCULAR HEMOGLOBIN: 32 pg (ref 25.9–32.4)
MEAN CORPUSCULAR VOLUME: 90 fL (ref 77.6–95.7)
MEAN PLATELET VOLUME: 10.4 fL (ref 6.8–10.7)
MONOCYTES ABSOLUTE COUNT: 0.5 10*9/L (ref 0.3–0.8)
MONOCYTES RELATIVE PERCENT: 9.8 %
NEUTROPHILS ABSOLUTE COUNT: 3.2 10*9/L (ref 1.8–7.8)
NEUTROPHILS RELATIVE PERCENT: 59.6 %
PLATELET COUNT: 47 10*9/L — ABNORMAL LOW (ref 150–450)
RED BLOOD CELL COUNT: 4.11 10*12/L (ref 3.95–5.13)
RED CELL DISTRIBUTION WIDTH: 15.6 % — ABNORMAL HIGH (ref 12.2–15.2)
WBC ADJUSTED: 5.4 10*9/L (ref 3.6–11.2)

## 2023-02-17 LAB — PROTIME-INR
INR: 1.19
PROTIME: 13.3 s — ABNORMAL HIGH (ref 9.9–12.6)

## 2023-02-17 MED ADMIN — lidocaine (PF) (XYLOCAINE-MPF) 10 mg/mL (1 %) injection: INTRADERMAL | @ 18:00:00 | Stop: 2023-02-17

## 2023-02-17 MED ADMIN — iohexol (OMNIPAQUE) 300 mg iodine/mL solution 25 mL: 25 mL | INTRAVENOUS | @ 19:00:00 | Stop: 2023-02-17

## 2023-02-17 MED ADMIN — fentaNYL (PF) (SUBLIMAZE) injection: INTRAVENOUS | @ 18:00:00 | Stop: 2023-02-17

## 2023-02-17 MED ADMIN — sodium chloride (NS) 0.9 % infusion: INTRAVENOUS | @ 18:00:00 | Stop: 2023-02-17

## 2023-02-17 MED ADMIN — midazolam (VERSED) injection: INTRAVENOUS | @ 18:00:00 | Stop: 2023-02-17

## 2023-02-17 NOTE — Unmapped (Signed)
Discharge instructions reviewed and given to pt and family. Understanding verbalized. PIV removed with tip intact. Home supplies given for dressing changes. Pt ambulatory without assistance. Discharged from PRU in stable condition.

## 2023-02-17 NOTE — Unmapped (Cosign Needed)
[place] INTERVENTIONAL RADIOLOGY - Pre Procedure H/P      Assessment/Plan:    Denise York is a 57 y.o. female who will undergo portosystemic shunt embolization in Interventional Radiology.    --This procedure has been fully reviewed with the patient/patient???s authorized representative. The risks, benefits and alternatives have been explained, and the patient/patient???s authorized representative has consented to the procedure.  --The patient will accept blood products in an emergent situation.  --The patient does not have a Do Not Resuscitate order in effect.    HPI: Denise York is a 57 y.o. female with HE who presents for embolization of a recanalized paraumbilical vein.    Allergies:   Allergies   Allergen Reactions    Penicillins Hives    Shellfish Containing Products Anaphylaxis    Glipizide Headache    Lisinopril Cough     Medications:  No relevant medications, please see full medication list in Epic.    ASA Grade: ASA 2 - Patient with mild systemic disease with no functional limitations    PE:    Vitals:    02/17/23 0943   BP: 140/86   Pulse: 79   Resp: 14   Temp: 36 ??C (96.8 ??F)   SpO2: 97%     General: female in NAD.  Airway assessment: Class 2 - Can visualize soft palate and fauces, tip of uvula is obscured  Lungs: Respirations nonlabored     Everrett Coombe, MD  VIR PGY-6

## 2023-02-17 NOTE — Unmapped (Signed)
[place] INTERVENTIONAL RADIOLOGY - Operative Note     VIR Post-Procedure Note    Procedure Name: paraumbilical vein embolization    Pre-Op Diagnosis: portosystemic shunt    Post-Op Diagnosis: Same as pre-operative diagnosis    VIR Providers  Attending: Dr. Ammie Dalton  Fellow: Dr. Everrett Coombe    Time out: Prior to the procedure, a time out was performed with all team members present. During the time out, the patient, procedure and procedure site when applicable were verbally verified by the team members and Dr. Cathie Hoops.    Description of procedure: Successful coil embolization of a recanalized paraumbilical vein. Gelfoam tract embolization.    Sedation:Moderate sedation    Estimated Blood Loss: approximately <5 mL  Complications: None    See detailed procedure note with images in PACS.    The patient tolerated the procedure well without incident or complication and left the room in stable condition.    Plan:  - Bedrest for 1 hour  - Follow up with hepatology    Everrett Coombe, MD  02/17/2023 2:19 PM

## 2023-02-19 NOTE — Unmapped (Signed)
[facility] Refill Coordination Note    Specialty Lite Medication(s) to be Shipped:   Xifaxan    Other medication(s) to be shipped:  carvedilol     Denise York, DOB: September 16, 1965  Phone: [phone] (home)       All above HIPAA information was verified with patient.     Was a Nurse, learning disability used for this call? No    Changes to medications: Denise York reports no changes at this time.  Changes to insurance: No      REFERRAL TO PHARMACIST     Referral to the pharmacist: Not needed      [facility]     Shipping address confirmed in Epic.     Delivery Scheduled: Yes, Expected medication delivery date: 02/21/2023.     Medication will be delivered via UPS to the prescription address in Epic WAM.    Kerby Less   [facility] Specialty Technician

## 2023-02-20 MED FILL — XIFAXAN 550 MG TABLET: ORAL | 90 days supply | Qty: 180 | Fill #2

## 2023-03-09 MED ORDER — OMEPRAZOLE 20 MG CAPSULE,DELAYED RELEASE
ORAL_CAPSULE | Freq: Every day | ORAL | 0 refills | 0 days
Start: 2023-03-09 — End: ?

## 2023-03-27 ENCOUNTER — Ambulatory Visit: Admit: 2023-03-27 | Discharge: 2023-03-28 | Payer: PRIVATE HEALTH INSURANCE

## 2023-03-27 DIAGNOSIS — K7469 Other cirrhosis of liver: Principal | ICD-10-CM

## 2023-03-27 LAB — COMPREHENSIVE METABOLIC PANEL
ALBUMIN: 3.1 g/dL — ABNORMAL LOW (ref 3.4–5.0)
ALKALINE PHOSPHATASE: 107 U/L (ref 46–116)
ALT (SGPT): 37 U/L (ref 10–49)
ANION GAP: 3 mmol/L — ABNORMAL LOW (ref 5–14)
AST (SGOT): 51 U/L — ABNORMAL HIGH (ref <=34)
BILIRUBIN TOTAL: 1.7 mg/dL — ABNORMAL HIGH (ref 0.3–1.2)
BLOOD UREA NITROGEN: 21 mg/dL (ref 9–23)
BUN / CREAT RATIO: 17
CALCIUM: 10 mg/dL (ref 8.7–10.4)
CHLORIDE: 105 mmol/L (ref 98–107)
CO2: 29 mmol/L (ref 20.0–31.0)
CREATININE: 1.26 mg/dL — ABNORMAL HIGH
EGFR CKD-EPI (2021) FEMALE: 50 mL/min/{1.73_m2} — ABNORMAL LOW (ref >=60)
GLUCOSE RANDOM: 114 mg/dL (ref 70–179)
POTASSIUM: 4.7 mmol/L (ref 3.5–5.1)
PROTEIN TOTAL: 6.7 g/dL (ref 5.7–8.2)
SODIUM: 137 mmol/L (ref 135–145)

## 2023-03-27 LAB — CBC
HEMATOCRIT: 36.6 % (ref 34.0–44.0)
HEMOGLOBIN: 12.7 g/dL (ref 11.3–14.9)
MEAN CORPUSCULAR HEMOGLOBIN CONC: 34.8 g/dL (ref 32.0–36.0)
MEAN CORPUSCULAR HEMOGLOBIN: 31 pg (ref 25.9–32.4)
MEAN CORPUSCULAR VOLUME: 89.1 fL (ref 77.6–95.7)
MEAN PLATELET VOLUME: 11.2 fL — ABNORMAL HIGH (ref 6.8–10.7)
PLATELET COUNT: 52 10*9/L — ABNORMAL LOW (ref 150–450)
RED BLOOD CELL COUNT: 4.11 10*12/L (ref 3.95–5.13)
RED CELL DISTRIBUTION WIDTH: 14.4 % (ref 12.2–15.2)
WBC ADJUSTED: 6.3 10*9/L (ref 3.6–11.2)

## 2023-03-27 LAB — AFP TUMOR MARKER: AFP-TUMOR MARKER: 6 ng/mL (ref <=8)

## 2023-03-27 LAB — PROTIME-INR
INR: 1.24
PROTIME: 13.8 s — ABNORMAL HIGH (ref 9.9–12.6)

## 2023-03-27 MED ORDER — ONDANSETRON 4 MG DISINTEGRATING TABLET
ORAL_TABLET | Freq: Three times a day (TID) | 0 refills | 30 days | Status: CP | PRN
Start: 2023-03-27 — End: 2023-04-26

## 2023-03-27 MED ORDER — CARVEDILOL 3.125 MG TABLET
ORAL_TABLET | Freq: Two times a day (BID) | ORAL | 3 refills | 90 days | Status: CP
Start: 2023-03-27 — End: 2024-03-26

## 2023-03-27 NOTE — Unmapped (Signed)
[facility]  03/27/2023    Reason for visit: Follow up of Other cirrhosis of liver (CMS-HCC) [K74.69]    Assessment/Plan:    57 year old female presenting for follow up of decompensated MASH cirrhosis.  Her cirrhosis has been complicated by clinically significant portal hypertension and hepatic encephalopathy.  Metabolic risk factors include obesity, type 2 diabetes mellitus, hypertension, and dyslipidemia. Hepatic encephalopathy improved following splenorenal shunt embolization but still present. She has not been taking lactulose due to dislike of taste and associated nausea. She does not have evidence of jaundice, ascites, or known history of esophageal variceal bleeding. MELD is too low for a realistic chance at a deceased donor liver transplant. Briefly discussed living donor liver transplant and she was advised to let us know if she identifies a donor.    Other cirrhosis (CMS-HCC)  MELD 15  Continue HCC surveillance every 6 months with ultrasound and AFP.  AFP normal today. [place] next due 05/2023.  Increase protein intake, goal 100 g/day. Discussed using protein supplements such as Glucerna.  Avoid NSAIDs.  Tylenol up to 2 g/day is okay.    MASH  Continue weight loss efforts.    Continue management of metabolic risk factors with PCP.    Hepatic encephalopathy (CMS-HCC)  Improved following splenorenal shunt embolization but still present.  Discussed need to resume lactulose, titrated to produce 3-5 soft bowel movements per day. Discussed mixing lactulose with Gatorade to help improve taste.  She notes nausea/vomiting due to taste from lactulose. Will try Zofran 20-30 minutes before lactulose use.  Continue Xifaxan twice daily.  She was advised not to drive at all due to risk of traffic accidents.    Portal hypertension (CMS-HCC)  Decrease carvedilol to 3.125 mg twice daily, given dizziness and hypotension.  EGD yearly (next due 01/2024).    Lower extremity edema  Given rising creatinine, decrease Lasix to 20 mg daily and decrease spironolactone to 50 mg daily.   Repeat BMP 1 week.  Limit sodium to 2000 mg daily.   Discussed use of compression stockings.    Nausea/vomiting  Patient reports improved nausea/vomiting on PPI therapy.  Now only reports N/V with lactulose, due to taste/thickness.      Vaccinations: We recommend that patients have vaccinations to prevent various infections that can occur, especially in the setting of having underlying liver disease. The following vaccinations should be given:  -Hepatitis A: Immunized.  -Hepatitis B: Immunized.   -Influenza (yearly): Due now, declined.  -Pneumococcal: Prevnar 20 administered 06/2022.  -Zoster: Shingrix series completed in 2022.  -SARS-CoV-2: Due now, declined.  -Tetanus: TdaP administered 06/2022.  -RSV: Recommended.    Return in about 3 months (around 06/26/2023).    Subjective   History of Present Illness   Accompanied by: friend    57 y.o. female with past medical history of type 2 diabetes mellitus, obesity (BMI 39.8), hypertension, dyslipidemia, lobular carcinoma in situ of breast, ASD repair 09/2022 and constipation presenting for follow up of cirrhosis. Her cirrhosis has been complicated by clinically significant portal hypertension and hepatic encephalopathy.  She does not have evidence of jaundice, ascites, or known history of esophageal variceal bleeding.     Interval History  Last visit 09/26/2022. No interim hospitalizations. 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 Notes some improvement in encephalopathy but still having memory difficulties and slowed speech. Underwent EGD 01/15/23, G1 EV and non bleeding gastric ulcers (H. Pylori negative). Nausea improved following PPI initiation. Had colonoscopy for colon cancer screening same day, one 4mm  benign polyp. Denies any recent constipation, reports 2 stools daily on average. She notes nausea/vomiting due to taste from lactulose and is not taking lactulose. Taking Xifaxan twice daily. Denies jaundice, abdominal pain or distention/ascites, melena, or hematochezia. Notes mild lower extremity edema, well-managed on lasix 40 mg daily and spironolactone 100 mg daily.     Objective   Physical Exam   Vital Signs: BP 112/68  - Pulse 78  - Temp 36.7 ??C (98 ??F) (Tympanic)  - Wt 91.7 kg (202 lb 1.6 oz)  - SpO2 100%  - BMI 33.63 kg/m??   Constitutional: She is in no apparent distress  Eyes: Anicteric sclerae  Cardiovascular: Mild peripheral edema bilaterally  Gastrointestinal: Soft, non-tender, non-distended abdomen with splenomegaly  Neurologic: Awake, alert, and oriented to person, place, and time with normal speech; mild asterixis    Results for orders placed or performed in visit on 03/27/23   CBC   Result Value Ref Range    WBC 6.3 3.6 - 11.2 10*9/L    RBC 4.11 3.95 - 5.13 10*12/L    HGB 12.7 11.3 - 14.9 g/dL    HCT 16.1 09.6 - 04.5 %    MCV 89.1 77.6 - 95.7 fL    MCH 31.0 25.9 - 32.4 pg    MCHC 34.8 32.0 - 36.0 g/dL    RDW 40.9 81.1 - 91.4 %    MPV 11.2 (H) 6.8 - 10.7 fL    Platelet 52 (L) 150 - 450 10*9/L   PT-INR   Result Value Ref Range    PT 13.8 (H) 9.9 - 12.6 sec    INR 1.24    Comprehensive Metabolic Panel   Result Value Ref Range    Sodium 137 135 - 145 mmol/L    Potassium 4.7 3.5 - 5.1 mmol/L    Chloride 105 98 - 107 mmol/L    CO2 29.0 20.0 - 31.0 mmol/L    Anion Gap 3 (L) 5 - 14 mmol/L    BUN 21 9 - 23 mg/dL    Creatinine 7.82 (H) 0.55 - 1.02 mg/dL    BUN/Creatinine Ratio 17     eGFR CKD-EPI (2021) Female 50 (L) >=60 mL/min/1.67m2    Glucose 114 70 - 179 mg/dL    Calcium 95.6 8.7 - 21.3 mg/dL    Albumin 3.1 (L) 3.4 - 5.0 g/dL    Total Protein 6.7 5.7 - 8.2 g/dL    Total Bilirubin 1.7 (H) 0.3 - 1.2 mg/dL    AST 51 (H) <=08 U/L    ALT 37 10 - 49 U/L    Alkaline Phosphatase 107 46 - 116 U/L   AFP tumor marker   Result Value Ref Range    AFP-Tumor Marker 6 <=8 ng/mL     MELD 3.0: 15 at 03/27/2023  3:34 PM  Calculated from:  Serum Creatinine: 1.26 mg/dL at 6/57/8469  6:29 PM  Serum Sodium: 137 mmol/L at 03/27/2023  3:34 PM  Total Bilirubin: 1.7 mg/dL at 12/03/4130  4:40 PM  Serum Albumin: 3.1 g/dL at 07/10/7251  6:64 PM  INR(ratio): 1.24 at 03/27/2023  3:34 PM  Age at listing (hypothetical): 56 years  Sex: Female at 03/27/2023  3:34 PM    I personally spent 45 minutes face-to-face and non-face-to-face in the care of this patient, which includes all pre, intra, and post visit time on the date of service.

## 2023-03-27 NOTE — Unmapped (Addendum)
[facility]  [facility]  Dorene Sorrow, [place]   [facility]  [address]  [place], [place] [address]  Main Clinic: [phone]  Appointment Schedulers: [phone]  Iver Nestle, RN: [phone]  Fax: [phone]       Thank you for allowing me to participate in your medical care today. Here are my recommendations based on today's visit:    Do not take carvedilol tonight.  If blood pressure is improved in the morning, start 3.125 mg carvedilol twice daily.    Restart lactulose, titrate for 3-4 soft bowel movements daily. Take zofran (ondansetron) at least 20-30 minutes before lactulose to help with confusion. Try mixing lactulose in Gatorade.  You should not drive at all due to risks of traffic accidents.    Recommended vaccines: influenza (yearly), updated COVID-19 vaccine, and RSV vaccine.  Eat a high protein diet (1.2-1.5 grams per kg body weight per day, typically about 100 grams of protein a day). Eat small, frequent meals, high-protein late-night snacks, and use protein supplements such as Glucerna.    Take care,  Dorene Sorrow, PA

## 2023-04-01 MED ORDER — OMEPRAZOLE 20 MG CAPSULE,DELAYED RELEASE
ORAL_CAPSULE | Freq: Two times a day (BID) | ORAL | 0 refills | 0 days
Start: 2023-04-01 — End: ?

## 2023-04-02 MED ORDER — OMEPRAZOLE 20 MG CAPSULE,DELAYED RELEASE
ORAL_CAPSULE | Freq: Two times a day (BID) | ORAL | 1 refills | 90 days | Status: CP
Start: 2023-04-02 — End: 2023-09-29

## 2023-04-02 NOTE — Unmapped (Signed)
Refill request for Omeprazole    LCV 03-27-23    Prescription expired--> will pend for provider's approval

## 2023-04-03 DIAGNOSIS — K7682 Hepatic encephalopathy (CMS-HCC): Principal | ICD-10-CM

## 2023-04-03 DIAGNOSIS — R4182 Altered mental status, unspecified: Principal | ICD-10-CM

## 2023-04-03 NOTE — Unmapped (Signed)
Patient called looking for an update on this. Patient stated that the letter is due today. Spoke with Dr. Cathey Endow and she stated that she is waiting on patient's liver doctor to answer the questions that are being asked. Advised patient of this, she said she will reach out to the other doctor. Asked that once we do have the answers to please email it to [email or web address]

## 2023-04-08 NOTE — Unmapped (Signed)
Letter completed.

## 2023-04-08 NOTE — Unmapped (Signed)
Sent!

## 2023-04-08 NOTE — Unmapped (Signed)
Patient called this morning and stated that her Disability paperwork needed to be returned yesterday 9/30. Patient would like to know what dates or period that the Disability paperwork goes through. Please call.

## 2023-04-28 NOTE — Unmapped (Signed)
There is already a letter written with all of this that has been sent to her employer. I have nothing to add. If her employer is saying they did not get it, we need to have a better contact number. Email is not ok as this is not HIPAA compliant.

## 2023-04-30 NOTE — Unmapped (Signed)
Patient called to see if we could fax the recent letter we wrote to the person who needed it. However, she did not have a fax number. Patient will try to find a fax number or will come to the office and ask for a copy to be printed for her. Patient unable to print at home.

## 2023-05-04 MED ORDER — METFORMIN ER 500 MG TABLET,EXTENDED RELEASE 24 HR
ORAL_TABLET | Freq: Every day | ORAL | 1 refills | 0 days
Start: 2023-05-04 — End: ?

## 2023-05-05 MED ORDER — OMEPRAZOLE 20 MG CAPSULE,DELAYED RELEASE
ORAL_CAPSULE | Freq: Every day | ORAL | 0 refills | 56 days
Start: 2023-05-05 — End: ?

## 2023-05-06 MED ORDER — METFORMIN ER 500 MG TABLET,EXTENDED RELEASE 24 HR
ORAL_TABLET | Freq: Every day | ORAL | 2 refills | 90 days | Status: CP
Start: 2023-05-06 — End: ?

## 2023-05-09 NOTE — Unmapped (Signed)
Denise York requested a refill of their Xifaxan via IVR/Web. [facility] has scheduled delivery per the patients request via UPS to be delivered to their prescription address on 05/14/23.

## 2023-05-13 MED FILL — XIFAXAN 550 MG TABLET: ORAL | 90 days supply | Qty: 180 | Fill #3

## 2023-05-31 ENCOUNTER — Ambulatory Visit
Admit: 2023-05-31 | Discharge: 2023-06-03 | Disposition: A | Discharge status: Home / Self Care | Payer: PRIVATE HEALTH INSURANCE | Admitting: Family Medicine

## 2023-05-31 ENCOUNTER — Ambulatory Visit: Admit: 2023-05-31 | Discharge: 2023-06-01 | Payer: PRIVATE HEALTH INSURANCE

## 2023-05-31 LAB — GLUCOSE, BODY FLUID: GLUCOSE FLUID: 102 mg/dL

## 2023-05-31 LAB — URINALYSIS WITH MICROSCOPY WITH CULTURE REFLEX PERFORMABLE
BILIRUBIN UA: NEGATIVE
BILIRUBIN UA: NEGATIVE
BLOOD UA: NEGATIVE
BLOOD UA: NEGATIVE
GLUCOSE UA: NEGATIVE
GLUCOSE UA: NEGATIVE
GRANULAR CASTS: 8 /LPF — ABNORMAL HIGH (ref <=0)
KETONES UA: NEGATIVE
LEUKOCYTE ESTERASE UA: NEGATIVE
NITRITE UA: NEGATIVE
NITRITE UA: NEGATIVE
PH UA: 5.5 (ref 5.0–9.0)
PH UA: 6 (ref 5.0–9.0)
PROTEIN UA: 50 — AB
RBC UA: 3 /HPF (ref <=4)
RBC UA: 17 /HPF — ABNORMAL HIGH (ref <=4)
SPECIFIC GRAVITY UA: 1.028 (ref 1.003–1.030)
SPECIFIC GRAVITY UA: 1.2 — ABNORMAL HIGH (ref 1.003–1.030)
SQUAMOUS EPITHELIAL: 12 /HPF — ABNORMAL HIGH (ref 0–5)
SQUAMOUS EPITHELIAL: 12 /HPF — ABNORMAL HIGH (ref 0–5)
UROBILINOGEN UA: 6 — AB
UROBILINOGEN UA: 2 — AB
WBC UA: 60 /HPF — ABNORMAL HIGH (ref 0–5)
WBC UA: 49 /HPF — ABNORMAL HIGH (ref 0–5)

## 2023-05-31 LAB — COMPREHENSIVE METABOLIC PANEL
ALBUMIN: 2.7 g/dL — ABNORMAL LOW (ref 3.4–5.0)
ALKALINE PHOSPHATASE: 104 U/L (ref 46–116)
ALT (SGPT): 27 U/L (ref 10–49)
ANION GAP: 11 mmol/L (ref 5–14)
AST (SGOT): 47 U/L — ABNORMAL HIGH (ref <=34)
BILIRUBIN TOTAL: 2.3 mg/dL — ABNORMAL HIGH (ref 0.3–1.2)
BLOOD UREA NITROGEN: 9 mg/dL (ref 9–23)
BUN / CREAT RATIO: 9
CALCIUM: 9 mg/dL (ref 8.7–10.4)
CHLORIDE: 102 mmol/L (ref 98–107)
CO2: 27.1 mmol/L (ref 20.0–31.0)
CREATININE: 0.95 mg/dL (ref 0.55–1.02)
EGFR CKD-EPI (2021) FEMALE: 70 mL/min/{1.73_m2} (ref >=60)
GLUCOSE RANDOM: 95 mg/dL (ref 70–179)
POTASSIUM: 3.7 mmol/L (ref 3.4–4.8)
PROTEIN TOTAL: 6.3 g/dL (ref 5.7–8.2)
SODIUM: 140 mmol/L (ref 135–145)

## 2023-05-31 LAB — CBC W/ AUTO DIFF
BASOPHILS ABSOLUTE COUNT: 0 10*9/L (ref 0.0–0.1)
BASOPHILS RELATIVE PERCENT: 0.5 %
EOSINOPHILS ABSOLUTE COUNT: 0.1 10*9/L (ref 0.0–0.5)
EOSINOPHILS RELATIVE PERCENT: 2.8 %
HEMATOCRIT: 32.9 % — ABNORMAL LOW (ref 34.0–44.0)
HEMOGLOBIN: 11.3 g/dL (ref 11.3–14.9)
LYMPHOCYTES ABSOLUTE COUNT: 1.1 10*9/L (ref 1.1–3.6)
LYMPHOCYTES RELATIVE PERCENT: 21.4 %
MEAN CORPUSCULAR HEMOGLOBIN CONC: 34.4 g/dL (ref 32.0–36.0)
MEAN CORPUSCULAR HEMOGLOBIN: 30 pg (ref 25.9–32.4)
MEAN CORPUSCULAR VOLUME: 87.3 fL (ref 77.6–95.7)
MEAN PLATELET VOLUME: 9.7 fL (ref 6.8–10.7)
MONOCYTES ABSOLUTE COUNT: 0.7 10*9/L (ref 0.3–0.8)
MONOCYTES RELATIVE PERCENT: 13.1 %
NEUTROPHILS ABSOLUTE COUNT: 3.2 10*9/L (ref 1.8–7.8)
NEUTROPHILS RELATIVE PERCENT: 62.2 %
NUCLEATED RED BLOOD CELLS: 0 /100{WBCs} (ref <=4)
PLATELET COUNT: 51 10*9/L — ABNORMAL LOW (ref 150–450)
RED BLOOD CELL COUNT: 3.77 10*12/L — ABNORMAL LOW (ref 3.95–5.13)
RED CELL DISTRIBUTION WIDTH: 15.1 % (ref 12.2–15.2)
WBC ADJUSTED: 5.1 10*9/L (ref 3.6–11.2)

## 2023-05-31 LAB — PROTIME-INR
INR: 1.32
PROTIME: 15.1 s — ABNORMAL HIGH (ref 9.9–12.6)

## 2023-05-31 LAB — MAGNESIUM: MAGNESIUM: 1.5 mg/dL — ABNORMAL LOW (ref 1.6–2.6)

## 2023-05-31 LAB — LIPASE: LIPASE: 54 U/L — ABNORMAL HIGH (ref 12–53)

## 2023-05-31 LAB — PRO-BNP: PRO-BNP: 105 pg/mL (ref <=300.0)

## 2023-05-31 LAB — HIGH SENSITIVITY TROPONIN I - SINGLE: HIGH SENSITIVITY TROPONIN I: <3 ng/L (ref <=34)

## 2023-05-31 LAB — ALBUMIN: ALBUMIN: 2.4 g/dL — ABNORMAL LOW (ref 3.4–5.0)

## 2023-05-31 MED ADMIN — acetaminophen (TYLENOL) tablet 650 mg: 650 mg | ORAL | @ 19:00:00 | Stop: 2023-05-31

## 2023-05-31 MED ADMIN — morphine injection 2 mg: 2 mg | INTRAVENOUS | @ 23:00:00 | Stop: 2023-06-01

## 2023-05-31 MED ADMIN — iohexol (OMNIPAQUE) 350 mg iodine/mL solution 100 mL: 100 mL | INTRAVENOUS | @ 21:00:00 | Stop: 2023-05-31

## 2023-05-31 MED ADMIN — ondansetron (ZOFRAN-ODT) disintegrating tablet 4 mg: 4 mg | ORAL | @ 19:00:00 | Stop: 2023-05-31

## 2023-05-31 NOTE — Unmapped (Signed)
[facility]  Emergency Department Provider Note    ED Clinical Impression     Final diagnoses:   Abdominal pain, unspecified abdominal location (Primary)       HPI, ED Course, Assessment and Plan     Initial Clinical Impression:    May 31, 2023 1:30 PM   Denise York is a 57 y.o. 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 The patient reports 1 week of severe abdominal distention with associated abdominal pain that is worsened by movement and improved with sleep.  She states that her abdominal pain feels mostly due to distention.  She feels that it is worse when she moves and she feels liquid flushing around.  She notes that in the past, prior to her liver issues, her bloating was resolved with Lasix, which she takes 40 mg of daily. The patient notes her last bowel movement occurred last night as watery diarrhea. She cannot recall when her last formed stool was. She states her weight is around 200 lb at baseline but she currently weighs 224 lb. She endorses 1000 mg of Tylenol use per day for her frequent headaches. Per the patient's daughter at bedside, who she lives with at home, the patient has had mild confusion with her hepatic encephalopathy in the past, but is currently at her mental status baseline. She is oriented to the world around her but is confused at times which has been her baseline since her liver issues started. Her surgical history includes 2 bladder tacks, cholecystectomy, abdominal wall mesh placement and removal, hysterectomy, and polyp removal. No significant diet changes.  No recent missed medications.  No recent foreign travel. No recent antibiotics or hospitalizations. No EtOH use. She denies fevers, emesis, melena, hematochezia, cough, congestion, rhinorrhea, dysuria, increased urinary frequency, chest pain, or shortness of breath.    Per chart review, the patient has no history of jaundice, ascites, or esophageal variceal despite her history of NASH with HE. Her EGD on 01/15/2023 showed grade I esophageal varices, superficial gastric ulcers, and portal hypertensive gastropathy.     BP 134/69  - Pulse 87  - Temp 36.7 ??C (98.1 ??F) (Oral)  - Resp 18  - Ht 165.1 cm (5' 5)  - Wt (!) 102.6 kg (226 lb 3.2 oz)  - SpO2 100%  - BMI 37.64 kg/m??   Initial vital signs WNL.   Pertinent physical exam: On my initial evaluation, the patient is nontoxic appearing, in no acute distress. Heart rate and rhythm regular. Lungs clear to auscultation bilaterally. 1+ symmetric pitting edema to BLE. Brisk 2+ symmetric DP and PT pulses. Abdomen is distended and diffusely moderately tender. Fluid wave present.  No caput medusae.  No asterixis.  No scleral icterus.    Medical Decision Making  This is a 57 y.o. female with history as above presenting with 1 week of severe abdominal distention with associated abdominal pain that is worsened by movement. Patient found to be volume overloaded on exam likely secondary to first-time onset of ascites with her cirrhosis based on history, exam, and work up. Also considered acute intra-abdominal process, SBP, however these etiologies are less likely.  Will pursue diagnostic paracentesis to evaluate for this but do not feel the patient needs empiric coverage following this given lack of fever, reassuring abdominal exam, believe that her discomfort is mostly  secondary to volume of ascites.  Anticipate admission for large-volume paracentesis and further medical optimization.    Further ED updates and updates to plan as per ED Course below:    ED Course:  ED Course as of 05/31/23 2340   Sat May 31, 2023   1424 CBC without leukocytosis, anemia, or thrombocytopenia.  CMP with mildly elevated bilirubin and AST, otherwise without actionable electrolyte abnormalities.  UA with many bacteria, some squames, will recollect to establish whether reflective of infection.  PT mildly elevated, INR not markedly elevated.  Lipase not reflective of pancreatitis.  Magnesium mildly low.  Troponin negative.   1624 EKG on my interpretation with normal sinus rhythm, rate of 85, normal axis, appropriate intervals without ST-T changes concerning for acute ischemia   1720 Paracentesis performed at bedside with 20 cc of clear yellow ascitic fluid.  Patient tolerated well.   1800 CT abdomen pelvis with sequelae of known liver disease and portal hypertension, with possible filling defects versus subsegmental PE.  Given the patient's lack of chest pain or shortness of breath, patient is afebrile, nontachycardic, saturating well on room air, negative troponin, and symmetric bilateral lower extremity edema, I have very low clinical concern for PE.  Much more likely that these represent artifact.   1818 BNP within normal limits.   1818 I have paged [facility]O for admission for LVP   1900 Repeat urinalysis still contaminated, difficult to tell if there is UTI present.  Will leave up to admitting team with regard to treatment versus awaiting culture results, patient is without dysuria though she does have diffuse abdominal pain.  Reassuringly, paracentesis results demonstrate 96 nucleated cells not concerning overall for SBP.   2019 I have spoken with admitting team with respect to this patient.  They will be down to evaluate her.       MDM Elements  I have reviewed recent and relevant previous record, including: Outpatient notes - 02/11/2023 [facility] VIR Visit Note for past medical history.  Independent Interpretation of Studies: CT scan(s) - Sequelae of known liver disease and portal hypertension, with possible filling defects versus subsegmental PE. Given the patient's lack of chest pain or shortness of breath, patient is afebrile, nontachycardic, saturating well on room air, negative troponin, and symmetric bilateral lower extremity edema, I have very low clinical concern for PE. Much more likely that these represent artifac  Prescription drug(s) considered but not prescribed: N/A  Diagnostic test(s) considered but not performed: N/A  Social Determinants that significantly affected care: N/A  Discussion of Management with other Physicians, QHP or Appropriate Source: Admitting team -    Escalation of Care including OBS/Admission/Transfer was considered: Patient is appropriate for admission given the findings noted in the ED course and MDM.     ____________________________________________    The case was discussed with the attending physician who is in agreement with the above assessment and plan.     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    Obesity     Prediabetes        Past Surgical History:   Procedure Laterality Date    ABDOMINAL WALL MESH  REMOVAL  1997, 2005  Placement, 1997, removal and replacement 2005    BLADDER SURGERY      bladder tact X2    BREAST BIOPSY Right 04/2013    benign    BREAST EXCISIONAL BIOPSY Right 05/2013    benign    BREAST SURGERY Right     precancerous spot removed    CHOLECYSTECTOMY      HYSTERECTOMY      IR EMBOLIZATION VENOUS OTHER THAN HEMORRHAGE  02/17/2023    IR EMBOLIZATION VENOUS OTHER THAN HEMORRHAGE 02/17/2023 Ammie Dalton, MD IMG VIR [facility]    PR COLSC FLX W/RMVL OF TUMOR POLYP LESION SNARE TQ N/A 10/14/2016    Procedure: COLONOSCOPY FLEX; W/REMOV TUMOR/LES BY SNARE;  Surgeon: Alfred Levins, MD;  Location: [facility] GI PROCEDURES [place];  Service: Gastroenterology    PR COLSC FLX W/RMVL OF TUMOR POLYP LESION SNARE TQ N/A 01/15/2023    Procedure: COLONOSCOPY FLEX; W/REMOV TUMOR/LES BY SNARE;  Surgeon: Mickie Hillier, MD;  [facility]: [facility];  Service: Gastroenterology    PR PERC CLOS,CONG INTERATRIAL COMMUN W/IMPL N/A 09/12/2022    Procedure: ASD closure;  Surgeon: Jacquelyne Balint, MD;  Location: [facility] CATH;  Service: Cardiology    PR UPPER GI ENDOSCOPY,BIOPSY N/A 01/15/2023    Procedure: UGI ENDOSCOPY; WITH BIOPSY, SINGLE OR MULTIPLE;  Surgeon: Mickie Hillier, MD;  Location: [facility];  Service: Gastroenterology    SKIN BIOPSY         MEDICATIONS:     Current Facility-Administered Medications:     [START ON 06/01/2023] aspirin chewable tablet 81 mg, 81 mg, Oral, Daily, Maxwell Caul, MD    [START ON 06/01/2023] atorvastatin (LIPITOR) tablet 20 mg, 20 mg, Oral, Daily, Maxwell Caul, MD    carvedilol (COREG) tablet 3.125 mg, 3.125 mg, Oral, BID, Maxwell Caul, MD, 3.125 mg at 05/31/23 2232    [START ON 06/01/2023] DULoxetine (CYMBALTA) DR capsule 20 mg, 20 mg, Oral, Daily, Maxwell Caul, MD    [Provider Hold] enoxaparin (LOVENOX) syringe 40 mg, 40 mg, Subcutaneous, Q24H, Maxwell Caul, MD    Melene Muller ON 06/01/2023] furosemide (LASIX) tablet 40 mg, 40 mg, Oral, Daily, Maxwell Caul, MD    lactulose oral solution, 20 g, Oral, TID, Maxwell Caul, MD, 20 g at 05/31/23 2232    lidocaine-EPINEPHrine (XYLOCAINE W/EPI) 1 %-1:100,000 injection 1 mL, 1 mL, Infiltration, Once, Ripley Fraise, MD    [START ON 06/01/2023] metFORMIN (GLUCOPHAGE-XR) 24 hr tablet 500 mg, 500 mg, Oral, Daily, Maxwell Caul, MD    morphine injection 2 mg, 2 mg, Intravenous, Q3H PRN, Ripley Fraise, MD, 2 mg at 05/31/23 2231    ondansetron (ZOFRAN-ODT) disintegrating tablet 4 mg, 4 mg, Oral, Q8H PRN, Maxwell Caul, MD    Melene Muller ON 06/01/2023] pantoprazole (Protonix) EC tablet 20 mg, 20 mg, Oral, Daily, Maxwell Caul, MD    rifAXIMin [employer]) tablet 550 mg, 550 mg, Oral, BID, Maxwell Caul, MD, 550 mg at 05/31/23 2232    rOPINIRole (REQUIP) tablet 0.25 mg, 0.25 mg, Oral, BID, Maxwell Caul, MD, 0.25 mg at 05/31/23 2232    [START ON 06/01/2023] spironolactone (ALDACTONE) tablet 100 mg, 100 mg, Oral, Daily, Maxwell Caul, MD    traZODone (DESYREL) tablet 50 mg, 50 mg, Oral, Nightly, Maxwell Caul, MD, 50 mg at 05/31/23 2232    ALLERGIES:   Penicillins, Shellfish containing products, Glipizide, and Lisinopril    SOCIAL HISTORY:   Social History  Tobacco Use    Smoking status: Never     Passive exposure: Past    Smokeless tobacco: Never   Substance Use Topics    Alcohol use: No       FAMILY HISTORY:  Family History   Problem Relation Age of Onset    Heart disease Mother     Hypertension Mother     Heart disease Father     Kidney disease Father     Allergies Father     Gout Father     Allergies Other         FAMILY H/O    Coronary artery disease Other         FAMILY H/O    Kidney failure Other         FAMILY H/O    No Known Problems Sister     No Known Problems Daughter     No Known Problems Maternal Grandmother     No Known Problems Maternal Grandfather     No Known Problems Paternal Grandmother     No Known Problems Paternal Grandfather     BRCA 1/2 Neg Hx     Breast cancer Neg Hx     Cancer Neg Hx     Colon cancer Neg Hx     Endometrial cancer Neg Hx     Ovarian cancer Neg Hx     Substance Abuse Disorder Neg Hx     Mental illness Neg Hx           Review of Systems   A review of systems was performed and relevant portions were as noted above in HPI     Physical Exam     VITAL SIGNS:    BP 134/69  - Pulse 87  - Temp 36.7 ??C (98.1 ??F) (Oral)  - Resp 18  - Ht 165.1 cm (5' 5)  - Wt (!) 102.6 kg (226 lb 3.2 oz)  - SpO2 100%  - BMI 37.64 kg/m??     Constitutional:   Alert and oriented. In no acute distress. Please see pertinent exam above as well.  Head:   Normocephalic and atraumatic  Eyes:   Conjunctivae are normal, EOMI, PERRL  ENT:   No notable congestion, mucous membranes moist, external ears normal, no notable stridor  Cardiovascular:   Rate as vitals above. Appears warm and well perfused. Brisk 2+ symmetric pulses.  Respiratory:   Normal respiratory effort. Breath sounds are normal.  Gastrointestinal:   Abdomen is distended and diffusely moderately tender. Fluid wave present.   Genitourinary:   Deferred  Musculoskeletal:    Normal range of motion in all extremities. 2+ symmetric pitting edema to BLE.   Neurologic:   No gross focal neurologic deficits beyond baseline are appreciated  Skin:   Skin is warm, dry and intact    Radiology     CTA Chest W Contrast   Final Result      No acute pulmonary embolism or other acute findings.         CT Abdomen Pelvis W IV Contrast Only   Final Result   1.  No acute abnormality is identified throughout the abdomen or pelvis to explain etiology of patient's reported abdominal pain.   2.  Several rounded filling defects are seen within what appear to be subsegmental pulmonary arteries within the dependent aspects of both lower lobes (series 2, images 1 and 4). These findings are suboptimally evaluated due to limited field-of-view and contrast bolus timing on  today's exam, however, could potentially reflect artifact versus several subsegmental pulmonary emboli. Further evaluation with dedicated CT PE protocol is recommended for more definitive characterization   3.  Cirrhotic liver morphology with sequelae of portal hypertension including mild splenomegaly, small caliber upper abdominal varices, and large volume abdominopelvic ascites (as described). No obvious focal hepatic lesion is identified within the limitations of single phase images obtained.   4.  Other chronic or incidental findings, as described within the body of the report.      These findings were discussed via telephone with the Dr. Herbie Drape, MD by Dr. Eugene Garnet on 05/31/2023 at 5:40 p.m.          Labs     Labs Reviewed   COMPREHENSIVE METABOLIC PANEL - Abnormal; Notable for the following components:       Result Value    Albumin 2.7 (*)     Total Bilirubin 2.3 (*)     AST 47 (*)     All other components within normal limits   MAGNESIUM - Abnormal; Notable for the following components:    Magnesium 1.5 (*)     All other components within normal limits   LIPASE - Abnormal; Notable for the following components:    Lipase 54 (*)     All other components within normal limits   PROTIME-INR - Abnormal; Notable for the following components:    PT 15.1 (*)     All other components within normal limits   ALBUMIN - Abnormal; Notable for the following components:    Albumin 2.4 (*)     All other components within normal limits   CBC W/ AUTO DIFF - Abnormal; Notable for the following components:    RBC 3.77 (*)     HCT 32.9 (*)     Platelet 51 (*)     All other components within normal limits   URINALYSIS WITH MICROSCOPY WITH CULTURE REFLEX PERFORMABLE - Abnormal; Notable for the following components:    Leukocyte Esterase, UA Moderate (*)     Protein, UA 50 mg/dL (*)     Ketones, UA Trace (*)     Urobilinogen, UA 6.0 mg/dL (*)     WBC, UA 60 (*)     Squam Epithel, UA 12 (*)     Bacteria, UA Many (*)     Granular Casts, UA 8 (*)     Mucus, UA Many (*)     All other components within normal limits   URINALYSIS WITH MICROSCOPY WITH CULTURE REFLEX PERFORMABLE - Abnormal; Notable for the following components:    Specific Gravity, UA 1.200 (*)     Protein, UA Trace (*)     Urobilinogen, UA 2.0 mg/dL (*)     RBC, UA 17 (*)     WBC, UA 49 (*)     Squam Epithel, UA 12 (*)     Bacteria, UA Many (*)     Mucus, UA Rare (*)     All other components within normal limits   HIGH SENSITIVITY TROPONIN I - SINGLE - Normal   PRO-BNP - Normal    Narrative:     Effective 03/17/23, NT-proBNP replaces BNP, results are not interchangeable. Values less than 300 pg/mL strongly rule out the presence of acute heart failure.   POCT GLUCOSE, INTERFACED - Normal   POCT GLUCOSE, INTERFACED - Normal   BODY FLUID CELL COUNT   ASCITIC/PERITONEAL FLUID CULTURE    Narrative:     Specimen Source: Peritoneum  PROTEIN, BODY FLUID   ALBUMIN, FLUID   URINE CULTURE URINE CULTURE   CBC W/ DIFFERENTIAL    Narrative:     The following orders were created for panel order CBC w/ Differential.                  Procedure                               Abnormality         Status                                     ---------                               -----------         ------                                     CBC w/ [id number]]         Abnormal            Final result                                                 Please view results for these tests on the individual orders.   URINALYSIS WITH MICROSCOPY WITH CULTURE REFLEX    Narrative:     The following orders were created for panel order Urinalysis with Microscopy with Culture Reflex.                  Procedure                               Abnormality         Status                                     ---------                               -----------         ------                                     Urinalysis with Microsc.Marland KitchenMarland Kitchen[[id number]]  Abnormal            Final result                                                 Please view results for these tests on the individual orders.   URINALYSIS WITH MICROSCOPY WITH CULTURE REFLEX    Narrative:     The following orders were created for panel order Urinalysis with Microscopy with Culture Reflex.  Procedure                               Abnormality         Status                                     ---------                               -----------         ------                                     Urinalysis with Microsc.Marland KitchenMarland Kitchen[[id number]]  Abnormal            Final result                                                 Please view results for these tests on the individual orders.   GLUCOSE, BODY FLUID   CYTOLOGY - FLUID   POCT GLUCOSE-RN OBTAIN       Pertinent labs & imaging results that were available during my care of the patient were reviewed by me and considered in my medical decision making. Labs and radiology studies included in my note may not constitute all ordered/reviewied labs during this encounter (see chart for details).      Please note- This chart has been created using [employer]. Chart creation errors have been sought, but may not always be located and such creation errors, especially pronoun confusion, do NOT reflect on the standard of medical care.    Documentation assistance was provided by Sharol Given, Scribe, on May 31, 2023 at 1:30 PM for Marlyn Corporal, MD    May 31, 2023 11:44 PM. Documentation assistance provided by the scribe. I was present during the time the encounter was recorded. The information recorded by the scribe was done at my direction and has been reviewed and validated by me.        Ripley Fraise, MD  Resident  05/31/23 [phone]

## 2023-05-31 NOTE — Unmapped (Signed)
The patient reports abdominal pain, nausea and bloated for about a week. She gained ~ 20 lbs in a week. States her last Bm was last night but it just yellow water. Hx of constipation. Seen at [facility] today and sent here for further evaluation

## 2023-05-31 NOTE — Unmapped (Signed)
Assessment:     Problem List Items Addressed This Visit    None  Visit Diagnoses         Abdominal pain, acute, generalized    -  Primary             Plan:     Patient seen during Saturday clinic.  No laboratories or x-rays available   Large distended abdomen grossly tender to palpation with suggestion of rebound tenderness   Needs CBC, CMP, CT abdomen  Called report to [facility] ER    NPO until further notice.   Furhter management decisions, inaging, and labs per ER   F/u with PCP prn.     Subjective:     CC:    Chief Complaint   Patient presents with    having issues with bowel movement     Liquid bowel movement     bloated stomach     Patient stated she can't eat       HPI:  Denise York is a 57 y.o. female who presents for evaluation of 5 to 7 days of feeling progressive bloating and abdominal pain.  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 Past medical history of hepatic encephalopathy and portal hypertension.  Patient is a type II diabetic.  Reports early satiety and feeling full almost immediately when trying to eat.  She has very little appetite.  Abdominal pain is intensified by position and movement.  Denies fever or blood in the toilet.  Reports her bowel movements have been small and liquid in nature.  Denies change in bladder habits.  Status postcholecystectomy    Review of Systems:  A ten system ROS was conducted.    She reports the following pertinent positive ROS: as above      She reports the following pertinent negative ROS: as above    Allergies:  Penicillins, Shellfish containing products, Glipizide, and Lisinopril    Patient Care Team:  Cathey Endow Heywood Footman, MD as PCP - General (Family Medicine)  Cathey Endow, Heywood Footman, MD as PCP - Jori Moll, PA as Physician Assistant (Gastroenterology)  Jacquelyne Balint, MD as Consulting Physician (Cardiovascular Disease)  Social History     Socioeconomic History    Marital status: Married   Tobacco Use    Smoking status: Never     Passive exposure: Past    Smokeless tobacco: Never   Vaping Use    Vaping status: Never Used   Substance and Sexual Activity    Alcohol use: No    Drug use: No    Sexual activity: Not Currently   Other Topics Concern    Do you use sunscreen? Yes    Excessive sun exposure? Yes   Social History Narrative    2 births. No living children. Married new husband 2014.         02/21/2017        PCMH Components:        Family, social, cultural characteristics: social support includes best friend .      Patient has the following communication needs: none, per patient     Health Literacy: How confident are you that you understand your health issues/concerns, can participate in your care, and manage your care along with your physician: confident.    Behaviors Affecting Health: none, per patient    Family history of mental health illness and/or substance abuse: asked patient/parent and none disclosed.    Have you been seen by any  medical provider that we have not referred you to since your last visit ? No    Discussed a Living Will with the patient and OZH:[id number] is under the age of 2.      Social Drivers of Health     Food Insecurity: Patient Declined (12/19/2022)    Hunger Vital Sign     Worried About Running Out of Food in the Last Year: Patient declined     Ran Out of Food in the Last Year: Patient declined     Family History   Problem Relation Age of Onset    Heart disease Mother     Hypertension Mother     Heart disease Father     Kidney disease Father     Allergies Father     Gout Father     Allergies Other         FAMILY H/O    Coronary artery disease Other         FAMILY H/O    Kidney failure Other         FAMILY H/O    No Known Problems Sister     No Known Problems Daughter     No Known Problems Maternal Grandmother     No Known Problems Maternal Grandfather     No Known Problems Paternal Grandmother     No Known Problems Paternal Grandfather     BRCA 1/2 Neg Hx     Breast cancer Neg Hx     Cancer Neg Hx     Colon cancer Neg Hx     Endometrial cancer Neg Hx     Ovarian cancer Neg Hx     Substance Abuse Disorder Neg Hx     Mental illness Neg Hx      Past Medical History:   Diagnosis Date    Back pain     Depressed     Fatigue     Joint pain     Neoplasm of right breast, 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    Obesity     Prediabetes      OB History       Gravida   2    Para   2    Term   2    Preterm        AB        Living   0         SAB        IAB        Ectopic        Molar        Multiple        Live Births   2              Past Surgical History:   Procedure Laterality Date    ABDOMINAL WALL MESH  REMOVAL  1997, 2005    Placement, 1997, removal and replacement 2005    BLADDER SURGERY      bladder tact X2    BREAST BIOPSY Right 04/2013    benign    BREAST EXCISIONAL BIOPSY Right 05/2013    benign    BREAST SURGERY Right     precancerous spot removed    CHOLECYSTECTOMY      HYSTERECTOMY      IR EMBOLIZATION VENOUS OTHER THAN HEMORRHAGE  02/17/2023    IR EMBOLIZATION VENOUS OTHER THAN HEMORRHAGE 02/17/2023 Ammie Dalton, MD IMG  VIR [facility]    PR COLSC FLX W/RMVL OF TUMOR POLYP LESION SNARE TQ N/A 10/14/2016    Procedure: COLONOSCOPY FLEX; W/REMOV TUMOR/LES BY SNARE;  Surgeon: Alfred Levins, MD;  Location: [facility] PROCEDURES [place];  Service: Gastroenterology    PR COLSC FLX W/RMVL OF TUMOR POLYP LESION SNARE TQ N/A 01/15/2023    Procedure: COLONOSCOPY FLEX; W/REMOV TUMOR/LES BY SNARE;  Surgeon: Mickie Hillier, MD;  Location: [facility];  Service: Gastroenterology    PR PERC CLOS,CONG INTERATRIAL COMMUN W/IMPL N/A 09/12/2022    Procedure: ASD closure;  Surgeon: Jacquelyne Balint, MD;  Location: [facility] C[facility];  Service: Cardiology    PR UPPER GI ENDOSCOPY,BIOPSY N/A 01/15/2023    Procedure: UGI ENDOSCOPY; WITH BIOPSY, SINGLE OR MULTIPLE;  Surgeon: Mickie Hillier, MD;  Location: [facility] PROC[facility];  Service: Gastroenterology    SKIN BIOPSY Patient Active Problem List   Diagnosis    Insomnia    Vitamin deficiency    Dysthymic disorder    Restless leg syndrome    Urinary frequency    Incomplete bladder emptying    Nonspecific abnormal results of liver function study    Edema    Vitamin D deficiency    Itch    Fatty liver disease, nonalcoholic    Constipation    Depression    Neoplasm of right breast, 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    Type 2 diabetes mellitus without complication (CMS-HCC)    Bowel incontinence    Mixed incontinence urge and stress    Encounter for long-term current use of medication    Obesity    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    Back pain    Neoplasm of uncertain behavior    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)    Portal hypertension (CMS-HCC)    Hepatic encephalopathy (CMS-HCC)    Thrombocytopenia (CMS-HCC)    Melena    Lower extremity edema    ASD (atrial septal defect)    Nausea and vomiting     Immunization History   Administered Date(s) Administered    COVID-19 VAC,MRNA,TRIS(12Y UP)(PFIZER)(GRAY CAP) 08/19/2020    COVID-19 VACC,MRNA,(PFIZER)(PF) 10/02/2019, 10/23/2019    Covid-19 Vac, (43yr+) (Comirnaty) [employer]  04/08/2022    HEPATITIS B VACCINE ADULT, ADJUVANTED, IM(HEPLISAV B) 06/17/2022, 09/26/2022    Hepatitis A (Adult) 06/17/2022, 09/26/2022    INFLUENZA INJ MDCK PF, QUAD,(FLUCELVAX)(19MO AND UP EGG FREE) 05/15/2021    INFLUENZA TIV (TRI) PF (IM)(HISTORICAL) 04/09/2011    Influenza TRI (IIV3) 5+yrs MDV 06/04/2013    Influenza Vaccine Quad(IM)6 MO-Adult(PF) 05/08/2017, 04/09/2018, 03/11/2019    Influenza Virus Vaccine, unspecified formulation 04/08/2015, 04/07/2016, 05/04/2020, 04/08/2022    PNEUMOCOCCAL POLYSACCHARIDE 23-VALENT 07/19/2014    Pneumococcal Conjugate 20-valent 06/17/2022    SHINGRIX-ZOSTER VACCINE (HZV),RECOMBINANT,ADJUVANTED(IM) 11/09/2020, 05/15/2021    TdaP 07/16/2011, 06/17/2022       Health Maintenance   Topic Date Due    Hemoglobin A1c  12/27/2022    COVID-19 Vaccine (5 - 2024-25 season) 03/09/2023    Influenza Vaccine (1) 03/09/2023    Foot Exam  05/09/2023    Urine Albumin/Creatinine Ratio  05/09/2023    Retinal Eye Exam  10/02/2023    Mammogram  10/28/2023    Serum Creatinine Monitoring  03/26/2024    Potassium Monitoring  03/26/2024    Colon Cancer Screening  01/15/2028    DTaP/Tdap/Td Vaccines (3 - Td or Tdap) 06/17/2032    Pneumococcal  Vaccine 0-64  Completed    Hepatitis C Screen  Completed    Zoster Vaccines  Completed       Medications:  Current Outpatient Medications   Medication Sig Dispense Refill    aspirin (ECOTRIN) 81 MG tablet Take 1 tablet (81 mg total) by mouth daily.      blood sugar diagnostic (GLUCOSE BLOOD) Strp Use to test blood sugar daily 100 strip 3    blood-glucose meter kit Use to test blood sugar DAILY 1 each 1    cholecalciferol, vitamin D3, 2,000 unit cap Take 2 capsules (4,000 Units total) by mouth daily. (Patient taking differently: Take 1 capsule (50 mcg total) by mouth daily.) 1 each 0    DULoxetine (CYMBALTA) 20 MG capsule Take 1 capsule (20 mg total) by mouth daily. 90 capsule 1    furosemide (LASIX) 40 MG tablet TAKE 1 TABLET BY MOUTH EVERY DAY 90 tablet 1    inhalational spacing device (AEROCHAMBER MV) Spcr Use as directed with inhalers 1 each 0    lancets Misc Use to test blood sugar daily 100 each 3    loratadine (CLARITIN) 10 mg tablet TAKE 1 TABLET BY MOUTH EVERY DAY 90 tablet 1    magnesium oxide (MAG-OX) 400 mg (241.3 mg elemental magnesium) tablet Take 1 tablet (400 mg total) by mouth daily.      metFORMIN (GLUCOPHAGE-XR) 500 MG 24 hr tablet TAKE 1 TABLET BY MOUTH DAILY WITH EVENING MEAL. 90 tablet 2    multivitamin (TAB-A-VITE/THERAGRAN) per tablet Take 1 tablet by mouth daily.      omeprazole (PRILOSEC) 20 MG capsule TAKE 1 CAPSULE (20 MG TOTAL) BY MOUTH TWO (2) TIMES A DAY (30 MINUTES BEFORE A MEAL). 180 capsule 1    psyllium seed, with sugar, (FIBER ORAL) Take 4 capsules by mouth daily. [employer] brand.      rifAXIMin (XIFAXAN) 550 mg Tab Take 1 tablet (550 mg total) by mouth two (2) times a day. 180 tablet 3    rOPINIRole (REQUIP) 0.25 MG tablet TAKE 1 TABLET (0.25 MG TOTAL) BY MOUTH TWO (2) TIMES A DAY. 180 tablet 1    semaglutide (OZEMPIC) 2 mg/dose (8 mg/3 mL) PnIj Inject 2 mg under the skin every seven (7) days. 9 mL 3    traZODone (DESYREL) 50 MG tablet Take 1 tablet (50 mg total) by mouth nightly. 90 tablet 3    atorvastatin (LIPITOR) 20 MG tablet Take 1 tablet (20 mg total) by mouth daily. (Patient not taking: Reported on 05/31/2023) 90 tablet 3    carvedilol (COREG) 3.125 MG tablet Take 1 tablet (3.125 mg total) by mouth two (2) times a day. 180 tablet 3    spironolactone (ALDACTONE) 100 MG tablet TAKE 1 TABLET BY MOUTH EVERY DAY (Patient not taking: Reported on 05/31/2023) 90 tablet 1     No current facility-administered medications for this visit.         Objective:     VITAL SIGNS:    Vitals:    05/31/23 1230   BP: 124/72   BP Site: L Arm   BP Position: Sitting   BP Cuff Size: Medium   Pulse: 91   Resp: 17   Temp: 36.8 ??C (98.3 ??F)   TempSrc: Oral   SpO2: 99%   Weight: (!) 101.9 kg (224 lb 9.6 oz)     Wt Readings from Last 2 Encounters:   05/31/23 (!) 101.9 kg (224 lb 9.6 oz)   03/27/23 91.7 kg (202 lb 1.6  oz)       GENERAL: Acutely ill-appearing, alert, in mild distress   HEAD: Normocephalic and atraumatic  EYES: No exophthalmos. EOMI. Lids- No crusting or drainage. Conjunctiva- clear.  NECK: Supple w/o adenopathy, thyromegaly or masses  CHEST: Clear to auscultation bilaterally.  Respirations are unlabored.    HEART: RRR w/o murmurs  ABDOMEN: Distended with possible ascites.  Tender to touch in all 4 quadrants.  Positive Murphy sign.  Positive rebound tenderness bilateral lower quadrants.  Did not feel a liver edge.  No other distinct masses felt.   GENITOURINARY: Mild bilateral CVAT; positive for suprapubic tenderness with radiation into the abdomen.    INTEGUMENT: Skin is warm and dry.  No rashes or concerning skin lesions.        POCT LABS AND TESTS:  No results found for this or any previous visit (from the past 24 hours).

## 2023-06-01 LAB — BASIC METABOLIC PANEL
ANION GAP: 11 mmol/L (ref 5–14)
BLOOD UREA NITROGEN: 7 mg/dL — ABNORMAL LOW (ref 9–23)
BUN / CREAT RATIO: 7
CALCIUM: 9.3 mg/dL (ref 8.7–10.4)
CHLORIDE: 101 mmol/L (ref 98–107)
CO2: 29.3 mmol/L (ref 20.0–31.0)
CREATININE: 0.98 mg/dL (ref 0.55–1.02)
EGFR CKD-EPI (2021) FEMALE: 67 mL/min/{1.73_m2} (ref >=60)
GLUCOSE RANDOM: 125 mg/dL (ref 70–179)
POTASSIUM: 4.3 mmol/L (ref 3.4–4.8)
SODIUM: 141 mmol/L (ref 135–145)

## 2023-06-01 LAB — COMPREHENSIVE METABOLIC PANEL
ALBUMIN: 2.3 g/dL — ABNORMAL LOW (ref 3.4–5.0)
ALKALINE PHOSPHATASE: 78 U/L (ref 46–116)
ALT (SGPT): 23 U/L (ref 10–49)
ANION GAP: 11 mmol/L (ref 5–14)
AST (SGOT): 45 U/L — ABNORMAL HIGH (ref <=34)
BILIRUBIN TOTAL: 1.5 mg/dL — ABNORMAL HIGH (ref 0.3–1.2)
BLOOD UREA NITROGEN: 8 mg/dL — ABNORMAL LOW (ref 9–23)
BUN / CREAT RATIO: 8
CALCIUM: 9.2 mg/dL (ref 8.7–10.4)
CHLORIDE: 103 mmol/L (ref 98–107)
CO2: 29.3 mmol/L (ref 20.0–31.0)
CREATININE: 0.97 mg/dL (ref 0.55–1.02)
EGFR CKD-EPI (2021) FEMALE: 68 mL/min/{1.73_m2} (ref >=60)
GLUCOSE RANDOM: 110 mg/dL (ref 70–179)
POTASSIUM: 3.8 mmol/L (ref 3.4–4.8)
PROTEIN TOTAL: 5.3 g/dL — ABNORMAL LOW (ref 5.7–8.2)
SODIUM: 143 mmol/L (ref 135–145)

## 2023-06-01 LAB — PROTIME-INR
INR: 1.37
PROTIME: 15.6 s — ABNORMAL HIGH (ref 9.9–12.6)

## 2023-06-01 LAB — CBC W/ AUTO DIFF
BASOPHILS ABSOLUTE COUNT: 0 10*9/L (ref 0.0–0.1)
BASOPHILS RELATIVE PERCENT: 0.8 %
EOSINOPHILS ABSOLUTE COUNT: 0.1 10*9/L (ref 0.0–0.5)
EOSINOPHILS RELATIVE PERCENT: 4.4 %
HEMATOCRIT: 28.4 % — ABNORMAL LOW (ref 34.0–44.0)
HEMOGLOBIN: 9.6 g/dL — ABNORMAL LOW (ref 11.3–14.9)
LYMPHOCYTES ABSOLUTE COUNT: 0.9 10*9/L — ABNORMAL LOW (ref 1.1–3.6)
LYMPHOCYTES RELATIVE PERCENT: 28.1 %
MEAN CORPUSCULAR HEMOGLOBIN CONC: 33.7 g/dL (ref 32.0–36.0)
MEAN CORPUSCULAR HEMOGLOBIN: 29.4 pg (ref 25.9–32.4)
MEAN CORPUSCULAR VOLUME: 87.3 fL (ref 77.6–95.7)
MEAN PLATELET VOLUME: 10.2 fL (ref 6.8–10.7)
MONOCYTES ABSOLUTE COUNT: 0.5 10*9/L (ref 0.3–0.8)
MONOCYTES RELATIVE PERCENT: 14.4 %
NEUTROPHILS ABSOLUTE COUNT: 1.7 10*9/L — ABNORMAL LOW (ref 1.8–7.8)
NEUTROPHILS RELATIVE PERCENT: 52.3 %
NUCLEATED RED BLOOD CELLS: 0 /100{WBCs} (ref <=4)
PLATELET COUNT: 41 10*9/L — ABNORMAL LOW (ref 150–450)
RED BLOOD CELL COUNT: 3.26 10*12/L — ABNORMAL LOW (ref 3.95–5.13)
RED CELL DISTRIBUTION WIDTH: 15 % (ref 12.2–15.2)
WBC ADJUSTED: 3.2 10*9/L — ABNORMAL LOW (ref 3.6–11.2)

## 2023-06-01 LAB — MAGNESIUM: MAGNESIUM: 1.7 mg/dL (ref 1.6–2.6)

## 2023-06-01 LAB — HEMOGLOBIN A1C
ESTIMATED AVERAGE GLUCOSE: 103 mg/dL
HEMOGLOBIN A1C: 5.2 % (ref 4.8–5.6)

## 2023-06-01 MED ADMIN — lactulose oral solution: 20 g | ORAL | @ 19:00:00

## 2023-06-01 MED ADMIN — DULoxetine (CYMBALTA) DR capsule 20 mg: 20 mg | ORAL | @ 14:00:00 | Stop: 2023-06-01

## 2023-06-01 MED ADMIN — lactulose oral solution: 20 g | ORAL | @ 04:00:00

## 2023-06-01 MED ADMIN — carvedilol (COREG) tablet 3.125 mg: 3.125 mg | ORAL | @ 04:00:00

## 2023-06-01 MED ADMIN — furosemide (LASIX) injection 40 mg: 40 mg | INTRAVENOUS | @ 16:00:00 | Stop: 2023-06-01

## 2023-06-01 MED ADMIN — furosemide (LASIX) tablet 40 mg: 40 mg | ORAL | @ 14:00:00 | Stop: 2023-06-01

## 2023-06-01 MED ADMIN — carvedilol (COREG) tablet 3.125 mg: 3.125 mg | ORAL | @ 14:00:00

## 2023-06-01 MED ADMIN — rOPINIRole (REQUIP) tablet 0.25 mg: .25 mg | ORAL | @ 14:00:00

## 2023-06-01 MED ADMIN — spironolactone (ALDACTONE) tablet 100 mg: 100 mg | ORAL | @ 14:00:00

## 2023-06-01 MED ADMIN — aspirin chewable tablet 81 mg: 81 mg | ORAL | @ 14:00:00

## 2023-06-01 MED ADMIN — rOPINIRole (REQUIP) tablet 0.25 mg: .25 mg | ORAL | @ 04:00:00

## 2023-06-01 MED ADMIN — magnesium oxide (MAG-OX) tablet 800 mg: 800 mg | ORAL | @ 04:00:00 | Stop: 2023-05-31

## 2023-06-01 MED ADMIN — rifAXIMin (XIFAXAN) tablet 550 mg: 550 mg | ORAL | @ 14:00:00 | Stop: 2023-06-07

## 2023-06-01 MED ADMIN — pantoprazole (Protonix) EC tablet 20 mg: 20 mg | ORAL | @ 14:00:00

## 2023-06-01 MED ADMIN — atorvastatin (LIPITOR) tablet 20 mg: 20 mg | ORAL | @ 14:00:00

## 2023-06-01 MED ADMIN — rifAXIMin (XIFAXAN) tablet 550 mg: 550 mg | ORAL | @ 04:00:00 | Stop: 2023-06-07

## 2023-06-01 MED ADMIN — morphine injection 2 mg: 2 mg | INTRAVENOUS | @ 04:00:00 | Stop: 2023-06-01

## 2023-06-01 MED ADMIN — iohexol (OMNIPAQUE) 350 mg iodine/mL solution 75 mL: 75 mL | INTRAVENOUS | @ 03:00:00 | Stop: 2023-05-31

## 2023-06-01 MED ADMIN — lactulose oral solution: 20 g | ORAL | @ 14:00:00

## 2023-06-01 MED ADMIN — traZODone (DESYREL) tablet 50 mg: 50 mg | ORAL | @ 04:00:00

## 2023-06-01 NOTE — Unmapped (Signed)
ED Procedure Note    Paracentesis    Date/Time: 05/31/2023 5:50 PM    Performed by: Ripley Fraise, MD  Authorized by: Jorene Minors, MD    Consent:     Consent obtained:  Verbal    Consent given by:  Patient    Risks, benefits, and alternatives were discussed: yes      Risks discussed:  Bleeding, infection and pain  Universal protocol:     Procedure explained and questions answered to patient or proxy's satisfaction: yes      Patient identity confirmed:  Verbally with patient  Pre-procedure details:     Procedure purpose:  Diagnostic  Anesthesia:     Anesthesia method:  None (Offered the patient infiltration of lidocaine which she declined)  Procedure details:     Needle gauge:  22    Ultrasound guidance: yes      Puncture site:  R lower quadrant    Fluid removed amount:  20 ccs    Fluid appearance:  Yellow and clear  Post-procedure details:     Procedure completion:  Tolerated well, no immediate complications    Documentation assistance was provided by Sharol Given, Scribe, on May 31, 2023 at 5:53 PM for Marlyn Corporal, MD    May 31, 2023 11:47 PM. Documentation assistance provided by the scribe. I was present during the time the encounter was recorded. The information recorded by the scribe was done at my direction and has been reviewed and validated by me.

## 2023-06-01 NOTE — Unmapped (Signed)
Family Medicine Inpatient Service  History and Physical Note    Team: Orion Modest ([phone])  PCP: Marca Ancona, MD  Date of Admission: May 31, 2023  Code Status: full code  Emergency Contact: Ok Anis (Daughter) [phone]     ASSESSMENT / PLAN:   Denise York is a 57 y.o. 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     # MASH  # Hepatic encephalopathy   # Portal HTN   Patient with h/o MASH with no history of jaundice, ascites, or esophageal variceal bleeding. CT AP shows cirrhotic liver morphology with sequelae of portal hypertension including mild splenomegaly, small caliber upper abdominal varices, and large volume abdominopelvic ascites. Labs remarkable for AST 47, PT 15.1, Mag 1.5, bili 2.3 and albumin 2.7. Diagnostic para completed in ED with 96 nucleated cells; no concern for SBP.   - LVP tomorrow; Lovenox held   - Continue home Rifaximin 550 mg BID and titrate home Lactulose to 20g (oral sln) TID  - Continue home Coreg 3.125 mg BID   - Continue home Spironolactone 100 mg daily   - Continue home Lasix 40 mg daily     # Subsegmental pulmonary emboli   -  CT PE protocol    #Hypomagnesemia   - Repletion via Mag oxide 800 mg tonight   - Repeat Mag ordered for 11/24 AM       Chronic medical problems:     #Nonobstructive coronary artery disease   - Continue home Atorvastatin 20 mg daily   - Continue home Aspirin 81 mg daily     #Prediabetes   Glucose 106 on admission.   - Continue home Metformin 500 mg daily   - Pt refused Q4 glucose checks; discontinued for now as BG appears well controlled     #Depression  - Continue home Cymbalta 20 mg daily     #Restless leg syndrome   - Continue home Ropinirole 0.25 mg BID    #GERD   - Protonix 20 mg daily     #Sleep   - Continue home Trazodone 50 mg daily       # FEN/GI:  - IVF None  - Check electrolytes as indicated, replete as needed.  - Diet Low Sodium    # PPX:   - DVT: Holding Lovenox in prep for LVP tomorrow AM     # Dispo: Floor  [ ]  Anticipated Discharge Location: Home  [ ]  PT/OT/DME: No needs anticipated  [ ]  CM/SW needs: None anticipated  [ ]  Teaching: None anticipated    HISTORY OF PRESENT ILLNESS:  Denise York is a 57 y.o. female who presents with one week of worsening abdominal pain and distention. Patient states she has gained over 20 lbs of fluid in the past week. Associated with constipation and lower extremity swelling. States the abdominal pain is worse with movement. Has noticed small, watery stools but feel that this is likely due to constipation. Denied fever/chills, N/V, urinary symptoms, chest pain, SOB. No significant diet changes, no recent antibiotics/hospitalizations. No EtOH use. Patient states she has been taking Lasix, lactulose and rifaximin but has not been taking Coreg and Spironolactone. On chart review, it appears that patient should be taking Coreg and Spironolactone and monitor for hypotension and dizziness.     Patient  was seen at PCP office and noted to have large distended abdomen grossly tender to palpation. She was subsequently sent to ED.     She is followed by heatology for decompensated MASH cirrhosis which has been complicated by portal HTN and hepatic encephalopathy. Hepatic encephalopathy improved following splenorenal shunt embolization but still present. At that appointment she did not have evidence of jaundice, ascites, or known history of esophageal variceal bleeding. It appears there is consideration of living donor liver transplant.     ED Course: 57 y.o. 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 Abdomen is distended and diffusely moderately tender. Fluid wave present. Diagnostic paracentesis completed.     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    Obesity     Prediabetes      Past Surgical History:   Procedure Laterality Date    ABDOMINAL WALL MESH  REMOVAL  1997, 2005    Placement, 1997, removal and replacement 2005    BLADDER SURGERY      bladder tact X2    BREAST BIOPSY Right 04/2013    benign    BREAST EXCISIONAL BIOPSY Right 05/2013    benign    BREAST SURGERY Right     precancerous spot removed    CHOLECYSTECTOMY      HYSTERECTOMY      IR EMBOLIZATION VENOUS OTHER THAN HEMORRHAGE  02/17/2023    IR EMBOLIZATION VENOUS OTHER THAN HEMORRHAGE 02/17/2023 Ammie Dalton, MD IMG VIR [facility]    PR COLSC FLX W/RMVL OF TUMOR POLYP LESION SNARE TQ N/A 10/14/2016    Procedure: COLONOSCOPY FLEX; W/REMOV TUMOR/LES BY SNARE;  Surgeon: Alfred Levins, MD;  Location: [facility] PROCEDURES [place];  Service: Gastroenterology    PR COLSC FLX W/RMVL OF TUMOR POLYP LESION SNARE TQ N/A 01/15/2023    Procedure: COLONOSCOPY FLEX; W/REMOV TUMOR/LES BY SNARE;  Surgeon: Mickie Hillier, MD;  Location: [facility];  Service: Gastroenterology    PR PERC CLOS,CONG INTERATRIAL COMMUN W/IMPL N/A 09/12/2022    Procedure: ASD closure;  Surgeon: Jacquelyne Balint, MD;  Location: [facility] CATH;  Service: Cardiology    PR UPPER GI ENDOSCOPY,BIOPSY N/A 01/15/2023    Procedure: UGI ENDOSCOPY; WITH BIOPSY, SINGLE OR MULTIPLE;  Surgeon: Mickie Hillier, MD;  Location: [facility];  Service: Gastroenterology    SKIN BIOPSY         FAMILY HX:   Family History   Problem Relation Age of Onset    Heart disease Mother Hypertension Mother     Heart disease Father     Kidney disease Father     Allergies Father     Gout Father     Allergies Other         FAMILY H/O    Coronary artery disease Other         FAMILY H/O    Kidney failure Other         FAMILY H/O    No  Known Problems Sister     No Known Problems Daughter     No Known Problems Maternal Grandmother     No Known Problems Maternal Grandfather     No Known Problems Paternal Grandmother     No Known Problems Paternal Grandfather     BRCA 1/2 Neg Hx     Breast cancer Neg Hx     Cancer Neg Hx     Colon cancer Neg Hx     Endometrial cancer Neg Hx     Ovarian cancer Neg Hx     Substance Abuse Disorder Neg Hx     Mental illness Neg Hx        SOCIAL HX:   Social History     Socioeconomic History    Marital status: Married     Spouse name: None    Number of children: None    Years of education: None    Highest education level: None   Tobacco Use    Smoking status: Never     Passive exposure: Past    Smokeless tobacco: Never   Vaping Use    Vaping status: Never Used   Substance and Sexual Activity    Alcohol use: No    Drug use: No    Sexual activity: Not Currently   Other Topics Concern    Do you use sunscreen? Yes    Excessive sun exposure? Yes   Social History Narrative    2 births. No living children. Married new husband 2014.         02/21/2017        [facility] Components:        Family, social, cultural characteristics: social support includes best friend .      Patient has the following communication needs: none, per patient     Health Literacy: How confident are you that you understand your health issues/concerns, can participate in your care, and manage your care along with your physician: confident.    Behaviors Affecting Health: none, per patient    Family history of mental health illness and/or substance abuse: asked patient/parent and none disclosed.    Have you been seen by any medical provider that we have not referred you to since your last visit ? No    Discussed a Living Will with the patient and MWN:[id number] is under the age of 70.      Social Drivers of Health     Food Insecurity: Patient Declined (12/19/2022)    Hunger Vital Sign     Worried About Running Out of Food in the Last Year: Patient declined     Ran Out of Food in the Last Year: Patient declined       MEDICATIONS / ALLERGIES:  (Not in a hospital admission)      Allergies   Allergen Reactions    Penicillins Hives    Shellfish Containing Products Anaphylaxis    Glipizide Headache    Lisinopril Cough       IMMUNIZATIONS:  Immunization History   Administered Date(s) Administered    COVID-19 VAC,MRNA,TRIS(12Y UP)(PFIZER)(GRAY CAP) 08/19/2020    COVID-19 VACC,MRNA,(PFIZER)(PF) 10/02/2019, 10/23/2019    Covid-19 Vac, (46yr+) (Comirnaty) [employer]  04/08/2022    HEPATITIS B VACCINE ADULT, ADJUVANTED, IM(HEPLISAV B) 06/17/2022, 09/26/2022    Hepatitis A (Adult) 06/17/2022, 09/26/2022    INFLUENZA INJ MDCK PF, QUAD,(FLUCELVAX)(25MO AND UP EGG FREE) 05/15/2021    INFLUENZA TIV (TRI) PF (IM)(HISTORICAL) 04/09/2011    Influenza TRI (IIV3) 5+yrs MDV 06/04/2013  Influenza Vaccine Quad(IM)6 MO-Adult(PF) 05/08/2017, 04/09/2018, 03/11/2019    Influenza Virus Vaccine, unspecified formulation 04/08/2015, 04/07/2016, 05/04/2020, 04/08/2022    PNEUMOCOCCAL POLYSACCHARIDE 23-VALENT 07/19/2014    Pneumococcal Conjugate 20-valent 06/17/2022    SHINGRIX-ZOSTER VACCINE (HZV),RECOMBINANT,ADJUVANTED(IM) 11/09/2020, 05/15/2021    TdaP 07/16/2011, 06/17/2022       REVIEW OF SYSTEMS:  Pertinent positives and negatives per HPI. A complete review of systems otherwise negative.    PHYSICAL EXAM:    Initial ED Vitals:   ED Triage Vitals [05/31/23 1327]   Enc Vitals Group      BP 140/69      Heart Rate 89      SpO2 Pulse       Resp 16      Temp 36.7 ??C (98 ??F)      Temp Source Oral      SpO2 99 %      Weight (!) 101.6 kg (224 lb)      Height       Head Circumference       Peak Flow       Pain Score       Pain Loc       Pain Education       Exclude from Growth Chart        Recent Vitals:  Vitals:    05/31/23 1806   BP: 119/55   Pulse: 91   Resp: 18   Temp: 36.7 ??C (98.1 ??F)   SpO2: 100%       GEN: Well-appearing, lying in bed, in mild distress from pain   Eyes: PERRL. No scleral icterus. Conjunctiva non-erythematous. EOMI.  HEENT: NCAT, MMM. Oropharynx clear.  Neck: Supple.  Lymphadenopathy: No cervical or supraclavicular LAD.  CV: Regular rate and rhythm. No murmurs/rubs/gallops. No costochondral tenderness. No cyanosis or clubbing. Cap Refill < 2 secs  Pulm: CTAB. No wheezing, crackles, or rhonchi.  Abd: Distended. Exquisitely tender to palpation. Fluid wave present.   Neuro: A&O x 3. No focal deficits. Moving all extremities spontaneously.   Ext: No peripheral edema.  Palpable distal pulses.  Skin: No rashes or skin lesions.        LABS/ STUDIES:  All imaging, laboratory studies, and other pertinent tests including electrocardiography were reviewed prior to admission and are summarized within the assessment and plan.     Maxwell Caul, MD,  PGY1  May 31, 2023 8:16 PM

## 2023-06-01 NOTE — Unmapped (Signed)
Family Medicine Inpatient Service  Progress Note    Team: Family Medicine [facility] (pgr [phone])    Hospital Day: 1    ASSESSMENT / PLAN:   Denise York is a 57 y.o. 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      #MASH - Hepatic encephalopathy - Portal HTN   Patient with h/o MASH with no history of jaundice, ascites, or esophageal variceal bleeding. CT AP shows cirrhotic liver morphology with sequelae of portal hypertension including mild splenomegaly, small caliber upper abdominal varices, and large volume abdominopelvic ascites. Labs remarkable for AST 47, PT 15.1, Mag 1.5, bili 2.3 and albumin 2.7. Diagnostic para completed in ED with 96 nucleated cells; no concern for SBP. On POCUS today there is not a large pocket of fluid for LVP and on further interview she has not been taking diuretics as ordered. Will defer LVP and give IV lasix 40mg  plus restart spironolactone 100mg  PO and monitor response.   - Continue home Rifaximin 550 mg BID and titrate home Lactulose to 20g (oral sln) TID  - Continue home Coreg 3.125 mg BID   - RESTART home Spironolactone 100 mg daily  - Trial IV lasix 40mg , consider redosing later  - Continue home Lasix 40 mg daily   - Strict I/Os and daily weights  - STOP cymbalta    #Thrombocytopenia  Plts 51 on admission. Likely from above. Will CTM  - hold DVT prophylaxis given plts <50    #Hypomagnesemia   - mg 1.5 on admission.  - s/p Repletion via Mag oxide 800 mg  - Repeat Mag     #Nonobstructive coronary artery disease   - Continue home Atorvastatin 20 mg daily   - Continue home Aspirin 81 mg daily      #Diabetes  Last A1c 7.7 8 months ago, will recheck here. Glucose 106 on admission. Pt refused Q4 glucose checks; discontinued for now as BG appears well controlled.   - Continue home Metformin 500 mg daily      # C/f Subsegmental pulmonary emboli (resolved)  - visualized defects in subsegmental pulmonary arteries on CTAP, no embolisms noted on dedicated CT Chest.        Chronic medical problems:    #Depression: STOP Cymbalta 20 mg daily, will discuss alternatives   #Restless leg syndrome: Continue home Ropinirole 0.25 mg BID   #GERD: Protonix 20 mg daily    #Sleep: Continue home Trazodone 50 mg daily      # Checklist:  - IVF None  - Daily labs needed: CBC, Magnesium, and CMP  - Diet Regular  - Bowel Regimen: lactulose  - DVT: SQ Lovenox  - Code Status:   Orders Placed This Encounter   Procedures    Full Code     Standing Status:   Standing     Number of Occurrences:   1     - Dispo: Floor    [ ]  Anticipated Discharge Location: Home  [ ]  PT/OT/DME: PT/OT ordered  [ ]  CM/SW needs: None anticipated  [ ]  Follow up appt: Appointment needed    SUBJECTIVE:  Interval events: tight abdomen. Has not had BM      REVIEW OF SYSTEMS:  Pertinent positives and negatives per HPI. A complete review of systems otherwise negative.  PHYSICAL EXAM:    No intake or output data in the 24 hours ending 06/01/23 0600    Recent Vitals:  Vitals:    05/31/23 2232   BP: 134/69   Pulse: 87   Resp:    Temp:    SpO2:        GEN: Well-appearing, lying in bed, in mild distress from pain   Eyes: PERRL. No scleral icterus. Conjunctiva non-erythematous. EOMI.  HEENT: NCAT, MMM. Oropharynx clear.  Neck: Supple.  Lymphadenopathy: No cervical or supraclavicular LAD.  CV: Regular rate and rhythm. No murmurs/rubs/gallops. No costochondral tenderness. No cyanosis or clubbing. Cap Refill < 2 secs  Pulm: CTAB. No wheezing, crackles, or rhonchi.  Abd: Distended. Exquisitely tender to palpation. Fluid wave present.   Neuro: A&O x 3. No focal deficits. Moving all extremities spontaneously.   Ext: No peripheral edema.  Palpable distal pulses.  Skin: No rashes or skin lesions.        LABS/ STUDIES:    All imaging, laboratory studies, and other pertinent tests including electrocardiography within the last 24 hours were reviewed and are summarized within the assessment and plan.     NUTRITION:                             Zollie Beckers, MD,  PGY2  June 01, 2023 6:00 AM

## 2023-06-01 NOTE — Unmapped (Signed)
Pt is A/O x 4, ambulatory and independent with ADLs, endorses an abd pain of 8/10 declined intervention, states she was medicated prior to coming to the unit, admission assessment completed, pt has been oriented to the room, and encouraged to call for assistance with needs, ACHS orders discontinued, IV flushed and patent, resting well at this time, pain management overnight, will continue to monitor.   Problem: Adult Inpatient Plan of Care  Goal: Plan of Care Review  Outcome: Ongoing - Unchanged  Goal: Patient-Specific Goal (Individualized)  Outcome: Ongoing - Unchanged  Goal: Absence of Hospital-Acquired Illness or Injury  Outcome: Ongoing - Unchanged  Intervention: Identify and Manage Fall Risk  Recent Flowsheet Documentation  Taken 06/01/2023 0400 by Oletta Cohn, RN  Safety Interventions:   fall reduction program maintained   lighting adjusted for tasks/safety   low bed  Taken 06/01/2023 0200 by Oletta Cohn, RN  Safety Interventions:   fall reduction program maintained   lighting adjusted for tasks/safety   low bed  Taken 06/01/2023 0000 by Oletta Cohn, RN  Safety Interventions:   fall reduction program maintained   lighting adjusted for tasks/safety   low bed  Taken 05/31/2023 2130 by Oletta Cohn, RN  Safety Interventions:   fall reduction program maintained   lighting adjusted for tasks/safety   low bed  Goal: Optimal Comfort and Wellbeing  Outcome: Ongoing - Unchanged  Goal: Readiness for Transition of Care  Outcome: Ongoing - Unchanged  Goal: Rounds/Family Conference  Outcome: Ongoing - Unchanged

## 2023-06-01 NOTE — Unmapped (Addendum)
Denise York is a 57 y.o. 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      #MASH - Hepatic encephalopathy - Portal HTN   Patient with h/o MASH with no history of jaundice, ascites, or esophageal variceal bleeding. CT AP shows cirrhotic liver morphology with sequelae of portal hypertension including mild splenomegaly, small caliber upper abdominal varices, and large volume abdominopelvic ascites. Labs remarkable for AST 47, PT 15.1, Mag 1.5, bili 2.3 and albumin 2.7. Diagnostic para completed in ED with 96 nucleated cells; no concern for SBP. Deferred LVP given she has not been on an effective diuresis regimen. Restarted spironolactone at 50mg  daily and lasix 80mg  with adequate response and improvement in abdominal pain. Titrated rifaxamin and lactulose to have 3-5BM per day. Will need continued titration of spironolactone as blood pressure will tolerate.    #Thrombocytopenia  Plts ranged 39-51 during admission. Stopped her home ASA and did not give medication based DVT prophylaxis.     #Hypomagnesemia (resolved)  Initially Mg 1.5, improved with repletion.     #Nonobstructive coronary artery disease   Continued home Atorvastatin 20 mg and held Aspirin 81 mg daily.     #Diabetes  Glucose 106 on admission, well controlled on Metformin 500 mg daily, continued while admitted. Updated A1c 5.2%.     # C/f Subsegmental pulmonary emboli (resolved)  Visualized defects in subsegmental pulmonary arteries on CTAP, no embolisms noted on dedicated CT Chest.      #Depression   Stopped Cymblata started lexparo 10mg  daily, will likely need increase

## 2023-06-02 LAB — BASIC METABOLIC PANEL
ANION GAP: 7 mmol/L (ref 5–14)
BLOOD UREA NITROGEN: 8 mg/dL — ABNORMAL LOW (ref 9–23)
BUN / CREAT RATIO: 9
CALCIUM: 9.1 mg/dL (ref 8.7–10.4)
CHLORIDE: 99 mmol/L (ref 98–107)
CO2: 35.2 mmol/L — ABNORMAL HIGH (ref 20.0–31.0)
CREATININE: 0.91 mg/dL (ref 0.55–1.02)
EGFR CKD-EPI (2021) FEMALE: 74 mL/min/{1.73_m2} (ref >=60)
GLUCOSE RANDOM: 118 mg/dL (ref 70–179)
POTASSIUM: 3.5 mmol/L (ref 3.4–4.8)
SODIUM: 141 mmol/L (ref 135–145)

## 2023-06-02 LAB — COMPREHENSIVE METABOLIC PANEL
ALBUMIN: 2.7 g/dL — ABNORMAL LOW (ref 3.4–5.0)
ALKALINE PHOSPHATASE: 89 U/L (ref 46–116)
ALT (SGPT): 26 U/L (ref 10–49)
ANION GAP: 11 mmol/L (ref 5–14)
AST (SGOT): 50 U/L — ABNORMAL HIGH (ref <=34)
BILIRUBIN TOTAL: 2.4 mg/dL — ABNORMAL HIGH (ref 0.3–1.2)
BLOOD UREA NITROGEN: 8 mg/dL — ABNORMAL LOW (ref 9–23)
BUN / CREAT RATIO: 8
CALCIUM: 9.7 mg/dL (ref 8.7–10.4)
CHLORIDE: 102 mmol/L (ref 98–107)
CO2: 31.3 mmol/L — ABNORMAL HIGH (ref 20.0–31.0)
CREATININE: 0.97 mg/dL (ref 0.55–1.02)
EGFR CKD-EPI (2021) FEMALE: 68 mL/min/{1.73_m2} (ref >=60)
GLUCOSE RANDOM: 116 mg/dL (ref 70–179)
POTASSIUM: 4 mmol/L (ref 3.4–4.8)
PROTEIN TOTAL: 6.2 g/dL (ref 5.7–8.2)
SODIUM: 144 mmol/L (ref 135–145)

## 2023-06-02 LAB — CBC W/ AUTO DIFF
BASOPHILS ABSOLUTE COUNT: 0 10*9/L (ref 0.0–0.1)
BASOPHILS RELATIVE PERCENT: 0.7 %
EOSINOPHILS ABSOLUTE COUNT: 0.2 10*9/L (ref 0.0–0.5)
EOSINOPHILS RELATIVE PERCENT: 3.6 %
HEMATOCRIT: 32.2 % — ABNORMAL LOW (ref 34.0–44.0)
HEMOGLOBIN: 11.1 g/dL — ABNORMAL LOW (ref 11.3–14.9)
LYMPHOCYTES ABSOLUTE COUNT: 1.3 10*9/L (ref 1.1–3.6)
LYMPHOCYTES RELATIVE PERCENT: 24.8 %
MEAN CORPUSCULAR HEMOGLOBIN CONC: 34.6 g/dL (ref 32.0–36.0)
MEAN CORPUSCULAR HEMOGLOBIN: 30.1 pg (ref 25.9–32.4)
MEAN CORPUSCULAR VOLUME: 87.1 fL (ref 77.6–95.7)
MEAN PLATELET VOLUME: 10.2 fL (ref 6.8–10.7)
MONOCYTES ABSOLUTE COUNT: 0.7 10*9/L (ref 0.3–0.8)
MONOCYTES RELATIVE PERCENT: 12.8 %
NEUTROPHILS ABSOLUTE COUNT: 3.2 10*9/L (ref 1.8–7.8)
NEUTROPHILS RELATIVE PERCENT: 58.1 %
NUCLEATED RED BLOOD CELLS: 0 /100{WBCs} (ref <=4)
PLATELET COUNT: 51 10*9/L — ABNORMAL LOW (ref 150–450)
RED BLOOD CELL COUNT: 3.69 10*12/L — ABNORMAL LOW (ref 3.95–5.13)
RED CELL DISTRIBUTION WIDTH: 15.1 % (ref 12.2–15.2)
WBC ADJUSTED: 5.4 10*9/L (ref 3.6–11.2)

## 2023-06-02 LAB — PROTIME-INR
INR: 1.37
PROTIME: 15.6 s — ABNORMAL HIGH (ref 9.9–12.6)

## 2023-06-02 LAB — MAGNESIUM: MAGNESIUM: 1.6 mg/dL (ref 1.6–2.6)

## 2023-06-02 MED ORDER — FUROSEMIDE 80 MG TABLET
ORAL_TABLET | Freq: Once | ORAL | 0 refills | 1 days
Start: 2023-06-02 — End: 2023-06-02

## 2023-06-02 MED ORDER — LACTULOSE 20 GRAM/30 ML ORAL SOLUTION
Freq: Three times a day (TID) | ORAL | 0 refills | 30 days
Start: 2023-06-02 — End: 2023-07-02

## 2023-06-02 MED ORDER — CARVEDILOL 3.125 MG TABLET
ORAL_TABLET | Freq: Two times a day (BID) | ORAL | 0 refills | 30 days
Start: 2023-06-02 — End: 2023-07-02

## 2023-06-02 MED ADMIN — lactulose oral solution: 20 g | ORAL | @ 01:00:00

## 2023-06-02 MED ADMIN — rifAXIMin (XIFAXAN) tablet 550 mg: 550 mg | ORAL | @ 01:00:00 | Stop: 2023-06-07

## 2023-06-02 MED ADMIN — spironolactone (ALDACTONE) tablet 50 mg: 50 mg | ORAL | @ 14:00:00

## 2023-06-02 MED ADMIN — rOPINIRole (REQUIP) tablet 0.25 mg: .25 mg | ORAL | @ 01:00:00

## 2023-06-02 MED ADMIN — furosemide (LASIX) tablet 80 mg: 80 mg | ORAL | @ 14:00:00 | Stop: 2023-06-02

## 2023-06-02 MED ADMIN — magnesium oxide (MAG-OX) tablet 800 mg: 800 mg | ORAL | @ 14:00:00

## 2023-06-02 MED ADMIN — carvedilol (COREG) tablet 3.125 mg: 3.125 mg | ORAL | @ 14:00:00

## 2023-06-02 MED ADMIN — lactulose oral solution: 20 g | ORAL | @ 19:00:00

## 2023-06-02 MED ADMIN — lactulose oral solution: 20 g | ORAL | @ 14:00:00

## 2023-06-02 MED ADMIN — furosemide (LASIX) tablet 80 mg: 80 mg | ORAL | @ 22:00:00 | Stop: 2023-06-02

## 2023-06-02 MED ADMIN — atorvastatin (LIPITOR) tablet 20 mg: 20 mg | ORAL | @ 14:00:00

## 2023-06-02 MED ADMIN — morphine injection 2 mg: 2 mg | INTRAVENOUS | @ 01:00:00 | Stop: 2023-06-01

## 2023-06-02 MED ADMIN — acetaminophen (TYLENOL) tablet 650 mg: 650 mg | ORAL | @ 19:00:00 | Stop: 2023-06-02

## 2023-06-02 MED ADMIN — escitalopram oxalate (LEXAPRO) tablet 10 mg: 10 mg | ORAL | @ 19:00:00

## 2023-06-02 MED ADMIN — rOPINIRole (REQUIP) tablet 0.25 mg: .25 mg | ORAL | @ 14:00:00

## 2023-06-02 MED ADMIN — potassium chloride ER tablet 20 mEq: 20 meq | ORAL | @ 22:00:00 | Stop: 2023-06-02

## 2023-06-02 MED ADMIN — rifAXIMin (XIFAXAN) tablet 550 mg: 550 mg | ORAL | @ 14:00:00 | Stop: 2023-06-07

## 2023-06-02 MED ADMIN — traZODone (DESYREL) tablet 50 mg: 50 mg | ORAL | @ 01:00:00

## 2023-06-02 MED ADMIN — pantoprazole (Protonix) EC tablet 20 mg: 20 mg | ORAL | @ 14:00:00

## 2023-06-02 NOTE — Unmapped (Signed)
Physician Discharge Summary [facility]  1 [facility]  [address]  [place] [address]  Dept: [phone]  Loc: [phone]     Identifying Information:   Denise York  1966/01/05  [id number]    Primary Care Physician: Marca Ancona, MD   Code Status: Full Code    Admit Date: 05/31/2023    Discharge Date: 06/03/2023     Discharge To: Home    Discharge Service: [facility] - [facility] [place] [facility]     Discharge Attending Physician: Lorane Gell, MD    Discharge Diagnoses:  Principal Problem:    Fatty liver disease, nonalcoholic  Active Problems:    Insomnia    Restless leg syndrome    Depression    Portal hypertension (CMS-HCC)    Hepatic encephalopathy (CMS-HCC)    Thrombocytopenia (CMS-HCC)    Nonobstructive atherosclerosis of coronary artery    Ascites of liver      Outpatient Provider Follow Up Issues:   [ ]  change from Cymbalta to Lexapro  [ ]  volume status on lasix 80mg  daily  [ ]  Monitor K on new lasix and spironolactone  [ ]  BP monitoring, could increase spiro if tolerating  [ ]  Bowel movements on lactulose and Rifaximin   [ ]  set up mail order for prescriptions   [ ]  CBC for thrombocytopenia     Hospital Course:   Denise York is a 57 y.o. 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      #MASH - Hepatic encephalopathy - Portal HTN   Patient with h/o MASH with no history of jaundice, ascites, or esophageal variceal bleeding. CT AP shows cirrhotic liver morphology with sequelae of portal hypertension including mild splenomegaly, small caliber upper abdominal varices, and large volume abdominopelvic ascites. Labs remarkable for AST 47, PT 15.1, Mag 1.5, bili 2.3 and albumin 2.7. Diagnostic para completed in ED with 96 nucleated cells; no concern for SBP. Deferred LVP given she has not been on an effective diuresis regimen. Restarted spironolactone at 50mg  daily and lasix 80mg  with adequate response and improvement in abdominal pain. Titrated rifaxamin and lactulose to have 3-5BM per day. Will need continued titration of spironolactone as blood pressure will tolerate.    #Thrombocytopenia  Plts ranged 39-51 during admission. Stopped her home ASA and did not give medication based DVT prophylaxis.     #Hypomagnesemia (resolved)  Initially Mg 1.5, improved with repletion.     #Nonobstructive coronary artery disease   Continued home Atorvastatin 20 mg and held Aspirin 81 mg daily.     #Diabetes  Glucose 106 on admission, well controlled on Metformin 500 mg daily, continued while admitted. Updated A1c 5.2%.     # C/f Subsegmental pulmonary emboli (resolved)  Visualized defects in subsegmental pulmonary arteries on CTAP, no embolisms noted on dedicated CT Chest.      #Depression   Stopped Cymblata started lexparo 10mg  daily, will likely need increase    Touchbase with Outpatient Provider:  Warm Handoff: Completed on 06/03/23 by Zollie Beckers, MD  (Resident) via [facility] Message    Procedures:  None  No admission procedures for hospital encounter.  ______________________________________________________________________  Discharge Medications:     Your Medication List        STOP taking these medications  aspirin 81 MG tablet  Commonly known as: ECOTRIN     DULoxetine 20 MG capsule  Commonly known as: CYMBALTA            START taking these medications      carvedilol 3.125 MG tablet  Commonly known as: COREG  Take 1 tablet (3.125 mg total) by mouth two (2) times a day.     escitalopram oxalate 10 MG tablet  Commonly known as: LEXAPRO  Take 1 tablet (10 mg total) by mouth daily.     lactulose 20 gram/30 mL Soln  Take 30 mL (20 g total) by mouth Three (3) times a day.            CHANGE how you take these medications      cholecalciferol (vitamin D3-50 mcg (2,000 unit)) 50 mcg (2,000 unit) Cap  Take 2 capsules (4,000 Units total) by mouth daily.  What changed: how much to take     furosemide 80 MG tablet  Commonly known as: LASIX  Take 1 tablet (80 mg total) by mouth daily.  What changed:   medication strength  how much to take     spironolactone 50 MG tablet  Commonly known as: ALDACTONE  Take 1 tablet (50 mg total) by mouth daily.  What changed:   medication strength  how much to take            CONTINUE taking these medications      AEROCHAMBER MV inhaler  Generic drug: inhalational spacing device  Use as directed with inhalers     atorvastatin 20 MG tablet  Commonly known as: LIPITOR  Take 1 tablet (20 mg total) by mouth daily.     blood-glucose meter kit  Use to test blood sugar DAILY     FIBER ORAL  Take 4 capsules by mouth daily. BJs brand.     glucose blood test strip  Generic drug: blood sugar diagnostic  Use to test blood sugar daily     lancets Misc  Use to test blood sugar daily     loratadine 10 mg tablet  Commonly known as: CLARITIN  TAKE 1 TABLET BY MOUTH EVERY DAY     magnesium oxide 400 mg (241.3 mg elemental) tablet  Commonly known as: MAG-OX  Take 1 tablet (400 mg total) by mouth daily.     metFORMIN 500 MG 24 hr tablet  Commonly known as: GLUCOPHAGE-XR  TAKE 1 TABLET BY MOUTH DAILY WITH EVENING MEAL.     multivitamin per tablet  Commonly known as: TAB-A-VITE/THERAGRAN  Take 1 tablet by mouth daily.     omeprazole 20 MG capsule  Commonly known as: PriLOSEC  TAKE 1 CAPSULE (20 MG TOTAL) BY MOUTH TWO (2) TIMES A DAY (30 MINUTES BEFORE A MEAL).     OZEMPIC 2 mg/dose (8 mg/3 mL) Pnij  Generic drug: semaglutide  Inject 2 mg under the skin every seven (7) days.     rOPINIRole 0.25 MG tablet  Commonly known as: REQUIP  TAKE 1 TABLET (0.25 MG TOTAL) BY MOUTH TWO (2) TIMES A DAY.     traZODone 50 MG tablet  Commonly known as: DESYREL  Take 1 tablet (50 mg total) by mouth nightly.     XIFAXAN 550 mg Tab  Generic drug: rifAXIMin  Take 1 tablet (550 mg total) by mouth two (2) times a day. Allergies:  Penicillins, Shellfish containing products, Glipizide, and Lisinopril  ______________________________________________________________________  Pending Test Results (if blank, then none):  Pending  Labs       Order Current Status    Cytology - Fluid In process    Ascitic/Peritoneal Fluid Culture Preliminary result    Urine Culture Preliminary result            Most Recent Labs:  All lab results last 24 hours -   Recent Results (from the past 24 hours)   Basic Metabolic Panel    Collection Time: 06/02/23  1:53 PM   Result Value Ref Range    Sodium 141 135 - 145 mmol/L    Potassium 3.5 3.4 - 4.8 mmol/L    Chloride 99 98 - 107 mmol/L    CO2 35.2 (H) 20.0 - 31.0 mmol/L    Anion Gap 7 5 - 14 mmol/L    BUN 8 (L) 9 - 23 mg/dL    Creatinine 4.13 2.44 - 1.02 mg/dL    BUN/Creatinine Ratio 9     eGFR CKD-EPI (2021) Female 74 >=60 mL/min/1.57m2    Glucose 118 70 - 179 mg/dL    Calcium 9.1 8.7 - 01.0 mg/dL   Comprehensive Metabolic Panel    Collection Time: 06/03/23  7:59 AM   Result Value Ref Range    Sodium 145 135 - 145 mmol/L    Potassium 4.0 3.4 - 4.8 mmol/L    Chloride 102 98 - 107 mmol/L    CO2 34.7 (H) 20.0 - 31.0 mmol/L    Anion Gap 8 5 - 14 mmol/L    BUN 10 9 - 23 mg/dL    Creatinine 2.72 (H) 0.55 - 1.02 mg/dL    BUN/Creatinine Ratio 10     eGFR CKD-EPI (2021) Female 62 >=60 mL/min/1.72m2    Glucose 105 70 - 179 mg/dL    Calcium 8.9 8.7 - 53.6 mg/dL    Albumin 2.3 (L) 3.4 - 5.0 g/dL    Total Protein 5.5 (L) 5.7 - 8.2 g/dL    Total Bilirubin 1.8 (H) 0.3 - 1.2 mg/dL    AST 44 (H) <=64 U/L    ALT 21 10 - 49 U/L    Alkaline Phosphatase 79 46 - 116 U/L   Magnesium Level    Collection Time: 06/03/23  7:59 AM   Result Value Ref Range    Magnesium 1.6 1.6 - 2.6 mg/dL   CBC w/ Differential    Collection Time: 06/03/23  7:59 AM   Result Value Ref Range    WBC 3.7 3.6 - 11.2 10*9/L    RBC 3.29 (L) 3.95 - 5.13 10*12/L    HGB 9.9 (L) 11.3 - 14.9 g/dL    HCT 40.3 (L) 47.4 - 44.0 %    MCV 87.7 77.6 - 95.7 fL MCH 30.1 25.9 - 32.4 pg    MCHC 34.4 32.0 - 36.0 g/dL    RDW 25.9 (H) 56.3 - 15.2 %    MPV 11.1 (H) 6.8 - 10.7 fL    Platelet 39 (L) 150 - 450 10*9/L    nRBC 0 <=4 /100 WBCs    Neutrophils % 56.5 %    Lymphocytes % 26.1 %    Monocytes % 13.1 %    Eosinophils % 3.3 %    Basophils % 1.0 %    Absolute Neutrophils 2.1 1.8 - 7.8 10*9/L    Absolute Lymphocytes 1.0 (L) 1.1 - 3.6 10*9/L    Absolute Monocytes 0.5 0.3 - 0.8 10*9/L    Absolute Eosinophils 0.1 0.0 - 0.5 10*9/L    Absolute Basophils 0.0 0.0 - 0.1 10*9/L  Relevant Studies/Radiology (if blank, then none):  CTA Chest W Contrast  Result Date: 05/31/2023  EXAM: CTA CHEST W CONTRAST ACCESSION: [id number] UN CLINICAL INDICATION: Concern for PE TECHNIQUE: Contiguous axial images were reconstructed through the chest following a single breath hold helical acquisition during the administration of intravenous contrast material. Images were reformatted in the coronal and sagittal planes. MIP slabs were also constructed. COMPARISON: Same day CT abdomen pelvis. FINDINGS: PULMONARY ARTERIES: No emboli in either lung. Main pulmonary artery is normal in size. HEART AND VASCULATURE: Cardiac chambers are normal in size post Amplatz ASD closure. Mild coronary artery calcifications. No pericardial effusion. Aorta is normal in caliber. LUNGS, AIRWAYS, AND PLEURA: Lungs are clear. Central airways are patent. No pleural effusion or pneumothorax. MEDIASTINUM AND LYMPH NODES: No mediastinal or hilar lymphadenopathy. No mediastinal mass or other abnormality. CHEST WALL AND BONES: No suspicious lytic or sclerotic osseous lesions. Multilevel degenerative disc disease in the thoracic spine. Chest wall appears normal. UPPER ABDOMEN: Reported separately. OTHER: Thyroid appears normal. No supraclavicular or axillary lymphadenopathy.     No acute pulmonary embolism or other acute findings.     CT Abdomen Pelvis W IV Contrast Only  Result Date: 05/31/2023  EXAM: CT ABDOMEN PELVIS W CONTRAST ACCESSION: [id number] UN CLINICAL INDICATION: 57 years old with Abd pain, distension  COMPARISON: CT abdomen pelvis 12/22/2022 TECHNIQUE: A helical CT scan of the abdomen and pelvis was obtained following IV contrast from the lung bases through the pubic symphysis. Images were reconstructed in the axial plane. Coronal and sagittal reformatted images were also provided for further evaluation. FINDINGS: LOWER CHEST: There are questionable central hypoattenuating filling defects noted within what appear to be subsegmental branches of the pulmonary artery supplying the dependent aspects of both lower lobes (see arrows on series 2, images 1 and 4). These findings are nonspecific and are suboptimally evaluated due to contrast bolus timing and limited field-of-view. Differential considerations include both mixing artifact versus subsegmental pulmonary emboli. LIVER: The liver is nodular in contour with associated left hepatic lobe hypertrophy, in keeping with reported history of cirrhosis. No obvious focal hepatic lesion is identified, however, BILIARY: The gallbladder is surgically absent.  No biliary ductal dilatation.  SPLEEN: Mild splenomegaly. Small splenule. Moderate perisplenic varices. Adjacent splenule. PANCREAS: Normal pancreatic contour.  No focal lesions.  No ductal dilation. ADRENAL GLANDS: Normal appearance of the adrenal glands. KIDNEYS/URETERS: Symmetric renal enhancement. Right interpolar renal cortical defect. Punctate nonobstructing left renal stone. No hydronephrosis.  No solid renal mass. BLADDER: Unremarkable. REPRODUCTIVE ORGANS: Uterus is surgically absent. No adnexal mass. GI TRACT: There is no evidence of bowel obstruction. Appendix is unremarkable. Mild apparent circumferential wall thickening is seen involving the cecum through ascending colon (2:51). This is a nonspecific finding and may be accentuated by some component of underdistention, however, similar findings could also be seen in the setting of colitis or portal colopathy (given the above). Mild volume colonic stool burden. PERITONEUM, RETROPERITONEUM AND MESENTERY: No intraperitoneal free air. Diffuse mesenteric edema with large volume abdominopelvic ascites, likely related to the above. LYMPH NODES: No lymphadenopathy by size criteria. VESSELS: Embolization coils are noted anterior to the liver with associated metallic streak artifact somewhat limiting evaluation of the adjacent structures. Numerous small caliber upper abdominal and periesophageal varices are noted. BONES and SOFT TISSUES: Diffuse subcutaneous/body wall edema. No aggressive osseous lesions.      1.  No acute abnormality is identified throughout the abdomen or pelvis to explain etiology of patient's reported abdominal pain. 2.  Several rounded  filling defects are seen within what appear to be subsegmental pulmonary arteries within the dependent aspects of both lower lobes (series 2, images 1 and 4). These findings are suboptimally evaluated due to limited field-of-view and contrast bolus timing on today's exam, however, could potentially reflect artifact versus several subsegmental pulmonary emboli. Further evaluation with dedicated CT PE protocol is recommended for more definitive characterization 3.  Cirrhotic liver morphology with sequelae of portal hypertension including mild splenomegaly, small caliber upper abdominal varices, and large volume abdominopelvic ascites (as described). No obvious focal hepatic lesion is identified within the limitations of single phase images obtained. 4.  Other chronic or incidental findings, as described within the body of the report. These findings were discussed via telephone with the Dr. Herbie Drape, MD by Dr. Eugene Garnet on 05/31/2023 at 5:40 p.m.    ECG 12 Lead  Result Date: 05/31/2023  NORMAL SINUS RHYTHM NORMAL ECG WHEN COMPARED WITH ECG OF 13-Sep-2022 07:28, T WAVE VOLTAGE HAS DECREASED Confirmed by Christella Noa [id number]) on 05/31/2023 3:51:43 PM    ______________________________________________________________________  Discharge Instructions:           Other Instructions       Discharge instructions      You were admitted to [facility] for abdominal pain and found to have new ascites (fluid in your abdomen). We tested a sample of the fluid and it did not show signs of infection making it likely due to your liver disease and changes that had occurred to your medications. We used IV lasix to help remove some of this fluid and transitioned you to a higher dose of the oral lasix. Additionally we decreased your spironolactone dose to 50mg . We also restarted your coreg.     We switched your mood medication from Cymbalta to lexapro because it does not need to be adjusted for liver function. We also stopped your aspirin due to low platelets from your liver function.     You will have a follow up appointment scheduled with your primary care provider next week. On Monday you should walk into the lab from 8-12 or 1-4 to have labs drawn.      MEDICATION CHANGES  STOP Cymbalta  STOP aspirin   START Lexapro 10mg  daily  START Coreg 3.125mg   2 times per day  START lasix 80mg  daily  DECREASE Spironolactone to 50mg  daily    Please carefully read and follow these instructions below upon your discharge:    1) Please take your medications as prescribed and note the changes listed on your discharge. At future follow-up appointments, please be sure to take all of your medications with you so your provider can better guide your care.     2) Seek medical care with your primary care doctor or local Emergency Room or Urgent Care if you develop any changes in your mental status, worsening abdominal pain, fevers greater than 101.5, any unexplained/unrelieved shortness of breath, uncontrolled nausea and vomiting that keeps you from remaining hydrated or taking your medication, or any other concerning symptoms.     3) Please go to your follow-up appointments. Some of your follow-up appointments have been listed below. If you do not see an appointment listed below with your primary care doctor, please call your doctor's office as soon as possible to schedule an appointment to be seen within 7-10 days of discharge.     4) If you have any concerns before you are able to follow-up with your primary care doctor, you can reach  [place] by calling [phone] and asking to page the resident on call.                 Follow Up instructions and Outpatient Referrals     Basic metabolic panel      Is this a fasting order?: No    Release to patient: Immediate     BMP contains the following laboratory tests: NA, K, CL, CO2, BUN, CR,   GLUC, and CA.     CBC      Release to patient: Immediate    Discharge instructions      Magnesium Level      Release to patient: Immediate        Appointments which have been scheduled for you      Jun 12, 2023 9:40 AM  (Arrive by 9:25 AM)  RETURN CARE TRANSITIONS [place] with Arneta Cliche, MD  [facility]) [address] [place] [address]  [phone]        Jun 27, 2023 10:20 AM  (Arrive by 10:05 AM)  Physical with Marca Ancona, MD  [facility]) [address] [place] [address]  [phone]   Arrive 15 minutes prior to appointment.         Jul 03, 2023 1:00 PM  (Arrive by 12:30 PM)  RETURN  HEPATOLOGY with Dorene Sorrow, PA  [facility] [place] [facility]) [address]  [place] [address]  [phone]        Sep 03, 2023 10:00 AM  (Arrive by 9:45 AM)  NEW GENERAL PCP with Corinda Gubler, PA  [facility] [place] [facility]) [address]  [facility] 1 through [address]  [place] [address]  [phone]        Dec 17, 2023 3:00 PM  RETURN CARDIOLOGY with Farrel Conners, MD  [facility] CARDIOLOGY AT [facility]) [address] [place] [address]  [phone]        Feb 24, 2024 9:30 AM  (Arrive by 9:05 AM)  NEW NEUROLOGY with Garnette Scheuermann, MD  [facility] [place] [facility] [place] [facility]ION) [address]  [place] [address]  [phone]             ______________________________________________________________________  Discharge Day Services:  BP 110/55  - Pulse 83  - Temp 36.5 ??C (97.7 ??F) (Temporal)  - Resp 16  - Ht 165.1 cm (5' 5)  - Wt 97.1 kg (214 lb 1.6 oz)  - SpO2 98%  - BMI 35.63 kg/m??   Pt seen on the day of discharge and determined appropriate for discharge.    GEN: well appearing, lying in bed, NAD   HEENT: NCAT, MMM. EOMI.  Neck: Supple.  CV: Regular rate and rhythm. No murmurs/rubs/gallops.  Pulm: CTAB. No wheezing, crackles, or rhonchi.  Abd: Soft, distended (improved). No guarding, rebound.  Normoactive bowel sounds.    Neuro: A&O x 3. No focal deficits.   Ext: No peripheral edema.  Palpable distal pulses.    Condition at Discharge: good    Length of Discharge: I spent greater than 30 mins in the discharge of this patient.

## 2023-06-02 NOTE — Unmapped (Signed)
Patient has been alert and oriented this shift. Independent in room. Tolerating meals. Medications given per mar. Tolerated well. Good amount of output after IV lasix given and documented in chart. No BM this shift. Patient verbalizes she has had a lot of gas after doses of lactulose given. No c/o pain. Visitors at bedside this shift. Call bell within reach.     Problem: Adult Inpatient Plan of Care  Goal: Plan of Care Review  Outcome: Progressing  Flowsheets (Taken 06/01/2023 1650)  Progress: improving  Plan of Care Reviewed With: patient  Goal: Patient-Specific Goal (Individualized)  Outcome: Progressing  Goal: Absence of Hospital-Acquired Illness or Injury  Outcome: Progressing  Intervention: Identify and Manage Fall Risk  Recent Flowsheet Documentation  Taken 06/01/2023 0800 by Emiliano Dyer, RN  Safety Interventions:   fall reduction program maintained   infection management   low bed   nonskid shoes/slippers when out of bed  Intervention: Prevent Infection  Recent Flowsheet Documentation  Taken 06/01/2023 0800 by Emiliano Dyer, RN  Infection Prevention:   hand hygiene promoted   personal protective equipment utilized   single patient room provided  Goal: Optimal Comfort and Wellbeing  Outcome: Progressing  Goal: Readiness for Transition of Care  Outcome: Progressing  Goal: Rounds/Family Conference  Outcome: Progressing     Problem: Comorbidity Management  Goal: Blood Glucose Levels Within Targeted Range  Reactivated  Goal: Blood Pressure in Desired Range  Reactivated     Problem: Fall Injury Risk  Goal: Absence of Fall and Fall-Related Injury  Reactivated  Intervention: Promote Injury-Free Environment  Recent Flowsheet Documentation  Taken 06/01/2023 0800 by Emiliano Dyer, RN  Safety Interventions:   fall reduction program maintained   infection management   low bed   nonskid shoes/slippers when out of bed

## 2023-06-02 NOTE — Unmapped (Signed)
Pt abdominal pain controlled appropriately without any acute distress.Stated pain relief during reassessment.Pt has been compliant with strict in and out orders. Fall  precaution maintained.Encouraged to call for help when in need.Will keep monitoring.   Problem: Adult Inpatient Plan of Care  Goal: Plan of Care Review  Outcome: Ongoing - Unchanged  Goal: Patient-Specific Goal (Individualized)  Outcome: Ongoing - Unchanged  Goal: Absence of Hospital-Acquired Illness or Injury  Outcome: Ongoing - Unchanged  Intervention: Identify and Manage Fall Risk  Recent Flowsheet Documentation  Taken 06/01/2023 2025 by Wonda Olds, RN  Safety Interventions:   bed alarm   fall reduction program maintained   lighting adjusted for tasks/safety   low bed  Intervention: Prevent Infection  Recent Flowsheet Documentation  Taken 06/01/2023 2025 by Wonda Olds, RN  Infection Prevention:   cohorting utilized   hand hygiene promoted  Goal: Optimal Comfort and Wellbeing  Outcome: Ongoing - Unchanged  Goal: Readiness for Transition of Care  Outcome: Ongoing - Unchanged  Goal: Rounds/Family Conference  Outcome: Ongoing - Unchanged     Problem: Comorbidity Management  Goal: Blood Glucose Levels Within Targeted Range  Outcome: Ongoing - Unchanged  Goal: Blood Pressure in Desired Range  Outcome: Ongoing - Unchanged     Problem: Fall Injury Risk  Goal: Absence of Fall and Fall-Related Injury  Outcome: Ongoing - Unchanged  Intervention: Promote Injury-Free Environment  Recent Flowsheet Documentation  Taken 06/01/2023 2025 by Wonda Olds, RN  Safety Interventions:   bed alarm   fall reduction program maintained   lighting adjusted for tasks/safety   low bed     Problem: Pain Acute  Goal: Optimal Pain Control and Function  Outcome: Ongoing - Unchanged

## 2023-06-02 NOTE — Unmapped (Signed)
Family Medicine Inpatient Service  Progress Note    Team: Family Medicine [facility] (pgr [id number])    Hospital Day: 2    ASSESSMENT / PLAN:   Denise York is a 57 y.o. 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      #MASH - Hepatic encephalopathy - Portal HTN   Patient with h/o MASH with no history of jaundice, ascites, or esophageal variceal bleeding. New onset large volume abdominopelvic ascites, diagnostic para reassuring against SBP. Adequate response to IV lasix 40mg  yesterday. Restarted spironolactone 100mg  and carvedilol 3.125mg . BP softer today, will decrease spironolactone to 50mg  and continue IV diuresis. Now having adequate BM on below regimen.  - Continue home Rifaximin 550 mg BID and titrate home Lactulose to 20g (oral sln) TID  - Continue home Coreg 3.125 mg BID   - Decrease Spironolactone to 50 mg daily  - PO lasix 80mg   - Hold home lasix 40mg  PO  - Strict I/Os and daily weights  - discontinued cymbalta (Child Pugh B/C)    #Thrombocytopenia  Plts 51 on admission. Likely from above. Will CTM  - hold DVT prophylaxis given plts <50    #Nonobstructive coronary artery disease   - Continue home Atorvastatin 20 mg daily   - Hold home Aspirin 81 mg daily (thrombocytopenia)     #T2DM  A1c here 5.2%  - Hold Metformin 500 mg daily, restart at discharge      # C/f Subsegmental pulmonary emboli (resolved)  - visualized defects in subsegmental pulmonary arteries on CTAP, no embolisms noted on dedicated CT Chest.     #Hypomagnesemia (resolved)  - mg 1.5 on admission.  - s/p Repletion via Mag oxide 800 mg  - Repeat Mag     Chronic medical problems:    #Depression: discontinued Cymbalta 20 mg daily, will discuss alternatives   #Restless leg syndrome: Continue home Ropinirole 0.25 mg BID   #GERD: Protonix 20 mg daily    #Sleep: Continue home Trazodone 50 mg daily      # Checklist:  - IVF None  - Daily labs needed: CBC, Magnesium, and CMP  - Diet Regular  - Bowel Regimen: lactulose  - DVT: SQ Lovenox (held for plts)  - Code Status:   Orders Placed This Encounter   Procedures    Full Code     Standing Status:   Standing     Number of Occurrences:   1     - Dispo: Floor    [ ]  Anticipated Discharge Location: Home  [ ]  PT/OT/DME: PT/OT ordered  [ ]  CM/SW needs: None anticipated  [ ]  Follow up appt: Appointment needed    SUBJECTIVE:  Interval events: tight abdomen. Has not had BM      REVIEW OF SYSTEMS:  Pertinent positives and negatives per HPI. A complete review of systems otherwise negative.    PHYSICAL EXAM:      Intake/Output Summary (Last 24 hours) at 06/02/2023 0721  Last data filed at 06/01/2023 2025  Gross per 24 hour   Intake --   Output 1400 ml   Net -1400 ml       Recent Vitals:  Vitals:    06/01/23 1919   BP: 106/54   Pulse: 79   Resp: 16   Temp: 36.4 ??C (97.5 ??  F)   SpO2: 100%       GEN: Well-appearing, lying in bed, in mild distress from pain   Eyes: PERRL. No scleral icterus. Conjunctiva non-erythematous. EOMI.  HEENT: NCAT, MMM. Oropharynx clear.  Neck: Supple.  Lymphadenopathy: No cervical or supraclavicular LAD.  CV: Regular rate and rhythm. No murmurs/rubs/gallops. No costochondral tenderness. No cyanosis or clubbing. Cap Refill < 2 secs  Pulm: CTAB. No wheezing, crackles, or rhonchi.  Abd: Distended. tender to palpation. Fluid wave present.   Neuro: A&O x 3. No focal deficits. Moving all extremities spontaneously.   Ext: No peripheral edema.  Palpable distal pulses.  Skin: No rashes or skin lesions.        LABS/ STUDIES:    All imaging, laboratory studies, and other pertinent tests including electrocardiography within the last 24 hours were reviewed and are summarized within the assessment and plan.     NUTRITION:                             Zollie Beckers, MD,  PGY2  June 01, 2023 6:00 AM

## 2023-06-03 LAB — CBC W/ AUTO DIFF
BASOPHILS ABSOLUTE COUNT: 0 10*9/L (ref 0.0–0.1)
BASOPHILS RELATIVE PERCENT: 1 %
EOSINOPHILS ABSOLUTE COUNT: 0.1 10*9/L (ref 0.0–0.5)
EOSINOPHILS RELATIVE PERCENT: 3.3 %
HEMATOCRIT: 28.9 % — ABNORMAL LOW (ref 34.0–44.0)
HEMOGLOBIN: 9.9 g/dL — ABNORMAL LOW (ref 11.3–14.9)
LYMPHOCYTES ABSOLUTE COUNT: 1 10*9/L — ABNORMAL LOW (ref 1.1–3.6)
LYMPHOCYTES RELATIVE PERCENT: 26.1 %
MEAN CORPUSCULAR HEMOGLOBIN CONC: 34.4 g/dL (ref 32.0–36.0)
MEAN CORPUSCULAR HEMOGLOBIN: 30.1 pg (ref 25.9–32.4)
MEAN CORPUSCULAR VOLUME: 87.7 fL (ref 77.6–95.7)
MEAN PLATELET VOLUME: 11.1 fL — ABNORMAL HIGH (ref 6.8–10.7)
MONOCYTES ABSOLUTE COUNT: 0.5 10*9/L (ref 0.3–0.8)
MONOCYTES RELATIVE PERCENT: 13.1 %
NEUTROPHILS ABSOLUTE COUNT: 2.1 10*9/L (ref 1.8–7.8)
NEUTROPHILS RELATIVE PERCENT: 56.5 %
NUCLEATED RED BLOOD CELLS: 0 /100{WBCs} (ref <=4)
PLATELET COUNT: 39 10*9/L — ABNORMAL LOW (ref 150–450)
RED BLOOD CELL COUNT: 3.29 10*12/L — ABNORMAL LOW (ref 3.95–5.13)
RED CELL DISTRIBUTION WIDTH: 15.4 % — ABNORMAL HIGH (ref 12.2–15.2)
WBC ADJUSTED: 3.7 10*9/L (ref 3.6–11.2)

## 2023-06-03 LAB — COMPREHENSIVE METABOLIC PANEL
ALBUMIN: 2.3 g/dL — ABNORMAL LOW (ref 3.4–5.0)
ALKALINE PHOSPHATASE: 79 U/L (ref 46–116)
ALT (SGPT): 21 U/L (ref 10–49)
ANION GAP: 8 mmol/L (ref 5–14)
AST (SGOT): 44 U/L — ABNORMAL HIGH (ref <=34)
BILIRUBIN TOTAL: 1.8 mg/dL — ABNORMAL HIGH (ref 0.3–1.2)
BLOOD UREA NITROGEN: 10 mg/dL (ref 9–23)
BUN / CREAT RATIO: 10
CALCIUM: 8.9 mg/dL (ref 8.7–10.4)
CHLORIDE: 102 mmol/L (ref 98–107)
CO2: 34.7 mmol/L — ABNORMAL HIGH (ref 20.0–31.0)
CREATININE: 1.05 mg/dL — ABNORMAL HIGH (ref 0.55–1.02)
EGFR CKD-EPI (2021) FEMALE: 62 mL/min/{1.73_m2} (ref >=60)
GLUCOSE RANDOM: 105 mg/dL (ref 70–179)
POTASSIUM: 4 mmol/L (ref 3.4–4.8)
PROTEIN TOTAL: 5.5 g/dL — ABNORMAL LOW (ref 5.7–8.2)
SODIUM: 145 mmol/L (ref 135–145)

## 2023-06-03 LAB — MAGNESIUM: MAGNESIUM: 1.6 mg/dL (ref 1.6–2.6)

## 2023-06-03 MED ORDER — LACTULOSE 10 GRAM/15 ML ORAL SOLUTION
Freq: Three times a day (TID) | ORAL | 0 refills | 30 days | Status: CP
Start: 2023-06-03 — End: 2023-07-03
  Filled 2023-06-03: 30d supply, fill #0

## 2023-06-03 MED ORDER — SPIRONOLACTONE 50 MG TABLET
ORAL_TABLET | Freq: Every day | ORAL | 0 refills | 30 days | Status: CP
Start: 2023-06-03 — End: 2023-07-03
  Filled 2023-06-03: qty 30, 30d supply, fill #0

## 2023-06-03 MED ORDER — ESCITALOPRAM 10 MG TABLET
ORAL_TABLET | Freq: Every day | ORAL | 0 refills | 30 days | Status: CP
Start: 2023-06-03 — End: 2023-07-03
  Filled 2023-06-03: qty 30, 30d supply, fill #0

## 2023-06-03 MED ORDER — FUROSEMIDE 80 MG TABLET
ORAL_TABLET | Freq: Every day | ORAL | 0 refills | 30 days | Status: CP
Start: 2023-06-03 — End: 2023-07-03
  Filled 2023-06-03: qty 30, 30d supply, fill #0

## 2023-06-03 MED ORDER — CARVEDILOL 3.125 MG TABLET
ORAL_TABLET | Freq: Two times a day (BID) | ORAL | 0 refills | 30 days | Status: CP
Start: 2023-06-03 — End: 2023-07-03
  Filled 2023-06-03: qty 60, 30d supply, fill #0

## 2023-06-03 MED ADMIN — pantoprazole (Protonix) EC tablet 20 mg: 20 mg | ORAL | @ 15:00:00 | Stop: 2023-06-03

## 2023-06-03 MED ADMIN — rOPINIRole (REQUIP) tablet 0.25 mg: .25 mg | ORAL | @ 01:00:00

## 2023-06-03 MED ADMIN — traZODone (DESYREL) tablet 50 mg: 50 mg | ORAL | @ 01:00:00

## 2023-06-03 MED ADMIN — carvedilol (COREG) tablet 3.125 mg: 3.125 mg | ORAL | @ 01:00:00

## 2023-06-03 MED ADMIN — carvedilol (COREG) tablet 3.125 mg: 3.125 mg | ORAL | @ 14:00:00 | Stop: 2023-06-03

## 2023-06-03 MED ADMIN — rifAXIMin (XIFAXAN) tablet 550 mg: 550 mg | ORAL | @ 14:00:00 | Stop: 2023-06-03

## 2023-06-03 MED ADMIN — rifAXIMin (XIFAXAN) tablet 550 mg: 550 mg | ORAL | @ 01:00:00 | Stop: 2023-06-07

## 2023-06-03 MED ADMIN — furosemide (LASIX) tablet 80 mg: 80 mg | ORAL | @ 15:00:00 | Stop: 2023-06-03

## 2023-06-03 MED ADMIN — spironolactone (ALDACTONE) tablet 50 mg: 50 mg | ORAL | @ 15:00:00 | Stop: 2023-06-03

## 2023-06-03 MED ADMIN — escitalopram oxalate (LEXAPRO) tablet 10 mg: 10 mg | ORAL | @ 15:00:00 | Stop: 2023-06-03

## 2023-06-03 MED ADMIN — atorvastatin (LIPITOR) tablet 20 mg: 20 mg | ORAL | @ 15:00:00 | Stop: 2023-06-03

## 2023-06-03 MED ADMIN — magnesium oxide (MAG-OX) tablet 800 mg: 800 mg | ORAL | @ 14:00:00 | Stop: 2023-06-03

## 2023-06-03 MED ADMIN — rOPINIRole (REQUIP) tablet 0.25 mg: .25 mg | ORAL | @ 15:00:00 | Stop: 2023-06-03

## 2023-06-03 NOTE — Unmapped (Signed)
Pt assisted with needs.Denies pain and no acute distress noted at this time.Fall  precaution maintained.Encouraged to call for help when in need.Will keep monitoring.   Problem: Adult Inpatient Plan of Care  Goal: Plan of Care Review  Outcome: Ongoing - Unchanged  Goal: Patient-Specific Goal (Individualized)  Outcome: Ongoing - Unchanged  Goal: Absence of Hospital-Acquired Illness or Injury  Outcome: Ongoing - Unchanged  Intervention: Identify and Manage Fall Risk  Recent Flowsheet Documentation  Taken 06/02/2023 2000 by Wonda Olds, RN  Safety Interventions:   bed alarm   fall reduction program maintained   lighting adjusted for tasks/safety   low bed   family at bedside  Intervention: Prevent Infection  Recent Flowsheet Documentation  Taken 06/02/2023 2000 by Wonda Olds, RN  Infection Prevention:   cohorting utilized   hand hygiene promoted  Goal: Optimal Comfort and Wellbeing  Outcome: Ongoing - Unchanged  Goal: Readiness for Transition of Care  Outcome: Ongoing - Unchanged  Goal: Rounds/Family Conference  Outcome: Ongoing - Unchanged     Problem: Fall Injury Risk  Goal: Absence of Fall and Fall-Related Injury  Outcome: Ongoing - Unchanged  Intervention: Promote Injury-Free Environment  Recent Flowsheet Documentation  Taken 06/02/2023 2000 by Wonda Olds, RN  Safety Interventions:   bed alarm   fall reduction program maintained   lighting adjusted for tasks/safety   low bed   family at bedside     Problem: Pain Acute  Goal: Optimal Pain Control and Function  Outcome: Ongoing - Unchanged

## 2023-06-03 NOTE — Unmapped (Signed)
Discharge teaching and instructions given. Verbalized understanding.     Problem: Adult Inpatient Plan of Care  Goal: Plan of Care Review  06/03/2023 1130 by Emiliano Dyer, RN  Outcome: Discharged to Home  06/03/2023 1125 by Emiliano Dyer, RN  Outcome: Progressing  Flowsheets (Taken 06/03/2023 1125)  Progress: improving  Plan of Care Reviewed With: patient  Goal: Patient-Specific Goal (Individualized)  06/03/2023 1130 by Emiliano Dyer, RN  Outcome: Discharged to Home  06/03/2023 1125 by Emiliano Dyer, RN  Outcome: Progressing  Goal: Absence of Hospital-Acquired Illness or Injury  06/03/2023 1130 by Emiliano Dyer, RN  Outcome: Discharged to Home  06/03/2023 1125 by Emiliano Dyer, RN  Outcome: Progressing  Intervention: Identify and Manage Fall Risk  Recent Flowsheet Documentation  Taken 06/03/2023 0800 by Emiliano Dyer, RN  Safety Interventions:   fall reduction program maintained   infection management   low bed  Intervention: Prevent Infection  Recent Flowsheet Documentation  Taken 06/03/2023 0800 by Emiliano Dyer, RN  Infection Prevention:   hand hygiene promoted   personal protective equipment utilized   single patient room provided  Goal: Optimal Comfort and Wellbeing  06/03/2023 1130 by Emiliano Dyer, RN  Outcome: Discharged to Home  06/03/2023 1125 by Emiliano Dyer, RN  Outcome: Progressing  Goal: Readiness for Transition of Care  06/03/2023 1130 by Emiliano Dyer, RN  Outcome: Discharged to Home  06/03/2023 1125 by Emiliano Dyer, RN  Outcome: Progressing  Goal: Rounds/Family Conference  06/03/2023 1130 by Emiliano Dyer, RN  Outcome: Discharged to Home  06/03/2023 1125 by Emiliano Dyer, RN  Outcome: Progressing     Problem: Comorbidity Management  Goal: Blood Glucose Levels Within Targeted Range  06/03/2023 1130 by Emiliano Dyer, RN  Outcome: Discharged to Home  06/03/2023 1125 by Emiliano Dyer, RN  Outcome: Progressing  Goal: Blood Pressure in Desired Range  06/03/2023 1130 by Emiliano Dyer, RN  Outcome: Discharged to Home  06/03/2023 1125 by Emiliano Dyer, RN  Outcome: Progressing     Problem: Fall Injury Risk  Goal: Absence of Fall and Fall-Related Injury  06/03/2023 1130 by Emiliano Dyer, RN  Outcome: Discharged to Home  06/03/2023 1125 by Emiliano Dyer, RN  Outcome: Progressing  Intervention: Promote Injury-Free Environment  Recent Flowsheet Documentation  Taken 06/03/2023 0800 by Emiliano Dyer, RN  Safety Interventions:   fall reduction program maintained   infection management   low bed     Problem: Pain Acute  Goal: Optimal Pain Control and Function  06/03/2023 1130 by Emiliano Dyer, RN  Outcome: Discharged to Home  06/03/2023 1125 by Emiliano Dyer, RN  Outcome: Progressing

## 2023-06-03 NOTE — Unmapped (Signed)
Patient alert and oriented. Vss. No c/o pain. Independent in room. Medications given per mar. Tolerating meals. No needs at this time. Call bell within reach.     Problem: Adult Inpatient Plan of Care  Goal: Plan of Care Review  Outcome: Ongoing - Unchanged  Flowsheets (Taken 06/02/2023 1804)  Progress: improving  Plan of Care Reviewed With: patient  Goal: Patient-Specific Goal (Individualized)  Outcome: Ongoing - Unchanged  Goal: Absence of Hospital-Acquired Illness or Injury  Outcome: Ongoing - Unchanged  Intervention: Identify and Manage Fall Risk  Recent Flowsheet Documentation  Taken 06/02/2023 0800 by Emiliano Dyer, RN  Safety Interventions:   fall reduction program maintained   infection management   low bed  Intervention: Prevent Infection  Recent Flowsheet Documentation  Taken 06/02/2023 0800 by Emiliano Dyer, RN  Infection Prevention:   hand hygiene promoted   personal protective equipment utilized   single patient room provided  Goal: Optimal Comfort and Wellbeing  Outcome: Ongoing - Unchanged  Goal: Readiness for Transition of Care  Outcome: Ongoing - Unchanged  Goal: Rounds/Family Conference  Outcome: Ongoing - Unchanged     Problem: Fall Injury Risk  Goal: Absence of Fall and Fall-Related Injury  Outcome: Ongoing - Unchanged  Intervention: Promote Injury-Free Environment  Recent Flowsheet Documentation  Taken 06/02/2023 0800 by Emiliano Dyer, RN  Safety Interventions:   fall reduction program maintained   infection management   low bed     Problem: Comorbidity Management  Goal: Blood Glucose Levels Within Targeted Range  Outcome: Ongoing - Unchanged  Goal: Blood Pressure in Desired Range  Outcome: Ongoing - Unchanged     Problem: Pain Acute  Goal: Optimal Pain Control and Function  Outcome: Ongoing - Unchanged

## 2023-06-03 NOTE — Unmapped (Signed)
Patient alert and oriented this shift. No c/o pain. Medications given per mar. Tolerating meals. Independent in room. Call bell within reach.     Problem: Adult Inpatient Plan of Care  Goal: Plan of Care Review  Outcome: Progressing  Flowsheets (Taken 06/03/2023 1125)  Progress: improving  Plan of Care Reviewed With: patient  Goal: Patient-Specific Goal (Individualized)  Outcome: Progressing  Goal: Absence of Hospital-Acquired Illness or Injury  Outcome: Progressing  Intervention: Identify and Manage Fall Risk  Recent Flowsheet Documentation  Taken 06/03/2023 0800 by Emiliano Dyer, RN  Safety Interventions:   fall reduction program maintained   infection management   low bed  Intervention: Prevent Infection  Recent Flowsheet Documentation  Taken 06/03/2023 0800 by Emiliano Dyer, RN  Infection Prevention:   hand hygiene promoted   personal protective equipment utilized   single patient room provided  Goal: Optimal Comfort and Wellbeing  Outcome: Progressing  Goal: Readiness for Transition of Care  Outcome: Progressing  Goal: Rounds/Family Conference  Outcome: Progressing     Problem: Comorbidity Management  Goal: Blood Glucose Levels Within Targeted Range  Outcome: Progressing  Goal: Blood Pressure in Desired Range  Outcome: Progressing     Problem: Fall Injury Risk  Goal: Absence of Fall and Fall-Related Injury  Outcome: Progressing  Intervention: Promote Injury-Free Environment  Recent Flowsheet Documentation  Taken 06/03/2023 0800 by Emiliano Dyer, RN  Safety Interventions:   fall reduction program maintained   infection management   low bed     Problem: Pain Acute  Goal: Optimal Pain Control and Function  Outcome: Progressing

## 2023-06-12 ENCOUNTER — Ambulatory Visit: Admit: 2023-06-12 | Discharge: 2023-06-13 | Payer: PRIVATE HEALTH INSURANCE

## 2023-06-12 DIAGNOSIS — R188 Other ascites: Principal | ICD-10-CM

## 2023-06-12 DIAGNOSIS — E119 Type 2 diabetes mellitus without complications: Principal | ICD-10-CM

## 2023-06-12 DIAGNOSIS — D696 Thrombocytopenia, unspecified: Principal | ICD-10-CM

## 2023-06-12 DIAGNOSIS — R17 Unspecified jaundice: Principal | ICD-10-CM

## 2023-06-12 DIAGNOSIS — K7682 Hepatic encephalopathy (CMS-HCC): Principal | ICD-10-CM

## 2023-06-12 DIAGNOSIS — K7581 Nonalcoholic steatohepatitis (NASH): Principal | ICD-10-CM

## 2023-06-12 DIAGNOSIS — K7460 Unspecified cirrhosis of liver: Principal | ICD-10-CM

## 2023-06-12 LAB — CBC
HEMATOCRIT: 31 % — ABNORMAL LOW (ref 34.0–44.0)
HEMOGLOBIN: 10.5 g/dL — ABNORMAL LOW (ref 11.3–14.9)
MEAN CORPUSCULAR HEMOGLOBIN CONC: 34 g/dL (ref 32.0–36.0)
MEAN CORPUSCULAR HEMOGLOBIN: 29.6 pg (ref 25.9–32.4)
MEAN CORPUSCULAR VOLUME: 87 fL (ref 77.6–95.7)
MEAN PLATELET VOLUME: 11.5 fL — ABNORMAL HIGH (ref 6.8–10.7)
PLATELET COUNT: 55 10*9/L — ABNORMAL LOW (ref 150–450)
RED BLOOD CELL COUNT: 3.56 10*12/L — ABNORMAL LOW (ref 3.95–5.13)
RED CELL DISTRIBUTION WIDTH: 15.1 % (ref 12.2–15.2)
WBC ADJUSTED: 4.6 10*9/L (ref 3.6–11.2)

## 2023-06-12 LAB — BASIC METABOLIC PANEL
ANION GAP: 10 mmol/L (ref 5–14)
BLOOD UREA NITROGEN: 16 mg/dL (ref 6–24)
BLOOD UREA NITROGEN: 15 mg/dL (ref 9–23)
BUN / CREAT RATIO: 16 (ref 9–23)
BUN / CREAT RATIO: 16
CALCIUM: 9.6 mg/dL (ref 8.7–10.2)
CALCIUM: 9.5 mg/dL (ref 8.7–10.4)
CHLORIDE: 99 mmol/L (ref 96–106)
CHLORIDE: 100 mmol/L (ref 98–107)
CO2: 24 mmol/L (ref 20–29)
CO2: 30.3 mmol/L (ref 20.0–31.0)
CREATININE: 1.03 mg/dL — ABNORMAL HIGH (ref 0.57–1.00)
CREATININE: 0.91 mg/dL (ref 0.55–1.02)
EGFR CKD-EPI (2021) FEMALE: 74 mL/min/{1.73_m2} (ref >=60)
GLUCOSE RANDOM: 130 mg/dL (ref 70–179)
GLUCOSE: 147 mg/dL — ABNORMAL HIGH (ref 70–99)
POTASSIUM: 4.1 mmol/L (ref 3.5–5.2)
POTASSIUM: 4.1 mmol/L (ref 3.4–4.8)
SODIUM: 137 mmol/L (ref 134–144)
SODIUM: 140 mmol/L (ref 135–145)

## 2023-06-12 LAB — HEPATIC FUNCTION PANEL
ALBUMIN: 2.7 g/dL — ABNORMAL LOW (ref 3.4–5.0)
ALKALINE PHOSPHATASE: 125 U/L — ABNORMAL HIGH (ref 46–116)
ALT (SGPT): 31 U/L (ref 10–49)
AST (SGOT): 55 U/L — ABNORMAL HIGH (ref <=34)
BILIRUBIN DIRECT: 0.6 mg/dL — ABNORMAL HIGH (ref 0.00–0.30)
BILIRUBIN TOTAL: 1.5 mg/dL — ABNORMAL HIGH (ref 0.3–1.2)
PROTEIN TOTAL: 6.6 g/dL (ref 5.7–8.2)

## 2023-06-12 LAB — MAGNESIUM: MAGNESIUM: 1.9 mg/dL (ref 1.6–2.6)

## 2023-06-12 MED ORDER — METFORMIN ER 500 MG TABLET,EXTENDED RELEASE 24 HR
ORAL_TABLET | Freq: Every day | ORAL | 2 refills | 90 days
Start: 2023-06-12 — End: ?

## 2023-06-12 MED ORDER — ROPINIROLE 0.25 MG TABLET
ORAL_TABLET | Freq: Two times a day (BID) | ORAL | 1 refills | 90 days
Start: 2023-06-12 — End: 2024-06-11

## 2023-06-12 MED ORDER — LORATADINE 10 MG TABLET
ORAL_TABLET | Freq: Every day | ORAL | 1 refills | 90 days
Start: 2023-06-12 — End: ?

## 2023-06-12 MED ORDER — TRAZODONE 50 MG TABLET
ORAL_TABLET | Freq: Every evening | ORAL | 3 refills | 90 days
Start: 2023-06-12 — End: ?

## 2023-06-12 NOTE — Unmapped (Signed)
Chief Complaint:  Chief Complaint   Patient presents with    Hospitalization Follow-up     Patient states something is making her itchy    MASH    hepatic encephalopathy    pruritis generalized       This patients last WCC/CPE date: : Not Found  This patient's last AWV date: [facility] Last Medicare Wellness Visit Date: Not Found    History of Present Illness:  Denise York is a 57 y.o. female who is here for a hospital follow up. Patient  was admitted to the hospital on 05/31/23 and discharged on 06/03/23. Primary diagnosis was Fatty Liver disease, nonalcoholic.Denise York Since patient was discharged home, patient has been feeling better than when she was in the hospital. Still feels very tired and having itching all over. Itching is the worse problem for her. Finds that she is scratching her skin and leaving marks. Has continued to lose weight on furosemide. No more problems with constipation since ascites resolved and on higher dose of lactulose and rifamixin. Denise York Patient has been taking medications as prescribed by the hospital team. The hospital team recommended that I as the PCP follow up on   [ ]  change from Cymbalta to Lexapro - Taking lexapro without problems  [ ]  volume status on lasix 80mg  daily - weight down 6 pounds from when she was in the hospital.  [ ]  Monitor K on new lasix and spironolactone - getting labwork today  [ ]  BP monitoring, could increase spiro if tolerating - blood pressure borderline today  [ ]  Bowel movements on lactulose and Rifaximin - three loose stools a day  [ ]  set up mail order for prescriptions - working on setting her up with pill packs from [facility]  [ ]  CBC for thrombocytopenia - lab done today.     Patient was called by my staff on 06/04/23 and 06/09/23 - they were unable to reach patient - see telephone notes for full details on conversation.     Medications prescribed or ordered upon discharge were reviewed today and reconciled with the most recent outpatient medication list.  Medication reconciliation was conducted by a prescribing practitioner, or clinical pharmacist.    WHAT FOLLOWS IS A DIRECT QUOTE FROM THE DISCHARGE PAPERWORK DESCRIBING PATIENT'S HOSPITAL COURSE. THIS QUOTE IS IN QUOTATIONS AND IN ITALICS FOR EASIER VISIBILITY. THIS HISTORY WAS REVIEWED AT LENGTH TODAY AND PATIENT CONFIRMED THIS WAS CORRECT WHERE ABLE TO DO SO:      Hospital Course:   Denise York is a 57 y.o. 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      #MASH - Hepatic encephalopathy - Portal HTN   Patient with h/o MASH with no history of jaundice, ascites, or esophageal variceal bleeding. CT AP shows cirrhotic liver morphology with sequelae of portal hypertension including mild splenomegaly, small caliber upper abdominal varices, and large volume abdominopelvic ascites. Labs remarkable for AST 47, PT 15.1, Mag 1.5, bili 2.3 and albumin 2.7. Diagnostic para completed in ED with 96 nucleated cells; no concern for SBP. Deferred LVP given she has not been on an effective diuresis regimen. Restarted spironolactone at 50mg  daily and lasix 80mg  with adequate response and improvement in abdominal pain. Titrated rifaxamin and lactulose to have 3-5BM per day. Will need continued titration  of spironolactone as blood pressure will tolerate.     #Thrombocytopenia  Plts ranged 39-51 during admission. Stopped her home ASA and did not give medication based DVT prophylaxis.      #Hypomagnesemia (resolved)  Initially Mg 1.5, improved with repletion.     #Nonobstructive coronary artery disease   Continued home Atorvastatin 20 mg and held Aspirin 81 mg daily.     #Diabetes  Glucose 106 on admission, well controlled on Metformin 500 mg daily, continued while admitted. Updated A1c 5.2%. # C/f Subsegmental pulmonary emboli (resolved)  Visualized defects in subsegmental pulmonary arteries on CTAP, no embolisms noted on dedicated CT Chest.      #Depression   Stopped Cymblata started lexparo 10mg  daily, will likely need increase     THIS CONCLUDES THE DIRECT QUOTE FROM THE DISCHARGE PAPERWORK.      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    Obesity     Prediabetes      Patient Active Problem List   Diagnosis    Insomnia    Vitamin deficiency    Dysthymic disorder    Restless leg syndrome    Urinary frequency    Incomplete bladder emptying    Nonspecific abnormal results of liver function study    Edema    Vitamin D deficiency    Itch    Fatty liver disease, nonalcoholic    Constipation    Depression    Neoplasm of right breast, 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    Type 2 diabetes mellitus without complication (CMS-HCC)    Bowel incontinence    Mixed incontinence urge and stress    Encounter for long-term current use of medication    Obesity    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    Back pain    Neoplasm of uncertain behavior    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)    Portal hypertension (CMS-HCC)    Hepatic encephalopathy (CMS-HCC)    Thrombocytopenia (CMS-HCC)    Melena    Lower extremity edema    ASD (atrial septal defect)    Nausea and vomiting    Nonobstructive atherosclerosis of coronary artery    Ascites of liver     OB History       Gravida   2    Para   2    Term   2 Preterm        AB        Living   0         SAB        IAB        Ectopic        Molar        Multiple        Live Births   2              Past Surgical History:   Procedure Laterality Date  ABDOMINAL WALL MESH  REMOVAL  1997, 2005    Placement, 1997, removal and replacement 2005    BLADDER SURGERY      bladder tact X2    BREAST BIOPSY Right 04/2013    benign    BREAST EXCISIONAL BIOPSY Right 05/2013    benign    BREAST SURGERY Right     precancerous spot removed    CHOLECYSTECTOMY      HYSTERECTOMY      IR EMBOLIZATION VENOUS OTHER THAN HEMORRHAGE  02/17/2023    IR EMBOLIZATION VENOUS OTHER THAN HEMORRHAGE 02/17/2023 Ammie Dalton, MD IMG VIR [facility]    PR COLSC FLX W/RMVL OF TUMOR POLYP LESION SNARE TQ N/A 10/14/2016    Procedure: COLONOSCOPY FLEX; W/REMOV TUMOR/LES BY SNARE;  Surgeon: Alfred Levins, MD;  Location: [facility] GI PROCEDURES [place];  Service: Gastroenterology    PR COLSC FLX W/RMVL OF TUMOR POLYP LESION SNARE TQ N/A 01/15/2023    Procedure: COLONOSCOPY FLEX; W/REMOV TUMOR/LES BY SNARE;  Surgeon: Mickie Hillier, MD;  Location: [facility];  Service: Gastroenterology    PR PERC CLOS,CONG INTERATRIAL COMMUN W/IMPL N/A 09/12/2022    Procedure: ASD closure;  Surgeon: Jacquelyne Balint, MD;  Location: [facility] CATH;  Service: Cardiology    PR UPPER GI ENDOSCOPY,BIOPSY N/A 01/15/2023    Procedure: UGI ENDOSCOPY; WITH BIOPSY, SINGLE OR MULTIPLE;  Surgeon: Mickie Hillier, MD;  [facility]: [facility];  Service: Gastroenterology    SKIN BIOPSY         Allergies:  Penicillins, Shellfish containing products, Glipizide, and Lisinopril    Current Outpatient Medications   Medication Sig Dispense Refill    atorvastatin (LIPITOR) 20 MG tablet Take 1 tablet (20 mg total) by mouth daily. 90 tablet 3    blood sugar diagnostic (GLUCOSE BLOOD) Strp Use to test blood sugar daily 100 strip 3    blood-glucose meter kit Use to test blood sugar DAILY 1 each 1    carvedilol (COREG) 3.125 MG tablet Take 1 tablet (3.125 mg total) by mouth two (2) times a day. 180 tablet 3    cholecalciferol, vitamin D3, 2,000 unit cap Take 2 capsules (4,000 Units total) by mouth daily. 1 each 0    escitalopram oxalate (LEXAPRO) 10 MG tablet Take 1 tablet (10 mg total) by mouth daily. 90 tablet 3    furosemide (LASIX) 80 MG tablet Take 1 tablet (80 mg total) by mouth daily. 30 tablet 0    inhalational spacing device (AEROCHAMBER MV) Spcr Use as directed with inhalers 1 each 0    lactulose 10 gram/15 mL solution Take 30 mL (20 grams total) by mouth Three (3) times a day. 2700 mL 0    lancets Misc Use to test blood sugar daily 100 each 3    loratadine (CLARITIN) 10 mg tablet Take 1 tablet (10 mg total) by mouth daily. 90 tablet 1    magnesium oxide (MAG-OX) 400 mg (241.3 mg elemental magnesium) tablet Take 1 tablet (400 mg total) by mouth daily. 90 tablet 3    metFORMIN (GLUCOPHAGE-XR) 500 MG 24 hr tablet Take 1 tablet (500 mg total) by mouth daily with evening meal. 90 tablet 3    multivitamin (TAB-A-VITE/THERAGRAN) per tablet Take 1 tablet by mouth daily. 90 tablet 3    omeprazole (PRILOSEC) 20 MG capsule Take 1 capsule (20 mg total) by mouth Two (2) times a day (30 minutes before a meal). 180 capsule 3    psyllium seed, with sugar, (FIBER  ORAL) Take 4 capsules by mouth daily. [employer]Js brand.      rifAXIMin (XIFAXAN) 550 mg Tab Take 1 tablet (550 mg total) by mouth two (2) times a day. 180 tablet 3    rOPINIRole (REQUIP) 0.25 MG tablet Take 1 tablet (0.25 mg total) by mouth two (2) times a day. 180 tablet 3    semaglutide (OZEMPIC) 2 mg/dose (8 mg/3 mL) PnIj Inject 2 mg under the skin every seven (7) days. 9 mL 3    spironolactone (ALDACTONE) 50 MG tablet Take 1 tablet (50 mg total) by mouth daily. 90 tablet 3    traZODone (DESYREL) 50 MG tablet Take 1 tablet (50 mg total) by mouth nightly. 90 tablet 3     No current facility-administered medications for this visit.     Social History     Socioeconomic History    Marital status: Married   Tobacco Use    Smoking status: Never     Passive exposure: Past    Smokeless tobacco: Never   Vaping Use    Vaping status: Never Used   Substance and Sexual Activity    Alcohol use: No    Drug use: No    Sexual activity: Not Currently   Other Topics Concern    Do you use sunscreen? Yes    Excessive sun exposure? Yes   Social History Narrative    2 births. No living children. Married new husband 2014.         02/21/2017        [employer] Components:        Family, social, cultural characteristics: social support includes best friend .      Patient has the following communication needs: none, per patient     Health Literacy: How confident are you that you understand your health issues/concerns, can participate in your care, and manage your care along with your physician: confident.    Behaviors Affecting Health: none, per patient    Family history of mental health illness and/or substance abuse: asked patient/parent and none disclosed.    Have you been seen by any medical provider that we have not referred you to since your last visit ? No    Discussed a Living Will with the patient and [id number] is under the age of 41.      Social Drivers of Health     Food Insecurity: No Food Insecurity (06/12/2023)    Hunger Vital Sign     Worried About Running Out of Food in the Last Year: Never true     Ran Out of Food in the Last Year: Never true   Transportation Needs: No Transportation Needs (06/12/2023)    PRAPARE - Therapist, art (Medical): No     Lack of Transportation (Non-Medical): No     Family History   Problem Relation Age of Onset    Heart disease Mother     Hypertension Mother     Heart disease Father     Kidney disease Father     Allergies Father     Gout Father     Allergies Other         FAMILY H/O    Coronary artery disease Other         FAMILY H/O    Kidney failure Other         FAMILY H/O    No Known Problems Sister     No Known Problems Daughter  No Known Problems Maternal Grandmother     No Known Problems Maternal Grandfather     No Known Problems Paternal Grandmother     No Known Problems Paternal Grandfather     BRCA 1/2 Neg Hx     Breast cancer Neg Hx     Cancer Neg Hx     Colon cancer Neg Hx     Endometrial cancer Neg Hx     Ovarian cancer Neg Hx     Substance Abuse Disorder Neg Hx     Mental illness Neg Hx      Immunization History   Administered Date(s) Administered    COVID-19 VAC,MRNA,TRIS(12Y UP)(PFIZER)(GRAY CAP) 08/19/2020    COVID-19 VACC,MRNA,(PFIZER)(PF) 10/02/2019, 10/23/2019    Covid-19 Vac, (3yr+) (Comirnaty) [employer]  04/08/2022    HEPATITIS B VACCINE ADULT, ADJUVANTED, IM(HEPLISAV B) 06/17/2022, 09/26/2022    Hepatitis A (Adult) 06/17/2022, 09/26/2022    INFLUENZA INJ MDCK PF, QUAD,(FLUCELVAX)(86MO AND UP EGG FREE) 05/15/2021    INFLUENZA TIV (TRI) PF (IM)(HISTORICAL) 04/09/2011    INFLUENZA VACCINE IIV3(IM)(PF)6 MOS UP 06/12/2023    Influenza TRI (IIV3) 5+yrs MDV 06/04/2013    Influenza Vaccine Quad(IM)6 MO-Adult(PF) 05/08/2017, 04/09/2018, 03/11/2019    Influenza Virus Vaccine, unspecified formulation 04/08/2015, 04/07/2016, 05/04/2020, 04/08/2022    PNEUMOCOCCAL POLYSACCHARIDE 23-VALENT 07/19/2014    Pneumococcal Conjugate 20-valent 06/17/2022    SHINGRIX-ZOSTER VACCINE (HZV),RECOMBINANT,ADJUVANTED(IM) 11/09/2020, 05/15/2021    TdaP 07/16/2011, 06/17/2022       Health Maintenance   Topic Date Due    COVID-19 Vaccine (5 - 2024-25 season) 03/09/2023    Foot Exam  05/09/2023    Urine Albumin/Creatinine Ratio  05/09/2023    Retinal Eye Exam  10/02/2023    Mammogram  10/28/2023    Hemoglobin A1c  11/29/2023    Serum Creatinine Monitoring  06/11/2024    Potassium Monitoring  06/11/2024    Colon Cancer Screening  01/15/2028    DTaP/Tdap/Td Vaccines (3 - Td or Tdap) 06/17/2032    Pneumococcal Vaccine 0-64  Completed    Hepatitis C Screen  Completed    Influenza Vaccine  Completed    Zoster Vaccines  Completed       I have reviewed and (if needed) updated the patient's problem list, medications, allergies, past medical and surgical history, social and family history     Review of Systems:  As per HPI     Physical Exam:  Vital Signs:  Vitals:    06/12/23 1505   BP: 112/60   Pulse: 58   Resp: 18   Temp: 36.5 ??C (97.7 ??F)   SpO2: 97%     Height: 165.1 cm (5' 5)    Body mass index is 34.61 kg/m??.  Wt Readings from Last 3 Encounters:   06/12/23 94.3 kg (208 lb)   06/03/23 97.1 kg (214 lb 1.6 oz)   05/31/23 (!) 101.9 kg (224 lb 9.6 oz)     No LMP recorded. Patient has had a hysterectomy.    General: very sleepy. well developed, no acute distress. See BMI.  Head: Normocephalic, Atraumatic  Eyes: Sclera and conjunctiva clear, no drainage.PERRL, EOMI bilaterally.  Ears: External ears normal, Canals clear,  TMs with normal light reflex  Mouth mucus membranes moist, posterior oropharynx without erythema or exudates..  Neck: Supple, normal ROM, no thyromegaly.  Lymph Nodes: No cervical or supraclavicular lymphadenopathy.  Skin: no obvious rash but areas of excoriation on extensor surfaces of arms and back and waist.   Cardiovascular: RRR, normal S1/S2, no murmurs,  rubs or gallops. No chest wall TTP.  Lungs: CTA bilaterally, without crackles/wheezes/rhonchi, good air movement, no increased WOB  Abdomen:  Soft, non-distended, non-tender, no HSM or masses. Normal bowel sounds.  Extremities: Trace bilateral edema, pulses 2 +  PT   Musculoskeletal: Normal tone UEs/LEs  Neurologic: CNs 2-12 grossly intact, balance normal. Gross motor/fine motor normal.  Psychiatry: Pleasant affect, well kept, very sleepy.     Imaging Studies:  No imaging ordered this visit.    Labs:  Recent Results (from the past 4 weeks)   Comprehensive Metabolic Panel    Collection Time: 05/31/23  1:40 PM   Result Value Ref Range    Sodium 140 135 - 145 mmol/L    Potassium 3.7 3.4 - 4.8 mmol/L    Chloride 102 98 - 107 mmol/L    CO2 27.1 20.0 - 31.0 mmol/L    Anion Gap 11 5 - 14 mmol/L    BUN 9 9 - 23 mg/dL    Creatinine 2.44 0.10 - 1.02 mg/dL BUN/Creatinine Ratio 9     eGFR CKD-EPI (2021) Female 70 >=60 mL/min/1.68m2    Glucose 95 70 - 179 mg/dL    Calcium 9.0 8.7 - 27.2 mg/dL    Albumin 2.7 (L) 3.4 - 5.0 g/dL    Total Protein 6.3 5.7 - 8.2 g/dL    Total Bilirubin 2.3 (H) 0.3 - 1.2 mg/dL    AST 47 (H) <=53 U/L    ALT 27 10 - 49 U/L    Alkaline Phosphatase 104 46 - 116 U/L   Magnesium    Collection Time: 05/31/23  1:40 PM   Result Value Ref Range    Magnesium 1.5 (L) 1.6 - 2.6 mg/dL   Lipase    Collection Time: 05/31/23  1:40 PM   Result Value Ref Range    Lipase 54 (H) 12 - 53 U/L   CBC w/ Differential    Collection Time: 05/31/23  1:40 PM   Result Value Ref Range    WBC 5.1 3.6 - 11.2 10*9/L    RBC 3.77 (L) 3.95 - 5.13 10*12/L    HGB 11.3 11.3 - 14.9 g/dL    HCT 66.4 (L) 40.3 - 44.0 %    MCV 87.3 77.6 - 95.7 fL    MCH 30.0 25.9 - 32.4 pg    MCHC 34.4 32.0 - 36.0 g/dL    RDW 47.4 25.9 - 56.3 %    MPV 9.7 6.8 - 10.7 fL    Platelet 51 (L) 150 - 450 10*9/L    nRBC 0 <=4 /100 WBCs    Neutrophils % 62.2 %    Lymphocytes % 21.4 %    Monocytes % 13.1 %    Eosinophils % 2.8 %    Basophils % 0.5 %    Absolute Neutrophils 3.2 1.8 - 7.8 10*9/L    Absolute Lymphocytes 1.1 1.1 - 3.6 10*9/L    Absolute Monocytes 0.7 0.3 - 0.8 10*9/L    Absolute Eosinophils 0.1 0.0 - 0.5 10*9/L    Absolute Basophils 0.0 0.0 - 0.1 10*9/L   hsTroponin I (single, no delta)    Collection Time: 05/31/23  1:40 PM   Result Value Ref Range    hsTroponin I <3 <=34 ng/L   PT-INR    Collection Time: 05/31/23  1:41 PM   Result Value Ref Range    PT 15.1 (H) 9.9 - 12.6 sec    INR 1.32    ECG 12 Lead  Collection Time: 05/31/23  1:45 PM   Result Value Ref Range    EKG Systolic BP  mmHg    EKG Diastolic BP  mmHg    EKG Ventricular Rate 85 BPM    EKG Atrial Rate 85 BPM    EKG P-R Interval 164 ms    EKG QRS Duration 76 ms    EKG Q-T Interval 402 ms    EKG QTC Calculation 478 ms    EKG Calculated P Axis 68 degrees    EKG Calculated R Axis 40 degrees    EKG Calculated T Axis 24 degrees    QTC Fredericia 451 ms   Urinalysis with Microscopy with Culture Reflex    Collection Time: 05/31/23  2:07 PM   Result Value Ref Range    Color, UA Yellow     Clarity, UA Turbid     Specific Gravity, UA 1.028 1.003 - 1.030    pH, UA 5.5 5.0 - 9.0    Leukocyte Esterase, UA Moderate (A) Negative    Nitrite, UA Negative Negative    Protein, UA 50 mg/dL (A) Negative    Glucose, UA Negative Negative    Ketones, UA Trace (A) Negative    Urobilinogen, UA 6.0 mg/dL (A) <8.4 mg/dL    Bilirubin, UA Negative Negative    Blood, UA Negative Negative    RBC, UA 3 <=4 /HPF    WBC, UA 60 (H) 0 - 5 /HPF    Squam Epithel, UA 12 (H) 0 - 5 /HPF    Bacteria, UA Many (A) None Seen /HPF    Granular Casts, UA 8 (H) <=0 /LPF    Mucus, UA Many (A) None Seen /HPF   Urine Culture    Collection Time: 05/31/23  2:07 PM    Specimen: Clean Catch; Urine   Result Value Ref Range    Urine Culture, Comprehensive >100,000 CFU/mL Escherichia coli (A)        Susceptibility    Escherichia coli - MIC SUSCEPTIBILITY RESULT     Ampicillin  Resistant      Ampicillin + Sulbactam  Resistant      Cefazolin  Intermediate      Cephalexin*  Susceptible       * For uncomplicated UTI's only.     Ceftazidime  Susceptible      Ceftriaxone  Susceptible      Ciprofloxacin  Susceptible      Gentamicin  Susceptible      Levofloxacin  Susceptible      Nitrofurantoin  Susceptible      Piperacillin + Tazobactam  Susceptible      Tetracycline*  Susceptible       * Organisms that test susceptible to tetracycline are considered susceptible to doxycycline.However, some organisms that test intermediate or resistant to tetracycline may be susceptible to doxycycline.     Tobramycin  Susceptible      Trimethoprim + Sulfamethoxazole  Susceptible    Body fluid cell count    Collection Time: 05/31/23  5:31 PM   Result Value Ref Range    Fluid Type Fluid, Peritoneal     Color, Fluid Light Yellow     Appearance, Fluid Clear     Nucleated Cells, Fluid 96 ul    RBC, Fluid 188 ul    Lymphocytes %, Fluid 20.0 %    Mono/Macro % , Fluid 77.0 %    Other Cells %, Fluid 3.0 %    #Cells Counted BF Diff 100  Fluid Comments       Mesothelial Cells Present  Erythrophagocytosis Present   Glucose, Body Fluid    Collection Time: 05/31/23  5:31 PM   Result Value Ref Range    Glucose, Fluid 102 Undefined mg/dL    Glucose, Fluid Type Fluid, Peritoneal, Peritoneum    Protein, Body Fluid    Collection Time: 05/31/23  5:31 PM   Result Value Ref Range    Protein, Fluid <2.0 g/dL    Protein, Fluid Type Fluid, Peritoneal, Peritoneum    Albumin Level, Body Fluid    Collection Time: 05/31/23  5:31 PM   Result Value Ref Range    Albumin, Fluid <0.5 Undefined g/dL    Albumin, Fluid Type Fluid, Peritoneal, Peritoneum    Ascitic/Peritoneal Fluid Culture    Collection Time: 05/31/23  5:32 PM    Specimen: Peritoneum; Fluid, Peritoneal   Result Value Ref Range    Ascitic/Peritoneal Culture NO GROWTH     Gram Stain Result Direct Specimen Gram Stain     Gram Stain Result 1+ Polymorphonuclear leukocytes     Gram Stain Result No organisms seen    Cytology - Fluid    Collection Time: 05/31/23  5:34 PM   Result Value Ref Range    Diagnosis       A. Peritoneal fluid, cytology  - No malignant cells identified  - Reactive mesothelial cells, macrophages, and lymphocytes    This electronic signature is attestation that the pathologist personally reviewed the submitted material(s) and the final diagnosis reflects that evaluation.      Clinical History       57 y.o. 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    Resident Physician: None Assigned      EMBEDDED IMAGES      Specimen Adequacy Satisfactory for evaluation     Disclaimer       Unless otherwise specified, specimens are preserved using 10% neutral buffered formalin. For cases in which immunohistochemical and/or in-situ hybridization stains are performed, the following statement applies: Appropriate controls for each stain (positive controls with or without negative controls) have been evaluated and stain as expected. These stains have not been separately validated for use on decalcified specimens and should be interpreted with caution in that setting. Some of the reagents used for these stains may be classified as analyte specific reagents (ASR). Tests using ASRs were developed, and their performance characteristics were determined, by the Anatomic Pathology Department [facility]). They have not been cleared or approved by the [place] Food and Drug Administration (FDA). The FDA does not require these tests to go through premarket FDA review. These tests are used for clinical purposes. They should not be regarded as investigational or for research. This laboratory is certified under the Clinical Laboratory Improvement Amendments (CLIA) as qualified to perform high complexity clinical laboratory testing.     POCT Glucose    Collection Time: 05/31/23  5:41 PM   Result Value Ref Range    Glucose, POC 75  70 - 179 mg/dL   Albumin    Collection Time: 05/31/23  5:42 PM   Result Value Ref Range    Albumin 2.4 (L) 3.4 - 5.0 g/dL   Pro-BNP    Collection Time: 05/31/23  5:42 PM   Result Value Ref Range    PRO-BNP 105.0 <=300.0 pg/mL   Urinalysis with Microscopy with Culture Reflex    Collection Time: 05/31/23  6:52 PM   Result Value Ref Range    Color, UA Yellow     Clarity, UA Clear     Specific Gravity, UA 1.200 (H) 1.003 - 1.030    pH, UA 6.0 5.0 - 9.0    Leukocyte Esterase, UA Negative Negative    Nitrite, UA Negative Negative    Protein, UA Trace (A) Negative    Glucose, UA Negative Negative    Ketones, UA Negative Negative    Urobilinogen, UA 2.0 mg/dL (A) <1.6 mg/dL    Bilirubin, UA Negative Negative    Blood, UA Negative Negative    RBC, UA 17 (H) <=4 /HPF    WBC, UA 49 (H) 0 - 5 /HPF    Squam Epithel, UA 12 (H) 0 - 5 /HPF    Bacteria, UA Many (A) None Seen /HPF    Mucus, UA Rare (A) None Seen /HPF   Urine Culture    Collection Time: 05/31/23  6:52 PM    Specimen: Clean Catch; Urine   Result Value Ref Range    Urine Culture, Comprehensive >100,000 CFU/mL Escherichia coli (A)    POCT Glucose    Collection Time: 05/31/23  8:09 PM   Result Value Ref Range    Glucose, POC 106 70 - 179 mg/dL   Comprehensive Metabolic Panel    Collection Time: 06/01/23  7:31 AM   Result Value Ref Range    Sodium 143 135 - 145 mmol/L    Potassium 3.8 3.4 - 4.8 mmol/L    Chloride 103 98 - 107 mmol/L    CO2 29.3 20.0 - 31.0 mmol/L    Anion Gap 11 5 - 14 mmol/L    BUN 8 (L) 9 - 23 mg/dL    Creatinine 1.09 6.04 - 1.02 mg/dL    BUN/Creatinine Ratio 8     eGFR CKD-EPI (2021) Female 68 >=60 mL/min/1.78m2    Glucose 110 70 - 179 mg/dL    Calcium 9.2 8.7 - 54.0 mg/dL    Albumin 2.3 (L) 3.4 - 5.0 g/dL    Total Protein 5.3 (L) 5.7 - 8.2 g/dL    Total Bilirubin 1.5 (H) 0.3 - 1.2 mg/dL    AST 45 (H) <=98 U/L    ALT 23 10 - 49 U/L    Alkaline Phosphatase 78 46 - 116 U/L   CBC w/ Differential    Collection Time: 06/01/23  7:31 AM   Result Value Ref Range    WBC 3.2 (L) 3.6 - 11.2 10*9/L    RBC 3.26 (L) 3.95 - 5.13 10*12/L    HGB 9.6 (L) 11.3 - 14.9 g/dL    HCT 11.9 (L) 14.7 - 44.0 %    MCV 87.3 77.6 - 95.7 fL    MCH 29.4 25.9 - 32.4 pg    MCHC 33.7 32.0 - 36.0 g/dL    RDW 82.9 56.2 - 13.0 %    MPV 10.2 6.8 - 10.7 fL    Platelet 41 (L) 150 - 450 10*9/L    nRBC 0 <=4 /100 WBCs    Neutrophils %  52.3 %    Lymphocytes % 28.1 %    Monocytes % 14.4 %    Eosinophils % 4.4 %    Basophils % 0.8 %    Absolute Neutrophils 1.7 (L) 1.8 - 7.8 10*9/L    Absolute Lymphocytes 0.9 (L) 1.1 - 3.6 10*9/L Absolute Monocytes 0.5 0.3 - 0.8 10*9/L    Absolute Eosinophils 0.1 0.0 - 0.5 10*9/L    Absolute Basophils 0.0 0.0 - 0.1 10*9/L   Magnesium Level    Collection Time: 06/01/23  7:31 AM   Result Value Ref Range    Magnesium 1.7 1.6 - 2.6 mg/dL   PT-INR    Collection Time: 06/01/23  7:31 AM   Result Value Ref Range    PT 15.6 (H) 9.9 - 12.6 sec    INR 1.37    Hemoglobin A1c    Collection Time: 06/01/23  7:31 AM   Result Value Ref Range    Hemoglobin A1C 5.2 4.8 - 5.6 %    Estimated Average Glucose 103 mg/dL   Basic Metabolic Panel    Collection Time: 06/01/23  3:53 PM   Result Value Ref Range    Sodium 141 135 - 145 mmol/L    Potassium 4.3 3.4 - 4.8 mmol/L    Chloride 101 98 - 107 mmol/L    CO2 29.3 20.0 - 31.0 mmol/L    Anion Gap 11 5 - 14 mmol/L    BUN 7 (L) 9 - 23 mg/dL    Creatinine 1.61 0.96 - 1.02 mg/dL    BUN/Creatinine Ratio 7     eGFR CKD-EPI (2021) Female 67 >=60 mL/min/1.50m2    Glucose 125 70 - 179 mg/dL    Calcium 9.3 8.7 - 04.5 mg/dL   Comprehensive Metabolic Panel    Collection Time: 06/02/23  6:01 AM   Result Value Ref Range    Sodium 144 135 - 145 mmol/L    Potassium 4.0 3.4 - 4.8 mmol/L    Chloride 102 98 - 107 mmol/L    CO2 31.3 (H) 20.0 - 31.0 mmol/L    Anion Gap 11 5 - 14 mmol/L    BUN 8 (L) 9 - 23 mg/dL    Creatinine 4.09 8.11 - 1.02 mg/dL    BUN/Creatinine Ratio 8     eGFR CKD-EPI (2021) Female 68 >=60 mL/min/1.32m2    Glucose 116 70 - 179 mg/dL    Calcium 9.7 8.7 - 91.4 mg/dL    Albumin 2.7 (L) 3.4 - 5.0 g/dL    Total Protein 6.2 5.7 - 8.2 g/dL    Total Bilirubin 2.4 (H) 0.3 - 1.2 mg/dL    AST 50 (H) <=78 U/L    ALT 26 10 - 49 U/L    Alkaline Phosphatase 89 46 - 116 U/L   Magnesium Level    Collection Time: 06/02/23  6:01 AM   Result Value Ref Range    Magnesium 1.6 1.6 - 2.6 mg/dL   PT-INR    Collection Time: 06/02/23  6:01 AM   Result Value Ref Range    PT 15.6 (H) 9.9 - 12.6 sec    INR 1.37    CBC w/ Differential    Collection Time: 06/02/23  6:01 AM   Result Value Ref Range    WBC 5.4 3.6 - 11.2 10*9/L    RBC 3.69 (L) 3.95 - 5.13 10*12/L    HGB 11.1 (L) 11.3 - 14.9 g/dL    HCT 29.5 (L) 62.1 - 44.0 %  MCV 87.1 77.6 - 95.7 fL    MCH 30.1 25.9 - 32.4 pg    MCHC 34.6 32.0 - 36.0 g/dL    RDW 16.1 09.6 - 04.5 %    MPV 10.2 6.8 - 10.7 fL    Platelet 51 (L) 150 - 450 10*9/L    nRBC 0 <=4 /100 WBCs    Neutrophils % 58.1 %    Lymphocytes % 24.8 %    Monocytes % 12.8 %    Eosinophils % 3.6 %    Basophils % 0.7 %    Absolute Neutrophils 3.2 1.8 - 7.8 10*9/L    Absolute Lymphocytes 1.3 1.1 - 3.6 10*9/L    Absolute Monocytes 0.7 0.3 - 0.8 10*9/L    Absolute Eosinophils 0.2 0.0 - 0.5 10*9/L    Absolute Basophils 0.0 0.0 - 0.1 10*9/L   Basic Metabolic Panel    Collection Time: 06/02/23  1:53 PM   Result Value Ref Range    Sodium 141 135 - 145 mmol/L    Potassium 3.5 3.4 - 4.8 mmol/L    Chloride 99 98 - 107 mmol/L    CO2 35.2 (H) 20.0 - 31.0 mmol/L    Anion Gap 7 5 - 14 mmol/L    BUN 8 (L) 9 - 23 mg/dL    Creatinine 4.09 8.11 - 1.02 mg/dL    BUN/Creatinine Ratio 9     eGFR CKD-EPI (2021) Female 74 >=60 mL/min/1.64m2    Glucose 118 70 - 179 mg/dL    Calcium 9.1 8.7 - 91.4 mg/dL   Comprehensive Metabolic Panel    Collection Time: 06/03/23  7:59 AM   Result Value Ref Range    Sodium 145 135 - 145 mmol/L    Potassium 4.0 3.4 - 4.8 mmol/L    Chloride 102 98 - 107 mmol/L    CO2 34.7 (H) 20.0 - 31.0 mmol/L    Anion Gap 8 5 - 14 mmol/L    BUN 10 9 - 23 mg/dL    Creatinine 7.82 (H) 0.55 - 1.02 mg/dL    BUN/Creatinine Ratio 10     eGFR CKD-EPI (2021) Female 62 >=60 mL/min/1.44m2    Glucose 105 70 - 179 mg/dL    Calcium 8.9 8.7 - 95.6 mg/dL    Albumin 2.3 (L) 3.4 - 5.0 g/dL    Total Protein 5.5 (L) 5.7 - 8.2 g/dL    Total Bilirubin 1.8 (H) 0.3 - 1.2 mg/dL    AST 44 (H) <=21 U/L    ALT 21 10 - 49 U/L    Alkaline Phosphatase 79 46 - 116 U/L   Magnesium Level    Collection Time: 06/03/23  7:59 AM   Result Value Ref Range    Magnesium 1.6 1.6 - 2.6 mg/dL   CBC w/ Differential    Collection Time: 06/03/23  7:59 AM   Result Value Ref Range    WBC 3.7 3.6 - 11.2 10*9/L    RBC 3.29 (L) 3.95 - 5.13 10*12/L    HGB 9.9 (L) 11.3 - 14.9 g/dL    HCT 30.8 (L) 65.7 - 44.0 %    MCV 87.7 77.6 - 95.7 fL    MCH 30.1 25.9 - 32.4 pg    MCHC 34.4 32.0 - 36.0 g/dL    RDW 84.6 (H) 96.2 - 15.2 %    MPV 11.1 (H) 6.8 - 10.7 fL    Platelet 39 (L) 150 - 450 10*9/L    nRBC 0 <=4 /100 WBCs  Neutrophils % 56.5 %    Lymphocytes % 26.1 %    Monocytes % 13.1 %    Eosinophils % 3.3 %    Basophils % 1.0 %    Absolute Neutrophils 2.1 1.8 - 7.8 10*9/L    Absolute Lymphocytes 1.0 (L) 1.1 - 3.6 10*9/L    Absolute Monocytes 0.5 0.3 - 0.8 10*9/L    Absolute Eosinophils 0.1 0.0 - 0.5 10*9/L    Absolute Basophils 0.0 0.0 - 0.1 10*9/L   Basic Metabolic Panel    Collection Time: 06/11/23 11:55 AM   Result Value Ref Range    Glucose 147 (H) 70 - 99 mg/dL    BUN 16 6 - 24 mg/dL    Creatinine 1.61 (H) 0.57 - 1.00 mg/dL    BUN/Creatinine Ratio 16 9 - 23    Sodium 137 134 - 144 mmol/L    Potassium 4.1 3.5 - 5.2 mmol/L    Chloride 99 96 - 106 mmol/L    CO2 24 20 - 29 mmol/L    Calcium 9.6 8.7 - 10.2 mg/dL   CBC    Collection Time: 06/12/23  3:27 PM   Result Value Ref Range    WBC 4.6 3.6 - 11.2 10*9/L    RBC 3.56 (L) 3.95 - 5.13 10*12/L    HGB 10.5 (L) 11.3 - 14.9 g/dL    HCT 09.6 (L) 04.5 - 44.0 %    MCV 87.0 77.6 - 95.7 fL    MCH 29.6 25.9 - 32.4 pg    MCHC 34.0 32.0 - 36.0 g/dL    RDW 40.9 81.1 - 91.4 %    MPV 11.5 (H) 6.8 - 10.7 fL    Platelet 55 (L) 150 - 450 10*9/L   Basic metabolic panel    Collection Time: 06/12/23  3:27 PM   Result Value Ref Range    Sodium 140 135 - 145 mmol/L    Potassium 4.1 3.4 - 4.8 mmol/L    Chloride 100 98 - 107 mmol/L    CO2 30.3 20.0 - 31.0 mmol/L    Anion Gap 10 5 - 14 mmol/L    BUN 15 9 - 23 mg/dL    Creatinine 7.82 9.56 - 1.02 mg/dL    BUN/Creatinine Ratio 16     eGFR CKD-EPI (2021) Female 74 >=60 mL/min/1.67m2    Glucose 130 70 - 179 mg/dL    Calcium 9.5 8.7 - 21.3 mg/dL   Magnesium Level    Collection Time: 06/12/23  3:27 PM   Result Value Ref Range    Magnesium 1.9 1.6 - 2.6 mg/dL   Hepatic Function Panel    Collection Time: 06/12/23  3:27 PM   Result Value Ref Range    Albumin 2.7 (L) 3.4 - 5.0 g/dL    Total Protein 6.6 5.7 - 8.2 g/dL    Total Bilirubin 1.5 (H) 0.3 - 1.2 mg/dL    Bilirubin, Direct 0.86 (H) 0.00 - 0.30 mg/dL    AST 55 (H) <=57 U/L    ALT 31 10 - 49 U/L    Alkaline Phosphatase 125 (H) 46 - 116 U/L       Assessment & Plan: Pathophysiology of condition(s) reviewed with patient if applicable. Pt stated understanding and there were no barriers to learning.  SEE PATIENT INSTRUCTIONS BELOW FOR DISCUSSION/PLAN     Diagnosis ICD-10-CM Associated Orders   1. Type 2 diabetes mellitus without complication, without long-term current use of insulin (CMS-HCC)  E11.9  2. Ascites of liver  R18.8 CBC     Basic metabolic panel     Magnesium Level      3. 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 Thrombocytopenia (CMS-HCC)  D69.6 Hepatic Function Panel     Hepatic Function Panel      5. Hepatic encephalopathy (CMS-HCC)  K76.82       6. Elevated bilirubin  R17 Hepatic Function Panel     Hepatic Function Panel          Patient Instructions   SPECIFIC INSTRUCTIONS WE DISCUSSED TODAY:  Continue with current medications. We are looking into how you can get pill packs  I will send hepatology a message about whether or not you would benefit from ursodiol given your itching and mildly elevated bilirubin  Stay off of aspirin  Hepatic function panel added to labs ordered by hospital.    Thank you for choosing [facility] for your care!    If you have a question about a recent visit or a plan that was discussed with your provider in the past: Please go to [email or web address] (or your phone app) and sign in to your [facility] Chart to send this information. I do my best to respond to messages within 3 business days, but it is not always possible to provide the most timely and complete information. For more urgent questions, CALLING THE OFFICE is always the best option.    If you are experiencing any new or worsening symptoms or you would like to talk about changing a medication or medical plan; it is best to schedule an appointment: Please go onto My [facility] Chart call our clinic at [phone] to schedule an appointment with your provider or the Acute Care Clinic or you can access the [facility] on My [facility] Chart.  If you cannot decide if your issue warrants a visit, you can always ask to discuss this with one of our clinical care team.    If you have urgent healthcare needs after normal business hours, on weekends, or during holidays:  We have an Acute Care Clinic available by appointment. Call [phone] to schedule this. We offer care for health issues that do not require the emergency room.   Monday - Thursday (8am to 7pm)  Friday (8am to 5pm)   Saturdays (9am to 1pm)   You can always reach the [facility] 24/7 Nursing Line after hours to get nurse advice. The nurse can consult with the doctor on call if indicated. Just call our main office number and follow the prompts - [phone]     You may receive a patient satisfaction survey by mail, text or email regarding your visit today. Your opinion is important to me. Your response benefits [place] all :-)    If you had or will have testing done, test results will be posted on [facility] or sent via letter.  A member of our Care Team will also contact you by message or phone if your results require follow-up.Please note that specialty test results take longer. Lab results may post before I review them. We will follow up as needed when I review them.    Lorel Monaco. Olukemi Panchal, MD -   [facility]  [address], [place] [address]  Phone: [phone] Fax: [phone]  [email or web address]  Return in about 15 days (around 06/27/2023) for AS PREVIOUSLY SCHEDULED. Denise York    Lorel Monaco.Cathey Endow, MD  Family Physician    [facility]  [address]  [place], [place] [address]  Ph [phone]  Fax [phone]

## 2023-06-12 NOTE — Unmapped (Signed)
Patient Daughter [name]  has received a call to confirm an appointment that has been request by the provider.. The patient as agreed to the date/time provided by the scheduler.  06/17/23 at 1200 pm

## 2023-06-12 NOTE — Unmapped (Signed)
1st attempt to contact patient to schedule a appointment on  Dr. Eudelia Bunch schedule on 12/10@11  per Jill Side request.  No answer LVM informing patient to call the office at [phone].

## 2023-06-14 IMAGING — MG MM DIGITAL SCREENING BILAT W/ TOMO AND CAD
8 series · 8 of 24 positions shown · non-contrast
Comparison: Previous exam(s).

CLINICAL DATA: Screening.

EXAM:
DIGITAL SCREENING BILATERAL MAMMOGRAM WITH TOMOSYNTHESIS AND CAD
TECHNIQUE: Bilateral screening digital craniocaudal and mediolateral oblique
mammograms were obtained. Bilateral screening digital breast
tomosynthesis was performed. The images were evaluated with
computer-aided detection.

[L MLO synth-2D]
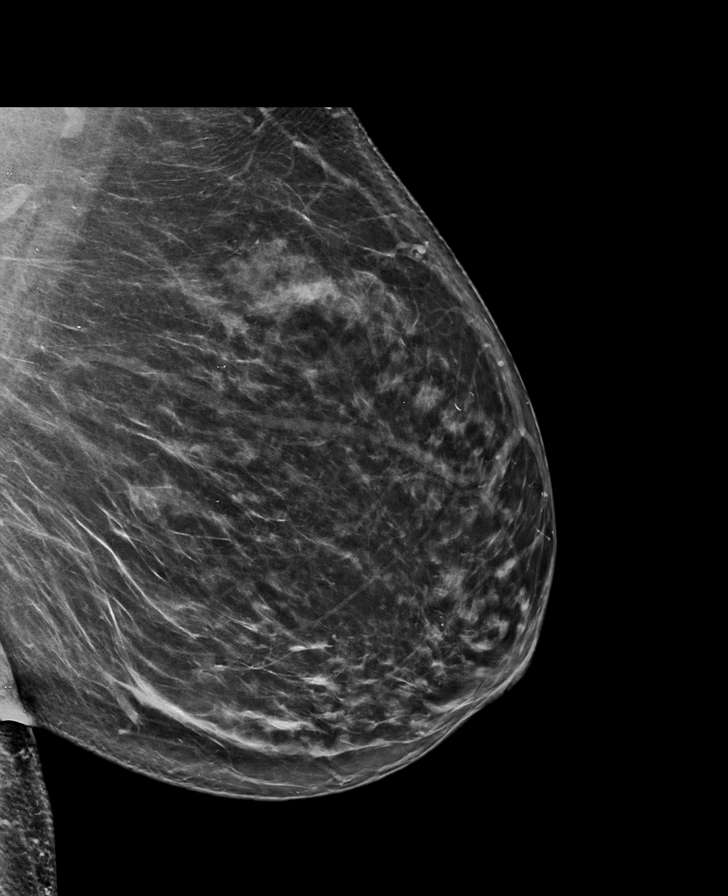

[R CC synth-2D]
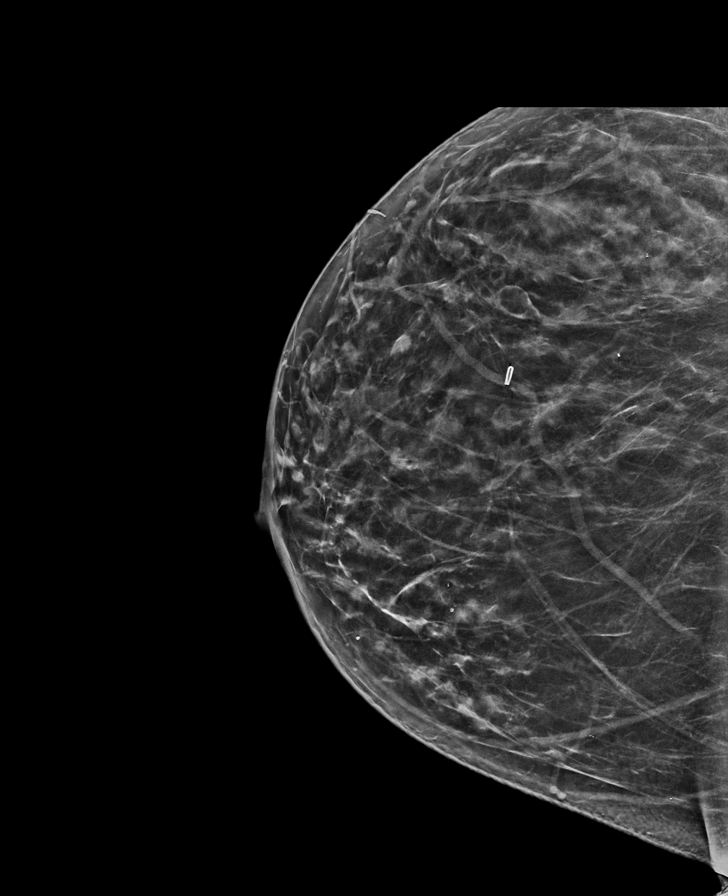

[R MLO synth-2D]
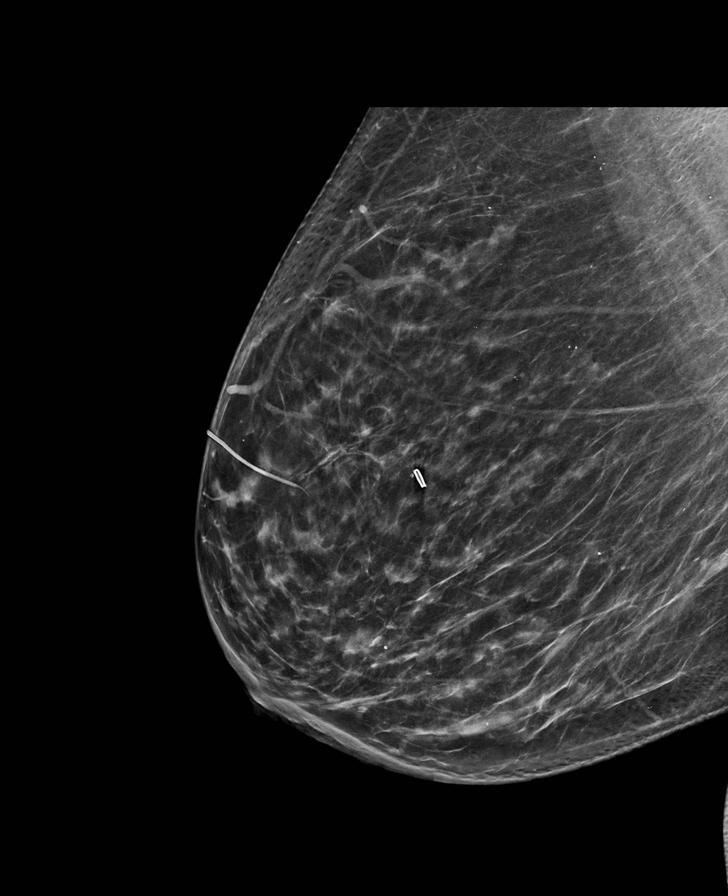

[L CC synth-2D]
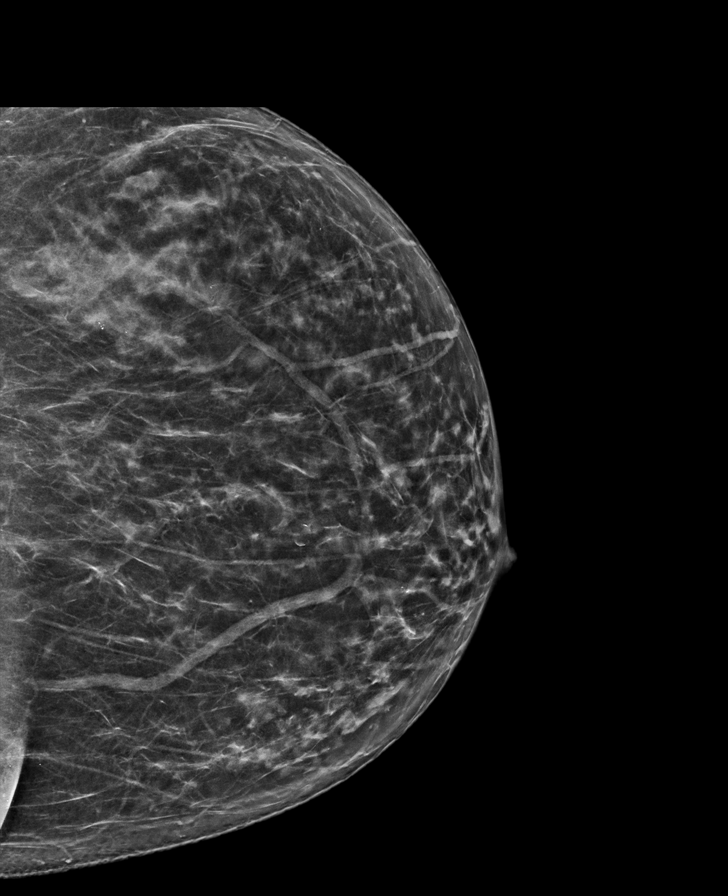

[L CC tomo · tomo slice 35/70.0]
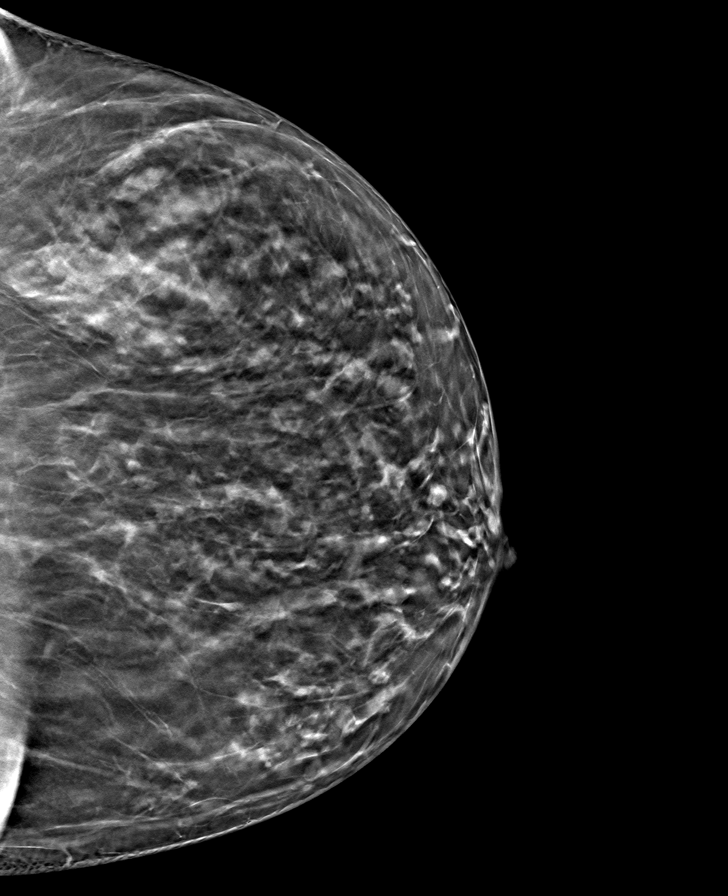

[L MLO tomo · tomo slice 43/84.0]
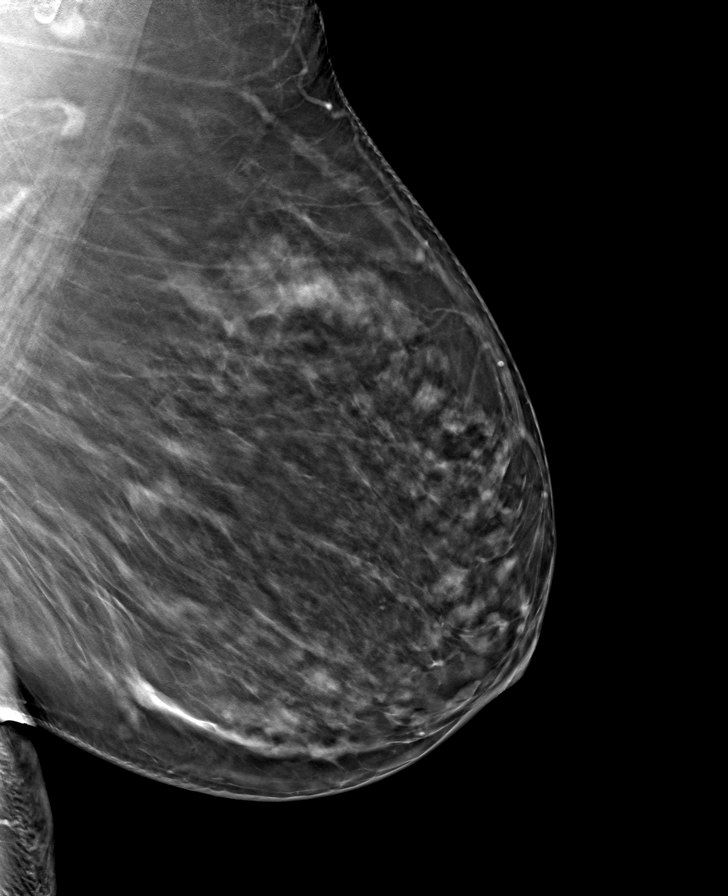

[R CC tomo · tomo slice 35/68.0]
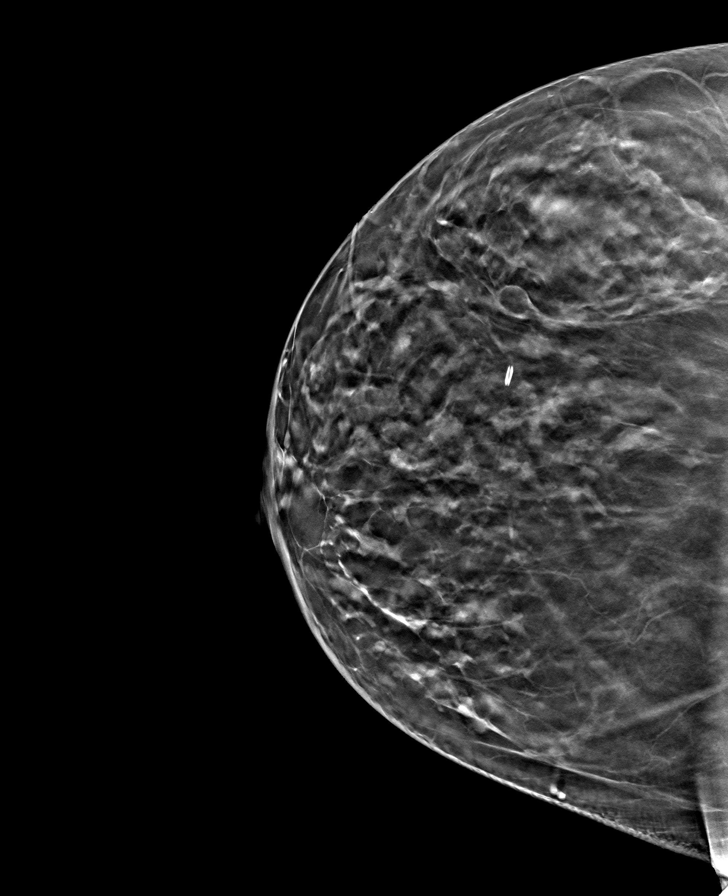

[R MLO tomo · tomo slice 39/77.0]
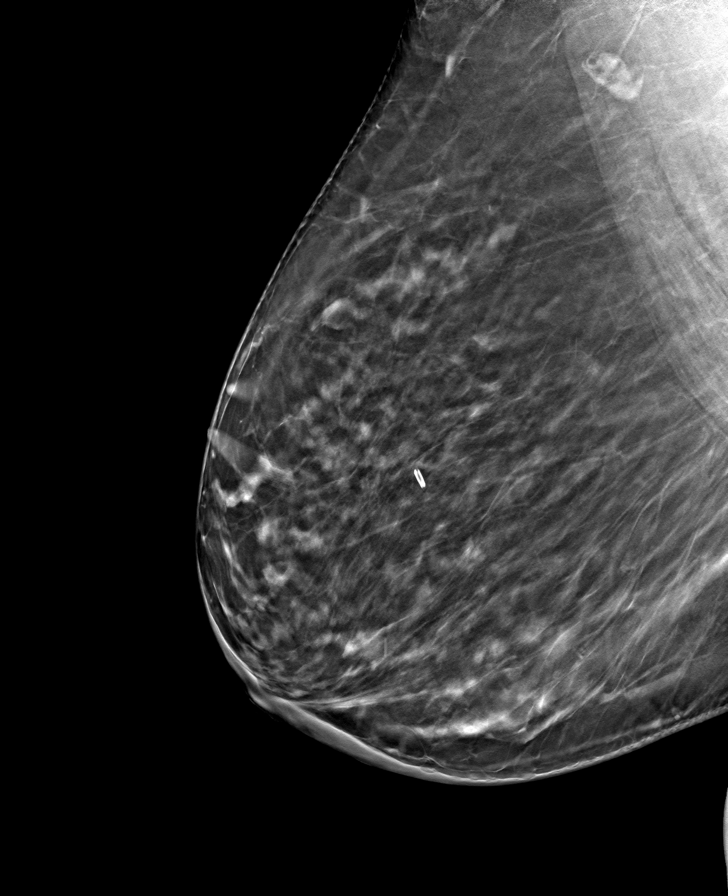

[8 of 24 positions shown; findings below may reference images not displayed]

ACR Breast Density Category b: There are scattered areas of
fibroglandular density.
FINDINGS: There are no findings suspicious for malignancy.
IMPRESSION: No mammographic evidence of malignancy. A result letter of this
screening mammogram will be mailed directly to the patient.

RECOMMENDATION:
Screening mammogram in one year. (Code:51-O-LD2)

BI-RADS CATEGORY  1: Negative.

## 2023-06-14 MED ORDER — ESCITALOPRAM 10 MG TABLET
ORAL_TABLET | Freq: Every day | ORAL | 3 refills | 90 days | Status: CP
Start: 2023-06-14 — End: 2024-06-13

## 2023-06-14 MED ORDER — ROPINIROLE 0.25 MG TABLET
ORAL_TABLET | Freq: Two times a day (BID) | ORAL | 3 refills | 90 days | Status: CP
Start: 2023-06-14 — End: 2024-06-13

## 2023-06-14 MED ORDER — CARVEDILOL 3.125 MG TABLET
ORAL_TABLET | Freq: Two times a day (BID) | ORAL | 3 refills | 90 days | Status: CP
Start: 2023-06-14 — End: 2024-06-13

## 2023-06-14 MED ORDER — SPIRONOLACTONE 50 MG TABLET
ORAL_TABLET | Freq: Every day | ORAL | 3 refills | 90 days | Status: CP
Start: 2023-06-14 — End: 2024-06-13

## 2023-06-14 MED ORDER — MAGNESIUM OXIDE 400 MG (241.3 MG MAGNESIUM) TABLET
ORAL_TABLET | Freq: Every day | ORAL | 3 refills | 90 days | Status: CP
Start: 2023-06-14 — End: 2024-06-13

## 2023-06-14 MED ORDER — OMEPRAZOLE 20 MG CAPSULE,DELAYED RELEASE
ORAL_CAPSULE | Freq: Two times a day (BID) | ORAL | 3 refills | 90 days | Status: CP
Start: 2023-06-14 — End: 2024-06-13

## 2023-06-14 MED ORDER — METFORMIN ER 500 MG TABLET,EXTENDED RELEASE 24 HR
ORAL_TABLET | Freq: Every day | ORAL | 3 refills | 90 days | Status: CP
Start: 2023-06-14 — End: ?

## 2023-06-14 MED ORDER — ATORVASTATIN 20 MG TABLET
ORAL_TABLET | Freq: Every day | ORAL | 3 refills | 90 days | Status: CP
Start: 2023-06-14 — End: 2024-06-13

## 2023-06-14 MED ORDER — MULTIVITAMIN TABLET
ORAL_TABLET | Freq: Every day | ORAL | 3 refills | 90 days | Status: CP
Start: 2023-06-14 — End: 2024-06-13

## 2023-06-14 MED ORDER — TRAZODONE 50 MG TABLET
ORAL_TABLET | Freq: Every evening | ORAL | 3 refills | 90 days | Status: CP
Start: 2023-06-14 — End: ?

## 2023-06-14 MED ORDER — LORATADINE 10 MG TABLET
ORAL_TABLET | Freq: Every day | ORAL | 1 refills | 90 days | Status: CP
Start: 2023-06-14 — End: ?

## 2023-06-17 ENCOUNTER — Ambulatory Visit: Admit: 2023-06-17 | Discharge: 2023-06-18 | Payer: PRIVATE HEALTH INSURANCE

## 2023-06-17 DIAGNOSIS — R188 Other ascites: Principal | ICD-10-CM

## 2023-06-17 DIAGNOSIS — K7210 Chronic hepatic failure without coma: Principal | ICD-10-CM

## 2023-06-17 DIAGNOSIS — K7469 Other cirrhosis of liver: Principal | ICD-10-CM

## 2023-06-17 MED ORDER — CARVEDILOL 6.25 MG TABLET
ORAL_TABLET | Freq: Two times a day (BID) | ORAL | 11 refills | 30.00 days | Status: CP
Start: 2023-06-17 — End: ?

## 2023-06-17 MED ORDER — NALTREXONE 50 MG TABLET
ORAL_TABLET | Freq: Every day | ORAL | 11 refills | 30.00 days | Status: CP
Start: 2023-06-17 — End: 2023-06-17

## 2023-06-17 NOTE — Unmapped (Signed)
[facility]  06/17/2023    Reason for visit: Follow up of Other cirrhosis of liver (CMS-HCC) [K74.69]    Assessment/Plan:    57 year old female presenting for follow up of decompensated MASH cirrhosis.  Her cirrhosis has been complicated by clinically significant portal hypertension and hepatic encephalopathy, and she recently developed ascites. Ascites has responded well to diuretics, and she has not required a therapeutic paracentesis. No history of jaundice of esophageal variceal bleeding. Her metabolic risk factors include obesity, type 2 diabetes mellitus, hypertension, and dyslipidemia.     She has indications for liver transplant but MELD is too low for a realistic chance at a deceased donor liver transplant. Her sisters may be interested in being evaluated as living donors. If so, we will proceed with transplant evaluation ([facility] clinic). Discussed need for her to continue activity as tolerated to avoid progressive frailty.    Other cirrhosis (CMS-HCC)  MELD 16 as of 11/26. Repeat MELD labs ordered; she plans to complete them 12/20 at time of PCP follow up.  Continue HCC surveillance every 6 months with ultrasound and AFP.  AFP normal today. [place] next due 05/2023.  Increase protein intake, goal 100 g/day. Discussed using protein supplements such as Glucerna.  Avoid NSAIDs.  Tylenol up to 2 g/day is okay.  Influenza vaccine administered today. She declined updated COVID-19 vaccine today but states she is amenable to vaccine if she proceeds with transplant evaluation.    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  Continue lactulose, titrated to produce 3-5 soft bowel movements per day.   Continue Xifaxan twice daily.  She was advised not to drive at all due to risk of traffic accidents.    Portal hypertension (CMS-HCC)  Other ascites (CMS-HCC)  Continue Lasix 80 mg daily and spironolactone 50 mg daily.  Limit sodium to 2000 mg daily.  She does not wish to increase diuretics at this time due to frequent urination.  May need to adjust dosing if she becomes hypokalemia (Lasix > spironolactone).   MRI to evaluate for PVT given recent development of ascites.    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  If she is unable to tolerate increased carvedilol dosing, recommend repeat EGD given recent decompensation.    Pruritus  Naltrexone 50 mg daily. Naltrexone chosen over colestipol given constipation history of need for 3 BMs/day on lactulose to prevent HE.    Lower extremity edema  Continue diuretics as noted above.  Limit sodium to 2000 mg daily.   Discussed use of compression stockings.    MASH  Continue weight loss efforts.    Continue management of metabolic risk factors with PCP.    Return in about 3 months (around 09/15/2023).    Subjective   History of Present Illness   Accompanied by: sister [name]    57 y.o. 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 Her cirrhosis has been complicated by clinically significant portal hypertension and hepatic encephalopathy. EGD 01/15/23, G1 EV and non bleeding gastric ulcers (H. Pylori negative). 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     Interval History  Last visit 03/27/2023. Developed ascites and was admitted 11/23-11/26/2024. Had diagnostic paracentesis, SAAG >1.1 consistent with portal hypertension. She has responded well to diuretics and has not required a therapeutic paracentesis.  Notes that she developed 25 lbs of fluid in 1 week, now 4 lbs above baseline. Currently taking Lasix 80 mg daily and spironolactone 50 mg daily. No encephalopathy today but noted intermittent difficulties with memory and word finding. Taking lactulose several times daily with approximately 3 stools per day. Also taking Xifaxan twice daily. Denies jaundice, abdominal pain, melena, or hematochezia. Notes mild lower extremity edema, improved on diuretics, worsened with periods of sitting. Recently developed itching and has excoriations with bleeding.    Objective   Physical Exam   Vital Signs: BP 122/63  - Pulse 77  - Temp 36.7 ??C (98 ??F) (Tympanic)  - Ht 165.1 cm (5' 5)  - Wt 93.8 kg (206 lb 14.4 oz)  - SpO2 100%  - BMI 34.43 kg/m??   Constitutional: She is in no apparent distress  Eyes: Anicteric sclerae  Cardiovascular: Mild peripheral edema bilaterally  Gastrointestinal: Soft, non-tender, non-distended abdomen with splenomegaly  Neurologic: Awake, alert, and oriented to person, place, and time with normal speech; mild asterixis    Lab Results   Component Value Date    WBC 4.6 06/12/2023    HGB 10.5 (L) 06/12/2023    HCT 31.0 (L) 06/12/2023    PLT 55 (L) 06/12/2023       Lab Results   Component Value Date    NA 140 06/12/2023    K 4.1 06/12/2023    CL 100 06/12/2023    CO2 30.3 06/12/2023    BUN 15 06/12/2023    CREATININE 0.91 06/12/2023    GLU 130 06/12/2023    CALCIUM 9.5 06/12/2023    MG 1.9 06/12/2023     Lab Results   Component Value Date    BILITOT 1.5 (H) 06/12/2023    BILIDIR 0.60 (H) 06/12/2023    PROT 6.6 06/12/2023    ALBUMIN 2.7 (L) 06/12/2023    ALT 31 06/12/2023    AST 55 (H) 06/12/2023    ALKPHOS 125 (H) 06/12/2023     Lab Results   Component Value Date    PT 15.6 (H) 06/02/2023    INR 1.37 06/02/2023     MELD 3.0: 16 at 06/03/2023  7:59 AM  Calculated from:  Serum Creatinine: 1.05 mg/dL at 16/04/9603  5:40 AM  Serum Sodium: 145 mmol/L (Using max of 137 mmol/L) at 06/03/2023  7:59 AM  Total Bilirubin: 1.8 mg/dL at 98/05/9146  8:29 AM  Serum Albumin: 2.3 g/dL at 56/21/3086  5:78 AM  INR(ratio): 1.37 at 06/02/2023  6:01 AM  Age at listing (hypothetical): 57 years  Sex: Female at 06/03/2023  7:59 AM    I personally spent 55 minutes face-to-face and non-face-to-face in the care of this patient, which includes all pre, intra, and post visit time on the date of service.

## 2023-06-17 NOTE — Unmapped (Addendum)
[facility]  [facility]  Dorene Sorrow, [place]   [facility]  [address]  [place], [place] [address]  Main Clinic: [phone]  Appointment Schedulers: [phone]  Iver Nestle, RN: [phone]  Fax: [phone]       Thank you for allowing me to participate in your medical care today. Here are my recommendations based on today's visit:    Increase carvedilol to 6.25 mg twice daily.  Start naltrexone 50 mg daily to help with itching.  Call [phone] option 1 to schedule your MRI at [facility]  Continue Lasix 80 mg once daily and spironolactone 50 mg once daily.  Limit salt (sodium) intake to less than 2000 mg per day to help with swelling. You can use compression stockings.  Eat a high protein diet (1.2-1.5 grams per kg body weight per day, typically about 100 grams of protein a day). Eat small, frequent meals, high-protein late-night snacks, and use protein supplements such as Glucerna.  Adjust lactulose dose or frequency to produce a target of 3-4 soft bowel movements a day. Continue Xifaxan twice daily.  Avoid NSAIDs (ibuprofen, naproxen, Advil, Motrin, Aleve, Naprosyn, etc). Acetaminophen (Tylenol) up to 2000 mg a day is ok      Carvedilol:  The reason to use this medication is a to lower your pulse and the blood pressure in the blood vessels around your liver. When the pressure in these blood vessels is high, it caused a back-up of pressure and makes the veins in your esophagus have high pressure. This increases the risk of the blood vessels in your esophagus bursting and causing a catastrophic bleeding episode.      Your provider has prescribed a medication to lower the pressure in these blood vessels. The drug is classified as a non-selective Beta blocker.  Because its job is to lower the blood pressure in the major veins, it slows your heart rate, or pulse. It is important to monitor your pulse rate to make sure that it does not get too low.     Monitor pulse daily to make sure that your pulse stays within the desired range. The desired range is 60 to 70 beats per minute.   If your pulse is 60 or above, continue taking your medication.  If your pulse goes to 55 or less, you need to hold your next dose of medicine and check your pulse again the next day.   If your pulse is still 55 or less, call the Liver Center office to report this.                              Take care,  Dorene Sorrow, PA

## 2023-06-17 NOTE — Unmapped (Signed)
Mailbox is full.  Called to reschedule to an earlier appointment with a resident

## 2023-06-17 NOTE — Unmapped (Signed)
On June 17, 2023 received referral from [place]en, Heywood Footman, * and  Dr. Eudelia Bunch for liver transplant evaluation. Denise York is a 57 y.o. female with diagnosis of End Stage Liver Disease secondary to  [facility]   Acceptable referral.  Referral entered in EPIC. Request will be sent to the [facility] for insurance verification.

## 2023-06-18 NOTE — Unmapped (Signed)
Received referral to verify patients [employer] has been verified.      Patient has active coverage with [facility] [employer] [place], including prescription drug coverage via [employer] [phone].    Authorization is not required for Liver transplant evaluation     Patient is financially cleared for Liver transplant evaluation

## 2023-06-18 NOTE — Unmapped (Incomplete)
 SPECIFIC INSTRUCTIONS WE DISCUSSED TODAY:  ***  Thank you for choosing [facility] for your care!    If you have a question about a recent visit or a plan that was discussed with your provider in the past: Please go to [email or web address] (or your phone app) and sign in to your [facility] Chart to send this information. I do my best to respond to messages within 3 business days, but it is not always possible to provide the most timely and complete information. For more urgent questions, CALLING THE OFFICE is always the best option.    If you are experiencing any new or worsening symptoms or you would like to talk about changing a medication or medical plan; it is best to schedule an appointment: Please go onto My [facility] Chart call our clinic at [phone] to schedule an appointment with your provider or the Acute Care Clinic or you can access the [facility] on My [facility] Chart.  If you cannot decide if your issue warrants a visit, you can always ask to discuss this with one of our clinical care team.    If you have urgent healthcare needs after normal business hours, on weekends, or during holidays:  We have an Acute Care Clinic available by appointment. Call [phone] to schedule this. We offer care for health issues that do not require the emergency room.   Monday - Thursday (8am to 7pm)  Friday (8am to 5pm)   Saturdays (9am to 1pm)   You can always reach the [facility] 24/7 Nursing Line after hours to get nurse advice. The nurse can consult with the doctor on call if indicated. Just call our main office number and follow the prompts - [phone]     You may receive a patient satisfaction survey by mail, text or email regarding your visit today. Your opinion is important to me. Your response benefits [place] all :-)    If you had or will have testing done, test results will be posted on [facility] or sent via letter.  A member of our Care Team will also contact you by message or phone if your results require follow-up.Please note that specialty test results take longer. Lab results may post before I review them. We will follow up as needed when I review them.    Lorel Monaco. Bowen, MD -   [facility] - [facility]  [address], [place] [address]  Phone: [phone] Fax: [phone]  [email or web address]

## 2023-06-18 NOTE — Unmapped (Unsigned)
 No chief complaint on file.      History of Present Illness:  Denise York is a 57 y.o. female with past medical history as below presents today for her wellness exam.  ***    This patients last WCC/CPE date: : Not Found  This patient's last AWV date: [facility] Last Medicare Wellness Visit Date: Not Found    Current Providers   Patient Care Team:  Cathey Endow Heywood Footman, MD as PCP - General (Family Medicine)  Cathey Endow, Heywood Footman, MD as PCP - Jori Moll, PA as Physician Assistant (Gastroenterology)  Jacquelyne Balint, MD as Consulting Physician (Cardiovascular Disease)  Fix, Alfonso Ellis, MD as Evaluating Hepatologist (Gastroenterology)  Jobie Quaker, RN as Transplant Coordinator (Transplant)  Smiley Houseman as Case Manager/Social Worker (Transplant)  Elease Etienne as Transplant Financial Coordinator (Transplant)  Bowen, Heywood Footman, MD as Referring Physician (Family Medicine)  Diket, Jake Bathe, PhD as Transplant Psychologist (Transplant Surgery)    Past Medical History:   Diagnosis Date    Back pain     Depressed     Fatigue     Joint pain     Neoplasm of right breast, 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    Obesity     Prediabetes      Patient Active Problem List   Diagnosis    Insomnia    Vitamin deficiency    Dysthymic disorder    Restless leg syndrome    Urinary frequency    Incomplete bladder emptying    Nonspecific abnormal results of liver function study    Edema    Vitamin D deficiency    Itch    Fatty liver disease, nonalcoholic    Constipation    Depression    Neoplasm of right breast, 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    Type 2 diabetes mellitus without complication (CMS-HCC)    Bowel incontinence    Mixed incontinence urge and stress    Encounter for long-term current use of medication    Obesity    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    Back pain    Neoplasm of uncertain behavior    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)    Portal hypertension (CMS-HCC)    Hepatic encephalopathy (CMS-HCC)    Thrombocytopenia (CMS-HCC)    Melena    Lower extremity edema    ASD (atrial septal defect)    Nausea and vomiting    Nonobstructive atherosclerosis of coronary artery    Ascites of liver     OB History       Gravida   2    Para   2    Term   2    Preterm        AB        Living   0         SAB        IAB        Ectopic        Molar        Multiple        Live Births   2              Past Surgical History:   Procedure Laterality Date    ABDOMINAL WALL MESH  REMOVAL  1997, 2005    Placement, 1997, removal and replacement  2005    BLADDER SURGERY      bladder tact X2    BREAST BIOPSY Right 04/2013    benign    BREAST EXCISIONAL BIOPSY Right 05/2013    benign    BREAST SURGERY Right     precancerous spot removed    CHOLECYSTECTOMY      HYSTERECTOMY      IR EMBOLIZATION VENOUS OTHER THAN HEMORRHAGE  02/17/2023    IR EMBOLIZATION VENOUS OTHER THAN HEMORRHAGE 02/17/2023 Ammie Dalton, MD IMG VIR [facility]    PR COLSC FLX W/RMVL OF TUMOR POLYP LESION SNARE TQ N/A 10/14/2016    Procedure: COLONOSCOPY FLEX; W/REMOV TUMOR/LES BY SNARE;  Surgeon: Alfred Levins, MD;  Location: [facility] [place];  Service: Gastroenterology    PR COLSC FLX W/RMVL OF TUMOR POLYP LESION SNARE TQ N/A 01/15/2023    Procedure: COLONOSCOPY FLEX; W/REMOV TUMOR/LES BY SNARE;  Surgeon: Mickie Hillier, MD;  Location: [facility];  Service: Gastroenterology    PR PERC CLOS,CONG INTERATRIAL COMMUN W/IMPL N/A 09/12/2022    Procedure: ASD closure;  Surgeon: Jacquelyne Balint, MD;  Location: [facility] CATH;  Service: Cardiology    PR UPPER GI ENDOSCOPY,BIOPSY N/A 01/15/2023    Procedure: UGI ENDOSCOPY; WITH BIOPSY, SINGLE OR MULTIPLE;  Surgeon: Mickie Hillier, MD;  Location: [facility];  Service: Gastroenterology    SKIN BIOPSY         Allergies:  Penicillins, Shellfish containing products, Glipizide, and Lisinopril    Current Outpatient Medications   Medication Sig Dispense Refill    atorvastatin (LIPITOR) 20 MG tablet Take 1 tablet (20 mg total) by mouth daily. 90 tablet 3    blood sugar diagnostic (GLUCOSE BLOOD) Strp Use to test blood sugar daily 100 strip 3    blood-glucose meter kit Use to test blood sugar DAILY 1 each 1    carvedilol (COREG) 6.25 MG tablet Take 1 tablet (6.25 mg total) by mouth two (2) times a day. 60 tablet 11    [START ON 07/10/2023] carvedilol (COREG) 6.25 MG tablet Take 1 tablet (6.25 mg total) by mouth two (2) times a day. Please add to pill pack 60 tablet 11    cholecalciferol, vitamin D3, 2,000 unit cap Take 2 capsules (4,000 Units total) by mouth daily. 1 each 0    escitalopram oxalate (LEXAPRO) 10 MG tablet Take 1 tablet (10 mg total) by mouth daily. 90 tablet 3    furosemide (LASIX) 80 MG tablet Take 1 tablet (80 mg total) by mouth daily. 30 tablet 0    inhalational spacing device (AEROCHAMBER MV) Spcr Use as directed with inhalers 1 each 0    lancets Misc Use to test blood sugar daily 100 each 3    loratadine (CLARITIN) 10 mg tablet Take 1 tablet (10 mg total) by mouth daily. 90 tablet 1    magnesium oxide (MAG-OX) 400 mg (241.3 mg elemental magnesium) tablet Take 1 tablet (400 mg total) by mouth daily. 90 tablet 3    metFORMIN (GLUCOPHAGE-XR) 500 MG 24 hr tablet Take 1 tablet (500 mg total) by mouth daily with evening meal. 90 tablet 3    multivitamin (TAB-A-VITE/THERAGRAN) per tablet Take 1 tablet by mouth daily. 90 tablet 3    naltrexone (DEPADE) 50 mg tablet Take 1 tablet (50 mg total) by mouth daily. 30 tablet 11    [START ON 07/10/2023] naltrexone (DEPADE) 50 mg tablet Take 1 tablet (50 mg total) by mouth daily. Please add to pill packs  30 tablet 11    omeprazole (PRILOSEC) 20 MG capsule Take 1 capsule (20 mg total) by mouth Two (2) times a day (30 minutes before a meal). 180 capsule 3    psyllium seed, with sugar, (FIBER ORAL) Take 4 capsules by mouth daily. [employer]Js brand.      rifAXIMin (XIFAXAN) 550 mg Tab Take 1 tablet (550 mg total) by mouth two (2) times a day. 180 tablet 3    rOPINIRole (REQUIP) 0.25 MG tablet Take 1 tablet (0.25 mg total) by mouth two (2) times a day. 180 tablet 3    semaglutide (OZEMPIC) 2 mg/dose (8 mg/3 mL) PnIj Inject 2 mg under the skin every seven (7) days. 9 mL 3    spironolactone (ALDACTONE) 50 MG tablet Take 1 tablet (50 mg total) by mouth daily. 90 tablet 3    traZODone (DESYREL) 50 MG tablet Take 1 tablet (50 mg total) by mouth nightly. 90 tablet 3     No current facility-administered medications for this visit.     Social History     Socioeconomic History    Marital status: Married   Tobacco Use    Smoking status: Never     Passive exposure: Past    Smokeless tobacco: Never   Vaping Use    Vaping status: Never Used   Substance and Sexual Activity    Alcohol use: No    Drug use: No    Sexual activity: Not Currently   Other Topics Concern    Do you use sunscreen? Yes    Excessive sun exposure? Yes   Social History Narrative    2 births. No living children. Married new husband 2014.         02/21/2017        PCMH Components:        Family, social, cultural characteristics: social support includes best friend .      Patient has the following communication needs: none, per patient     Health Literacy: How confident are you that you understand your health issues/concerns, can participate in your care, and manage your care along with your physician: confident.    Behaviors Affecting Health: none, per patient    Family history of mental health illness and/or substance abuse: asked patient/parent and none disclosed.    Have you been seen by any medical provider that we have not referred you to since your last visit ? No    Discussed a Living Will with the patient and XBJ:[id number] is under the age of 56.      Social Drivers of Health     Food Insecurity: No Food Insecurity (06/12/2023)    Hunger Vital Sign     Worried About Running Out of Food in the Last Year: Never true     Ran Out of Food in the Last Year: Never true   Transportation Needs: No Transportation Needs (06/12/2023)    PRAPARE - Therapist, art (Medical): No     Lack of Transportation (Non-Medical): No     Family History   Problem Relation Age of Onset    Heart disease Mother     Hypertension Mother     Heart disease Father     Kidney disease Father     Allergies Father     Gout Father     Allergies Other         FAMILY H/O    Coronary artery disease Other  FAMILY H/O    Kidney failure Other         FAMILY H/O    No Known Problems Sister     No Known Problems Daughter     No Known Problems Maternal Grandmother     No Known Problems Maternal Grandfather     No Known Problems Paternal Grandmother     No Known Problems Paternal Grandfather     BRCA 1/2 Neg Hx     Breast cancer Neg Hx     Cancer Neg Hx     Colon cancer Neg Hx     Endometrial cancer Neg Hx     Ovarian cancer Neg Hx     Substance Abuse Disorder Neg Hx     Mental illness Neg Hx      Immunization History   Administered Date(s) Administered    COVID-19 VAC,MRNA,TRIS(12Y UP)(PFIZER)(GRAY CAP) 08/19/2020    COVID-19 VACC,MRNA,(PFIZER)(PF) 10/02/2019, 10/23/2019    Covid-19 Vac, (71yr+) (Comirnaty) [employer]  04/08/2022    HEPATITIS B VACCINE ADULT, ADJUVANTED, IM(HEPLISAV B) 06/17/2022, 09/26/2022    Hepatitis A (Adult) 06/17/2022, 09/26/2022    INFLUENZA INJ MDCK PF, QUAD,(FLUCELVAX)(56MO AND UP EGG FREE) 05/15/2021    INFLUENZA TIV (TRI) PF (IM)(HISTORICAL) 04/09/2011    INFLUENZA VACCINE IIV3(IM)(PF)6 MOS UP 06/12/2023    Influenza TRI (IIV3) 5+yrs MDV 06/04/2013    Influenza Vaccine Quad(IM)6 MO-Adult(PF) 05/08/2017, 04/09/2018, 03/11/2019    Influenza Virus Vaccine, unspecified formulation 04/08/2015, 04/07/2016, 05/04/2020, 04/08/2022    PNEUMOCOCCAL POLYSACCHARIDE 23-VALENT 07/19/2014    Pneumococcal Conjugate 20-valent 06/17/2022    SHINGRIX-ZOSTER VACCINE (HZV),RECOMBINANT,ADJUVANTED(IM) 11/09/2020, 05/15/2021    TdaP 07/16/2011, 06/17/2022       Health Maintenance   Topic Date Due    COVID-19 Vaccine (5 - 2024-25 season) 03/09/2023    Foot Exam  05/09/2023    Urine Albumin/Creatinine Ratio  05/09/2023    Retinal Eye Exam  10/02/2023    Mammogram  10/28/2023    Hemoglobin A1c  11/29/2023    Serum Creatinine Monitoring  06/11/2024    Potassium Monitoring  06/11/2024    Colon Cancer Screening  01/15/2028    DTaP/Tdap/Td Vaccines (3 - Td or Tdap) 06/17/2032    Pneumococcal Vaccine 0-64  Completed    Hepatitis C Screen  Completed    Influenza Vaccine  Completed    Zoster Vaccines  Completed       I have reviewed and (if needed) updated the patient's problem list, medications, allergies, past medical and surgical history, social and family history ***    ROS  Constitutional: {lmbrosconst:29718::Denies excessive fatigue, fever, chills, sweats, or lightheadedness.}  Eyes: {lmbroseyes:29719::Denies blurred or changing vision, eye pain, or irritation.}  ENT: {lmbrosent:29720::Denies ear pain, changes in hearing, nasal/sinus problems, sneezing, or sore throat.}  Respiratory: {lmbrosresp:29722::Denies cough, SOB, wheezing, or DOE.}  Cardiovascular: {lmbroscardio:29723::Denies chest pain/pressure, palpitations, syncope, or edema.}  Gastrointestinal: {lmbrosgi:29724::Denies nausea/vomiting, constipation, abdominal pain, bloody stool, diarrhea, or reflux.}  Breast: {lmbrosbreast:29743::Denies breast pain, breast lumps, nipple discharge, or other breast changes.}  Genitourinary:{lmbrosgufemale:29728::Denies urinary frequency or urgency, dysuria,  hematuria, vaginal discharge, vaginal irritation/itching, irregular menses (if menstruating), painful menses, problems with sex}  Musculoskeletal: {lmbrosmusc:29731::Denies new pain or swelling in joints, or new or unusual muscle pain}  Skin/Nails/Hair: {lmbrosderm:29733::Denies rash, itching, hair loss, new/changing lesions, or slow-healing wounds.}  Neurological: {lmbrosneuro:29735::Denies numbness, tingling, weakness, balance problems, or new headaches}  Mental Health: {lmbrospsych:29736::Denies sleep disturbance, depression, anxiety, suicidal ideation, or substance abuse.}  Endocrine: {lmbrosendo:29739::Denies excessive urination or thirst, unexpected weight change, or heat/cold intolerance.}  Hematologic: {lmbrosheme:29740::Denies unusual bleeding/bruising or lymphadenopathy.}  Allergy: {lmbrosimmuno:29741::Denies seasonal symptoms, sneezing, itchy/watery eyes, hives, or lip swelling.}    Physical Exam:  Vital Signs:  There were no vitals filed for this visit.      There is no height or weight on file to calculate BMI.  Wt Readings from Last 3 Encounters:   06/17/23 93.8 kg (206 lb 14.4 oz)   06/12/23 94.3 kg (208 lb)   06/03/23 97.1 kg (214 lb 1.6 oz)     No LMP recorded. Patient has had a hysterectomy.    GEN- Well appearing, alert, in NAD, See BMI  HEAD- Normocephalic and atraumatic  EYES- No exophthalmos. EOMI. Lids- No crusting. Conjunctiva- clear. Sclera- anicteric. PERRLA  EARS- Auricles, EACs, TMs normal. Hearing grossly intact.   NOSE/MOUTH/THROAT- No nasal deformity.  No nasal discharge. Mucous membranes moist. Posterior OP clear  NECK- Supple w/o adenopathy, thyromegaly or masses  LYMPH-  No enlarged cervical or supraclavicular lymph nodes.  CHEST- clear to auscultation, no wheezes, rales or rhonchi, symmetric air entry, no chest wall deformity  HEART- RRR w/o murmurs, gallops or rubs  VASCULAR- radial and pedal pulses intact.  *** edema.  ABD- soft, non-distended, non-tender, no organomegaly or masses    BREASTS- Breasts symmetric, no masses, no skin or nipple changes, no axillary nodes  GU- {lmbfemaleexam:31363}    BACK/SPINE- no kyphosis, lordosis, scoliosis  MS- no joint tenderness, deformity or swelling  SKIN- no rashes or concerning skin lesions on visible skin  NEURO- normal speech, no focal findings  PSYCH - no unusual anxiety or signs of depression; alert and oriented    Recent Results (from the past 4 weeks)   Comprehensive Metabolic Panel    Collection Time: 05/31/23  1:40 PM   Result Value Ref Range    Sodium 140 135 - 145 mmol/L    Potassium 3.7 3.4 - 4.8 mmol/L    Chloride 102 98 - 107 mmol/L    CO2 27.1 20.0 - 31.0 mmol/L    Anion Gap 11 5 - 14 mmol/L    BUN 9 9 - 23 mg/dL    Creatinine 4.13 2.44 - 1.02 mg/dL    BUN/Creatinine Ratio 9     eGFR CKD-EPI (2021) Female 70 >=60 mL/min/1.63m2    Glucose 95 70 - 179 mg/dL    Calcium 9.0 8.7 - 01.0 mg/dL    Albumin 2.7 (L) 3.4 - 5.0 g/dL    Total Protein 6.3 5.7 - 8.2 g/dL    Total Bilirubin 2.3 (H) 0.3 - 1.2 mg/dL    AST 47 (H) <=27 U/L    ALT 27 10 - 49 U/L    Alkaline Phosphatase 104 46 - 116 U/L   Magnesium    Collection Time: 05/31/23  1:40 PM   Result Value Ref Range    Magnesium 1.5 (L) 1.6 - 2.6 mg/dL   Lipase    Collection Time: 05/31/23  1:40 PM   Result Value Ref Range    Lipase 54 (H) 12 - 53 U/L   CBC w/ Differential    Collection Time: 05/31/23  1:40 PM   Result Value Ref Range    WBC 5.1 3.6 - 11.2 10*9/L    RBC 3.77 (L) 3.95 - 5.13 10*12/L    HGB 11.3 11.3 - 14.9 g/dL    HCT 25.3 (L) 66.4 - 44.0 %    MCV 87.3 77.6 - 95.7 fL    MCH 30.0 25.9 - 32.4 pg    MCHC  34.4 32.0 - 36.0 g/dL    RDW 86.5 78.4 - 69.6 %    MPV 9.7 6.8 - 10.7 fL    Platelet 51 (L) 150 - 450 10*9/L    nRBC 0 <=4 /100 WBCs    Neutrophils % 62.2 %    Lymphocytes % 21.4 %    Monocytes % 13.1 %    Eosinophils % 2.8 %    Basophils % 0.5 %    Absolute Neutrophils 3.2 1.8 - 7.8 10*9/L    Absolute Lymphocytes 1.1 1.1 - 3.6 10*9/L    Absolute Monocytes 0.7 0.3 - 0.8 10*9/L    Absolute Eosinophils 0.1 0.0 - 0.5 10*9/L    Absolute Basophils 0.0 0.0 - 0.1 10*9/L   hsTroponin I (single, no delta)    Collection Time: 05/31/23  1:40 PM   Result Value Ref Range    hsTroponin I <3 <=34 ng/L   PT-INR    Collection Time: 05/31/23  1:41 PM   Result Value Ref Range    PT 15.1 (H) 9.9 - 12.6 sec    INR 1.32    ECG 12 Lead    Collection Time: 05/31/23  1:45 PM   Result Value Ref Range    EKG Systolic BP  mmHg    EKG Diastolic BP  mmHg    EKG Ventricular Rate 85 BPM    EKG Atrial Rate 85 BPM    EKG P-R Interval 164 ms    EKG QRS Duration 76 ms    EKG Q-T Interval 402 ms    EKG QTC Calculation 478 ms    EKG Calculated P Axis 68 degrees    EKG Calculated R Axis 40 degrees    EKG Calculated T Axis 24 degrees    QTC Fredericia 451 ms   Urinalysis with Microscopy with Culture Reflex    Collection Time: 05/31/23  2:07 PM   Result Value Ref Range    Color, UA Yellow     Clarity, UA Turbid     Specific Gravity, UA 1.028 1.003 - 1.030    pH, UA 5.5 5.0 - 9.0    Leukocyte Esterase, UA Moderate (A) Negative    Nitrite, UA Negative Negative    Protein, UA 50 mg/dL (A) Negative    Glucose, UA Negative Negative    Ketones, UA Trace (A) Negative    Urobilinogen, UA 6.0 mg/dL (A) <2.9 mg/dL    Bilirubin, UA Negative Negative    Blood, UA Negative Negative    RBC, UA 3 <=4 /HPF    WBC, UA 60 (H) 0 - 5 /HPF    Squam Epithel, UA 12 (H) 0 - 5 /HPF    Bacteria, UA Many (A) None Seen /HPF    Granular Casts, UA 8 (H) <=0 /LPF    Mucus, UA Many (A) None Seen /HPF   Urine Culture    Collection Time: 05/31/23  2:07 PM    Specimen: Clean Catch; Urine   Result Value Ref Range    Urine Culture, Comprehensive >100,000 CFU/mL Escherichia coli (A)        Susceptibility    Escherichia coli - MIC SUSCEPTIBILITY RESULT     Ampicillin  Resistant      Ampicillin + Sulbactam  Resistant      Cefazolin  Intermediate      Cephalexin*  Susceptible       * For uncomplicated UTI's only.     Ceftazidime  Susceptible      Ceftriaxone  Susceptible      Ciprofloxacin  Susceptible      Gentamicin  Susceptible      Levofloxacin  Susceptible      Nitrofurantoin  Susceptible      Piperacillin + Tazobactam  Susceptible      Tetracycline*  Susceptible       * Organisms that test susceptible to tetracycline are considered susceptible to doxycycline.However, some organisms that test intermediate or resistant to tetracycline may be susceptible to doxycycline.     Tobramycin  Susceptible      Trimethoprim + Sulfamethoxazole  Susceptible    Body fluid cell count    Collection Time: 05/31/23  5:31 PM   Result Value Ref Range    Fluid Type Fluid, Peritoneal     Color, Fluid Light Yellow     Appearance, Fluid Clear     Nucleated Cells, Fluid 96 ul    RBC, Fluid 188 ul    Lymphocytes %, Fluid 20.0 %    Mono/Macro % , Fluid 77.0 %    Other Cells %, Fluid 3.0 %    #Cells Counted BF Diff 100     Fluid Comments       Mesothelial Cells Present  Erythrophagocytosis Present   Glucose, Body Fluid    Collection Time: 05/31/23  5:31 PM   Result Value Ref Range    Glucose, Fluid 102 Undefined mg/dL    Glucose, Fluid Type Fluid, Peritoneal, Peritoneum    Protein, Body Fluid    Collection Time: 05/31/23  5:31 PM   Result Value Ref Range    Protein, Fluid <2.0 g/dL    Protein, Fluid Type Fluid, Peritoneal, Peritoneum    Albumin Level, Body Fluid    Collection Time: 05/31/23  5:31 PM   Result Value Ref Range    Albumin, Fluid <0.5 Undefined g/dL    Albumin, Fluid Type Fluid, Peritoneal, Peritoneum    Ascitic/Peritoneal Fluid Culture    Collection Time: 05/31/23  5:32 PM    Specimen: Peritoneum; Fluid, Peritoneal   Result Value Ref Range    Ascitic/Peritoneal Culture NO GROWTH     Gram Stain Result Direct Specimen Gram Stain     Gram Stain Result 1+ Polymorphonuclear leukocytes     Gram Stain Result No organisms seen    Cytology - Fluid    Collection Time: 05/31/23  5:34 PM   Result Value Ref Range    Diagnosis       A. Peritoneal fluid, cytology  - No malignant cells identified  - Reactive mesothelial cells, macrophages, and lymphocytes    This electronic signature is attestation that the pathologist personally reviewed the submitted material(s) and the final diagnosis reflects that evaluation.      Clinical History       57 y.o. 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    Resident Physician: None Assigned      EMBEDDED IMAGES      Specimen Adequacy Satisfactory for evaluation     Disclaimer  Unless otherwise specified, specimens are preserved using 10% neutral buffered formalin. For cases in which immunohistochemical and/or in-situ hybridization stains are performed, the following statement applies: Appropriate controls for each stain (positive controls with or without negative controls) have been evaluated and stain as expected. These stains have not been separately validated for use on decalcified specimens and should be interpreted with caution in that setting. Some of the reagents used for these stains may be classified as analyte specific reagents (ASR). Tests using ASRs were developed, and their performance characteristics were determined, by the Anatomic Pathology Department [facility]). They have not been cleared or approved by the [place] Food and Drug Administration (FDA). The FDA does not require these tests to go through premarket FDA review. These tests are used for clinical purposes. They should not be regarded as investigational or for research. This laboratory is certified under the Clinical Laboratory Improvement Amendments (CLIA) as qualified to perform high complexity clinical laboratory testing.     POCT Glucose    Collection Time: 05/31/23  5:41 PM   Result Value Ref Range    Glucose, POC 75 70 - 179 mg/dL   Albumin    Collection Time: 05/31/23  5:42 PM   Result Value Ref Range    Albumin 2.4 (L) 3.4 - 5.0 g/dL   Pro-BNP    Collection Time: 05/31/23  5:42 PM   Result Value Ref Range    PRO-BNP 105.0 <=300.0 pg/mL   Urinalysis with Microscopy with Culture Reflex    Collection Time: 05/31/23  6:52 PM   Result Value Ref Range    Color, UA Yellow     Clarity, UA Clear     Specific Gravity, UA 1.200 (H) 1.003 - 1.030    pH, UA 6.0 5.0 - 9.0    Leukocyte Esterase, UA Negative Negative    Nitrite, UA Negative Negative    Protein, UA Trace (A) Negative    Glucose, UA Negative Negative    Ketones, UA Negative Negative    Urobilinogen, UA 2.0 mg/dL (A) <1.6 mg/dL    Bilirubin, UA Negative Negative    Blood, UA Negative Negative    RBC, UA 17 (H) <=4 /HPF    WBC, UA 49 (H) 0 - 5 /HPF    Squam Epithel, UA 12 (H) 0 - 5 /HPF    Bacteria, UA Many (A) None Seen /HPF    Mucus, UA Rare (A) None Seen /HPF   Urine Culture    Collection Time: 05/31/23  6:52 PM    Specimen: Clean Catch; Urine   Result Value Ref Range    Urine Culture, Comprehensive >100,000 CFU/mL Escherichia coli (A)    POCT Glucose    Collection Time: 05/31/23  8:09 PM   Result Value Ref Range    Glucose, POC 106 70 - 179 mg/dL   Comprehensive Metabolic Panel    Collection Time: 06/01/23  7:31 AM   Result Value Ref Range    Sodium 143 135 - 145 mmol/L    Potassium 3.8 3.4 - 4.8 mmol/L    Chloride 103 98 - 107 mmol/L    CO2 29.3 20.0 - 31.0 mmol/L    Anion Gap 11 5 - 14 mmol/L    BUN 8 (L) 9 - 23 mg/dL    Creatinine 1.09 6.04 - 1.02 mg/dL    BUN/Creatinine Ratio 8     eGFR CKD-EPI (2021) Female 68 >=60 mL/min/1.30m2    Glucose 110 70 - 179 mg/dL  Calcium 9.2 8.7 - 10.4 mg/dL    Albumin 2.3 (L) 3.4 - 5.0 g/dL    Total Protein 5.3 (L) 5.7 - 8.2 g/dL    Total Bilirubin 1.5 (H) 0.3 - 1.2 mg/dL    AST 45 (H) <=13 U/L    ALT 23 10 - 49 U/L    Alkaline Phosphatase 78 46 - 116 U/L   CBC w/ Differential    Collection Time: 06/01/23  7:31 AM   Result Value Ref Range    WBC 3.2 (L) 3.6 - 11.2 10*9/L    RBC 3.26 (L) 3.95 - 5.13 10*12/L    HGB 9.6 (L) 11.3 - 14.9 g/dL    HCT 08.6 (L) 57.8 - 44.0 %    MCV 87.3 77.6 - 95.7 fL    MCH 29.4 25.9 - 32.4 pg    MCHC 33.7 32.0 - 36.0 g/dL    RDW 46.9 62.9 - 52.8 %    MPV 10.2 6.8 - 10.7 fL    Platelet 41 (L) 150 - 450 10*9/L    nRBC 0 <=4 /100 WBCs    Neutrophils % 52.3 %    Lymphocytes % 28.1 %    Monocytes % 14.4 %    Eosinophils % 4.4 %    Basophils % 0.8 %    Absolute Neutrophils 1.7 (L) 1.8 - 7.8 10*9/L    Absolute Lymphocytes 0.9 (L) 1.1 - 3.6 10*9/L    Absolute Monocytes 0.5 0.3 - 0.8 10*9/L    Absolute Eosinophils 0.1 0.0 - 0.5 10*9/L    Absolute Basophils 0.0 0.0 - 0.1 10*9/L   Magnesium Level    Collection Time: 06/01/23  7:31 AM   Result Value Ref Range    Magnesium 1.7 1.6 - 2.6 mg/dL   PT-INR    Collection Time: 06/01/23  7:31 AM   Result Value Ref Range    PT 15.6 (H) 9.9 - 12.6 sec    INR 1.37    Hemoglobin A1c    Collection Time: 06/01/23  7:31 AM   Result Value Ref Range    Hemoglobin A1C 5.2 4.8 - 5.6 %    Estimated Average Glucose 103 mg/dL   Basic Metabolic Panel    Collection Time: 06/01/23  3:53 PM   Result Value Ref Range    Sodium 141 135 - 145 mmol/L    Potassium 4.3 3.4 - 4.8 mmol/L    Chloride 101 98 - 107 mmol/L    CO2 29.3 20.0 - 31.0 mmol/L    Anion Gap 11 5 - 14 mmol/L    BUN 7 (L) 9 - 23 mg/dL    Creatinine 4.13 2.44 - 1.02 mg/dL    BUN/Creatinine Ratio 7     eGFR CKD-EPI (2021) Female 67 >=60 mL/min/1.39m2    Glucose 125 70 - 179 mg/dL    Calcium 9.3 8.7 - 01.0 mg/dL   Comprehensive Metabolic Panel    Collection Time: 06/02/23  6:01 AM   Result Value Ref Range    Sodium 144 135 - 145 mmol/L    Potassium 4.0 3.4 - 4.8 mmol/L    Chloride 102 98 - 107 mmol/L    CO2 31.3 (H) 20.0 - 31.0 mmol/L    Anion Gap 11 5 - 14 mmol/L    BUN 8 (L) 9 - 23 mg/dL    Creatinine 2.72 5.36 - 1.02 mg/dL    BUN/Creatinine Ratio 8     eGFR CKD-EPI (2021) Female 68 >=60 mL/min/1.15m2  Glucose 116 70 - 179 mg/dL    Calcium 9.7 8.7 - 24.4 mg/dL    Albumin 2.7 (L) 3.4 - 5.0 g/dL    Total Protein 6.2 5.7 - 8.2 g/dL    Total Bilirubin 2.4 (H) 0.3 - 1.2 mg/dL    AST 50 (H) <=01 U/L    ALT 26 10 - 49 U/L    Alkaline Phosphatase 89 46 - 116 U/L   Magnesium Level    Collection Time: 06/02/23  6:01 AM   Result Value Ref Range    Magnesium 1.6 1.6 - 2.6 mg/dL   PT-INR    Collection Time: 06/02/23  6:01 AM   Result Value Ref Range    PT 15.6 (H) 9.9 - 12.6 sec    INR 1.37    CBC w/ Differential    Collection Time: 06/02/23  6:01 AM   Result Value Ref Range    WBC 5.4 3.6 - 11.2 10*9/L    RBC 3.69 (L) 3.95 - 5.13 10*12/L    HGB 11.1 (L) 11.3 - 14.9 g/dL    HCT 02.7 (L) 25.3 - 44.0 %    MCV 87.1 77.6 - 95.7 fL    MCH 30.1 25.9 - 32.4 pg    MCHC 34.6 32.0 - 36.0 g/dL    RDW 66.4 40.3 - 47.4 %    MPV 10.2 6.8 - 10.7 fL    Platelet 51 (L) 150 - 450 10*9/L    nRBC 0 <=4 /100 WBCs    Neutrophils % 58.1 %    Lymphocytes % 24.8 %    Monocytes % 12.8 %    Eosinophils % 3.6 %    Basophils % 0.7 %    Absolute Neutrophils 3.2 1.8 - 7.8 10*9/L    Absolute Lymphocytes 1.3 1.1 - 3.6 10*9/L    Absolute Monocytes 0.7 0.3 - 0.8 10*9/L    Absolute Eosinophils 0.2 0.0 - 0.5 10*9/L    Absolute Basophils 0.0 0.0 - 0.1 10*9/L   Basic Metabolic Panel    Collection Time: 06/02/23  1:53 PM   Result Value Ref Range    Sodium 141 135 - 145 mmol/L    Potassium 3.5 3.4 - 4.8 mmol/L    Chloride 99 98 - 107 mmol/L    CO2 35.2 (H) 20.0 - 31.0 mmol/L    Anion Gap 7 5 - 14 mmol/L    BUN 8 (L) 9 - 23 mg/dL    Creatinine 2.59 5.63 - 1.02 mg/dL    BUN/Creatinine Ratio 9     eGFR CKD-EPI (2021) Female 74 >=60 mL/min/1.37m2    Glucose 118 70 - 179 mg/dL    Calcium 9.1 8.7 - 87.5 mg/dL   Comprehensive Metabolic Panel    Collection Time: 06/03/23  7:59 AM   Result Value Ref Range    Sodium 145 135 - 145 mmol/L    Potassium 4.0 3.4 - 4.8 mmol/L    Chloride 102 98 - 107 mmol/L    CO2 34.7 (H) 20.0 - 31.0 mmol/L    Anion Gap 8 5 - 14 mmol/L    BUN 10 9 - 23 mg/dL    Creatinine 6.43 (H) 0.55 - 1.02 mg/dL    BUN/Creatinine Ratio 10     eGFR CKD-EPI (2021) Female 62 >=60 mL/min/1.48m2    Glucose 105 70 - 179 mg/dL    Calcium 8.9 8.7 - 32.9 mg/dL    Albumin 2.3 (L) 3.4 - 5.0 g/dL    Total Protein 5.5 (  L) 5.7 - 8.2 g/dL    Total Bilirubin 1.8 (H) 0.3 - 1.2 mg/dL    AST 44 (H) <=16 U/L    ALT 21 10 - 49 U/L    Alkaline Phosphatase 79 46 - 116 U/L   Magnesium Level    Collection Time: 06/03/23  7:59 AM   Result Value Ref Range    Magnesium 1.6 1.6 - 2.6 mg/dL   CBC w/ Differential    Collection Time: 06/03/23  7:59 AM   Result Value Ref Range    WBC 3.7 3.6 - 11.2 10*9/L    RBC 3.29 (L) 3.95 - 5.13 10*12/L    HGB 9.9 (L) 11.3 - 14.9 g/dL    HCT 10.9 (L) 60.4 - 44.0 %    MCV 87.7 77.6 - 95.7 fL    MCH 30.1 25.9 - 32.4 pg    MCHC 34.4 32.0 - 36.0 g/dL    RDW 54.0 (H) 98.1 - 15.2 %    MPV 11.1 (H) 6.8 - 10.7 fL    Platelet 39 (L) 150 - 450 10*9/L    nRBC 0 <=4 /100 WBCs    Neutrophils % 56.5 %    Lymphocytes % 26.1 %    Monocytes % 13.1 %    Eosinophils % 3.3 %    Basophils % 1.0 %    Absolute Neutrophils 2.1 1.8 - 7.8 10*9/L    Absolute Lymphocytes 1.0 (L) 1.1 - 3.6 10*9/L    Absolute Monocytes 0.5 0.3 - 0.8 10*9/L    Absolute Eosinophils 0.1 0.0 - 0.5 10*9/L    Absolute Basophils 0.0 0.0 - 0.1 10*9/L   Basic Metabolic Panel    Collection Time: 06/11/23 11:55 AM   Result Value Ref Range    Glucose 147 (H) 70 - 99 mg/dL    BUN 16 6 - 24 mg/dL    Creatinine 1.91 (H) 0.57 - 1.00 mg/dL    BUN/Creatinine Ratio 16 9 - 23    Sodium 137 134 - 144 mmol/L    Potassium 4.1 3.5 - 5.2 mmol/L    Chloride 99 96 - 106 mmol/L    CO2 24 20 - 29 mmol/L    Calcium 9.6 8.7 - 10.2 mg/dL   CBC    Collection Time: 06/12/23  3:27 PM   Result Value Ref Range    WBC 4.6 3.6 - 11.2 10*9/L    RBC 3.56 (L) 3.95 - 5.13 10*12/L    HGB 10.5 (L) 11.3 - 14.9 g/dL    HCT 47.8 (L) 29.5 - 44.0 %    MCV 87.0 77.6 - 95.7 fL    MCH 29.6 25.9 - 32.4 pg    MCHC 34.0 32.0 - 36.0 g/dL    RDW 62.1 30.8 - 65.7 %    MPV 11.5 (H) 6.8 - 10.7 fL    Platelet 55 (L) 150 - 450 10*9/L   Basic metabolic panel    Collection Time: 06/12/23  3:27 PM   Result Value Ref Range    Sodium 140 135 - 145 mmol/L Potassium 4.1 3.4 - 4.8 mmol/L    Chloride 100 98 - 107 mmol/L    CO2 30.3 20.0 - 31.0 mmol/L    Anion Gap 10 5 - 14 mmol/L    BUN 15 9 - 23 mg/dL    Creatinine 8.46 9.62 - 1.02 mg/dL    BUN/Creatinine Ratio 16     eGFR CKD-EPI (2021) Female 74 >=60 mL/min/1.78m2    Glucose 130 70 -  179 mg/dL    Calcium 9.5 8.7 - 24.4 mg/dL   Magnesium Level    Collection Time: 06/12/23  3:27 PM   Result Value Ref Range    Magnesium 1.9 1.6 - 2.6 mg/dL   Hepatic Function Panel    Collection Time: 06/12/23  3:27 PM   Result Value Ref Range    Albumin 2.7 (L) 3.4 - 5.0 g/dL    Total Protein 6.6 5.7 - 8.2 g/dL    Total Bilirubin 1.5 (H) 0.3 - 1.2 mg/dL    Bilirubin, Direct 0.10 (H) 0.00 - 0.30 mg/dL    AST 55 (H) <=27 U/L    ALT 31 10 - 49 U/L    Alkaline Phosphatase 125 (H) 46 - 116 U/L        Assessment & Plan: Pathophysiology of condition(s) reviewed with patient if applicable. Pt stated understanding and there were no barriers to learning. *** SEE PATIENT INSTRUCTIONS BELOW FOR DISCUSSION/PLAN     Diagnosis ICD-10-CM Associated Orders   1. Type 2 diabetes mellitus without complication, without long-term current use of insulin (CMS-HCC)  E11.9           {lmbwaclabchoice:[id number]}  Health maintenance reviewed and recommendations made based on [place] Preventative Task Force (USPTF) recommendations. Reviewed appropriate diet and exercise. Marland Kitchen Recommend well adult check/physical in 1 year. Otherwise, follow up as below.    There are no Patient Instructions on file for this visit.    No follow-ups on file.        Lorel Monaco.Cathey Endow, MD  Family Physician  [facility]  [address]  [place], [place] [address]  Ph [phone]  Fax [phone]

## 2023-06-23 ENCOUNTER — Ambulatory Visit: Admit: 2023-06-23 | Discharge: 2023-06-24 | Payer: PRIVATE HEALTH INSURANCE

## 2023-06-23 MED ADMIN — gadopiclenol (ELUCIREM,VUEWAY) injection 9.3 mL: 9.3 mL | INTRAVENOUS | @ 19:00:00 | Stop: 2023-06-23

## 2023-06-25 NOTE — Unmapped (Signed)
Called patient to discuss liver transplant evaluation, living donors that have come forward, and to remind her to sign the consent.  Did not reach her, left VM.

## 2023-06-27 ENCOUNTER — Ambulatory Visit: Admit: 2023-06-27 | Discharge: 2023-06-28 | Payer: PRIVATE HEALTH INSURANCE

## 2023-06-27 DIAGNOSIS — K7469 Other cirrhosis of liver: Principal | ICD-10-CM

## 2023-06-27 DIAGNOSIS — R5383 Other fatigue: Principal | ICD-10-CM

## 2023-06-27 DIAGNOSIS — I8510 Secondary esophageal varices without bleeding: Principal | ICD-10-CM

## 2023-06-27 DIAGNOSIS — Z0000 Encounter for general adult medical examination without abnormal findings: Principal | ICD-10-CM

## 2023-06-27 DIAGNOSIS — E119 Type 2 diabetes mellitus without complications: Principal | ICD-10-CM

## 2023-06-27 DIAGNOSIS — G252 Other specified forms of tremor: Principal | ICD-10-CM

## 2023-06-27 LAB — COMPREHENSIVE METABOLIC PANEL
ALBUMIN: 3 g/dL — ABNORMAL LOW (ref 3.4–5.0)
ALKALINE PHOSPHATASE: 94 U/L (ref 46–116)
ALT (SGPT): 33 U/L (ref 10–49)
ANION GAP: 11 mmol/L (ref 5–14)
AST (SGOT): 57 U/L — ABNORMAL HIGH (ref <=34)
BILIRUBIN TOTAL: 2.2 mg/dL — ABNORMAL HIGH (ref 0.3–1.2)
BLOOD UREA NITROGEN: 16 mg/dL (ref 9–23)
BUN / CREAT RATIO: 17
CALCIUM: 9.9 mg/dL (ref 8.7–10.4)
CHLORIDE: 102 mmol/L (ref 98–107)
CO2: 30.8 mmol/L (ref 20.0–31.0)
CREATININE: 0.95 mg/dL (ref 0.55–1.02)
EGFR CKD-EPI (2021) FEMALE: 70 mL/min/{1.73_m2} (ref >=60)
GLUCOSE RANDOM: 141 mg/dL — ABNORMAL HIGH (ref 70–99)
POTASSIUM: 4.3 mmol/L (ref 3.4–4.8)
PROTEIN TOTAL: 7.1 g/dL (ref 5.7–8.2)
SODIUM: 144 mmol/L (ref 135–145)

## 2023-06-27 LAB — CBC
HEMATOCRIT: 32.9 % — ABNORMAL LOW (ref 34.0–44.0)
HEMOGLOBIN: 11.2 g/dL — ABNORMAL LOW (ref 11.3–14.9)
MEAN CORPUSCULAR HEMOGLOBIN CONC: 34 g/dL (ref 32.0–36.0)
MEAN CORPUSCULAR HEMOGLOBIN: 29.3 pg (ref 25.9–32.4)
MEAN CORPUSCULAR VOLUME: 86.1 fL (ref 77.6–95.7)
MEAN PLATELET VOLUME: 11.8 fL — ABNORMAL HIGH (ref 6.8–10.7)
PLATELET COUNT: 47 10*9/L — ABNORMAL LOW (ref 150–450)
RED BLOOD CELL COUNT: 3.82 10*12/L — ABNORMAL LOW (ref 3.95–5.13)
RED CELL DISTRIBUTION WIDTH: 14.8 % (ref 12.2–15.2)
WBC ADJUSTED: 4.5 10*9/L (ref 3.6–11.2)

## 2023-06-27 LAB — PROTIME-INR
INR: 1.33
PROTIME: 15.2 s — ABNORMAL HIGH (ref 9.9–12.6)

## 2023-06-27 LAB — SLIDE REVIEW

## 2023-06-27 LAB — AFP TUMOR MARKER: AFP-TUMOR MARKER: 4 ng/mL (ref <=8)

## 2023-06-30 DIAGNOSIS — K7210 Chronic hepatic failure without coma: Principal | ICD-10-CM

## 2023-06-30 DIAGNOSIS — Z7682 Awaiting organ transplant status: Principal | ICD-10-CM

## 2023-07-01 LAB — ZINC: ZINC: 45 ug/dL — ABNORMAL LOW

## 2023-07-01 NOTE — Unmapped (Addendum)
SPECIFIC INSTRUCTIONS WE DISCUSSED TODAY:  Keep track of lactulose dosing and stooling - you should be taking enough to have at least 3-5 stools a day  I am checking in with your liver specialists about when to restart ozempic  Take antibiotics as planned  Limit sodium to 2000 mg daily and increase protein in diet as much as possible - ideas include yogurt, eggs, chicken, fish, peanut butter (low sodium), protein shakes, protein bars.     Thank you for choosing [facility] for your care!    If you have a question about a recent visit or a plan that was discussed with your provider in the past: Please go to [email or web address] (or your phone app) and sign in to your [facility] Chart to send this information. I do my best to respond to messages within 3 business days, but it is not always possible to provide the most timely and complete information. For more urgent questions, CALLING THE OFFICE is always the best option.    If you are experiencing any new or worsening symptoms or you would like to talk about changing a medication or medical plan; it is best to schedule an appointment: Please go onto My [facility] Chart call our clinic at [phone] to schedule an appointment with your provider or the Acute Care Clinic or you can access the [facility] on My [facility] Chart.  If you cannot decide if your issue warrants a visit, you can always ask to discuss this with one of our clinical care team.    If you have urgent healthcare needs after normal business hours, on weekends, or during holidays:  We have an Acute Care Clinic available by appointment. Call [phone] to schedule this. We offer care for health issues that do not require the emergency room.   Monday - Thursday (8am to 7pm)  Friday (8am to 5pm)   Saturdays (9am to 1pm)   You can always reach the [facility] 24/7 Nursing Line after hours to get nurse advice. The nurse can consult with the doctor on call if indicated. Just call our main office number and follow the prompts - [phone]     You may receive a patient satisfaction survey by mail, text or email regarding your visit today. Your opinion is important to me. Your response benefits [place] all :-)    If you had or will have testing done, test results will be posted on [facility] or sent via letter.  A member of our Care Team will also contact you by message or phone if your results require follow-up.Please note that specialty test results take longer. Lab results may post before I review them. We will follow up as needed when I review them.    Lorel Monaco. Kert Shackett, MD -   [facility]  [address], [place] [address]  Phone: [phone] Fax: [phone]  [email or web address]

## 2023-07-03 MED ORDER — FUROSEMIDE 80 MG TABLET
ORAL_TABLET | Freq: Every day | ORAL | 1 refills | 30.00 days | Status: CP
Start: 2023-07-03 — End: 2023-08-02

## 2023-07-04 LAB — PI TYPING: ALPHA-1-ANTITRYPSIN: 150 mg/dL

## 2023-07-04 MED ORDER — FUROSEMIDE 80 MG TABLET
ORAL_TABLET | Freq: Every day | ORAL | 0 refills | 90.00 days | Status: CP
Start: 2023-07-04 — End: 2023-10-02

## 2023-07-04 NOTE — Unmapped (Signed)
Since this is the holidays and patient needs this medication taken care of, I am going to send in furosemide 80 mg daily to [facility] for blister packs but ultimately this should come from [facility]

## 2023-07-10 MED ORDER — CARVEDILOL 6.25 MG TABLET
ORAL_TABLET | Freq: Two times a day (BID) | ORAL | 11 refills | 30.00 days | Status: CP
Start: 2023-07-10 — End: ?

## 2023-07-10 MED ORDER — NALTREXONE 50 MG TABLET
ORAL_TABLET | Freq: Every day | ORAL | 11 refills | 30.00 days | Status: CP
Start: 2023-07-10 — End: ?

## 2023-07-12 ENCOUNTER — Inpatient Hospital Stay
Admit: 2023-07-12 | Discharge: 2023-07-13 | Disposition: A | Discharge status: Home / Self Care | Payer: PRIVATE HEALTH INSURANCE | Admitting: Student in an Organized Health Care Education/Training Program

## 2023-07-12 ENCOUNTER — Ambulatory Visit: Admit: 2023-07-12 | Discharge: 2023-07-13 | Payer: PRIVATE HEALTH INSURANCE

## 2023-07-12 LAB — CBC W/ AUTO DIFF
BASOPHILS ABSOLUTE COUNT: 0 10*9/L (ref 0.0–0.1)
BASOPHILS RELATIVE PERCENT: 0.5 %
EOSINOPHILS ABSOLUTE COUNT: 0.1 10*9/L (ref 0.0–0.5)
EOSINOPHILS RELATIVE PERCENT: 1.6 %
HEMATOCRIT: 33.7 % — ABNORMAL LOW (ref 34.0–44.0)
HEMOGLOBIN: 11.4 g/dL (ref 11.3–14.9)
LYMPHOCYTES ABSOLUTE COUNT: 1.4 10*9/L (ref 1.1–3.6)
LYMPHOCYTES RELATIVE PERCENT: 30.9 %
MEAN CORPUSCULAR HEMOGLOBIN CONC: 33.9 g/dL (ref 32.0–36.0)
MEAN CORPUSCULAR HEMOGLOBIN: 28.9 pg (ref 25.9–32.4)
MEAN CORPUSCULAR VOLUME: 85.3 fL (ref 77.6–95.7)
MEAN PLATELET VOLUME: 10.3 fL (ref 6.8–10.7)
MONOCYTES ABSOLUTE COUNT: 0.6 10*9/L (ref 0.3–0.8)
MONOCYTES RELATIVE PERCENT: 12 %
NEUTROPHILS ABSOLUTE COUNT: 2.6 10*9/L (ref 1.8–7.8)
NEUTROPHILS RELATIVE PERCENT: 55 %
NUCLEATED RED BLOOD CELLS: 0 /100{WBCs} (ref <=4)
PLATELET COUNT: 57 10*9/L — ABNORMAL LOW (ref 150–450)
RED BLOOD CELL COUNT: 3.95 10*12/L (ref 3.95–5.13)
RED CELL DISTRIBUTION WIDTH: 15.3 % — ABNORMAL HIGH (ref 12.2–15.2)
WBC ADJUSTED: 4.7 10*9/L (ref 3.6–11.2)

## 2023-07-12 LAB — BASIC METABOLIC PANEL
ANION GAP: 12 mmol/L (ref 5–14)
BLOOD UREA NITROGEN: 24 mg/dL — ABNORMAL HIGH (ref 9–23)
BUN / CREAT RATIO: 26
CALCIUM: 10.4 mg/dL (ref 8.7–10.4)
CHLORIDE: 103 mmol/L (ref 98–107)
CO2: 28.4 mmol/L (ref 20.0–31.0)
CREATININE: 0.92 mg/dL (ref 0.55–1.02)
EGFR CKD-EPI (2021) FEMALE: 73 mL/min/{1.73_m2} (ref >=60)
GLUCOSE RANDOM: 154 mg/dL (ref 70–179)
POTASSIUM: 4.3 mmol/L (ref 3.4–4.8)
SODIUM: 143 mmol/L (ref 135–145)

## 2023-07-12 LAB — BLOOD GAS CRITICAL CARE PANEL, VENOUS
BASE EXCESS VENOUS: 6.2 — ABNORMAL HIGH (ref -2.0–2.0)
CALCIUM IONIZED VENOUS (MG/DL): 5.26 mg/dL (ref 4.40–5.40)
GLUCOSE WHOLE BLOOD: 150 mg/dL (ref 54–400)
HCO3 VENOUS: 28 mmol/L — ABNORMAL HIGH (ref 22–27)
HEMOGLOBIN BLOOD GAS: 13.1 g/dL (ref 12.00–16.00)
LACTATE BLOOD VENOUS: 2 mmol/L — ABNORMAL HIGH (ref 0.5–1.8)
O2 SATURATION VENOUS: 43.6 % (ref 40.0–85.0)
PCO2 VENOUS: 48 mmHg (ref 40–60)
PH VENOUS: 7.41 (ref 7.32–7.43)
PO2 VENOUS: 28 mmHg — ABNORMAL LOW (ref 35–40)
POTASSIUM WHOLE BLOOD: 4 mmol/L (ref 3.4–4.6)
SODIUM WHOLE BLOOD: 142 mmol/L (ref 135–145)

## 2023-07-12 LAB — MAGNESIUM: MAGNESIUM: 1.9 mg/dL (ref 1.6–2.6)

## 2023-07-12 LAB — HEPATIC FUNCTION PANEL
ALBUMIN: 3.1 g/dL — ABNORMAL LOW (ref 3.4–5.0)
ALKALINE PHOSPHATASE: 102 U/L (ref 46–116)
ALT (SGPT): 46 U/L (ref 10–49)
AST (SGOT): 60 U/L — ABNORMAL HIGH (ref <=34)
BILIRUBIN DIRECT: 0.6 mg/dL — ABNORMAL HIGH (ref 0.00–0.30)
BILIRUBIN TOTAL: 1.5 mg/dL — ABNORMAL HIGH (ref 0.3–1.2)
PROTEIN TOTAL: 7.1 g/dL (ref 5.7–8.2)

## 2023-07-12 LAB — ETHANOL: ETHANOL: <10 mg/dL (ref <=10)

## 2023-07-12 LAB — PROTIME-INR
INR: 1.32
PROTIME: 15.1 s — ABNORMAL HIGH (ref 9.9–12.6)

## 2023-07-12 LAB — HIGH SENSITIVITY TROPONIN I - SINGLE: HIGH SENSITIVITY TROPONIN I: <3 ng/L (ref <=34)

## 2023-07-12 LAB — PHOSPHORUS: PHOSPHORUS: 3.2 mg/dL (ref 2.4–5.1)

## 2023-07-12 LAB — AMMONIA: AMMONIA: 101 umol/L — ABNORMAL HIGH (ref 11–32)

## 2023-07-12 NOTE — Unmapped (Signed)
Pt was diagnosed with liver disease last year. Most recently, has become confused since Christmas. Knows her name and where she is, but does not know the month or year. Increasingly sleepy and unable to stay awake.  Strength equal, but weakness in upper and lower extremities.    Last known normal 12/20 per sister     MD Izola Price at bedside.

## 2023-07-13 LAB — TOXICOLOGY SCREEN, URINE
AMPHETAMINE SCREEN URINE: NEGATIVE
BARBITURATE SCREEN URINE: NEGATIVE
BENZODIAZEPINE SCREEN, URINE: NEGATIVE
BUPRENORPHINE, URINE SCREEN: NEGATIVE
CANNABINOID SCREEN URINE: NEGATIVE
COCAINE(METAB.)SCREEN, URINE: NEGATIVE
FENTANYL SCREEN, URINE: NEGATIVE
METHADONE SCREEN, URINE: NEGATIVE
OPIATE SCREEN URINE: NEGATIVE
OXYCODONE SCREEN URINE: NEGATIVE

## 2023-07-13 LAB — COMPREHENSIVE METABOLIC PANEL
ALBUMIN: 2.6 g/dL — ABNORMAL LOW (ref 3.4–5.0)
ALKALINE PHOSPHATASE: 86 U/L (ref 46–116)
ALT (SGPT): 43 U/L (ref 10–49)
ANION GAP: 12 mmol/L (ref 5–14)
AST (SGOT): 55 U/L — ABNORMAL HIGH (ref <=34)
BILIRUBIN TOTAL: 1.4 mg/dL — ABNORMAL HIGH (ref 0.3–1.2)
BLOOD UREA NITROGEN: 21 mg/dL (ref 9–23)
BUN / CREAT RATIO: 24
CALCIUM: 9.5 mg/dL (ref 8.7–10.4)
CHLORIDE: 103 mmol/L (ref 98–107)
CO2: 28.2 mmol/L (ref 20.0–31.0)
CREATININE: 0.86 mg/dL (ref 0.55–1.02)
EGFR CKD-EPI (2021) FEMALE: 79 mL/min/{1.73_m2} (ref >=60)
GLUCOSE RANDOM: 114 mg/dL (ref 70–179)
POTASSIUM: 3.9 mmol/L (ref 3.5–5.1)
PROTEIN TOTAL: 6 g/dL (ref 5.7–8.2)
SODIUM: 143 mmol/L (ref 135–145)

## 2023-07-13 LAB — URINALYSIS WITH MICROSCOPY WITH CULTURE REFLEX PERFORMABLE
BILIRUBIN UA: NEGATIVE
BLOOD UA: NEGATIVE
GLUCOSE UA: NEGATIVE
NITRITE UA: NEGATIVE
PH UA: 5.5 (ref 5.0–9.0)
RBC UA: 3 /HPF (ref <=4)
SPECIFIC GRAVITY UA: 1.034 — ABNORMAL HIGH (ref 1.003–1.030)
SQUAMOUS EPITHELIAL: 8 /HPF — ABNORMAL HIGH (ref 0–5)
UROBILINOGEN UA: 4 — AB
WBC UA: 18 /HPF — ABNORMAL HIGH (ref 0–5)

## 2023-07-13 LAB — CBC
HEMATOCRIT: 29.6 % — ABNORMAL LOW (ref 34.0–44.0)
HEMOGLOBIN: 10 g/dL — ABNORMAL LOW (ref 11.3–14.9)
MEAN CORPUSCULAR HEMOGLOBIN CONC: 33.9 g/dL (ref 32.0–36.0)
MEAN CORPUSCULAR HEMOGLOBIN: 28.9 pg (ref 25.9–32.4)
MEAN CORPUSCULAR VOLUME: 85.3 fL (ref 77.6–95.7)
MEAN PLATELET VOLUME: 9.8 fL (ref 6.8–10.7)
PLATELET COUNT: 39 10*9/L — ABNORMAL LOW (ref 150–450)
RED BLOOD CELL COUNT: 3.48 10*12/L — ABNORMAL LOW (ref 3.95–5.13)
RED CELL DISTRIBUTION WIDTH: 15.6 % — ABNORMAL HIGH (ref 12.2–15.2)
WBC ADJUSTED: 3.8 10*9/L (ref 3.6–11.2)

## 2023-07-13 LAB — PROTIME-INR
INR: 1.45
PROTIME: 16.5 s — ABNORMAL HIGH (ref 9.9–12.6)

## 2023-07-13 MED ADMIN — lactulose (CEPHULAC) packet 20 g: 20 g | ORAL | @ 04:00:00 | Stop: 2023-07-12

## 2023-07-13 MED ADMIN — magnesium oxide (MAG-OX) tablet 400 mg: 400 mg | ORAL | @ 14:00:00 | Stop: 2023-07-13

## 2023-07-13 MED ADMIN — insulin lispro (HumaLOG) injection 0-20 Units: 0-20 [IU] | SUBCUTANEOUS | @ 17:00:00 | Stop: 2023-07-13

## 2023-07-13 MED ADMIN — lactulose (CEPHULAC) packet 20 g: 20 g | ORAL | @ 18:00:00 | Stop: 2023-07-13

## 2023-07-13 MED ADMIN — furosemide (LASIX) tablet 80 mg: 80 mg | ORAL | @ 14:00:00 | Stop: 2023-07-13

## 2023-07-13 MED ADMIN — enoxaparin (LOVENOX) syringe 40 mg: 40 mg | SUBCUTANEOUS | @ 04:00:00

## 2023-07-13 MED ADMIN — escitalopram oxalate (LEXAPRO) tablet 10 mg: 10 mg | ORAL | @ 14:00:00 | Stop: 2023-07-13

## 2023-07-13 MED ADMIN — rifAXIMin (XIFAXAN) tablet 550 mg: 550 mg | ORAL | @ 14:00:00 | Stop: 2023-07-13

## 2023-07-13 MED ADMIN — pantoprazole (Protonix) EC tablet 20 mg: 20 mg | ORAL | @ 14:00:00 | Stop: 2023-07-13

## 2023-07-13 MED ADMIN — lactulose (CEPHULAC) packet 20 g: 20 g | ORAL | @ 01:00:00 | Stop: 2023-07-12

## 2023-07-13 MED ADMIN — spironolactone (ALDACTONE) tablet 50 mg: 50 mg | ORAL | @ 14:00:00 | Stop: 2023-07-13

## 2023-07-13 MED ADMIN — rifAXIMin (XIFAXAN) tablet 550 mg: 550 mg | ORAL | @ 05:00:00 | Stop: 2023-07-13

## 2023-07-13 MED ADMIN — carvedilol (COREG) tablet 6.25 mg: 6.25 mg | ORAL | @ 05:00:00 | Stop: 2023-07-13

## 2023-07-13 MED ADMIN — lidocaine (ASPERCREME) 4 % 1 patch: 1 | TRANSDERMAL | @ 11:00:00 | Stop: 2023-07-13

## 2023-07-13 MED ADMIN — naltrexone (DEPADE) tablet 50 mg: 50 mg | ORAL | @ 14:00:00 | Stop: 2023-07-13

## 2023-07-13 MED ADMIN — atorvastatin (LIPITOR) tablet 20 mg: 20 mg | ORAL | @ 14:00:00 | Stop: 2023-07-13

## 2023-07-13 MED ADMIN — lactulose (CEPHULAC) packet 20 g: 20 g | ORAL | @ 14:00:00 | Stop: 2023-07-13

## 2023-07-13 NOTE — Unmapped (Signed)
Family Medicine Inpatient Service    Progress Note    Team: Family Medicine [facility] (pgr [phone])    Hospital Day: 1    ASSESSMENT / PLAN:   Denise York is a 58 y.o. female with a past medical history significant for  HTN, T2DM, and NAFLD /cb portal HTN and hepatic encephalopathy who presents with AMS     # Hepatic encephalopathy / NAFLD / Cirrhosis   Confusion, lethargy, AMS x 1 day. Hx of NAFLD and cirrhosis Prescribed Lactulose 30 ml TID at home. Patient reports night of frequent loose stool s/p lactulose and discontinued it for the remained of the week.  On presentation was A/Ox3 with slow, slurred speech accompanied by extreme lethargy. Abdominal distension and hepatomegaly. RUQ and epigastric tenderness. No fluid wave present. Asterixis present. CN II-XI intact. Full strength and sensation. [facility] Grade II.  Ammonia 101, PT 15.1, INR mildly elevated. CT head normal. MRI abd (06/23/23) demonstrates cirrhotic liver morphology and mild splenomegaly 2/2 portal HTN. Patient has work up for liver transplant outpatient, sister is potential donor. Presentation most consistent with hepatic encephalopathy. Low concern for SBP given no significant ascites, fever or other vital instability and normal WBC. Mental status improved this morning though still cognitively slow. Resting tremor present, but no significant asterixis.   -Lactulose 20g-30g BID-QID. Aim for 3-5 soft stools / day.   -Rifaximin BID  -Daily CMP, CBC, PT/INR  -Strict Is/Os  -If patient develops worsening ascites, abdominal pain, or fever, repeat abdominal US, anticipate diagnostic paracentesis     # Portal HTN  Follows with [facility].  -Continue Coreg 6.25 mg BID  -Spironolactone 50 mg   -Lasix 80 mg   -Limit sodium to 2000 mg daily      # Pruritus   -Naltrexone 50 mg daily      # HLD  -Continue Lipitor      # T2DM  -Hold Metformin and Ozempic   -SSI     # Thrombocytopenia   Chronic. Baseline ~55. 57 today (07/11/22).  -Platelets < 50 --hold anticoagulation      # Depression  -Continue Lexapro     # Checklist:  - IVF None  - Tubes/Lines/Drains: PIV  - Diet Low Sodium  - Bowel Regimen:  Lactulose as above  - DVT: Holding anticoagulation due to bleeding risk  - Code Status:   Orders Placed This Encounter   Procedures    Full Code     Standing Status:   Standing     Number of Occurrences:   1     - Dispo: Floor    [ ]  Anticipated Discharge Location: Home  [ ]  PT/OT/DME: PT/OT ordered  [ ]  CM/SW needs: Likely pending PT/OT recs  [ ]  Meds/Rx:  Not yet prescribed. No special med needs  [ ]  Teaching: None anticipated  [ ]  Follow up appt: Appointment needed  [ ]  Excuse letter: None anticipated  [ ]  Transport: Private Needed    SUBJECTIVE:  Interval events:   NAEO    Feels better, less foggy. Had one large bowel movement.       REVIEW OF SYSTEMS:  Pertinent positives and negatives per HPI. A complete review of systems otherwise negative.    PHYSICAL EXAM:      Intake/Output Summary (Last 24 hours) at 07/13/2023 0844  Last data filed at 07/13/2023 0156  Gross per 24 hour   Intake 120 ml   Output 230 ml   Net -110 ml  Recent Vitals:  Vitals:    07/13/23 0400   BP: 111/47   Pulse: 87   Resp: 12   Temp: 37 ??C (98.6 ??F)   SpO2: 100%       GEN: well appearing, lying in bed, NAD   HEENT: NCAT, No scleral icterus. Conjunctiva non-erythematous. MMM.  CV: Regular rate and rhythm. No murmurs/rubs/gallops.  Pulm: Normal work of breathing on RA. CTAB. No wheezing, crackles, or rhonchi.  Abd: Flat.  Tender to palpation in RUQ and epigastric region.. No guarding, rebound.  Normoactive bowel sounds.    Neuro: A&O x 2 (self and place, but was able to pick correct year with choices). Resting tremor. No focal deficits.  Ext: No peripheral edema.  Palpable distal pulses.  Skin: No rashes or skin lesions.       LABS/ STUDIES:    All imaging, laboratory studies, and other pertinent tests including electrocardiography within the last 24 hours were reviewed and are summarized within the assessment and plan.     NUTRITION:                           Katherine Roan, MD,  PGY2  July 13, 2023 8:44 AM

## 2023-07-13 NOTE — Unmapped (Signed)
Problem: Adult Inpatient Plan of Care  Goal: Plan of Care Review  Outcome: Progressing  Goal: Patient-Specific Goal (Individualized)  Outcome: Progressing  Goal: Absence of Hospital-Acquired Illness or Injury  Outcome: Progressing  Intervention: Identify and Manage Fall Risk  Recent Flowsheet Documentation  Taken 07/13/2023 1000 by Johnn Hai, RN  Safety Interventions:   low bed   fall reduction program maintained   commode/urinal/bedpan at bedside  Taken 07/13/2023 0800 by Johnn Hai, RN  Safety Interventions:   low bed   fall reduction program maintained   room near unit station   nonskid shoes/slippers when out of bed   commode/urinal/bedpan at bedside  Intervention: Prevent Skin Injury  Recent Flowsheet Documentation  Taken 07/13/2023 1000 by Johnn Hai, RN  Positioning for Skin: Supine/Back  Taken 07/13/2023 0800 by Johnn Hai, RN  Positioning for Skin: Supine/Back  Skin Protection:   adhesive use limited   incontinence pads utilized  Goal: Optimal Comfort and Wellbeing  Outcome: Progressing  Goal: Readiness for Transition of Care  Outcome: Progressing  Goal: Rounds/Family Conference  Outcome: Progressing     Problem: Fall Injury Risk  Goal: Absence of Fall and Fall-Related Injury  Outcome: Progressing  Intervention: Promote Scientist, clinical (histocompatibility and immunogenetics) Documentation  Taken 07/13/2023 1000 by Johnn Hai, RN  Safety Interventions:   low bed   fall reduction program maintained   commode/urinal/bedpan at bedside  Taken 07/13/2023 0800 by Johnn Hai, RN  Safety Interventions:   low bed   fall reduction program maintained   room near unit station   nonskid shoes/slippers when out of bed   commode/urinal/bedpan at bedside     Problem: Skin Injury Risk Increased  Goal: Skin Health and Integrity  Outcome: Progressing  Intervention: Optimize Skin Protection  Recent Flowsheet Documentation  Taken 07/13/2023 1000 by Johnn Hai, RN  Activity Management: up to bedside commode  Pressure Reduction Techniques: weight shift assistance provided  Pressure Reduction Devices:   pressure-redistributing mattress utilized   positioning supports utilized  Taken 07/13/2023 0800 by Johnn Hai, RN  Activity Management: up to bedside commode  Pressure Reduction Techniques:   weight shift assistance provided   pressure points protected   heels elevated off bed  Head of Bed (HOB) Positioning: HOB at 20-30 degrees  Pressure Reduction Devices:   pressure-redistributing mattress utilized   positioning supports utilized   heel offloading device utilized  Skin Protection:   adhesive use limited   incontinence pads utilized     Problem: Self-Care Deficit  Goal: Improved Ability to Complete Activities of Daily Living  Outcome: Progressing

## 2023-07-13 NOTE — Unmapped (Addendum)
Denise York is a 58 y.o. 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 On initial presentation she was alert and oriented X 3 but demonstrated slow, slurred speech and extreme lethargy. Exam notable for asterixis. Ammonia level 101. CT head showed no acute intracranial process. There was low concern for SBP given lack of fever or vital sign instability, no significant abdominal distension or fluid wave on exam, and normal WBC count. Patient was restarted on lactulose to good effect. At time of discharge, patients mental status had improved significantly and there was no longer asterixis on exam. Per family at bedside, patient was acting like her usual self. Patient was assessed to be stable for discharge with close follow-up outpatient. She has an extensive outpatient transplant hepatology work-up scheduled for tomorrow in preparation for possible liver transplant from her sister and also has PCP follow-up scheduled on 1/7.     Medications for chronic conditions were continued during admission other than metformin and Ozempic, which she will resume on discharge.

## 2023-07-13 NOTE — Unmapped (Signed)
[facility]  Emergency Department Provider Note      ED Clinical Impression     Final diagnoses:   Hepatic encephalopathy (CMS-HCC) (Primary)       Initial Impression/MDM, Assessment and Plan, ED Course,     Time seen: July 12, 2023 6:15 PM    58 y.o. female with a past medical history of CHF, T2DM, and NAFLD c/b portal HTN and hepatic encephalopathy presenting for evaluation of altered mental status and sleepiness today in the setting of frequent occurrence of the same since 06/2023.     On exam, the patient is in no acute distress. VS are WNL. Drowsy, slurring speech. Oriented to self, however, cannot say time or place. Does follow simple commands. Appears somewhat confused. Lungs clear to auscultation bilaterally. Heart rate and rhythm regular. Abdomen soft and nontender. No focal deficits.    AMS decreased responsiveness in patient with history of liver failure. No focal deficits to suggest CVA. Differential does include hepatic encephalopathy, infectious etiology, metabolic etiology. Potential intracranial bleed but seems unlikely. Plan for labs, CT head. Will likely need admission. Although she is taking lactulose, she may need more at this point.     Discussed with medicine who admit the patient.               ____________________________________________        History     Chief Complaint  Altered Mental Status      HPI   Denise York is a 58 y.o. female who presents to the [facility] Emergency Department for evaluation of altered mental status. Per the patient's daughter in law at bedside, her mental changes began around 2 PM today, though this has occurred frequently since 06/27/23. This is in the setting of the patient's NAFLD diagnosis around 1 year ago complicated by hepatic encephalopathy. Eventually, the goal is for her to receive a transplant. The patient reports limited PO intake today. No EtOH use yesterday. No nausea, emesis, diarrhea, or fevers.      Physical Exam     VITAL SIGNS:    ED Triage Vitals [07/12/23 1519]   Enc Vitals Group      BP 135/79      Heart Rate 82      SpO2 Pulse       Resp 16      Temp 36.6 ??C (97.8 ??F)      Temp Source Oral      SpO2 100 %     Constitutional: Drowsy, slurring speech. Oriented to self, however, cannot say time or place. Does follow simple commands. Appears somewhat confused.   Eyes: Conjunctivae are normal.   ENT       Head: Normocephalic and atraumatic.       Nose: No congestion.       Mouth/Throat: Mucous membranes are moist.       Neck: No stridor.  Cardiovascular: Normal rate, regular rhythm.  Respiratory: Normal respiratory effort.  Gastrointestinal: Soft, non tender, non distend, without rebound or guarding.  Musculoskeletal: No deformities.  Neurologic: Normal speech and language. No gross focal neurologic deficits are appreciated.  Skin: Skin is warm, dry and intact. No rash noted.      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    Obesity     Prediabetes        Past Surgical History  Past Surgical History:   Procedure Laterality Date    ABDOMINAL WALL MESH  REMOVAL  1997, 2005    Placement, 1997, removal and replacement 2005    BLADDER SURGERY      bladder tact X2    BREAST BIOPSY Right 04/2013    benign    BREAST EXCISIONAL BIOPSY Right 05/2013    benign    BREAST SURGERY Right     precancerous spot removed    CHOLECYSTECTOMY      HYSTERECTOMY      IR EMBOLIZATION VENOUS OTHER THAN HEMORRHAGE  02/17/2023    IR EMBOLIZATION VENOUS OTHER THAN HEMORRHAGE 02/17/2023 Ammie Dalton, MD IMG VIR [facility]    PR COLSC FLX W/RMVL OF TUMOR POLYP LESION SNARE TQ N/A 10/14/2016    Procedure: COLONOSCOPY FLEX; W/REMOV TUMOR/LES BY SNARE;  Surgeon: Alfred Levins, MD;  Location: [facility] GI PROCEDURES [place];  Service: Gastroenterology    PR COLSC FLX W/RMVL OF TUMOR POLYP LESION SNARE TQ N/A 01/15/2023    Procedure: COLONOSCOPY FLEX; W/REMOV TUMOR/LES BY SNARE;  Surgeon: Mickie Hillier, MD;  [facility]: [facility];  Service: Gastroenterology    PR PERC CLOS,CONG INTERATRIAL COMMUN W/IMPL N/A 09/12/2022    Procedure: ASD closure;  Surgeon: Jacquelyne Balint, MD;  Location: [facility] CATH;  Service: Cardiology    PR UPPER GI ENDOSCOPY,BIOPSY N/A 01/15/2023    Procedure: UGI ENDOSCOPY; WITH BIOPSY, SINGLE OR MULTIPLE;  Surgeon: Mickie Hillier, MD;  Location: [facility];  Service: Gastroenterology    SKIN BIOPSY         Medications    Current Facility-Administered Medications:     lactulose (CEPHULAC) packet 20 g, 20 g, Oral, Once, Shenvi, Nicola Girt, MD    Current Outpatient Medications:     atorvastatin (LIPITOR) 20 MG tablet, Take 1 tablet (20 mg total) by mouth daily., Disp: 90 tablet, Rfl: 3    blood sugar diagnostic (GLUCOSE BLOOD) Strp, Use to test blood sugar daily, Disp: 100 strip, Rfl: 3    blood-glucose meter kit, Use to test blood sugar DAILY, Disp: 1 each, Rfl: 1    carvedilol (COREG) 6.25 MG tablet, Take 1 tablet (6.25 mg total) by mouth two (2) times a day., Disp: 60 tablet, Rfl: 11    carvedilol (COREG) 6.25 MG tablet, Take 1 tablet (6.25 mg total) by mouth two (2) times a day. Please add to pill pack, Disp: 60 tablet, Rfl: 11    cholecalciferol, vitamin D3, 2,000 unit cap, Take 2 capsules (4,000 Units total) by mouth daily., Disp: 1 each, Rfl: 0    escitalopram oxalate (LEXAPRO) 10 MG tablet, Take 1 tablet (10 mg total) by mouth daily., Disp: 90 tablet, Rfl: 3    furosemide (LASIX) 80 MG tablet, Take 1 tablet (80 mg total) by mouth daily. Please add to pill packs, Disp: 90 tablet, Rfl: 0    inhalational spacing device (AEROCHAMBER MV) Spcr, Use as directed with inhalers, Disp: 1 each, Rfl: 0    lancets Misc, Use to test blood sugar daily, Disp: 100 each, Rfl: 3    loratadine (CLARITIN) 10 mg tablet, Take 1 tablet (10 mg total) by mouth daily., Disp: 90 tablet, Rfl:  1    magnesium oxide (MAG-OX) 400 mg (241.3 mg elemental magnesium) tablet, Take 1 tablet (400 mg total) by mouth daily., Disp: 90 tablet, Rfl: 3    metFORMIN (GLUCOPHAGE-XR) 500 MG 24 hr tablet, Take 1 tablet (500 mg total) by mouth daily with evening meal., Disp: 90 tablet, Rfl: 3    multivitamin (TAB-A-VITE/THERAGRAN) per tablet, Take 1 tablet by mouth daily., Disp: 90 tablet, Rfl: 3    naltrexone (DEPADE) 50 mg tablet, Take 1 tablet (50 mg total) by mouth daily., Disp: 30 tablet, Rfl: 11    naltrexone (DEPADE) 50 mg tablet, Take 1 tablet (50 mg total) by mouth daily. Please add to pill packs, Disp: 30 tablet, Rfl: 11    omeprazole (PRILOSEC) 20 MG capsule, Take 1 capsule (20 mg total) by mouth Two (2) times a day (30 minutes before a meal)., Disp: 180 capsule, Rfl: 3    psyllium seed, with sugar, (FIBER ORAL), Take 4 capsules by mouth daily. BJs brand., Disp: , Rfl:     rifAXIMin (XIFAXAN) 550 mg Tab, Take 1 tablet (550 mg total) by mouth two (2) times a day., Disp: 180 tablet, Rfl: 3    semaglutide (OZEMPIC) 2 mg/dose (8 mg/3 mL) PnIj, Inject 2 mg under the skin every seven (7) days., Disp: 9 mL, Rfl: 3    spironolactone (ALDACTONE) 50 MG tablet, Take 1 tablet (50 mg total) by mouth daily., Disp: 90 tablet, Rfl: 3    Allergies  Penicillins, Shellfish containing products, Glipizide, and Lisinopril    Family History  Family History   Problem Relation Age of Onset    Heart disease Mother     Hypertension Mother     COPD Mother     Heart disease Father     Kidney disease Father     Allergies Father     Gout Father     Diabetes Father     Allergies Other         FAMILY H/O    Coronary artery disease Other         FAMILY H/O    Kidney failure Other         FAMILY H/O    No Known Problems Sister     No Known Problems Daughter     No Known Problems Maternal Grandmother No Known Problems Maternal Grandfather     No Known Problems Paternal Grandmother     No Known Problems Paternal Grandfather     Alcohol abuse Maternal Uncle     Birth defects Son     Birth defects Son     BRCA 1/2 Neg Hx     Breast cancer Neg Hx     Cancer Neg Hx     Colon cancer Neg Hx     Endometrial cancer Neg Hx     Ovarian cancer Neg Hx     Substance Abuse Disorder Neg Hx     Mental illness Neg Hx        Social History  Social History     Tobacco Use    Smoking status: Never     Passive exposure: Past    Smokeless tobacco: Never   Vaping Use    Vaping status: Never Used   Substance Use Topics    Alcohol use: No    Drug use: No     Social Drivers of Health with Concerns     Internet Connectivity: Not on file   Housing/Utilities: Low Risk  (06/27/2023)  Housing/Utilities     Within the past 12 months, have you ever stayed: outside, in a car, in a tent, in an overnight shelter, or temporarily in someone else's home (i.e. couch-surfing)?: No     Are you worried about losing your housing?: No     Within the past 12 months, have you been unable to get utilities (heat, electricity) when it was really needed?: No   Recent Concern: Housing/Utilities - High Risk (06/12/2023)    Housing/Utilities     Within the past 12 months, have you ever stayed: outside, in a car, in a tent, in an overnight shelter, or temporarily in someone else's home (i.e. couch-surfing)?: No     Are you worried about losing your housing?: No     Within the past 12 months, have you been unable to get utilities (heat, electricity) when it was really needed?: Yes   Alcohol Use: Not on file   Physical Activity: Not on file   Intimate Partner Violence: Not on file   Stress: Not on file   Substance Use: Not on file (05/18/2023)   Social Connections: Not on file   Financial Resource Strain: Not on file   Depression: Not on file   Health Literacy: Not on file       Review of Systems:   Pertinent positives and negatives are documented as per the HPI        EKG   See ED Course above     Radiology     CT Head Wo Contrast    (Results Pending)       Labs     Labs Reviewed   BASIC METABOLIC PANEL - Abnormal; Notable for the following components:       Result Value    BUN 24 (*)     All other components within normal limits   HEPATIC FUNCTION PANEL - Abnormal; Notable for the following components:    Albumin 3.1 (*)     Total Bilirubin 1.5 (*)     Bilirubin, Direct 0.60 (*)     AST 60 (*)     All other components within normal limits   PROTIME-INR - Abnormal; Notable for the following components:    PT 15.1 (*)     All other components within normal limits   BLOOD GAS CRITICAL CARE PANEL, VENOUS - Abnormal; Notable for the following components:    pO2, Ven 28 (*)     HCO3, Ven 28 (*)     Base Excess, Ven 6.2 (*)     Lactate, Venous 2.0 (*)     All other components within normal limits   AMMONIA - Abnormal; Notable for the following components:    Ammonia 101 (*)     All other components within normal limits   CBC W/ AUTO DIFF - Abnormal; Notable for the following components:    HCT 33.7 (*)     RDW 15.3 (*)     Platelet 57 (*)     All other components within normal limits   INFLUENZA/RSV/COVID PCR - Normal    Narrative:     This test was performed using the Cepheid Xpert Xpress SARS-CoV-2/Flu/RSV plus assay, which has been validated by the CLIA-certified, CAP-inspected [facility]. FDA has granted Emergency Use Authorization for this test. Negative results do not preclude infection and should be interpreted along with clinical observations, patient history, and epidemiological information. Information for providers and patients can be found here: [email or web address]   HIGH  SENSITIVITY TROPONIN I - SINGLE - Normal   ETHANOL - Normal    Narrative:     Testing for medical purposes only.   CBC W/ DIFFERENTIAL    Narrative:     The following orders were created for panel order CBC w/ Differential.                  Procedure                               Abnormality         Status                                     ---------                               -----------         ------                                     CBC w/ Differential[[id number]]         Abnormal            Final result                                                 Please view results for these tests on the individual orders.   URINALYSIS WITH MICROSCOPY WITH CULTURE REFLEX    Narrative:     The following orders were created for panel order Urinalysis with Microscopy with Culture Reflex.                  Procedure                               Abnormality         Status                                     ---------                               -----------         ------                                     Urinalysis with Microsc.Marland KitchenMarland Kitchen[[id number]]                                                                                   Please view results for these tests on the individual orders.  TOXICOLOGY SCREEN, URINE   URINALYSIS WITH MICROSCOPY WITH CULTURE REFLEX PERFORMABLE       Pertinent labs & imaging results that were available during my care of the patient were reviewed by me and considered in my medical decision making (see chart for details).      Documentation assistance was provided by Sharol Given, Scribe, on July 12, 2023 at 6:16 PM for Arliss Journey, DO    A scribe was used when documenting this visit. I agree with the above documentation. Signed by  Marlan Palau, DO on  July 14, 2023 at 7:36 PM       Jones Skene, DO  07/14/23 [phone]

## 2023-07-13 NOTE — Unmapped (Signed)
Pt admitted to CCU as SD status. VSS. Oriented x4, delayed responses. RA, SpO2 >92%. Pt ambulating with minimal assistance. Lactulose given x2. No BM this shift. Voiding multiple times. Ambulating to bathroom. Bed alarm active, call bell within reach.    Problem: Adult Inpatient Plan of Care  Goal: Plan of Care Review  Outcome: Ongoing - Unchanged  Goal: Patient-Specific Goal (Individualized)  Outcome: Ongoing - Unchanged  Goal: Absence of Hospital-Acquired Illness or Injury  Outcome: Ongoing - Unchanged  Intervention: Identify and Manage Fall Risk  Recent Flowsheet Documentation  Taken 07/12/2023 2158 by Pricilla Loveless D, RN  Safety Interventions:   bed alarm   fall reduction program maintained   lighting adjusted for tasks/safety   low bed   nonskid shoes/slippers when out of bed   aspiration precautions   commode/urinal/bedpan at bedside   family at bedside  Intervention: Prevent Skin Injury  Recent Flowsheet Documentation  Taken 07/13/2023 0200 by Pricilla Loveless D, RN  Positioning for Skin: Supine/Back  Taken 07/13/2023 0000 by Pricilla Loveless D, RN  Positioning for Skin: Right  Taken 07/12/2023 2158 by Pricilla Loveless D, RN  Positioning for Skin: Supine/Back  Device Skin Pressure Protection: absorbent pad utilized/changed  Skin Protection:   cleansing with dimethicone incontinence wipes   incontinence pads utilized  Goal: Optimal Comfort and Wellbeing  Outcome: Ongoing - Unchanged  Goal: Readiness for Transition of Care  Outcome: Ongoing - Unchanged  Goal: Rounds/Family Conference  Outcome: Ongoing - Unchanged     Problem: Fall Injury Risk  Goal: Absence of Fall and Fall-Related Injury  Outcome: Ongoing - Unchanged  Intervention: Promote Injury-Free Environment  Recent Flowsheet Documentation  Taken 07/12/2023 2158 by Pricilla Loveless D, RN  Safety Interventions:   bed alarm   fall reduction program maintained   lighting adjusted for tasks/safety   low bed   nonskid shoes/slippers when out of bed   aspiration precautions commode/urinal/bedpan at bedside   family at bedside     Problem: Skin Injury Risk Increased  Goal: Skin Health and Integrity  Outcome: Ongoing - Unchanged  Intervention: Optimize Skin Protection  Recent Flowsheet Documentation  Taken 07/13/2023 0200 by Pricilla Loveless D, RN  Activity Management: ambulated to bathroom  Head of Bed [facility]) Positioning: HOB at 30 degrees  Taken 07/13/2023 0000 by Pricilla Loveless D, RN  Activity Management: ambulated to bathroom  Head of Bed [facility]) Positioning: HOB at 30 degrees  Taken 07/12/2023 2158 by Pricilla Loveless D, RN  Activity Management: bedrest  Pressure Reduction Techniques:   frequent weight shift encouraged   heels elevated off bed  Head of Bed (HOB) Positioning: HOB at 30 degrees  Pressure Reduction Devices: pressure-redistributing mattress utilized  Skin Protection:   cleansing with dimethicone incontinence wipes   incontinence pads utilized     Problem: Self-Care Deficit  Goal: Improved Ability to Complete Activities of Daily Living  Outcome: Ongoing - Unchanged

## 2023-07-13 NOTE — Unmapped (Signed)
Family Medicine Inpatient Service  History and Physical Note    Team: Orion Modest ([id number])  PCP: Marca Ancona, MD  Date of Admission: July 12, 2023  Code Status: full code  Emergency Contact: Extended Emergency Contact Information  Primary Emergency Contact: Smith,Kelly  Mobile Phone: [phone]  Relation: Daughter  Secondary Emergency Contact: Springfield,Walter   [place]  Mobile Phone: [phone]  Relation: Spouse    ASSESSMENT / PLAN:   Denise York is a 58 y.o. female with a past medical history significant for HTN, T2DM, and NAFLD /cb portal HTN and hepatic encephalopathy who presents with AMS    # Hepatic encephalopathy / NAFLD / Cirrhosis   Confusion, lethargy, AMS x 1 day. Hx of NAFLD and cirrhosis Takes Lactulose 30 ml TID at home, with minimal compliance. Patient reports night of frequent loose stool s/p lactulose and discontinued it for the remained of the week. A/Ox3, responsive albeit delayed. Slow, slurred speech accompanied by extreme lethargy. Abdominal distension and hepatomegaly. RUQ and epigastric tenderness, however no overt abdominal tenderness. No fluid wave present. Asterixis present. CN II-XI intact. Full strength and sensation. [facility] Grade II. Platelets 57, Tbili 1.5, albumin 3.1, AST 60, ammonia 101, PT 15.1, normal INR. CT head normal. MRI abd (06/23/23) demonstrates cirrhotic liver morphology and mild splenomegaly 2/2 portal HTN. Patient has work up for liver transplant outpatient, sister is potential donor. At this time, no appreciable fluid wave, no overt abdominal tenderness, no concern for SBP.   -Lactulose 20g-30g BID-QID. Aim for 3-5 soft stools / day. If no BM consider Lactulose enema.   -Rifaximin BID  -Daily CMP, CBC, PT/INR  -Strict Is/Os  -If patient develops worsening ascites, abdominal pain, or fever, repeat abdominal US, anticipate diagnostic paracentesis    # Portal HTN  Follows with [facility].  -Continue Coreg 6.25 mg BID  -Spironolactone 50 mg   -Lasix 80 mg   -Limit sodium to 2000 mg daily     # Pruritus   -Naltrexone 50 mg daily     # HLD  -Continue Lipitor     # T2DM  -Hold Metformin and Ozempic   -SSI    # Thrombocytopenia   Chronic. Baseline ~55. 57 today (07/11/22).  -Platelets < 50 hold anticoagulation     # Depression  -Continue Lexapro     # FEN/GI:  - IVF None  - Check electrolytes as indicated, replete as needed.  - Diet Regular    # PPX:   - DVT: SQ Lovenox    # Dispo: Step down  [ ]  Anticipated Discharge Location: Home  [ ]  PT/OT/DME: PT/OT ordered  [ ]  CM/SW needs: None anticipated  [ ]  Teaching: None anticipated    HISTORY OF PRESENT ILLNESS:  Denise York is a 58 y.o. female who presents with AMS x 1 day. Patient has NAFLD and cirrhosis at baseline, with previous hospitalzations for hepatic encephalopathy. Reportedly takes 30 ml TID of lactulose at home. Sister at bedside states she is not compliant with it. Patient had a night of copious stool this past weekend, and reportedly held off from taking it througout the week, leading to her presentation.     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    Obesity     Prediabetes      Past Surgical History:   Procedure Laterality Date    ABDOMINAL WALL MESH  REMOVAL  1997, 2005    Placement, 1997, removal and replacement 2005    BLADDER SURGERY      bladder tact X2    BREAST BIOPSY Right 04/2013    benign    BREAST EXCISIONAL BIOPSY Right 05/2013    benign    BREAST SURGERY Right     precancerous spot removed    CHOLECYSTECTOMY      HYSTERECTOMY      IR EMBOLIZATION VENOUS OTHER THAN HEMORRHAGE  02/17/2023    IR EMBOLIZATION VENOUS OTHER THAN HEMORRHAGE 02/17/2023 Ammie Dalton, MD IMG VIR [facility]    PR COLSC FLX W/RMVL OF TUMOR POLYP LESION SNARE TQ N/A 10/14/2016    Procedure: COLONOSCOPY FLEX; W/REMOV TUMOR/LES BY SNARE;  Surgeon: Alfred Levins, MD;  Location: [facility] GI PROCEDURES [place];  Service: Gastroenterology    PR COLSC FLX W/RMVL OF TUMOR POLYP LESION SNARE TQ N/A 01/15/2023    Procedure: COLONOSCOPY FLEX; W/REMOV TUMOR/LES BY SNARE;  Surgeon: Mickie Hillier, MD;  Location: [facility];  Service: Gastroenterology    PR PERC CLOS,CONG INTERATRIAL COMMUN W/IMPL N/A 09/12/2022    Procedure: ASD closure;  Surgeon: Jacquelyne Balint, MD;  Location: [facility] CATH;  Service: Cardiology    PR UPPER GI ENDOSCOPY,BIOPSY N/A 01/15/2023    Procedure: UGI ENDOSCOPY; WITH BIOPSY, SINGLE OR MULTIPLE;  Surgeon: Mickie Hillier, MD;  Location: [facility];  Service: Gastroenterology    SKIN BIOPSY         FAMILY HX:   Family History   Problem Relation Age of Onset    Heart disease Mother     Hypertension Mother     COPD Mother     Heart disease Father     Kidney disease Father     Allergies Father     Gout Father     Diabetes Father     Allergies Other         FAMILY H/O    Coronary artery disease Other         FAMILY H/O    Kidney failure Other         FAMILY H/O    No Known Problems Sister     No Known Problems Daughter     No Known Problems Maternal Grandmother     No Known Problems Maternal Grandfather     No Known Problems Paternal Grandmother     No Known Problems Paternal Grandfather     Alcohol abuse Maternal Uncle     Birth defects Son     Birth defects Son     BRCA 1/2 Neg Hx     Breast cancer Neg Hx     Cancer Neg Hx     Colon cancer Neg Hx     Endometrial cancer Neg Hx     Ovarian cancer Neg Hx     Substance Abuse Disorder Neg Hx     Mental illness Neg Hx        SOCIAL HX:   Social History     Socioeconomic History  Marital status: Married     Spouse name: None    Number of children: None    Years of education: None    Highest education level: None   Tobacco Use    Smoking status: Never     Passive exposure: Past    Smokeless tobacco: Never   Vaping Use    Vaping status: Never Used   Substance and Sexual Activity    Alcohol use: No    Drug use: No    Sexual activity: Not Currently     Birth control/protection: Abstinence, None   Other Topics Concern    Do you use sunscreen? Yes    Excessive sun exposure? Yes   Social History Narrative    2 births. No living children. Married new husband 2014.         02/21/2017        [facility] Components:        Family, social, cultural characteristics: social support includes best friend .      Patient has the following communication needs: none, per patient     Health Literacy: How confident are you that you understand your health issues/concerns, can participate in your care, and manage your care along with your physician: confident.    Behaviors Affecting Health: none, per patient    Family history of mental health illness and/or substance abuse: asked patient/parent and none disclosed.    Have you been seen by any medical provider that we have not referred you to since your last visit ? No    Discussed a Living Will with the patient and ATF:TDDUKGU is under the age of 26.      Social Drivers of Health     Food Insecurity: No Food Insecurity (06/27/2023)    Hunger Vital Sign     Worried About Running Out of Food in the Last Year: Never true     Ran Out of Food in the Last Year: Never true   Transportation Needs: No Transportation Needs (06/27/2023)    PRAPARE - Therapist, art (Medical): No     Lack of Transportation (Non-Medical): No       MEDICATIONS / ALLERGIES:  (Not in a hospital admission)      Allergies   Allergen Reactions    Penicillins Hives    Shellfish Containing Products Anaphylaxis    Glipizide Headache    Lisinopril Cough       IMMUNIZATIONS:  Immunization History   Administered Date(s) Administered    COVID-19 VAC,MRNA,TRIS(12Y UP)(PFIZER)(GRAY CAP) 08/19/2020    COVID-19 VACC,MRNA,(PFIZER)(PF) 10/02/2019, 10/23/2019    Covid-19 Vac, (32yr+) (Comirnaty) [employer]  04/08/2022    HEPATITIS B VACCINE ADULT, ADJUVANTED, IM(HEPLISAV B) 06/17/2022, 09/26/2022    Hepatitis A (Adult) 06/17/2022, 09/26/2022    INFLUENZA INJ MDCK PF, QUAD,(FLUCELVAX)(47MO AND UP EGG FREE) 05/15/2021    INFLUENZA TIV (TRI) PF (IM)(HISTORICAL) 04/09/2011    INFLUENZA VACCINE IIV3(IM)(PF)6 MOS UP 06/12/2023    Influenza TRI (IIV3) 5+yrs MDV 06/04/2013    Influenza Vaccine Quad(IM)6 MO-Adult(PF) 05/08/2017, 04/09/2018, 03/11/2019    Influenza Virus Vaccine, unspecified formulation 04/08/2015, 04/07/2016, 05/04/2020, 04/08/2022    PNEUMOCOCCAL POLYSACCHARIDE 23-VALENT 07/19/2014    Pneumococcal Conjugate 20-valent 06/17/2022    SHINGRIX-ZOSTER VACCINE (HZV),RECOMBINANT,ADJUVANTED(IM) 11/09/2020, 05/15/2021    TdaP 07/16/2011, 06/17/2022       REVIEW OF SYSTEMS:  Pertinent positives and negatives per HPI. A complete review of systems otherwise negative.    PHYSICAL EXAM:    Initial ED  Vitals:   ED Triage Vitals   Enc Vitals Group      BP 07/12/23 1519 135/79      Heart Rate 07/12/23 1519 82      SpO2 Pulse 07/12/23 1931 80      Resp 07/12/23 1519 16      Temp 07/12/23 1519 36.6 ??C (97.8 ??F)      Temp Source 07/12/23 1519 Oral      SpO2 07/12/23 1519 100 %      Weight --       Height --       Head Circumference --       Peak Flow --       Pain Score --       Pain Loc --       Pain Education --       Exclude from Growth Chart --        Recent Vitals:  Vitals:    07/12/23 1931   BP: 129/52   Pulse:    Resp:    Temp:    SpO2: 98%       GEN: Well-appearing, lying in bed, NAD Lethargic, slow and slurred speech, but responsive.  Eyes: PERRL. No scleral icterus. Conjunctiva non-erythematous. EOMI.  HEENT: NCAT, MMM. Oropharynx clear.  Neck: Supple.  Lymphadenopathy: No cervical or supraclavicular LAD.  CV: Regular rate and rhythm. No murmurs/rubs/gallops. No costochondral tenderness. No cyanosis or clubbing. Cap Refill < 2 secs   Pulm: CTAB. No wheezing, crackles, or rhonchi.  Abd: Distended.  Epigastric and RUQ tenderness. No guarding, rebound.  Normoactive bowel sounds.  Neuro: A&O x 3. No focal deficits. Strength 5/5 UE/LE. Distal sensation to light touch intact. CN II-XI intact. Asterixis.   Ext: No peripheral edema.  Palpable distal pulses.  Skin: No rashes or skin lesions.        LABS/ STUDIES:  All imaging, laboratory studies, and other pertinent tests including electrocardiography were reviewed prior to admission and are summarized within the assessment and plan.     Clovis Cao II, DO  [facility], PGY-1

## 2023-07-14 ENCOUNTER — Other Ambulatory Visit: Admit: 2023-07-14 | Discharge: 2023-07-14 | Payer: PRIVATE HEALTH INSURANCE

## 2023-07-14 ENCOUNTER — Encounter: Admit: 2023-07-14 | Discharge: 2023-07-14 | Payer: PRIVATE HEALTH INSURANCE

## 2023-07-14 ENCOUNTER — Ambulatory Visit: Admit: 2023-07-14 | Discharge: 2023-07-14 | Payer: PRIVATE HEALTH INSURANCE

## 2023-07-14 ENCOUNTER — Ambulatory Visit
Admit: 2023-07-14 | Discharge: 2023-07-14 | Payer: PRIVATE HEALTH INSURANCE | Attending: Student in an Organized Health Care Education/Training Program | Primary: Student in an Organized Health Care Education/Training Program

## 2023-07-14 DIAGNOSIS — K7210 Chronic hepatic failure without coma: Principal | ICD-10-CM

## 2023-07-14 DIAGNOSIS — I8510 Secondary esophageal varices without bleeding: Principal | ICD-10-CM

## 2023-07-14 DIAGNOSIS — L299 Pruritus, unspecified: Principal | ICD-10-CM

## 2023-07-14 DIAGNOSIS — K7682 Hepatic encephalopathy (CMS-HCC): Principal | ICD-10-CM

## 2023-07-14 DIAGNOSIS — R188 Other ascites: Principal | ICD-10-CM

## 2023-07-14 DIAGNOSIS — K766 Portal hypertension: Principal | ICD-10-CM

## 2023-07-14 DIAGNOSIS — Z7682 Awaiting organ transplant status: Principal | ICD-10-CM

## 2023-07-14 DIAGNOSIS — K7469 Other cirrhosis of liver: Principal | ICD-10-CM

## 2023-07-14 LAB — TOXICOLOGY SCREEN, URINE
AMPHETAMINE SCREEN URINE: NEGATIVE
BARBITURATE SCREEN URINE: NEGATIVE
BENZODIAZEPINE SCREEN, URINE: NEGATIVE
BUPRENORPHINE, URINE SCREEN: NEGATIVE
CANNABINOID SCREEN URINE: NEGATIVE
COCAINE(METAB.)SCREEN, URINE: NEGATIVE
FENTANYL SCREEN, URINE: NEGATIVE
METHADONE SCREEN, URINE: NEGATIVE
OPIATE SCREEN URINE: NEGATIVE
OXYCODONE SCREEN URINE: NEGATIVE

## 2023-07-14 LAB — CBC W/ AUTO DIFF
BASOPHILS ABSOLUTE COUNT: 0 10*9/L (ref 0.0–0.1)
BASOPHILS RELATIVE PERCENT: 0.8 %
EOSINOPHILS ABSOLUTE COUNT: 0.1 10*9/L (ref 0.0–0.5)
EOSINOPHILS RELATIVE PERCENT: 2.5 %
HEMATOCRIT: 33 % — ABNORMAL LOW (ref 34.0–44.0)
HEMOGLOBIN: 11.2 g/dL — ABNORMAL LOW (ref 11.3–14.9)
LYMPHOCYTES ABSOLUTE COUNT: 1.1 10*9/L (ref 1.1–3.6)
LYMPHOCYTES RELATIVE PERCENT: 20.6 %
MEAN CORPUSCULAR HEMOGLOBIN CONC: 34 g/dL (ref 32.0–36.0)
MEAN CORPUSCULAR HEMOGLOBIN: 28.9 pg (ref 25.9–32.4)
MEAN CORPUSCULAR VOLUME: 85 fL (ref 77.6–95.7)
MEAN PLATELET VOLUME: 10.4 fL (ref 6.8–10.7)
MONOCYTES ABSOLUTE COUNT: 0.6 10*9/L (ref 0.3–0.8)
MONOCYTES RELATIVE PERCENT: 10.3 %
NEUTROPHILS ABSOLUTE COUNT: 3.6 10*9/L (ref 1.8–7.8)
NEUTROPHILS RELATIVE PERCENT: 65.8 %
PLATELET COUNT: 57 10*9/L — ABNORMAL LOW (ref 150–450)
RED BLOOD CELL COUNT: 3.88 10*12/L — ABNORMAL LOW (ref 3.95–5.13)
RED CELL DISTRIBUTION WIDTH: 15.2 % (ref 12.2–15.2)
WBC ADJUSTED: 5.5 10*9/L (ref 3.6–11.2)

## 2023-07-14 LAB — COMPREHENSIVE METABOLIC PANEL
ALBUMIN: 3.1 g/dL — ABNORMAL LOW (ref 3.4–5.0)
ALKALINE PHOSPHATASE: 102 U/L (ref 46–116)
ALT (SGPT): 48 U/L (ref 10–49)
ANION GAP: 11 mmol/L (ref 5–14)
AST (SGOT): 62 U/L — ABNORMAL HIGH (ref <=34)
BILIRUBIN TOTAL: 2.1 mg/dL — ABNORMAL HIGH (ref 0.3–1.2)
BLOOD UREA NITROGEN: 19 mg/dL (ref 9–23)
BUN / CREAT RATIO: 20
CALCIUM: 9.4 mg/dL (ref 8.7–10.4)
CHLORIDE: 100 mmol/L (ref 98–107)
CO2: 27 mmol/L (ref 20.0–31.0)
CREATININE: 0.94 mg/dL (ref 0.55–1.02)
EGFR CKD-EPI (2021) FEMALE: 71 mL/min/{1.73_m2} (ref >=60)
GLUCOSE RANDOM: 233 mg/dL — ABNORMAL HIGH (ref 70–179)
POTASSIUM: 4.3 mmol/L (ref 3.5–5.1)
PROTEIN TOTAL: 6.7 g/dL (ref 5.7–8.2)
SODIUM: 138 mmol/L (ref 135–145)

## 2023-07-14 LAB — HIV ANTIGEN/ANTIBODY COMBO: HIV ANTIGEN/ANTIBODY COMBO: NONREACTIVE

## 2023-07-14 LAB — PROTIME-INR
INR: 1.21
PROTIME: 13.8 s — ABNORMAL HIGH (ref 9.9–12.6)

## 2023-07-14 LAB — HEPATITIS B CORE ANTIBODY, TOTAL: HEPATITIS B CORE TOTAL ANTIBODY: NONREACTIVE

## 2023-07-14 LAB — HEPATITIS C ANTIBODY: HEPATITIS C ANTIBODY: NONREACTIVE

## 2023-07-14 LAB — HEPATITIS A ANTIBODY, IGM: HEPATITIS A IGM ANTIBODY: NONREACTIVE

## 2023-07-14 LAB — HEPATITIS B SURFACE ANTIGEN: HEPATITIS B SURFACE ANTIGEN: NONREACTIVE

## 2023-07-14 LAB — HEPATITIS B SURFACE ANTIBODY
HEPATITIS B SURFACE ANTIBODY QUANT: 87.27 m[IU]/mL — ABNORMAL HIGH (ref <8.00)
HEPATITIS B SURFACE ANTIBODY: REACTIVE — AB

## 2023-07-14 LAB — AFP TUMOR MARKER: AFP-TUMOR MARKER: 5 ng/mL (ref <=8)

## 2023-07-14 NOTE — Unmapped (Signed)
 Urine was collected and sent to the lab.

## 2023-07-14 NOTE — Unmapped (Signed)
[facility]  07/14/2023    Reason for visit: Follow up for Other cirrhosis of liver (CMS-HCC) [K74.69]    Assessment/Plan:    58 y.o. 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 Her cirrhosis has been complicated by clinically significant portal hypertension and hepatic encephalopathy. EGD 01/15/23, G1 EV and non bleeding gastric ulcers (H. Pylori negative). 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 Presents today for LTEC Phase 1 evaluation.     1. Decompensated MASLD cirrhosis: patients has a history of decompensated cirrhosis which has mainly been complicated by hepatic encephalopathy now s/p splenorenal shunt embolization with some improvement in her HE. Currently her MELD remains too low for a DCD liver transplant but she would like to be consider for a living donor transplant evaluation.   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 Of note, would be concerned about worsening after her splenorenal shunt embolization on 02/17/2023 but will hold off on endoscopy since she is tolerating carvedilol.   Continue carvedilol 6.25mg  BID  Ascites: well controlled on lasix 80 and spironolactone 50  HCC: Negative MRI 06/23/2023  Repeat every 6 months  Hepatic Encephalopathy: Continues to be an issue  Continue lactulose with 3-5 bowel movements daily, discussed dosing extensively today.   Continue rifaximin 550mg  BID   Transplant: Starting evaluation with phase one LTEC today for possible living donor transplant.     2. Pruritus: Currently well controlled.   Continue naltrexone 50 mg daily    Health Care Maintenance:  - SBP prophylaxis [criteria: h/o SBP, or CP >= 9 w/ bili >= 3, renal insufficiency, or Na <= 130]: N/A  - Nutrition: wt & exercise mgmnt, low Na intake and bedtime snack discussed. Discussed transitioning snacks to protein-based snacks   - OTC agents: proper use of acetaminophen and avoidance of NSAIDs & herbal/dietary supplements discussed.  - Counseled on need to avoid shellfish  - Osteoporosis: Increased risk of osteopenia in patients with cirrhosis. Recommend checking Vitamin D level as well as DEXA scan if abnormal.   - Cervical Cancer: negative pap smear 05/2017, now s/p hysterectomy  - Mammogram: 10/28/22 with BI-RADS 1, repeat in one year  - Colon Cancer: negative on 01/15/2023, repeat in 7 years (one polyp)     #. Vaccinations: Vaccines: HAV vaccinated 09/2022, HBV vaccinated 09/2022, influenza (annual); PCV20 06/18/2023; Shingrix (age 94+) 05/15/2021 dose 2; Tdap 06/17/2022; SARS-CoV-2 booster today (07/14/23)     Has follow up scheduled for 09/29/2022 with Dr. Eudelia Bunch.     Patient seen and plan formulated with attending, Dr. Eudelia Bunch.    Weldon Picking, MD  Transplant Hepatology Fellow     Subjective   History of Present Illness   Accompanied by: Step Daughter    58 y.o. 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 Her cirrhosis has been complicated by clinically significant portal hypertension and hepatic encephalopathy. EGD 01/15/23, G1 EV and non bleeding gastric ulcers (H. Pylori negative). 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     She was last seen in clinic on 06/17/2023 and presents today as part of evaluation for the LTEC (Liver Transplant Evaluation Clinic). She has since been admitted  from 1/4 to 1/5 with hepatic encephalopathy in setting of holding her lactulose for several days. Her workup including CT head was negative. No paracentesis due to minimal ascites. She was restarted on lactulose with improvement in her encephalopathy.     She presents today for transplant evaluation. She still has some encephalopathy today. She is currently taking lactulose around 3 times a day with only one bowel movement per day. She also takes rifaximin 550 BID. Her fluid is well controlled with no leg swelling and no ascites.     Objective   Physical Exam   Vital Signs: BP 132/65 (BP Site: R Arm, BP Position: Sitting, BP Cuff Size: X-Large)  - Pulse 88  - Temp 36.8 ??C (98.2 ??F) (Tympanic)  - Ht 163.8 cm (5' 4.49)  - Wt 91.6 kg (201 lb 14.4 oz)  - SpO2 97%  - BMI 34.13 kg/m??   Constitutional: She is in no apparent distress  Eyes: Anicteric sclerae  Cardiovascular: No peripheral edema  Gastrointestinal: Soft, slight tenderness in the RUQ, otherwise non-tender.   Neurologic: Awake, alert, and oriented to person, place, and time with normal speech and no asterixis    Lab Results   Component Value Date    Total Bilirubin 2.1 (H) 07/14/2023    Total Bilirubin 0.6 06/26/2015    AST 62 (H) 07/14/2023    AST 22 06/26/2015    ALT 48 07/14/2023    ALT 24 06/26/2015    Alkaline Phosphatase 102 07/14/2023    Alkaline Phosphatase 65 06/26/2015     Lab Results   Component Value Date    AFP-Tumor Marker 5 07/14/2023     MELD 3.0: 13 at 07/14/2023  7:46 AM  Calculated from:  Serum Creatinine: 0.94 mg/dL (Using min of 1 mg/dL) at 07/13/1094  0:45 AM  Serum Sodium: 138 mmol/L (Using max of 137 mmol/L) at 07/14/2023  7:46 AM  Total Bilirubin: 2.1 mg/dL at 4/0/9811  9:14 AM  Serum Albumin: 3.1 g/dL at 01/13/2955  2:13 AM  INR(ratio): 1.21 at 07/14/2023  7:46 AM  Age at listing (hypothetical): 57 years  Sex: Female at 07/14/2023  7:46 AM    TTE 11/08/2022:  Summary    1. Transcatheter atrial septal defect closure (32 mm Gore Cardioform device,  09/12/2022).    2. The left ventricle is normal in size with normal wall thickness.    3. The left ventricular systolic function is normal, LVEF is visually  estimated at > 55%.    4. The right ventricle is normal in size, with normal systolic function.    5. There are no significant valvular abnormalities.    6. The atrial septal closure device is well-seated with no evidence of an  interatrial communication by color Doppler examination.    7. An agitated saline contrast study could not be performed.    8. Limited study to assess atrial septal defect closure.    Patient is taking diuretics due to ascites and requires monitoring of electrolytes and renal function for signs of toxicity

## 2023-07-14 NOTE — Unmapped (Signed)
Pt admitted fom CCU. VSS. Pt stooling. Hard copy of AVS printed and given to pt. PIV removed per order. Follow up appts reviewed. Discharge instructions reviewed. New/changed/stopped meds reviewed and educated on. Pt verbalized understanding of discharge paperwork.     Problem: Adult Inpatient Plan of Care  Goal: Plan of Care Review  Outcome: Discharged to Home  Goal: Patient-Specific Goal (Individualized)  Outcome: Discharged to Home  Goal: Absence of Hospital-Acquired Illness or Injury  Outcome: Discharged to Home  Intervention: Identify and Manage Fall Risk  Recent Flowsheet Documentation  Taken 07/13/2023 1600 by Odis Hollingshead, RN  Safety Interventions:   fall reduction program maintained   lighting adjusted for tasks/safety   low bed   nonskid shoes/slippers when out of bed  Taken 07/13/2023 1400 by Odis Hollingshead, RN  Safety Interventions:   fall reduction program maintained   low bed   lighting adjusted for tasks/safety   nonskid shoes/slippers when out of bed  Taken 07/13/2023 1200 by Odis Hollingshead, RN  Safety Interventions:   fall reduction program maintained   lighting adjusted for tasks/safety   low bed   nonskid shoes/slippers when out of bed  Intervention: Prevent Skin Injury  Recent Flowsheet Documentation  Taken 07/13/2023 1200 by Odis Hollingshead, RN  Positioning for Skin: Supine/Back  Intervention: Prevent and Manage VTE (Venous Thromboembolism) Risk  Recent Flowsheet Documentation  Taken 07/13/2023 1600 by Odis Hollingshead, RN  Anti-Embolism Device Status: Refused  Taken 07/13/2023 1400 by Odis Hollingshead, RN  Anti-Embolism Device Status: Refused  Taken 07/13/2023 1200 by Odis Hollingshead, RN  Anti-Embolism Device Status: Refused  Goal: Optimal Comfort and Wellbeing  Outcome: Discharged to Home  Goal: Readiness for Transition of Care  Outcome: Discharged to Home  Goal: Rounds/Family Conference  Outcome: Discharged to Home     Problem: Fall Injury Risk  Goal: Absence of Fall and Fall-Related Injury  Outcome: Discharged to Home  Intervention: Promote Injury-Free Environment  Recent Flowsheet Documentation  Taken 07/13/2023 1600 by Odis Hollingshead, RN  Safety Interventions:   fall reduction program maintained   lighting adjusted for tasks/safety   low bed   nonskid shoes/slippers when out of bed  Taken 07/13/2023 1400 by Odis Hollingshead, RN  Safety Interventions:   fall reduction program maintained   low bed   lighting adjusted for tasks/safety   nonskid shoes/slippers when out of bed  Taken 07/13/2023 1200 by Odis Hollingshead, RN  Safety Interventions:   fall reduction program maintained   lighting adjusted for tasks/safety   low bed   nonskid shoes/slippers when out of bed     Problem: Skin Injury Risk Increased  Goal: Skin Health and Integrity  Outcome: Discharged to Home  Intervention: Optimize Skin Protection  Recent Flowsheet Documentation  Taken 07/13/2023 1200 by Odis Hollingshead, RN  Pressure Reduction Techniques: frequent weight shift encouraged     Problem: Self-Care Deficit  Goal: Improved Ability to Complete Activities of Daily Living  Outcome: Discharged to Home

## 2023-07-14 NOTE — Unmapped (Signed)
Physician Discharge Summary [facility]  1 BT[facility]  [address]  [place] [address]  Dept: [phone]  Loc: [phone]     Identifying Information:   Denise York  Apr 04, 1966  [id number]    Primary Care Physician: Marca Ancona, MD   Code Status: Full Code    Admit Date: 07/12/2023    Discharge Date: 07/13/2023     Discharge To: Home    Discharge Service: [facility] - FAM [place] [facility]     Discharge Attending Physician: Hewitt Shorts, MD    Discharge Diagnoses:  Principal Problem:    Hepatic encephalopathy (CMS-HCC)  Active Problems:    Fatty liver disease, nonalcoholic    Type 2 diabetes mellitus without complication (CMS-HCC)    Other cirrhosis of liver (CMS-HCC)    Portal hypertension (CMS-HCC)      Outpatient Provider Follow Up Issues:   [ ]  Ensure ongoing use of lactulose    Hospital Course:   Denise York is a 58 y.o. 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 On initial presentation she was alert and oriented X 3 but demonstrated slow, slurred speech and lethargy. Exam notable for asterixis. Ammonia level 101. CT head showed no acute intracranial process. There was low concern for SBP given lack of fever or vital sign instability, no significant abdominal distension or fluid wave on exam, and normal WBC count. Patient was restarted on lactulose to good effect. At time of discharge, patients mental status had improved significantly and there was no longer asterixis on exam. Per family at bedside, patient was acting like her usual self. Patient was assessed to be stable for discharge with close follow-up outpatient. She has an extensive outpatient transplant hepatology work-up scheduled for tomorrow in preparation for possible liver transplant from her sister and also has PCP follow-up scheduled on 1/7.     Medications for chronic conditions were continued during admission other than metformin and Ozempic, which she will resume on discharge.       Touchbase with Outpatient Provider:  Warm Handoff: Completed on 07/13/23 by Katherine Roan, MD  (Resident) via [facility]    Procedures:  None  No admission procedures for hospital encounter.  ______________________________________________________________________  Discharge Medications:     Your Medication List        START taking these medications      lactulose 20 gram packet  Commonly known as: CEPHULAC  Take 1 packet (20 g total) by mouth Three (3) times a day.            CHANGE how you take these medications      carvedilol 6.25 MG tablet  Commonly known as: COREG  Take 1 tablet (6.25 mg total) by mouth two (2) times a day. Please add to pill pack  What changed: Another medication with the same name was removed. Continue taking this medication, and follow the directions you see here.     naltrexone 50 mg tablet  Commonly known as: DEPADE  Take 1 tablet (50 mg total) by mouth daily. Please add to pill packs  What changed: Another medication with the same name was removed. Continue taking this medication, and follow the directions you see here.  CONTINUE taking these medications      AEROCHAMBER MV inhaler  Generic drug: inhalational spacing device  Use as directed with inhalers     atorvastatin 20 MG tablet  Commonly known as: LIPITOR  Take 1 tablet (20 mg total) by mouth daily.     blood-glucose meter kit  Use to test blood sugar DAILY     cholecalciferol (vitamin D3-50 mcg (2,000 unit)) 50 mcg (2,000 unit) Cap  Take 2 capsules (4,000 Units total) by mouth daily.     escitalopram oxalate 10 MG tablet  Commonly known as: LEXAPRO  Take 1 tablet (10 mg total) by mouth daily.     FIBER ORAL  Take 4 capsules by mouth daily. BJs brand.     furosemide 80 MG tablet  Commonly known as: LASIX  Take 1 tablet (80 mg total) by mouth daily. Please add to pill packs     glucose blood test strip  Generic drug: blood sugar diagnostic  Use to test blood sugar daily     lancets Misc  Use to test blood sugar daily     loratadine 10 mg tablet  Commonly known as: CLARITIN  Take 1 tablet (10 mg total) by mouth daily.     magnesium oxide 400 mg (241.3 mg elemental) tablet  Commonly known as: MAG-OX  Take 1 tablet (400 mg total) by mouth daily.     metFORMIN 500 MG 24 hr tablet  Commonly known as: GLUCOPHAGE-XR  Take 1 tablet (500 mg total) by mouth daily with evening meal.     multivitamin per tablet  Commonly known as: TAB-A-VITE/THERAGRAN  Take 1 tablet by mouth daily.     omeprazole 20 MG capsule  Commonly known as: PriLOSEC  Take 1 capsule (20 mg total) by mouth Two (2) times a day (30 minutes before a meal).     OZEMPIC 2 mg/dose (8 mg/3 mL) Pnij  Generic drug: semaglutide  Inject 2 mg under the skin every seven (7) days.     spironolactone 50 MG tablet  Commonly known as: ALDACTONE  Take 1 tablet (50 mg total) by mouth daily.     XIFAXAN 550 mg Tab  Generic drug: rifAXIMin  Take 1 tablet (550 mg total) by mouth two (2) times a day.              Allergies:  Penicillins, Shellfish containing products, Glipizide, and Lisinopril  ______________________________________________________________________  Pending Test Results (if blank, then none):  Pending Labs       Order Current Status    Urine Culture In process            Most Recent Labs:  All lab results last 24 hours -   Recent Results (from the past 24 hours)   Toxicology Screen, Urine    Collection Time: 07/13/23 12:47 AM   Result Value Ref Range    Amphetamines Screen, Ur Negative <500 ng/mL    Barbiturates Screen, Ur Negative <200 ng/mL    Benzodiazepines Screen, Urine Negative <200 ng/mL    Cannabinoids Screen, Ur Negative <20 ng/mL    Methadone Screen, Urine Negative <300 ng/mL    Cocaine(Metab.)Screen, Urine Negative <150 ng/mL    Opiates Screen, Ur Negative <300 ng/mL    Fentanyl Screen, Ur Negative <1.0 ng/mL    Oxycodone Screen, Ur Negative <100 ng/mL    Buprenorphine, Urine Negative <5 ng/mL   Urinalysis with Microscopy with Culture Reflex    Collection Time: 07/13/23 12:47 AM   Result Value  Ref Range    Color, UA Yellow     Clarity, UA Cloudy     Specific Gravity, UA 1.034 (H) 1.003 - 1.030    pH, UA 5.5 5.0 - 9.0    Leukocyte Esterase, UA Small (A) Negative    Nitrite, UA Negative Negative    Protein, UA Trace (A) Negative    Glucose, UA Negative Negative    Ketones, UA Trace (A) Negative    Urobilinogen, UA 4.0 mg/dL (A) <0.9 mg/dL    Bilirubin, UA Negative Negative    Blood, UA Negative Negative    RBC, UA 3 <=4 /HPF    WBC, UA 18 (H) 0 - 5 /HPF    Squam Epithel, UA 8 (H) 0 - 5 /HPF    Bacteria, UA Many (A) None Seen /HPF    Mucus, UA Few (A) None Seen /HPF   Comprehensive metabolic panel    Collection Time: 07/13/23  5:43 AM   Result Value Ref Range    Sodium 143 135 - 145 mmol/L    Potassium 3.9 3.5 - 5.1 mmol/L    Chloride 103 98 - 107 mmol/L    CO2 28.2 20.0 - 31.0 mmol/L    Anion Gap 12 5 - 14 mmol/L    BUN 21 9 - 23 mg/dL    Creatinine 8.11 9.14 - 1.02 mg/dL    BUN/Creatinine Ratio 24     eGFR CKD-EPI (2021) Female 79 >=60 mL/min/1.39m2    Glucose 114 70 - 179 mg/dL    Calcium 9.5 8.7 - 78.2 mg/dL    Albumin 2.6 (L) 3.4 - 5.0 g/dL    Total Protein 6.0 5.7 - 8.2 g/dL    Total Bilirubin 1.4 (H) 0.3 - 1.2 mg/dL    AST 55 (H) <=95 U/L    ALT 43 10 - 49 U/L    Alkaline Phosphatase 86 46 - 116 U/L   CBC    Collection Time: 07/13/23  5:43 AM   Result Value Ref Range    WBC 3.8 3.6 - 11.2 10*9/L    RBC 3.48 (L) 3.95 - 5.13 10*12/L    HGB 10.0 (L) 11.3 - 14.9 g/dL    HCT 62.1 (L) 30.8 - 44.0 %    MCV 85.3 77.6 - 95.7 fL    MCH 28.9 25.9 - 32.4 pg    MCHC 33.9 32.0 - 36.0 g/dL    RDW 65.7 (H) 84.6 - 15.2 %    MPV 9.8 6.8 - 10.7 fL    Platelet 39 (L) 150 - 450 10*9/L   PT-INR    Collection Time: 07/13/23  5:43 AM   Result Value Ref Range    PT 16.5 (H) 9.9 - 12.6 sec    INR 1.45    POCT Glucose    Collection Time: 07/13/23  7:57 AM   Result Value Ref Range    Glucose, POC 108 70 - 179 mg/dL   POCT Glucose    Collection Time: 07/13/23 11:27 AM   Result Value Ref Range    Glucose, POC 240 (H) 70 - 179 mg/dL       Relevant Studies/Radiology (if blank, then none):  CT Head Wo Contrast  Result Date: 07/12/2023  EXAM: Computed tomography, head or brain without contrast material. ACCESSION: [id number] UN CLINICAL INDICATION: 58 years old Female with hepatic encephalopathy, altered mental status, concern for bleed or swelling  COMPARISON: 05/03/2021 TECHNIQUE: Axial CT images of the head  from skull base to vertex without contrast.  FINDINGS: There is no midline shift. No mass lesion. There is no evidence of acute infarct.  The sinuses are pneumatized. No Intracranial hemorrhage or skull fractures.     No acute intracranial process.     ECG 12 Lead  Result Date: 07/12/2023  NORMAL SINUS RHYTHM MINIMAL VOLTAGE CRITERIA FOR LVH, MAY BE NORMAL VARIANT ( R in aVL ) BORDERLINE ECG WHEN COMPARED WITH ECG OF 31-May-2023 13:45, NO SIGNIFICANT CHANGE WAS FOUND Confirmed by Lavonna Monarch [phone]) on 07/12/2023 6:05:32 PM   ______________________________________________________________________  Discharge Instructions:   Activity Instructions       Activity as tolerated                  Other Instructions       Discharge instructions      You were admitted to [facility] for hepatic encephalopathy, likely due to not taking your lactulose. It is important you take this every day, adjusting the dose as instructed by your liver doctor to have 3-5 soft bowel movement a day. We recommend starting with 3 doses a day. This will help prevent accumulation of toxins in the bloodstream that your liver is unable to remove.     Please follow-up with your liver transplant team tomorrow morning.    Please carefully read and follow these instructions below upon your discharge:    1) Please take your medications as prescribed and note the changes listed on your discharge. At future follow-up appointments, please be sure to take all of your medications with you so your provider can better guide your care.     2) Seek medical care with your primary care doctor or local Emergency Room or Urgent Care if you develop any changes in your mental status, worsening abdominal pain, fevers greater than 101.5, any unexplained/unrelieved shortness of breath, uncontrolled nausea and vomiting that keeps you from remaining hydrated or taking your medication, or any other concerning symptoms.     3) Please go to your follow-up appointments. Some of your follow-up appointments have been listed below. If you do not see an appointment listed below with your primary care doctor, please call your doctor's office as soon as possible to schedule an appointment to be seen within 7-10 days of discharge.     4) If you have any concerns before you are able to follow-up with your primary care doctor, you can reach [place] by calling [phone] and asking to page the [facility] Team  resident on call.                 Follow Up instructions and Outpatient Referrals     Discharge instructions          Appointments which have been scheduled for you      Jul 14, 2023 8:00 AM  (Arrive by 7:30 AM)  LAB ONLY with LAB PHLEB GRND UNCW  LAB PHLEB GRND F[facility] [employer] [facility] REGION) [address]  [place] [address]  [phone]        Jul 14, 2023 9:00 AM  (Arrive by 8:30 AM)  LIVER PAT ORIENTATION CLASS with LIVER ORIENTATION CLASS  [facility] LIVER TRANSPLANT [place] [facility] REGION) [address]  [place] [address]  [phone]        Jul 14, 2023 10:00 AM  (Arrive by 9:30 AM)  NEW NUTRITION with Jorja Loa Bridgens, RD/LDN  [facility] NUTRITION SERVICES TRANSPLANT [place]) [address]  [place] [address]  [phone]        Jul 14, 2023 12:00 PM  (Arrive by 11:30 AM)  NEW 30 with Chirag Lanney Gins, MD  [facility] TRANSPLANT SURGERY [facility] REGION) [address]  [place] [address]  [phone]        Jul 14, 2023 12:30 PM  (Arrive by 12:00 PM)  NURSE  30 with [facility]  [facility] TRANSPLANT SURGERY [facility] REGION) [address]  [place] [address]  [phone]        Jul 14, 2023 1:00 PM  (Arrive by 12:30 PM)  NEW  HEPATOLOGY with Kennedy Bucker, MD  [facility] LIVER TRANSPLANT [place] [facility] REGION) [address]  [place] [address]  [phone]        Jul 14, 2023 2:00 PM  (Arrive by 1:30 PM)  NEW  PSYCHOLOGY with Suzan Nailer, PsyD  [facility] TRANSPLANT SURGERY [place] [facility] REGION) [address]  [place] [address]  [phone]        Jul 14, 2023 3:00 PM  (Arrive by 2:30 PM)  SOCIAL WORK with Smiley Houseman  [facility] LIVER TRANSPLANT [place] [facility] REGION) [address]  [place] [address]  [phone]        Jul 15, 2023 11:00 AM  (Arrive by 10:45 AM)  RETURN CONTINUITY with Marca Ancona, MD  [facility] REGION) [address] [place] [address]  [phone]        Sep 03, 2023 10:00 AM  (Arrive by 9:45 AM)  NEW GENERAL PCP with Corinda Gubler, PA  [facility] [place] [facility] REGION) [address]  [facility] [address]  [place] [address]  [phone]        Sep 29, 2023 3:00 PM  (Arrive by 2:30 PM)  RETURN  HEPATOLOGY with Kennedy Bucker, MD  [facility] LIVER TRANSPLANT [place] [facility] REGION) [address]  [place] [address]  [phone]        Dec 17, 2023 3:00 PM  RETURN CARDIOLOGY with Massie Maroon, MD  [facility] CARDIOLOGY AT [facility] REGION) [address] [place] [address]  [phone] Feb 24, 2024 9:30 AM  (Arrive by 9:05 AM)  NEW NEUROLOGY with Garnette Scheuermann, MD  [facility] NEUROLOGY CLINIC [place] [facility] REGION) [address]  [place] [address]  [phone]             ______________________________________________________________________  Discharge Day Services:  BP 124/56  - Pulse 72  - Temp 36.5 ??C (97.7 ??F) (Oral)  - Resp 16  - Ht 163.8 cm (5' 4.49)  - Wt 91 kg (200 lb 9.9 oz)  - SpO2 100%  - BMI 33.92 kg/m??   Pt seen on the day of discharge and determined appropriate for discharge.    GEN: well appearing, lying in bed, NAD  HEENT: NCAT, MMM. EOMI.  Neck: Supple.  CV: Regular rate and rhythm. No murmurs/rubs/gallops.  Pulm: CTAB. No wheezing, crackles, or rhonchi.  Abd: Flat.  Mild TTP of LLQ. No guarding, rebound.  Normoactive bowel sounds.  No fluid wave.   Neuro: A&O x 3. No focal deficits. Resting tremor,  no asterixis.   Ext: No peripheral edema.  Palpable distal pulses.    Condition at Discharge: fair    Length of Discharge: I spent greater than 30 mins in the discharge of this patient.    Dala Dock, MD  Family Medicine, PGY 2

## 2023-07-14 NOTE — Unmapped (Signed)
CONFIDENTIAL PSYCHOLOGICAL EVALUATION FOR TRANSPLANT    Patient Name: Denise York  Medical Record Number: [id number]  Date of Service: July 14, 2023  Clinical Psychologist: Hilbert Corrigan, PsyD  Evaluation Duration and Procedures: 60 minute clinical interview; record review; case consultation     This evaluation note may contain sensitive and confidential information regarding the patient???s psychosocial adjustment to living with a chronic medical condition. DO NOT share this information outside [facility] without written consent from the patient explicitly stating that mental health records may be released.     The limits of confidentiality and the purpose of the evaluation were reviewed. The patient was provided with a verbal description of the nature and purpose of the psychological evaluation. I also reviewed the referral source, specific referral question for this evaluation, foreseeable risks/discomforts, benefits, limits of confidentiality, and mandatory reporting requirements of this provider. The patient was given the opportunity to ask questions and receive answers about the present evaluation. Oral consent was provided by the patient.     BACKGROUND INFORMATION/REASON FOR REFERRAL: Ms.  Hugie was seen for a psychological evaluation as one part of a comprehensive assessment for liver transplantation and for treatment planning. She is a 58 y.o. married Caucasian female from [place], [place]. She has been diagnosed with MASH. 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 She is currently in the evaluation phase of the [facility] Transplant List.     BEHAVIORAL OBSERVATIONS:   Ms. Telford arrived for her appointment on time. She was interviewed with her step-daughter, [name] (35) present. Rapport was easily established. She did not seem motivated to present herself in an overly favorable light. Sometimes Ms. Dershem would have long pauses before responding as she searched for the word she wanted to use. Sometimes, she was confused and either she looked at her step-daughter for assistance or she asked for the question to be repeated.     MENTAL STATUS EXAM:  Appearance: Malnourished  Motor: No abnormal movements  Speech/Language: Impaired articulation  Mood:  Good  Affect: Mood congruent  Thought Process: Logical, linear, clear, coherent, goal directed  Thought Content: Denies SI, HI, self harm, delusions, obsessions, paranoid ideation, or ideas of reference  Perceptual Disturbances: Denies auditory and visual hallucinations, behavior not concerning for response to internal stimuli  Orientation: Oriented to person, place, time, and general circumstances  Attention: Able to fully attend without fluctuations in consciousness  Concentration:  Sometimes Ms. Mealor would have long pauses before being able to respond to questions asked of her.  Memory: Immediate, short-term, long-term, and recall grossly intact  Fund of Knowledge: Consistent with level of education and development  Insight: Intact  Judgment: Intact  Impulse Control: Intact  Health Literacy Estimation:  Good       HEALTH HISTORY:  Onset: Ms. Gregorich was diagnosed a year ago with cirrhosis.  Current Symptoms: Fatigue, edema, HE, and tremors.   Pain (0=no pain; 10=worst pain imaginable):  Ms. Mckeague rarely experiences pain, however when she does it is located in her back and her pain level is a 10/10.   Pain Medications:   Ms. Manners and [name] indicated that they were told that she should not take tylenol. The last time she was in the ED, she was given a salonpass patch and was told to use this instead of Tylenol.  Pain Coping: Good  Family Health History:  Family History   Problem Relation Age of Onset    Heart disease Mother     Hypertension Mother     COPD Mother     Heart disease Father     Kidney disease Father Allergies Father     Gout Father     Diabetes Father     Allergies Other         FAMILY H/O    Coronary artery disease Other         FAMILY H/O    Kidney failure Other         FAMILY H/O    No Known Problems Sister     No Known Problems Daughter     No Known Problems Maternal Grandmother     No Known Problems Maternal Grandfather     No Known Problems Paternal Grandmother     No Known Problems Paternal Grandfather     Alcohol abuse Maternal Uncle     Birth defects Son     Birth defects Son     BRCA 1/2 Neg Hx     Breast cancer Neg Hx     Cancer Neg Hx     Colon cancer Neg Hx     Endometrial cancer Neg Hx     Ovarian cancer Neg Hx     Substance Abuse Disorder Neg Hx     Mental illness Neg Hx      She indicated that the status of her mother is unknown. She indicated that the status of her father is unknown. She indicated that the status of her sister is unknown. She indicated that the status of her maternal grandmother is unknown. She indicated that the status of her maternal grandfather is unknown. She indicated that the status of her paternal grandmother is unknown. She indicated that the status of her paternal grandfather is unknown. She indicated that the status of her daughter is unknown. She indicated that the status of her maternal uncle is unknown. She indicated that the status of her neg hx is unknown.    Medications:   Current Outpatient Medications   Medication Sig Dispense Refill    atorvastatin (LIPITOR) 20 MG tablet Take 1 tablet (20 mg total) by mouth daily. 90 tablet 3    blood sugar diagnostic (GLUCOSE BLOOD) Strp Use to test blood sugar daily 100 strip 3    blood-glucose meter kit Use to test blood sugar DAILY 1 each 1    carvedilol (COREG) 6.25 MG tablet Take 1 tablet (6.25 mg total) by mouth two (2) times a day. Please add to pill pack 60 tablet 11    cholecalciferol, vitamin D3, 2,000 unit cap Take 2 capsules (4,000 Units total) by mouth daily. 1 each 0    DAILY-VITE, WITH FOLIC ACID, 400 mcg Tab tablet Take 1 tablet by mouth daily.      escitalopram oxalate (LEXAPRO) 10 MG tablet Take 1 tablet (10 mg total) by mouth daily. 90 tablet 3    furosemide (LASIX) 80 MG tablet Take 1 tablet (80 mg total) by mouth daily. Please add to pill packs 90 tablet 0    inhalational spacing device (AEROCHAMBER MV) Spcr Use as directed with inhalers 1 each 0    lactulose (CEPHULAC) 20 gram packet Take 1 packet (20 g total) by mouth Three (3) times a day.      lancets Misc Use to test blood sugar daily 100 each 3    loratadine (CLARITIN) 10 mg tablet Take 1 tablet (  10 mg total) by mouth daily. 90 tablet 1    magnesium oxide (MAG-OX) 400 mg (241.3 mg elemental magnesium) tablet Take 1 tablet (400 mg total) by mouth daily. 90 tablet 3    metFORMIN (GLUCOPHAGE-XR) 500 MG 24 hr tablet Take 1 tablet (500 mg total) by mouth daily with evening meal. 90 tablet 3    multivitamin (TAB-A-VITE/THERAGRAN) per tablet Take 1 tablet by mouth daily. 90 tablet 3    naltrexone (DEPADE) 50 mg tablet Take 1 tablet (50 mg total) by mouth daily. Please add to pill packs 30 tablet 11    nitrofurantoin, macrocrystal-monohydrate, (MACROBID) 100 MG capsule Take 1 capsule (100 mg total) by mouth two (2) times a day for 5 days. (Patient not taking: Reported on 07/15/2023) 10 capsule 0    omeprazole (PRILOSEC) 20 MG capsule Take 1 capsule (20 mg total) by mouth Two (2) times a day (30 minutes before a meal). 180 capsule 3    psyllium seed, with sugar, (FIBER ORAL) Take 4 capsules by mouth daily. [employer]Js brand.      rifAXIMin (XIFAXAN) 550 mg Tab Take 1 tablet (550 mg total) by mouth two (2) times a day. 180 tablet 3    semaglutide (OZEMPIC) 2 mg/dose (8 mg/3 mL) PnIj Inject 2 mg under the skin every seven (7) days. (Patient not taking: Reported on 07/15/2023) 9 mL 3    spironolactone (ALDACTONE) 50 MG tablet Take 1 tablet (50 mg total) by mouth daily. 90 tablet 3     No current facility-administered medications for this visit.     Psychiatric/Medical History:  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    Obesity     Prediabetes      Surgical History:  Past Surgical History:   Procedure Laterality Date    ABDOMINAL WALL MESH  REMOVAL  1997, 2005    Placement, 1997, removal and replacement 2005    BLADDER SURGERY      bladder tact X2    BREAST BIOPSY Right 04/2013    benign    BREAST EXCISIONAL BIOPSY Right 05/2013    benign    BREAST SURGERY Right     precancerous spot removed    CHOLECYSTECTOMY      HYSTERECTOMY      IR EMBOLIZATION VENOUS OTHER THAN HEMORRHAGE  02/17/2023    IR EMBOLIZATION VENOUS OTHER THAN HEMORRHAGE 02/17/2023 Ammie Dalton, MD IMG VIR [facility]    PR COLSC FLX W/RMVL OF TUMOR POLYP LESION SNARE TQ N/A 10/14/2016    Procedure: COLONOSCOPY FLEX; W/REMOV TUMOR/LES BY SNARE;  Surgeon: Alfred Levins, MD;  Location: [facility] GI PROCEDURES [place];  Service: Gastroenterology    PR COLSC FLX W/RMVL OF TUMOR POLYP LESION SNARE TQ N/A 01/15/2023    Procedure: COLONOSCOPY FLEX; W/REMOV TUMOR/LES BY SNARE;  Surgeon: Mickie Hillier, MD;  [facility]: [facility];  Service: Gastroenterology    PR PERC CLOS,CONG INTERATRIAL COMMUN W/IMPL N/A 09/12/2022    Procedure: ASD closure;  Surgeon: Jacquelyne Balint, MD;  Location: [facility] CATH;  Service: Cardiology    PR UPPER GI ENDOSCOPY,BIOPSY N/A 01/15/2023  Procedure: UGI ENDOSCOPY; WITH BIOPSY, SINGLE OR MULTIPLE;  Surgeon: Mickie Hillier, MD;  Location: [facility];  Service: Gastroenterology    SKIN BIOPSY         ADHERENCE ISSUES:  Medication Management: Ms. Hosek manages her medications independently using a pill pack.  Medication Adherence: Fair. Ms. Darco has only had the pill pack for two weeks. When experiencing confusion she was unsure what meds to take and sometimes she forgot to take her medications. The pill pack has helped her to improve her medication adherence.  Attendance to appointments: Excellent     Labwork:  Ms. Borowy MELD score as of 07/14/2023 is 13. Ms. Souffront most recent A1c as of 06/01/23 was 5.2  Dialysis: N/A  Diabetes:  Fair. Ms. Worthen reports having a difficult time managing her diabetes. She has a glucometer and checks her blood glucose once or twice a week. Ms. Desarro glucose levels run in the 120's to 130's when she checks them. Ms. Sawhney most recent A1c was 5.2.     HEALTH BEHAVIORS:   Physical activity: Poor. Ms. Stache does activities around the home.   Appetite:   Ms. Enberg eats one meal daily and then will have between 1-2 boosts daily.    Sleep: Poor. Ms. Hanak sleeps a lot, however had a difficult time quantifying her sleep. Later she said she gets 6 hours of sleep overnight waking up 2-3 times. It takes her anywhere from 30 minutes to an hour to fall back asleep.     SUBSTANCE USE ISSUES:   Nicotine/Tobacco: Denied  Current/last alcohol use: Ms. Helbing denied current use and last used five years ago.  History and pattern of use: When Ms. Wailes was in her 66's and 30's she would have 4 rum and cokes on the weekends.  Past/current treatment: Denied  Illicit drug use: Denied  Licit substance abuse or misuse: Denied  Alcohol or drug-related legal problems: Denied  Relapse risk: low  Relapse risk factors: no HCC (HCC is shown to lower relapse risk, perhaps related to the belief heightened sense of urgency, higher risk of mortality, and added uncertainty of cancer progression)  Relapse protective factors: other primary cause of disease, sustained abstinence since quit date, lack of participation in activities in which substances are readily available/consumed, no family history of substance use disorder, low amount of substance consumed, no history of alcohol use disorder, no history of polysubstance use disorder, lives with positive source of support, presence of positive social supports, adherence with medical management of disease, no cravings, long duration of abstinence, no concurrent tobacco use disorder, and takes appropriate responsibility    TRANSPLANT ISSUES:  Attitude toward transplant: Ms. Cannoy wants a transplant as she feels out of sorts due to experiencing HE. She is hopeful that after surgery, she will be able to return to work.   Understanding of the transplant process: Fair  Caregivers confirmed by TSW:  Ms. Daloia husband Zollie Beckers would act as primary caregiver despite not being able to drive. [name] would act as back up caregiver.   Commitment to txp process:  high    MENTAL HEALTH:   History:  Psychiatric diagnoses:  Ms. Jackson was diagnosed with depression following the deaths of both of her sons.   Psychiatric treatment: Ms. Palomar takes 10 mg of Lexapro once daily and said it has been helpful. Following the deaths of her sons 35 years ago she participated in therapy for six months.  Psychiatric hospitalizations: Denied  Suicidal or  homicidal ideation/attempt: Denied  Self-Injurious behavior: Denied  Psychotic symptoms: Denied  Trauma/abuse:  The deaths of her sons.  Cognition concerns:  Ms. Tacheny easily gets confused and described herself as being in a fog.   Family mental health history: Denied      Current:  Mood Over Past 30 Days (1:NONE to 10:HIGHEST):   --Depression: 1  --Anxiety: 1  --Irritability: 1  --Happiness: 2    Active symptoms: Ms. Derck experiences some stress when she thinks about how her sister has volunteered to be a living donor. Ms. Salman wants her to be healthy following the transplant. She denied the stress impeding her ability to function.     Coping Skills: Ms. Alders walks away if she is stressed.     FAMILY AND SOCIAL FUNCTIONING:   Family dynamics: Ms. Lambo lives at home with her husband step-daughter, and her husband Raiford Noble.   Financial stressors:  Ms. Ognibene experience stress related to being able to purchase Ozempic   Occupational stressors: She is not working at this time.   Legal history: Denied  Risk of incarceration:  low      EDUCATION: The pre- and post-transplant process was reviewed, with particular emphasis on the importance of adherence to medical directives. I reviewed the impact of the use of illicit substances, substance abuse, and alcohol use/abuse on patient and graft survival as well as factors that influence relapse risk. The consequences of noncompliance both pre- and post-transplant were reviewed. I reviewed the possibility of the development and/or worsening of psychiatric issues, including but not limited to depression, anxiety, acute and posttraumatic stress disorder, guilt, and body image issues, and encouraged the patient and their caregivers to speak with members of the transplant team if psychiatric symptoms develop or worsen. Understanding was expressed.    COLLATERAL INFORMATION:  Review of PMP Aware controlled substance database showed Prescriptions  Total: 1 - Private Pay: 0   Showing 1 Item View   1 of 1   Filled  Written  ID  Drug  QTY  Days  Prescriber  RX #  Dispenser  Refill  Daily Dose*  Pymt Type  PMP    05/14/2022 05/14/2022 1 Gabapentin 100 Mg Capsule 90.00 90 [place] [id number] [place] ([id number]) 0/1  Comm Ins [place]   Disclaimer  Showing 1 Item View   1 of 1 .    Review of [facility] Department of [employer] Assess System showed no legal history.     PSYCHIATRIC DIAGNOSES:    H/O of Unspecified depression                              IMPRESSIONS:  1. Adherence/Health Behaviors: Mild concerns. Ms. Delucia manages her medications independently using a pill pack, which she has had for the last two weeks. Prior to receiving the pill pack Ms. Hiebert either experienced confusion regarding which medication to take or forgot to take her medication. Since having the pill pack Ms. Vondrasek's medication adherence has improved. Ms. Mease reported having a difficult time managing type 2 diabetes as she checks her glucose twice weekly using a glucometer. Ms. Kayal reports that when she checks her blood glucose levels they run in the 120's to 130's. Ms. Larose is not active. She eats one meal daily with 1-2 boosts. Ms. Montas had a difficult time quantifying her sleep. Recommend close monitoring of patient's adherence to all medical directives. Ms. Grbic may benefit from a  continuous glucose monitor.   2. Substance Use Issues: Minimal concerns. Ms. Moncion denied nicotine/tobacco use, illicit drug use, and misusing licit substances. Ms. Whiteford last had alcohol five years ago. When she was in her 4's and 30's she would have 4 rum and cokes on the weekend. Recommend ongoing abstinence from all substances.   3. Transplant Issues: Mild concerns. Ms. Annable appears to have a good understanding of her disease process and a fair understanding of the transplant surgery. She had a difficult time articulating the risks associated with surgery as well as the post-transplant care required. Her expectations of life post-transplant are reasonable, and she has had significant experience with being a patient and requiring frequent medical care. She expressed strong commitment and motivation to follow medical advice after transplant as well. Recommend ongoing review of transplant education.   4. Psychological Issues: Mild concerns. Ms. Skowron was diagnosed with depression following the deaths of her sons 35 years ago. She was in therapy for 6 months following their deaths. Currently, she is taking 10 mg of Lexapro once daily. She denied active MH symptoms at this time. She denied being hospitalized for mental health reasons, self injurious behaviors, head injuries, being irritable, psychotic symptoms, and SI/HI. Losing both of her sons was traumatic. She experiences some anxiety symptoms when she thinks of her sister being a living donor candidate, however it does not appear to impede her ability to function. Recommend that Ms. Dingus continue taking the 10 mg of Lexapro once daily as it is helping. Recommend that Ms. Reaver monitor her mood and she can be re-referred to transplant psychology if she notices a worsening of her mood.   5. Social: Minimal concerns. Ms. Ginther lives with her husband, step-daughter, and her husband. Ms. Sarvis husband would act as primary caregiver even though he does not drive. [name], would act as back up caregiver. Ms. Warbington has a difficult time affording Ozempic. Recommend that the care giving plan be confirmed by TSW.     RECOMMENDATIONS:  DEMPLE CISSE is believed to be a good candidate for transplant from a psychological perspective.    We have a few concerns about Ms. Belcastro's candidacy on the transplant list.        Before the patient would be a good candidate for transplant from a psychosocial perspective, it is recommended the patient:   1. Close monitoring of patient's adherence to all medical directives  2. Continue taking 10 mg of Lexapro once daily  3. Ongoing review of transplant education    The patient would also benefit from:   1. TSW to confirm the care giving plan  2. Possibly having a continuous glucose monitor  3. Monitoring her mood and she can be re-referred to transplant psychology if she notices a worsening of her mood.      Should the patient or treatment team notice a change in functioning, please refer back to transplant psychology for further evaluation and treatment.    Final decision regarding listing status is based upon committee review at selection meeting.      Recommendations discussed with patient? yes  Agreed upon by patient? yes    Ms.  Strole was given this writer's contact information with confidential voice mail number and instructed to call 911 for emergencies.

## 2023-07-14 NOTE — Unmapped (Unsigned)
PATIENT NAME: Denise York     MR#: [id number]    DOB: April 21, 1966      [facility]  CONFIDENTIAL SOCIAL WORK  LIVER TRANSPLANT ASSESSMENT     DATE OF EVALUATION: 07/14/2023    INFORMANTS: Patient/Denise York, family/step-daughter/Kelly    PREFERRED LANGUAGE: English     PRESENTING MEDICAL PROBLEMS AND RELEVANT HISTORY:   Denise York is a 58 y.o. 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      TRANSPLANT PHASE:  Evaluation   TRANSPLANT STATUS: Active     Denise York presents today for an initial liver transplant evaluation.      UNDERSTANDING OF MEDICAL CONDITION AND TRANSPLANT:   Denise York and Denise York received transplant education on 07/14/23.      Pt understands transplant process: well  Participated in TeachBack method: well  Information recall: MELD, disease, risks  Education provided: psychosocial, care giving     Briefly reviewed the evaluation and listing process.  Reviewed the potential surgical complications of transplant such as but not limited to bleeding, blood clots, hernia, infection, rejection and death.  In addition, reviewed care giving expectations, possibility of readmissions, etc. Informed patient of the potential psychological complications of chronic illness such as depression, anxiety, guilt and PTSD and advised that it is not uncommon for patients with a previous history of such to experience an increase in symptoms should they have a decline in their health.  Denise York verbalized understanding.      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  MELD score is 21.      ATTITUDE ABOUT TRANSPLANT:   Patient desires transplant: yes  Expectations: feel better, improved quality of life   Fears/Concerns: no improvement     LIVING SITUATION AND FAMILY/SOCIAL SUPPORTS:   Citizenship Status: [place] Citizen  Marital Status: married  Lives with: Denise York; step-daughter/Kelly and her spouse/Denise York(son to Denise York)   Children/Dependents:  2 daughters deceased   Extended family: 2 sisters in [place] who are considering LDLT; parents deceased   Living situation: House  Condition of home: good repair, utilities function, appliances function, and Heat/AC function    Denise York reports feeling supported by family.    How often do you see your doctor?: annual check ups and only when sick    EDUCATION AND WORK HISTORY:   Highest completed grade level: 12th grade  Hx of special education/learning challenges: no history of educational challenges  Currently employed: No; unclear if patient is on LTD or terminated by [employer];   If yes, how long have you been employed there? Did not assess  Military History: No  Branch: N/A  Eligible for VA benefits?: N/A    MEDICAL/PRESCRIPTION COVERAGE:   Insurance:   Primary Coverage: SHP - Aetna    Secondary Coverage: na    Tertiary Coverage: na    Problems with access/coverage of medical care:  denies    FINANCIAL RESOURCES:   Current income sources: patient income from unclear if FMLA or LTD (may be eligible for SSDI as well); spouse received SSDI since 2018  Monthly expenses: other customary bills  Risk of losing home/transportation: denies  Hx of significant debts: denies  Current income meets basic needs: Yes  Fundraising efforts: no  ACCESS TO TRANSPORTATION:  Patient drives: Yes  Access to reliable transportation: Yes  Other sources of transportation:  Yes     COMPLIANCE HISTORY:  Medication list available: EPIC list available  Problems obtaining medications: denies  Problems organizing medications: denies  Problems with diet regime/fluid restrictions: denies  Barriers to attending medical care: denies    FUNCTIONAL STATUS:   Denise York presents as independent with personal ADLs, including bathing, dressing, cooking, and household chores. Patient is driving and does have a valid driver's license.     ESLD effects: admits to HE; lethargy , poor sleep, poor nutrition   Activity level:  Sedentary lifestyle  Hobbies/Interests: did not assess    COGNITIVE HISTORY & HEALTH LITERACY:  Denise York  seem cognitively intact. She reports history of memory or cognitive concerns related to her health.  She denies history of learning difficulties and learns best by Reading/Writing (e.g. text, words). She says she Always needs someone to help her when she reads instructions, pamphlets, or other written information from her doctor or pharmacy.    HOME HEALTH/DME AGENCY:  Current: denies  Past: denies  Access to the following DME (not in current use): None  Home Modifications: None    RELIGIOUS/SPIRITUAL AFFILIATION:  Patient identifies as: Non-Denominational  Impact on healthcare: No    ADVANCE DIRECTIVES:  Current AD/HCPOA: No   On file with [facility]: No  Desired healthcare agent(s): spouse/Denise York    SUBSTANCE USE HISTORY:    Cigarette smoking:   Current: denies   Past: denies  Other tobacco/nicotine use:   Current: denies   Past: denies  Alcohol use:   Current: denies   Past: denies  Illicit drug use:   Current: denies   Past: denies    CAGE SCREENING TOOL   1. Have you ever felt you should cut down on your drinking? No   2. Have people annoyed you by criticizing your drinking? No   3. Have you ever felt bad or guilty about your drinking? No   4. Have you ever had a drink first thing in the morning to steady your nerves or to get rid of a hangover (eye-opener)? No       CHRONIC PAIN HISTORY:   Regular use of pain medications: denies  Medications/Prescriber: N/A  Involvement with pain clinic:  denies    PSYCHIATRIC HISTORY:   Current issues: admits to yes; please defer to Transplant Psychology Evaluation from Dr. Hilbert Corrigan dated 07/14/23      LEGAL HISTORY:   Denise York denies open or pending legal issues. A review of [place] DPS Offender Public Information website revealed no open or pending charges or commitments. There is no history of incarceration, probation/parole or substance involved infractions (i.e., DUI, DWI, possession, trafficking). Her Probation/Parole/Post Release Status is INACTIVE.    COPING STYLE:   Current Stressors: denies  Coping Skills/Mechanisms: be with family   Stress of major surgery and other associated issues: denied       PLAN FOR TRANSPLANTATION:   Discussed expectations for pre/post caregiving needs with patient and family.  Discussed the projected length of hospitalization, need for 24-hour supervision at the time of discharge and for at least the next 6 weeks, inability to drive for a 2-3 month period, need for lab work three times a week for the first two months after transplant and less often thereafter, need for weekly follow up at [facility] for the first month after transplant and less often thereafter, and the lifetime need for  medication and compliance in all areas.     Discussed importance of having alternative/secondary caregivers in the event that primary support people are not available at the time of transplant.  Discussed that patients with any mobility issues prior to transplant could need more physical assistance after abdominal surgery and/or could be at a higher risk of becoming deconditioned more quickly.  They indicated that they understood.      Current Plan:   Also, Denise York identifies her spouse, Denise York/34 as an additional back-up as needed. He too resides in the home and works full-time for [employer] as a Civil Service fast streamer.     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  Support limitations: employment  Support strengths: historical caregiver, FMLA eligible, health literate, valid driver's license, access to reliable vehicle , comfortable driving to [facility], lives in the home, and no other CG responsibilities    Back-up Support(s): Denise York (not present)  Relationship to patient: spouse  Age/DOB: 60  Education Level: unable to assess   Employment: disabled due to vision issues   Health: unable to assess   Support limitations:  family reports inability to drive and use of RW to ambulate; will only assist while patient is at home and unable to assess / not present  Support strengths: lives in the home, no other CG responsibilities, and unable to assess / caregiver not present    TRAVEL TIME TO HOSPITAL:   Approximately 1 via car to [facility].    MENTAL STATUS:    Affect: normal, mood congruent  Appearance: well dressed, season and temperature appropriate  Attention Span: normal attention span  Attitude: friendly, cooperative, interested, attentive  Behavior: calm, cooperative, appropriate eye contact, no psychomotor agitation/retardation noted  Insight & Judgment: intact/appropriate, reliable insight  Level of Consciousness: somnolent/sleepy  Mood: euthymic/normal/stable  Orientation: person, place, time, date  Speech: normal speech  Thought Content: logical connections    EDUCATION:   Verbal education on the following topics provided to patient and family:  Advance Care Planning  Fundraising for Transplant-related expenses  Long-term financial and vocational planning  Expectations for support planning both pre- and post-transplant  Common experience for liver transplant patients to have depression and/or anxiety   Transplant social worker availability throughout transplant process    Written education or Dentist provided on the following topics:  Engineer, manufacturing systems Forms  Fundraising organizations and other financial resources  [employer]  and Social Security Disability Insurance (SSDI)    Ms. Cappiello and Kelly/ family indicated that they understood all education provided and did ask appropriate questions.     COLLATERAL CONTACT:   Denise York was present for the duration of interview and validated all information, including post-transplant support plan.     ASSESSMENT AND RECOMMENDATIONS:   SYONA CRAIGE is a 58 y.o. 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  She presents today as part of her transplant assessment.     Ms. Costantino has a number of psychosocial strengths, including motivation for transplant, adherence to medical treatment, adequate financial resources , denial of substance abuse concerns, and appropriate caregiving plan.  Ms. Piltz is pursuing transplant to improve the quality of her life. She appears to have a solid care giving plan involving multiple family members. She denies any financial, insurance housing or transportation issues. Ms. Agramonte denies any substance abuse issues and due to time constraints, please defer to Transplant Psychology Consult for behavioral health assessment.     SIPAT SCORE: 14; Good Candidate     At this time, this CSW cannot identify any psychosocial barriers that would impact consideration transplant.    Before the patient would be a good candidate for transplant from a psychosocial perspective, it is recommended the patient:   1. None identified     The patient would also benefit from:   1. Denise York to clarify if patient is on LTD via employer or if can proceed with SSDI  2. Consider  HC POA   3. Consider Transplant Fundraising     Final decision regarding listing status is based upon committee review at selection meeting.    Flint Melter, LCSW  Transplant Case Manager  [facility] {jenn mental status behavior II:65660::calm,cooperative,appropriate eye contact,no psychomotor agitation/retardation noted}  Insight & Judgment: {jenn mental status insight & judgement II:68060::intact/appropriate,reliable insight}  Level of Consciousness: {jenn mental status LOC II:68061::alert}  Mood: {jenn mental status mood II:68062::euthymic/normal/stable}  Orientation: {jenn mental status orientation II:68063::person,place,time,date}  Speech: {jenn mental status speech II:68064::normal speech}  Thought Content: {jenn mental status thought content II:68065::logical connections}    EDUCATION:   Verbal education on the following topics provided to {PATIENT/FAMILY/CAREGIVER/SO:21563}:  Advance Care Planning  Fundraising for Transplant-related expenses  Long-term financial and vocational planning  Expectations for support planning both pre- and post-transplant  Common experience for liver transplant patients to have depression and/or anxiety   Transplant social worker availability throughout transplant process    Written education or resource material provided on the following topics:  Administrator, sports of Attorney Forms  Fundraising organizations and other financial resources  {jlm txp education:37811}    Ms. Reimers *** {PATIENT/FAMILY/CAREGIVER/SO:21563} indicated that {Blank single:19197::he,she,they} understood all education provided and {Desc; did/not:14019} ask appropriate questions.     COLLATERAL CONTACT:   *** was present for {kaa portion_duration:39383} of interview and validated all information, including post-transplant support plan.     ASSESSMENT AND RECOMMENDATIONS:   ERCELLE CONTESSA is a 58 y.o. 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  She presents today as part of her transplant assessment.     Ms. Glatt has a number of psychosocial strengths, including {KGG Strengths:53323}.    ***    SIPAT SCORE: ***    At this time, this CSW {Blank single:19197::cannot identify any,identifies minimal,identifies significant} psychosocial barriers that would impact consideration transplant.     Before the patient would be a good candidate for transplant from a psychosocial perspective, it is recommended the patient:   1.   2.   3.   The patient would also benefit from:   1.   2.   3.     Final decision regarding listing status is based upon committee review at selection meeting.    {Jenn Txp SW signature list [id number]  Transplant Case Manager  [facility]

## 2023-07-14 NOTE — Unmapped (Signed)
See patient surgery visit for urine collection information.

## 2023-07-14 NOTE — Unmapped (Signed)
Transplant Surgery History and Physical      Assessment/Recommendations:    Denise York is a 58 y.o. female seen in consultation at the request of Fix, Alfonso Ellis, MD for evaluation of candidacy for liver transplantation.    I spent 45 minutes with the patient obtaining the above history and physical examination, and greater than 50% of the time was spent counseling and on the substance of the discussion.    Today we discussed liver transplantation going over the surgery to be performed, the hospital course including length of stay, anti-rejection medications and their side effects, results and the cadaveric donor system.    I discussed in detail with Denise York the risks and benefits of liver transplantation, including but not limited to: the general anesthetic, monitoring lines, the incision, the hepatectomy, as well as reimplantation of the liver graft and immunosuppressant medications. In regards to the surgical procedure, we noted that it is a major operation performed under general anesthesia with the risks of heart attack, stroke and death. Multiple invasive means of monitoring may be necessary during the operation including an arterial line, a central venous catheter, a foley catheter inserted into the bladder, and a tube from your nose into your stomach to prevent stomach distension. After surgery, the patient will go to the Intensive Care Unit and is then sent to the regular floor when medically stable. I discussed the possible complications including the need for reoperation for bleeding, infection or other complications, the possibility of clotting/leakage of blood vessels, requiring either radiological intervention, surgical intervention, or even retransplantation. I reviewed the possibility of complications involving the biliary tract including leaks, strictures and need for retransplantation for biliary complications. I discussed the possibility of primary nonfunction of the liver graft requiring urgent retransplantation or the result of death. The patient understands the need for long-term immunosuppression therapy as well as monitoring of labs and immunosuppression. Anti-rejection medications, including Prograf or Cyclosporine (Neoral), Cellcept, steroids and others, will be needed after transplantation and for the patient???s entire lifetime. Problems include infection, cancer, hirsutism, tremors, gum swelling, hypertension, bone fractures, aggravation of diabetes or new onset diabetes, cataracts, and rashes. Finally, the donor system was reviewed. All donors are tested for infections and other diseases, but there is a small chance of transmission of diseases including viruses as well as the possible transmission of tumors. Some patients may elect to receive a liver from a donor who was exposed to the Hepatitis B or Hepatitis C virus and the recipient may require certain anti-viral medications to prevent this virus from damaging the new liver.    Finally, I reviewed with the patient how the surgery is expected to improve their health and quality of life, that the average length of hospitalization stay is 10-12 days, and that the length of their expected recovery period, including when normal daily activities may be resumed, will be patient dependent.    It was additionally discussed that patient has been losing weight and was encouraged to continue to do so today, as this will decrease her risk for both perioperative and post-transplant complications. It was discussed that it was important for her to continue to lose weight while making sure she has plenty of protein intake so that she does not become malnourished prior to operation.     Denise York had all their questions answered and wishes to proceed with the liver transplant evaluation process.    This patient was seen and evaluated with Dr. Edwin Dada with no  contraindication for transplant from a surgical perspective pending completion of her evaluation.  Recommendations:   -Suggest continuing  Ozempic to encourage weight loss, goal weight loss another 3-4 kg  Has recurrent episodes of HE.  -Encourage high po protein intake and physical activity to facilitate appropriate preoperative nutrition   -Ongoing transplant pre-evaluation  Last HbA1C on 06/01/23 : 5.2  Has potential living donors who will start evaluation.  Prior Lap chole.  Prior Transcatheter ASD closure in 2024. Echo from May within normal range  Will need complete cardiology work up as per protocol for MASLD and DM.  Patent PV with prior VIR closure of umbilical vein.    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 She has never required a paracentesis or had any bleeding esophageal varices. Relevant comorbidities include obesity, T2DM, HTN. HLD. Previous abdominal surgical hx of laparoscopic cholecystectomy. 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 Additionally, she has a hx of previous atrial septal defect repair, last ECHO in May 2024 showed ASD appropriately closed and otherwise WNL.     Patient seen and examined in clinic today with stepdaughter present at bedside. She reports that she first started having issues related to her liver approximately 1 year ago. She has been hospitalized twice in the past 3 months related to hepatic encephelopathy. Otherwise has some baseline mild confusion and weakness that started approximately 1 year ago. Eats well and has regular bowel movements. Prior to 1 year ago she was able to care for herself and perform her ADLs with ease, however since the progression of her liver disease she is mostly reliant on family members for assistance with ADLs.       Allergies    Penicillins, Shellfish containing products, Glipizide, and Lisinopril      Medications      Current Outpatient Medications   Medication Sig Dispense Refill    DAILY-VITE, WITH FOLIC ACID, 400 mcg Tab tablet Take 1 tablet by mouth daily.      atorvastatin (LIPITOR) 20 MG tablet Take 1 tablet (20 mg total) by mouth daily. 90 tablet 3    blood sugar diagnostic (GLUCOSE BLOOD) Strp Use to test blood sugar daily 100 strip 3    blood-glucose meter kit Use to test blood sugar DAILY 1 each 1    carvedilol (COREG) 6.25 MG tablet Take 1 tablet (6.25 mg total) by mouth two (2) times a day. Please add to pill pack 60 tablet 11    cholecalciferol, vitamin D3, 2,000 unit cap Take 2 capsules (4,000 Units total) by mouth daily. 1 each 0    escitalopram oxalate (LEXAPRO) 10 MG tablet Take 1 tablet (10 mg total) by mouth daily. 90 tablet 3    furosemide (LASIX) 80  MG tablet Take 1 tablet (80 mg total) by mouth daily. Please add to pill packs 90 tablet 0    inhalational spacing device (AEROCHAMBER MV) Spcr Use as directed with inhalers 1 each 0    lactulose (CEPHULAC) 20 gram packet Take 1 packet (20 g total) by mouth Three (3) times a day.      lancets Misc Use to test blood sugar daily 100 each 3    loratadine (CLARITIN) 10 mg tablet Take 1 tablet (10 mg total) by mouth daily. 90 tablet 1    magnesium oxide (MAG-OX) 400 mg (241.3 mg elemental magnesium) tablet Take 1 tablet (400 mg total) by mouth daily. 90 tablet 3    metFORMIN (GLUCOPHAGE-XR) 500 MG 24 hr tablet Take 1 tablet (500 mg total) by mouth daily with evening meal. 90 tablet 3    multivitamin (TAB-A-VITE/THERAGRAN) per tablet Take 1 tablet by mouth daily. 90 tablet 3    naltrexone (DEPADE) 50 mg tablet Take 1 tablet (50 mg total) by mouth daily. Please add to pill packs 30 tablet 11    omeprazole (PRILOSEC) 20 MG capsule Take 1 capsule (20 mg total) by mouth Two (2) times a day (30 minutes before a meal). 180 capsule 3    psyllium seed, with sugar, (FIBER ORAL) Take 4 capsules by mouth daily. [employer] brand.      rifAXIMin (XIFAXAN) 550 mg Tab Take 1 tablet (550 mg total) by mouth two (2) times a day. 180 tablet 3    semaglutide (OZEMPIC) 2 mg/dose (8 mg/3 mL) PnIj Inject 2 mg under the skin every seven (7) days. 9 mL 3    spironolactone (ALDACTONE) 50 MG tablet Take 1 tablet (50 mg total) by mouth daily. 90 tablet 3     No current facility-administered medications for this visit.         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    Obesity     Prediabetes          Past Surgical History    Past Surgical History:   Procedure Laterality Date    ABDOMINAL WALL MESH  REMOVAL  1997, 2005    Placement, 1997, removal and replacement 2005    BLADDER SURGERY      bladder tact X2    BREAST BIOPSY Right 04/2013    benign    BREAST EXCISIONAL BIOPSY Right 05/2013    benign    BREAST SURGERY Right     precancerous spot removed    CHOLECYSTECTOMY      HYSTERECTOMY      IR EMBOLIZATION VENOUS OTHER THAN HEMORRHAGE  02/17/2023    IR EMBOLIZATION VENOUS OTHER THAN HEMORRHAGE 02/17/2023 Ammie Dalton, MD IMG [facility]IR [facility]    PR COLSC FLX W/RMVL OF TUMOR POLYP LESION SNARE TQ N/A 10/14/2016    Procedure: COLONOSCOPY FLEX; W/REMOV TUMOR/LES BY SNARE;  Surgeon: Alfred Levins, MD;  Location: [facility]ED[facility] [place];  Service: Gastroenterology    PR COLSC FLX W/RMVL OF TUMOR POLYP LESION SNARE TQ N/A 01/15/2023    Procedure: COLONOSCOPY  FLEX; W/REMOV TUMOR/LES BY SNARE;  Surgeon: Mickie Hillier, MD;  [facility];  Service: Gastroenterology    PR PERC CLOS,CONG INTERATRIAL COMMUN W/IMPL N/A 09/12/2022    Procedure: ASD closure;  Surgeon: Jacquelyne Balint, MD;  Location: [facility] C[facility];  Service: Cardiology    PR UPPER GI ENDOSCOPY,BIOPSY N/A 01/15/2023    Procedure: UGI ENDOSCOPY; WITH BIOPSY, SINGLE OR MULTIPLE;  Surgeon: Mickie Hillier, MD;  [facility];  Service: Gastroenterology    SKIN BIOPSY           Family History    The patient's family history includes Alcohol abuse in her maternal uncle; Allergies in her father and another family member; Birth defects in her son and son; COPD in her mother; Coronary artery disease in an other family member; Diabetes in her father; Gout in her father; Heart disease in her father and mother; Hypertension in her mother; Kidney disease in her father; Kidney failure in an other family member; No Known Problems in her daughter, maternal grandfather, maternal grandmother, paternal grandfather, paternal grandmother, and sister..      Social History:    Tobacco use: never a smoker  Alcohol use: denies  Drug use: denies      Review of Systems    A 12 system review of systems was negative except as noted in HPI    Objective     PE: Blood pressure 132/65, pulse 88, temperature 36.8 ??C (98.2 ??F), temperature source Tympanic, height 163.8 cm (5' 4.49), weight 91.6 kg (201 lb 14.4 oz), SpO2 97%, not currently breastfeeding. Body mass index is 34.13 kg/m??.  General: Alert, oriented, no acute distress. Obese.   Lungs: clear to auscultation, percussion to the bases, and unlabored breathing  Heart: euvolemic, regular rate and rhythm, normal S1 and S2, no murmur  Abd: soft, non-distended, non-tender, no organomegaly or masses  Ascites: mild   Skin: no rashes, jaundice or skin lesions noted  Ext: no edema, well perfused  Neuro: non-focal exam. thought organized, appropriate affect, normal fluent speech      Test Results    Labs:  All lab results last 24 hours:    Recent Results (from the past 24 hours)   Comprehensive Metabolic Panel    Collection Time: 07/14/23  7:46 AM   Result Value Ref Range    Sodium 138 135 - 145 mmol/L    Potassium 4.3 3.5 - 5.1 mmol/L    Chloride 100 98 - 107 mmol/L    CO2 27.0 20.0 - 31.0 mmol/L    Anion Gap 11 5 - 14 mmol/L    BUN 19 9 - 23 mg/dL    Creatinine 1.61 0.96 - 1.02 mg/dL    BUN/Creatinine Ratio 20     eGFR CKD-EPI (2021) Female 71 >=60 mL/min/1.32m2    Glucose 233 (H) 70 - 179 mg/dL    Calcium 9.4 8.7 - 04.5 mg/dL    Albumin 3.1 (L) 3.4 - 5.0 g/dL    Total Protein 6.7 5.7 - 8.2 g/dL    Total Bilirubin 2.1 (H) 0.3 - 1.2 mg/dL    AST 62 (H) <=40 U/L    ALT 48 10 - 49 U/L    Alkaline Phosphatase 102 46 - 116 U/L   PT-INR    Collection Time: 07/14/23  7:46 AM   Result Value Ref Range    PT 13.8 (H) 9.9 - 12.6 sec    INR 1.21    AFP tumor marker  Collection Time: 07/14/23  7:46 AM   Result Value Ref Range    AFP-Tumor Marker 5 <=8 ng/mL   Type and Screen with Confirmation ABORh    Collection Time: 07/14/23  7:46 AM   Result Value Ref Range    ABO Grouping A POS     Antibody Screen NEG    CBC w/ Differential    Collection Time: 07/14/23  7:46 AM   Result Value Ref Range    WBC 5.5 3.6 - 11.2 10*9/L    RBC 3.88 (L) 3.95 - 5.13 10*12/L    HGB 11.2 (L) 11.3 - 14.9 g/dL    HCT 69.6 (L) 29.5 - 44.0 %    MCV 85.0 77.6 - 95.7 fL    MCH 28.9 25.9 - 32.4 pg    MCHC 34.0 32.0 - 36.0 g/dL    RDW 28.4 13.2 - 44.0 %    MPV 10.4 6.8 - 10.7 fL    Platelet 57 (L) 150 - 450 10*9/L    Neutrophils % 65.8 %    Lymphocytes % 20.6 %    Monocytes % 10.3 %    Eosinophils % 2.5 %    Basophils % 0.8 %    Absolute Neutrophils 3.6 1.8 - 7.8 10*9/L    Absolute Lymphocytes 1.1 1.1 - 3.6 10*9/L    Absolute Monocytes 0.6 0.3 - 0.8 10*9/L    Absolute Eosinophils 0.1 0.0 - 0.5 10*9/L Absolute Basophils 0.0 0.0 - 0.1 10*9/L       Imaging: None

## 2023-07-14 NOTE — Unmapped (Signed)
[facility] Outpatient Nutrition Services   Medical Nutrition Therapy Consultation       Visit Type:    Initial Assessment    Referral Reason:   Liver Transplant Evaluation    Denise York is a 58 y.o. female seen for medical nutrition therapy. Her active problem list, medication list, and allergies were reviewed.     Her interim medical history is significant for decompensated MASH cirrhosis.  Her cirrhosis has been complicated by clinically significant portal hypertension and hepatic encephalopathy, and she recently developed ascites.    Anthropometrics   Estimated body mass index is 34.76 kg/m?? as calculated from the following:    Height as of 07/15/23: 163.8 cm (5' 4.49).    Weight as of 07/15/23: 93.3 kg (205 lb 9.6 oz).    Wt Readings from Last 5 Encounters:   07/15/23 93.3 kg (205 lb 9.6 oz)   07/14/23 91.6 kg (201 lb 14.4 oz)   07/14/23 91.6 kg (201 lb 14.4 oz)   07/14/23 91.6 kg (201 lb 14.4 oz)   07/12/23 91 kg (200 lb 9.9 oz)     Usual body weight: 240 lbs prior to starting Ozempic per patient, so far she has lost down to 200- 205 lbs without fluid   Ideal Body Weight:   56.8 kg        Nutrition Risk Screening:   Food Insecurity: No Food Insecurity (07/15/2023)    Hunger Vital Sign     Worried About Running Out of Food in the Last Year: Never true     Ran Out of Food in the Last Year: Never true        Nutrition Focused Physical Exam:  Fat Areas Examined  Orbital: No loss  Upper Arm: Mild loss      Muscle Areas Examined  Temple: No loss  Clavicle: No loss  Acromion: No loss  Scapular: No loss  Dorsal Hand: No loss       Functional Status  Hand Grip Strength: Measurably Reduced          [facility] Frailty Index    Gender: Female     Dominant Hand Grip Strength:   Attempt 1: 18.0   Attempt 2: 11.7   Attempt 3: 10.2   Average: 13.30    Time to Do 5 Chair stands:  12.47  Secs      Seconds Holding 3 positions:   Side by Side: 10   SemiTandem: 10   Tandem: 10    [email or web address]    This patient's frailty index score is  4.23 which puts the patient  in the 72nd  percentile for frailty among patients with cirrhosis who are listed for transplant.  Patient is considered PRE-FRAIL at this time.       Malnutrition Screening:   Patient does not meet AND/ASPEN criteria for malnutrition at this time (07/15/23 2004)      Biochemical Data, Medical Tests and Procedures:  All pertinent labs and imaging reviewed by Idolina Primer, RD/LDN at 9:54 AM 07/15/2023.    Patient reports checking her blood glucose while on Ozempic. She reports her sugars usually run between 60- 100. She reports knowing how to correct a low blood sugar and she eats something with sugar and it corrects her levels.     Lab Results   Component Value Date    A1C 5.2 06/01/2023    A1C 7.7 (H) 09/26/2022    A1C 10.3 (H) 08/14/2022    GLU 233 (H) 07/14/2023  No results found for: VITAMINA  Lab Results   Component Value Date    Vitamin D Total (25OH) 52.8 09/26/2022     No results found for: [facility]  Lab Results   Component Value Date    Zinc 45 (L) 06/27/2023     No results found for: COPPER      Lab Results   Component Value Date    NA 138 07/14/2023    K 4.3 07/14/2023    CL 100 07/14/2023    CO2 27.0 07/14/2023       Lab Results   Component Value Date    CRP <4.0 05/09/2022    ESR 13 05/09/2022    BUN 19 07/14/2023    CREATININE 0.94 07/14/2023       Lab Results   Component Value Date    CHOL 125 05/09/2022    HDL 49 05/09/2022    LDL 65 05/09/2022    TRIG 56 05/09/2022       Medications and Vitamin/Mineral Supplementation:   All nutritionally pertinent medications reviewed on 07/14/2023  Nutritionally pertinent medications include: lasix, lactulose, metformin, omeprazole, psyllium seed, rifaximin, Ozempic (patient reports running out 3 weeks ago and that she is in communication with her pharmacy to get more, she has been on it for ~1 year in total), spironolactone   She is taking nutrition supplements. Multivitamin, magnesium oxide, Vitamin D3    Current Outpatient Medications   Medication Sig Dispense Refill    atorvastatin (LIPITOR) 20 MG tablet Take 1 tablet (20 mg total) by mouth daily. 90 tablet 3    blood sugar diagnostic (GLUCOSE BLOOD) Strp Use to test blood sugar daily 100 strip 3    blood-glucose meter kit Use to test blood sugar DAILY 1 each 1    carvedilol (COREG) 6.25 MG tablet Take 1 tablet (6.25 mg total) by mouth two (2) times a day. Please add to pill pack 60 tablet 11    cholecalciferol, vitamin D3, 2,000 unit cap Take 2 capsules (4,000 Units total) by mouth daily. 1 each 0    DAILY-VITE, WITH FOLIC ACID, 400 mcg Tab tablet Take 1 tablet by mouth daily.      escitalopram oxalate (LEXAPRO) 10 MG tablet Take 1 tablet (10 mg total) by mouth daily. 90 tablet 3    furosemide (LASIX) 80 MG tablet Take 1 tablet (80 mg total) by mouth daily. Please add to pill packs 90 tablet 0    inhalational spacing device (AEROCHAMBER MV) Spcr Use as directed with inhalers 1 each 0    lactulose (CEPHULAC) 20 gram packet Take 1 packet (20 g total) by mouth Three (3) times a day.      lancets Misc Use to test blood sugar daily 100 each 3    loratadine (CLARITIN) 10 mg tablet Take 1 tablet (10 mg total) by mouth daily. 90 tablet 1    magnesium oxide (MAG-OX) 400 mg (241.3 mg elemental magnesium) tablet Take 1 tablet (400 mg total) by mouth daily. 90 tablet 3    metFORMIN (GLUCOPHAGE-XR) 500 MG 24 hr tablet Take 1 tablet (500 mg total) by mouth daily with evening meal. 90 tablet 3    multivitamin (TAB-A-VITE/THERAGRAN) per tablet Take 1 tablet by mouth daily. 90 tablet 3    naltrexone (DEPADE) 50 mg tablet Take 1 tablet (50 mg total) by mouth daily. Please add to pill packs 30 tablet 11    nitrofurantoin, macrocrystal-monohydrate, (MACROBID) 100 MG capsule Take 1 capsule (100 mg total) by  mouth two (2) times a day for 5 days. (Patient not taking: Reported on 07/15/2023) 10 capsule 0    omeprazole (PRILOSEC) 20 MG capsule Take 1 capsule (20 mg total) by mouth Two (2) times a day (30 minutes before a meal). 180 capsule 3    psyllium seed, with sugar, (FIBER ORAL) Take 4 capsules by mouth daily. [employer]Js brand.      rifAXIMin (XIFAXAN) 550 mg Tab Take 1 tablet (550 mg total) by mouth two (2) times a day. 180 tablet 3    semaglutide (OZEMPIC) 2 mg/dose (8 mg/3 mL) PnIj Inject 2 mg under the skin every seven (7) days. (Patient not taking: Reported on 07/15/2023) 9 mL 3    spironolactone (ALDACTONE) 50 MG tablet Take 1 tablet (50 mg total) by mouth daily. 90 tablet 3     No current facility-administered medications for this visit.       Nutrition History:     Dietary Restrictions: She reported food allergies to shellfish containing products.    Gastrointestinal Issues: Constipation 1-2 stools per day with lactulose. Patient reports needing 5 doses of lactulose to have that third BM.     Hunger and Satiety: Poor appetite and limited intake.   Early satiety. Patient with poor appetite even off of Ozempic for 3 weeks     Food Safety and Access: No to little issues noted.     Diet Recall:   Time Intake   Breakfast Lactulose + powerade zero   Sometimes has breakfast, most of the time does not - maybe toast     Snack (AM) Boost High Protein   Lunch Soup    Snack (PM)    Psychologist, occupational or pork chop + starch + green beans or corn    Snack (HS)      Food-Related History:  Beverages:  water all day, 1 diet pepsi daily (sometimes less)   Dining Out:  minimal per patient      Physical Activity:  Physical activity level is sedentary with little to no exercise.   Falls: none   DME: none   Orthopedic surgery history: achilles tendon surgery on right knee, this was done within past 10 years   Patient with some back pain. Patient reports being told by MD to stay off her feet as much as possible to help with edema in feet since surgery in November.   Patient with shakiness in feet and hands, which makes it difficult to exercise    Daily Estimated Nutritional Needs:  Energy: 1705- 1990 kcals [30-35 kcal/kg using ideal body weight, 56.8 kg (07/15/23 2058)]  Protein: 70- 85 gm [1.2-1.5 gm/kg using ideal body weight, 56.8 kg (07/15/23 2058)]    Fluid:   [per MD team]  Sodium:  <2000 mg     Nutrition Goals & Evaluation    1. Meet estimated daily needs  (New)  2. Hemoglobin A1c <7% -  (New)  3. Preliminary understanding of post-transplant diet -  (New)  4. Prevention/improvment of malnutrition -  (New)    Nutrition goals reviewed, relevant barriers identified and addressed: knowledge deficit . She is evaluated to have good willingness and ability to achieve nutrition goals.     Nutrition Assessment     Per the patient's diet recall, nutrition intake is limited and does not meet estimated nutrition needs. Patient's intake of several small meals daily is appropriate for liver disease and transplant. Discussed importance of increasing protein in diet to improve labs and overall  nutrition. Patient does not meet malnutrition criteria at this time. Per frailty score, patient is considered pre-frail. Discussed physical therapy referral and how adequate nutrition can be beneficial for patient's who score frail. Provided patient and family with link to the [facility] website for patient to complete exercise videos at home per her preference.     Given patient's BMI of 34.76 team recommends weight loss prior to transplant. Anticipate further weight loss with Ozempic and decreased intake.     Transplant Nutrition Assessment   The patient has the following nutrition concerns for transplant listing: BMI greater than 32 and pre-frailty. These is/are not an absolute contraindication for transplant listing based on the Liver Recipient Referral, Evaluation and Selection policy but will be discussed with the team during the selection conference.    Nutrition Intervention    Nutrition Education: transplant goals and expectations, cirrhosis medical nutrition therapy     Transplant Specific Education  Nutritionally relevant side effects of anti-rejection medications reviewed, such as risk for diabetes.  Also briefly reviewed post-transplant dietary changes to ensure pt had all information available to them to make an educated decision.    Materials Provided were: Handout explaining prescribed diet     Nutrition Plan:   Adhere to low sodium (<2000 milligram) and high protein (~80 gram) diet   Eat 4-5 small protein containing meals per day   Continue 1 protein shake per day, aim for shake with 30 grams of protein per container  Space meals out every 3-4 hours while awake  Consume a night time snack   Increase exercise to 30 minutes, 5 days per week       Follow up will occur in 3-6 months while being considered for transplant listing.     Food/Nutrition-related history, Anthropometric measurements, and Biochemical data, medical tests, procedures will be assessed at time of follow-up.     Recommendations for Care Team:  Monitor functional status, if noted decline can schedule patient for formal frailty re-assessment     Patient seen according to established protocol for transplant care.    Time spent 60 minutes   I am located on-site and the patient is located on-site for this visit.       Lanelle Bal, RD, LDN, CCTD  Abdominal Transplant Dietitian   Pager: [phone]

## 2023-07-14 NOTE — Unmapped (Signed)
Please see patient surgery visit for documentation.

## 2023-07-14 NOTE — Unmapped (Signed)
[facility]  [facility]  Weldon Picking, MD   [facility]  [address]  [place], [place] [address]  Main Clinic: [phone]  Appointment Schedulers: [phone]  Iver Nestle, RN: [phone]  Fax: [phone]       Thank you for allowing me to participate in your medical care today. Here are my recommendations based on today's visit:    Adjust lactulose dose or frequency to produce a target of 3-4 soft bowel movements a day  We will continue the transplant evaluation and keep you updated on the next steps    Take care,  Weldon Picking, MD

## 2023-07-15 ENCOUNTER — Ambulatory Visit: Admit: 2023-07-15 | Discharge: 2023-07-16 | Payer: PRIVATE HEALTH INSURANCE

## 2023-07-15 DIAGNOSIS — K7581 Nonalcoholic steatohepatitis (NASH): Principal | ICD-10-CM

## 2023-07-15 DIAGNOSIS — K7460 Unspecified cirrhosis of liver: Principal | ICD-10-CM

## 2023-07-15 DIAGNOSIS — E119 Type 2 diabetes mellitus without complications: Principal | ICD-10-CM

## 2023-07-15 DIAGNOSIS — R188 Other ascites: Principal | ICD-10-CM

## 2023-07-15 DIAGNOSIS — Z01818 Encounter for other preprocedural examination: Principal | ICD-10-CM

## 2023-07-15 DIAGNOSIS — K7682 Hepatic encephalopathy (CMS-HCC): Principal | ICD-10-CM

## 2023-07-15 DIAGNOSIS — N3001 Acute cystitis with hematuria: Principal | ICD-10-CM

## 2023-07-15 DIAGNOSIS — N390 Urinary tract infection, site not specified: Principal | ICD-10-CM

## 2023-07-15 LAB — CMV IGG: CMV IGG: NEGATIVE

## 2023-07-15 LAB — HEPATITIS A IGG: HEPATITIS A IGG: NONREACTIVE

## 2023-07-15 MED ORDER — NITROFURANTOIN MONOHYDRATE/MACROCRYSTALS 100 MG CAPSULE
ORAL_CAPSULE | Freq: Two times a day (BID) | ORAL | 0 refills | 5.00 days | Status: CP
Start: 2023-07-15 — End: 2023-07-20

## 2023-07-15 NOTE — Unmapped (Signed)
Spoke with patient via telephone about urine culture results from her inpatient stay on our team. She was admitted for hepatic encephalopathy which resolved with lactulose. She did not receive antibiotics while in the hospital but was reporting some abdominal pain at that time. No flank pain or fevers. Discussed she likely has uncomplicated UTI and would benefit from short course of antibiotics which she is agreeable to. Due to PCN allergy will send macrobid which the organism is susceptible to.     All questions answered.    Hewitt Shorts, MD MPH Family Medicine

## 2023-07-15 NOTE — Unmapped (Signed)
Prior authorization paperwork for Ozempic 2 mg dose (8mg /43ml) submitted to [employer] via covermymeds (key:  [id number]).  Response unknown.

## 2023-07-15 NOTE — Unmapped (Signed)
Received notification that they need OV note documenting A1c greater than 6.5. Will have Dr. Cathey Endow complete during visit today and will fax once note is signed.

## 2023-07-15 NOTE — Unmapped (Signed)
Chief Complaint:  Chief Complaint   Patient presents with    Fatigue     And confusion, hospital follow up for hepatic encephalopathy, here with step daughter    Urinary Tract Infection     Just had antibiotics called in, hasn't started them yet    Constipation    discuss ozempic     Needs OV note to state hx of A1c greater than 6.5       This patients last WCC/CPE date: : 06/27/2023  This patient's last AWV date: [facility] Last Medicare Wellness Visit Date: Not Found    Pt was seen in concert with MSY3 Irwin Brakeman who assisted with much of the notetaking.  I attest that I have reviewed and updated the note. The components of the history of present illness, the physical exam, and the assessment and plan documented were performed by me or were performed in my presence by the student and verified by me.    History of Present Illness:  Denise York is a 58 y.o. female with past medical history as below presents today for hospital follow-up.    She is doing just ok today. She was recently admitted for hepatic encephalopathy 07/12/23 and discharged 07/13/23. Ammonia level elevated in hospital in the setting of missing some doses of lactulose. Lactulose was given regularly in the hospital with improvement in mental status and lethargy. Since discharge, she has been trying to take lactulose as prescribed but has not had as frequent of bowel movements as has been recommended. She says she is feeling constipated. Obstacles to taking lactulose regularly for patient have included sleeping for many hours during the day and family not waking her for her to take it, being worried she might have a bowel accident if she takes lactulose regularly and then needs to leave the house and go somewhere. . She had a bowel movement without  any warning while in the office today. WE discussed her and her family putting a system in place to help her remember to take the lactulose in a scheduled manner and to record when she stools and how much fluids and sodium or protein she takes in.     She is also interested in working out some issues with accessing her ozempic. She has a history of diabetes with A1c up to 10.3 on 08/14/22, which then improved down to 7.7 with metformin and lifestyle changes. Adding ozempic, helped lower her HgbA1c down even further to 5.2 on 06/01/23 while continuing metformin and lifestyle changes. She has had trouble getting hold of this medication due to prior auth issues in the last  month. Also, sometimes taking the ozempic has caused some slow gut symptoms and there has been some concern that it could contribute to worsening constipation. Other than this, she has no issues taking or accessing her medications.    Got a call from hopital today that urine culture c/with uncomplicated UTI and macrobit recommended as antibiotic. Patient has not yet started this medication    Patient met with several specialists yesterday regarding the possibility of getting an autologous liver transplant for Ms. Trudel as soon as possible. I did write them today to confirm their agreement with the macrobid for URI, Holding ozempic for a week whild we get lactulose titrated up and then start ozempic to help with weight loos to help with surgery. In addition, patient planns to work on eating higher protein and lower sodium levels.       Patient  Care Team:  Cathey Endow Heywood Footman, MD as PCP - General (Family Medicine)  Cathey Endow, Heywood Footman, MD as PCP - Rosann Auerbach, Betsey Holiday, PA as Physician Assistant (Gastroenterology)  Jacquelyne Balint, MD as Consulting Physician (Cardiovascular Disease)  Fix, Alfonso Ellis, MD as Evaluating Hepatologist (Gastroenterology)  Jobie Quaker, RN as Transplant Coordinator (Transplant)  Smiley Houseman as Case Manager/Social Worker (Transplant)  Elease Etienne as Transplant Financial Coordinator (Transplant)  Janos Shampine, Heywood Footman, MD as Referring Physician (Family Medicine)  Andee Poles, PsyD as Transplant Psychologist (Transplant Surgery)    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    Obesity     Prediabetes      Patient Active Problem List   Diagnosis    Insomnia    Vitamin deficiency    Dysthymic disorder    Restless leg syndrome    Urinary frequency    Incomplete bladder emptying    Nonspecific abnormal results of liver function study    Edema    Vitamin D deficiency    Itch    Fatty liver disease, nonalcoholic    Constipation    Depression    Neoplasm of right breast, 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)    Type 2 diabetes mellitus without complication (CMS-HCC)    Bowel incontinence    Mixed incontinence urge and stress    Encounter for long-term current use of medication    Obesity    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    Back pain    Neoplasm of uncertain behavior    Other cirrhosis of liver (CMS-HCC)    Portal hypertension (CMS-HCC)    Hepatic encephalopathy (CMS-HCC)    Thrombocytopenia (CMS-HCC)    Melena    Lower extremity edema    ASD (atrial septal defect) - s/p repair    Nausea and vomiting    Nonobstructive atherosclerosis of coronary artery    Other ascites    Pruritus    Secondary esophageal varices without bleeding (CMS-HCC)    Metabolic dysfunction-associated steatohepatitis (MASH)    Intention tremor     OB History       Gravida   2    Para   2    Term   2    Preterm        AB        Living   0         SAB        IAB        Ectopic        Molar        Multiple        Live Births   2              Past Surgical History:   Procedure Laterality Date    ABDOMINAL WALL MESH  REMOVAL  1997, 2005    Placement, 1997, removal and replacement 2005    BLADDER SURGERY bladder tact X2    BREAST BIOPSY Right 04/2013    benign    BREAST EXCISIONAL  BIOPSY Right 05/2013    benign    BREAST SURGERY Right     precancerous spot removed    CHOLECYSTECTOMY      HYSTERECTOMY      IR EMBOLIZATION VENOUS OTHER THAN HEMORRHAGE  02/17/2023    IR EMBOLIZATION VENOUS OTHER THAN HEMORRHAGE 02/17/2023 Ammie Dalton, MD IMG VIR [facility]    PR COLSC FLX W/RMVL OF TUMOR POLYP LESION SNARE TQ N/A 10/14/2016    Procedure: COLONOSCOPY FLEX; W/REMOV TUMOR/LES BY SNARE;  Surgeon: Alfred Levins, MD;  Location: [facility]EDURES [place];  Service: Gastroenterology    PR COLSC FLX W/RMVL OF TUMOR POLYP LESION SNARE TQ N/A 01/15/2023    Procedure: COLONOSCOPY FLEX; W/REMOV TUMOR/LES BY SNARE;  Surgeon: Mickie Hillier, MD;  Location: [facility];  Service: Gastroenterology    PR PERC CLOS,CONG INTERATRIAL COMMUN W/IMPL N/A 09/12/2022    Procedure: ASD closure;  Surgeon: Jacquelyne Balint, MD;  Location: [facility] C[facility];  Service: Cardiology    PR UPPER GI ENDOSCOPY,BIOPSY N/A 01/15/2023    Procedure: UGI ENDOSCOPY; WITH BIOPSY, SINGLE OR MULTIPLE;  Surgeon: Mickie Hillier, MD;  [facility]: [facility];  Service: Gastroenterology    SKIN BIOPSY         Allergies:  Penicillins, Shellfish containing products, Glipizide, and Lisinopril    Current Outpatient Medications   Medication Sig Dispense Refill    atorvastatin (LIPITOR) 20 MG tablet Take 1 tablet (20 mg total) by mouth daily. 90 tablet 3    blood sugar diagnostic (GLUCOSE BLOOD) Strp Use to test blood sugar daily 100 strip 3    blood-glucose meter kit Use to test blood sugar DAILY 1 each 1    carvedilol (COREG) 6.25 MG tablet Take 1 tablet (6.25 mg total) by mouth two (2) times a day. Please add to pill pack 60 tablet 11    cholecalciferol, vitamin D3, 2,000 unit cap Take 2 capsules (4,000 Units total) by mouth daily. 1 each 0    DAILY-VITE, WITH FOLIC ACID, 400 mcg Tab tablet Take 1 tablet by mouth daily.      escitalopram oxalate (LEXAPRO) 10 MG tablet Take 1 tablet (10 mg total) by mouth daily. 90 tablet 3    furosemide (LASIX) 80 MG tablet Take 1 tablet (80 mg total) by mouth daily. Please add to pill packs 90 tablet 0    inhalational spacing device (AEROCHAMBER MV) Spcr Use as directed with inhalers 1 each 0    lactulose (CEPHULAC) 20 gram packet Take 1 packet (20 g total) by mouth Three (3) times a day.      lancets Misc Use to test blood sugar daily 100 each 3    loratadine (CLARITIN) 10 mg tablet Take 1 tablet (10 mg total) by mouth daily. 90 tablet 1    magnesium oxide (MAG-OX) 400 mg (241.3 mg elemental magnesium) tablet Take 1 tablet (400 mg total) by mouth daily. 90 tablet 3    metFORMIN (GLUCOPHAGE-XR) 500 MG 24 hr tablet Take 1 tablet (500 mg total) by mouth daily with evening meal. 90 tablet 3    multivitamin (TAB-A-VITE/THERAGRAN) per tablet Take 1 tablet by mouth daily. 90 tablet 3    naltrexone (DEPADE) 50 mg tablet Take 1 tablet (50 mg total) by mouth daily. Please add to pill packs 30 tablet 11    omeprazole (PRILOSEC) 20 MG capsule Take 1 capsule (20 mg total) by mouth Two (2) times a day (30 minutes before a meal). 180 capsule 3  psyllium seed, with sugar, (FIBER ORAL) Take 4 capsules by mouth daily. BJs brand.      rifAXIMin (XIFAXAN) 550 mg Tab Take 1 tablet (550 mg total) by mouth two (2) times a day. 180 tablet 3    spironolactone (ALDACTONE) 50 MG tablet Take 1 tablet (50 mg total) by mouth daily. 90 tablet 3    nitrofurantoin, macrocrystal-monohydrate, (MACROBID) 100 MG capsule Take 1 capsule (100 mg total) by mouth two (2) times a day for 5 days. (Patient not taking: Reported on 07/15/2023) 10 capsule 0    semaglutide (OZEMPIC) 2 mg/dose (8 mg/3 mL) PnIj Inject 2 mg under the skin every seven (7) days. (Patient not taking: Reported on 07/15/2023) 9 mL 3     No current facility-administered medications for this visit.     Social History     Socioeconomic History    Marital status: Married     Spouse name: None Number of children: None    Years of education: None    Highest education level: None   Tobacco Use    Smoking status: Never     Passive exposure: Past    Smokeless tobacco: Never   Vaping Use    Vaping status: Never Used   Substance and Sexual Activity    Alcohol use: No    Drug use: No    Sexual activity: Not Currently     Birth control/protection: Abstinence, None   Other Topics Concern    Do you use sunscreen? Yes    Excessive sun exposure? Yes   Social History Narrative    2 births. No living children. Married new husband 2014.         02/21/2017        [facility] Components:        Family, social, cultural characteristics: social support includes best friend .      Patient has the following communication needs: none, per patient     Health Literacy: How confident are you that you understand your health issues/concerns, can participate in your care, and manage your care along with your physician: confident.    Behaviors Affecting Health: none, per patient    Family history of mental health illness and/or substance abuse: asked patient/parent and none disclosed.    Have you been seen by any medical provider that we have not referred you to since your last visit ? No    Discussed a Living Will with the patient and VOZ:DGUYQIH is under the age of 60.      Social Drivers of Health     Food Insecurity: No Food Insecurity (07/15/2023)    Hunger Vital Sign     Worried About Running Out of Food in the Last Year: Never true     Ran Out of Food in the Last Year: Never true   Transportation Needs: No Transportation Needs (07/15/2023)    PRAPARE - Therapist, art (Medical): No     Lack of Transportation (Non-Medical): No     Family History   Problem Relation Age of Onset    Heart disease Mother     Hypertension Mother     COPD Mother     Heart disease Father     Kidney disease Father     Allergies Father     Gout Father     Diabetes Father     Allergies Other         FAMILY H/O    Coronary artery  disease Other         FAMILY H/O    Kidney failure Other         FAMILY H/O    No Known Problems Sister     No Known Problems Daughter     No Known Problems Maternal Grandmother     No Known Problems Maternal Grandfather     No Known Problems Paternal Grandmother     No Known Problems Paternal Grandfather     Alcohol abuse Maternal Uncle     Birth defects Son     Birth defects Son     BRCA 1/2 Neg Hx     Breast cancer Neg Hx     Cancer Neg Hx     Colon cancer Neg Hx     Endometrial cancer Neg Hx     Ovarian cancer Neg Hx     Substance Abuse Disorder Neg Hx     Mental illness Neg Hx      Immunization History   Administered Date(s) Administered    COVID-19 VAC,MRNA,TRIS(12Y UP)(PFIZER)(GRAY CAP) 08/19/2020    COVID-19 VACC,MRNA,(PFIZER)(PF) 10/02/2019, 10/23/2019    Covid-19 Vac, (33yr+) (Comirnaty) Mrna Pfizer  04/08/2022, 07/14/2023    HEPATITIS B VACCINE ADULT, ADJUVANTED, IM(HEPLISAV B) 06/17/2022, 09/26/2022    Hepatitis A (Adult) 06/17/2022, 09/26/2022    INFLUENZA INJ MDCK PF, QUAD,(FLUCELVAX)(58MO AND UP EGG FREE) 05/15/2021    INFLUENZA TIV (TRI) PF (IM)(HISTORICAL) 04/09/2011    INFLUENZA VACCINE IIV3(IM)(PF)6 MOS UP 06/12/2023    Influenza TRI (IIV3) 5+yrs MDV 06/04/2013    Influenza Vaccine Quad(IM)6 MO-Adult(PF) 05/08/2017, 04/09/2018, 03/11/2019    Influenza Virus Vaccine, unspecified formulation 04/08/2015, 04/07/2016, 05/04/2020, 04/08/2022    PNEUMOCOCCAL POLYSACCHARIDE 23-VALENT 07/19/2014    Pneumococcal Conjugate 20-valent 06/17/2022    SHINGRIX-ZOSTER VACCINE (HZV),RECOMBINANT,ADJUVANTED(IM) 11/09/2020, 05/15/2021    TdaP 07/16/2011, 06/17/2022       Health Maintenance   Topic Date Due    Retinal Eye Exam  10/02/2023    Mammogram  10/28/2023    Hemoglobin A1c  11/29/2023    Foot Exam  06/26/2024    Urine Albumin/Creatinine Ratio  06/26/2024    Serum Creatinine Monitoring  07/13/2024    Potassium Monitoring  07/13/2024    Colon Cancer Screening  01/15/2028    DTaP/Tdap/Td Vaccines (3 - Td or Tdap) 06/17/2032    Pneumococcal Vaccine 0-64  Completed    Hepatitis C Screen  Completed    COVID-19 Vaccine  Completed    Influenza Vaccine  Completed    Zoster Vaccines  Completed       I have reviewed and (if needed) updated the patient's problem list, medications, allergies, past medical and surgical history, social and family history     Review of Systems:  As per HPI     Physical Exam:  Vital Signs:  Vitals:    07/15/23 1106   BP: 104/60   Pulse: 73   Resp: 16   Temp: 36.6 ??C (97.9 ??F)   SpO2: 99%     Height: 163.8 cm (5' 4.49)    Body mass index is 34.76 kg/m??.  Wt Readings from Last 3 Encounters:   07/15/23 93.3 kg (205 lb 9.6 oz)   07/14/23 91.6 kg (201 lb 14.4 oz)   07/14/23 91.6 kg (201 lb 14.4 oz)     No LMP recorded. Patient has had a hysterectomy.    General: fATTIGUED APPEARING Well nourished, well developed, no acute distress. See BMI.  Eyes: Sclera and conjunctiva clear, no drainage.PERRL, EOMI  bilaterally.  Ears: External ears normal, Canals clear,  TMs with normal light reflex  Nose Nares, No sinus TTP  Mouth mucus membranes moist, posterior oropharynx without erythema or exudates. dentition .  Neck: Supple, normal ROM, no thyromegaly.  Lymph Nodes: No cervical or supraclavicular lymphadenopathy.  Skin: No rashes or obvious concerning lesions on exposed skin.  Cardiovascular: RRR, normal S1/S2, no murmurs, rubs or gallops. No chest wall TTP.  Lungs: CTA bilaterally, without crackles/wheezes/rhonchi, good air movement, no increased WOB  Abdomen:  Soft, non-distended, non-tender, no HSM or masses. Normal bowel sounds.  Extremities: Trace edema, pulses 2 +  PT DP - No fluid wave  Musculoskeletal: Normal tone UEs/LEs  Neurologic: CNs 2-12 grossly intact, balance normal. Gross motor/fine motor normal. Some  SLIGHT few beat asterixis noted in hands and feet. Had bowel movement without initial awareness in the exam room  Psychiatry: Pleasant affect, well kept, no abnormalities.    Imaging Studies:  No imaging ordered this visit.    Labs:  Recent Results (from the past 4 weeks)   POCT Glucose    Collection Time: 06/27/23 10:31 AM   Result Value Ref Range    Glucose, POC 135 65 - 179 mg/dL    Glucose Strip Lot Num [id number]     Glucose Strip Exp 11/03/2024     Serial Number     POCT Microalbumin/Creatinine Ratio,Urine    Collection Time: 06/27/23 10:40 AM   Result Value Ref Range    POC Albumin 80 mg/L    POC Creatinine (Urine POCT) 300 mg/dL    POC Albumin/Creat Ratio 30-300 (A) mg/g   PI Typing    Collection Time: 06/27/23 11:19 AM   Result Value Ref Range    PI Typing MM bands    ALPHA-1-ANTITRYPSIN 150 100 - 190 mg/dL   Zinc Level, Serum    Collection Time: 06/27/23 11:19 AM   Result Value Ref Range    Zinc 45 (L) 60 - 106 mcg/dL   AFP tumor marker    Collection Time: 06/27/23 11:19 AM   Result Value Ref Range    AFP-Tumor Marker 4 <=8 ng/mL   Comprehensive Metabolic Panel    Collection Time: 06/27/23 11:19 AM   Result Value Ref Range    Sodium 144 135 - 145 mmol/L    Potassium 4.3 3.4 - 4.8 mmol/L    Chloride 102 98 - 107 mmol/L    CO2 30.8 20.0 - 31.0 mmol/L    Anion Gap 11 5 - 14 mmol/L    BUN 16 9 - 23 mg/dL    Creatinine 4.09 8.11 - 1.02 mg/dL    BUN/Creatinine Ratio 17     eGFR CKD-EPI (2021) Female 70 >=60 mL/min/1.45m2    Glucose 141 (H) 70 - 99 mg/dL    Calcium 9.9 8.7 - 91.4 mg/dL    Albumin 3.0 (L) 3.4 - 5.0 g/dL    Total Protein 7.1 5.7 - 8.2 g/dL    Total Bilirubin 2.2 (H) 0.3 - 1.2 mg/dL    AST 57 (H) <=78 U/L    ALT 33 10 - 49 U/L    Alkaline Phosphatase 94 46 - 116 U/L   PT-INR    Collection Time: 06/27/23 11:19 AM   Result Value Ref Range    PT 15.2 (H) 9.9 - 12.6 sec    INR 1.33    CBC    Collection Time: 06/27/23 11:19 AM   Result Value Ref Range    WBC 4.5 3.6 -  11.2 10*9/L    RBC 3.82 (L) 3.95 - 5.13 10*12/L    HGB 11.2 (L) 11.3 - 14.9 g/dL    HCT 29.5 (L) 62.1 - 44.0 %    MCV 86.1 77.6 - 95.7 fL    MCH 29.3 25.9 - 32.4 pg    MCHC 34.0 32.0 - 36.0 g/dL    RDW 30.8 65.7 - 84.6 %    MPV 11.8 (H) 6.8 - 10.7 fL    Platelet 47 (L) 150 - 450 10*9/L   Morphology Review    Collection Time: 06/27/23 11:19 AM   Result Value Ref Range    Smear Review Comments See Comment (A) Undefined   Basic Metabolic Panel    Collection Time: 07/12/23  4:41 PM   Result Value Ref Range    Sodium 143 135 - 145 mmol/L    Potassium 4.3 3.4 - 4.8 mmol/L    Chloride 103 98 - 107 mmol/L    CO2 28.4 20.0 - 31.0 mmol/L    Anion Gap 12 5 - 14 mmol/L    BUN 24 (H) 9 - 23 mg/dL    Creatinine 9.62 9.52 - 1.02 mg/dL    BUN/Creatinine Ratio 26     eGFR CKD-EPI (2021) Female 73 >=60 mL/min/1.57m2    Glucose 154 70 - 179 mg/dL    Calcium 84.1 8.7 - 32.4 mg/dL   Hepatic Function Panel    Collection Time: 07/12/23  4:41 PM   Result Value Ref Range    Albumin 3.1 (L) 3.4 - 5.0 g/dL    Total Protein 7.1 5.7 - 8.2 g/dL    Total Bilirubin 1.5 (H) 0.3 - 1.2 mg/dL    Bilirubin, Direct 4.01 (H) 0.00 - 0.30 mg/dL    AST 60 (H) <=02 U/L    ALT 46 10 - 49 U/L    Alkaline Phosphatase 102 46 - 116 U/L   PT-INR    Collection Time: 07/12/23  4:41 PM   Result Value Ref Range    PT 15.1 (H) 9.9 - 12.6 sec    INR 1.32    Blood Gas Critical Care Panel, Venous    Collection Time: 07/12/23  4:41 PM   Result Value Ref Range    Specimen Source Venous     FIO2 Venous Room Air     pH, Venous 7.41 7.32 - 7.43    pCO2, Ven 48 40 - 60 mm Hg    pO2, Ven 28 (L) 35 - 40 mm Hg    HCO3, Ven 28 (H) 22 - 27 mmol/L    Base Excess, Ven 6.2 (H) -2.0 - 2.0    O2 Saturation, Venous 43.6 40.0 - 85.0 %    Sodium Whole Blood 142 135 - 145 mmol/L    Potassium, Bld 4.0 3.4 - 4.6 mmol/L    Calcium, Ionized Venous 5.26 4.40 - 5.40 mg/dL    Glucose Whole Blood 150 Undefined mg/dL    Lactate, Venous 2.0 (H) 0.5 - 1.8 mmol/L    Hgb, blood gas 13.10 12.00 - 16.00 g/dL   RAPID INFLUENZA/RSV/COVID PCR    Collection Time: 07/12/23  4:41 PM    Specimen: Nasopharyngeal Swab   Result Value Ref Range    SARS-CoV-2 PCR Negative Negative    Influenza A Negative Negative    Influenza B Negative Negative    RSV Negative Negative   hsTroponin I (single, no delta)    Collection Time: 07/12/23  4:41 PM   Result Value Ref Range  hsTroponin I <3 <=34 ng/L   Ammonia    Collection Time: 07/12/23  4:41 PM   Result Value Ref Range    Ammonia 101 (H) 11 - 32 umol/L   Ethanol    Collection Time: 07/12/23  4:41 PM   Result Value Ref Range    Alcohol, Ethyl <10 <=10 mg/dL   CBC w/ Differential    Collection Time: 07/12/23  4:41 PM   Result Value Ref Range    WBC 4.7 3.6 - 11.2 10*9/L    RBC 3.95 3.95 - 5.13 10*12/L    HGB 11.4 11.3 - 14.9 g/dL    HCT 16.1 (L) 09.6 - 44.0 %    MCV 85.3 77.6 - 95.7 fL    MCH 28.9 25.9 - 32.4 pg    MCHC 33.9 32.0 - 36.0 g/dL    RDW 04.5 (H) 40.9 - 15.2 %    MPV 10.3 6.8 - 10.7 fL    Platelet 57 (L) 150 - 450 10*9/L    nRBC 0 <=4 /100 WBCs    Neutrophils % 55.0 %    Lymphocytes % 30.9 %    Monocytes % 12.0 %    Eosinophils % 1.6 %    Basophils % 0.5 %    Absolute Neutrophils 2.6 1.8 - 7.8 10*9/L    Absolute Lymphocytes 1.4 1.1 - 3.6 10*9/L    Absolute Monocytes 0.6 0.3 - 0.8 10*9/L    Absolute Eosinophils 0.1 0.0 - 0.5 10*9/L    Absolute Basophils 0.0 0.0 - 0.1 10*9/L   Magnesium Level    Collection Time: 07/12/23  4:41 PM   Result Value Ref Range    Magnesium 1.9 1.6 - 2.6 mg/dL   Phosphorus Level    Collection Time: 07/12/23  4:41 PM   Result Value Ref Range    Phosphorus 3.2 2.4 - 5.1 mg/dL   ECG 12 Lead    Collection Time: 07/12/23  4:45 PM   Result Value Ref Range    EKG Systolic BP  mmHg    EKG Diastolic BP  mmHg    EKG Ventricular Rate 76 BPM    EKG Atrial Rate 76 BPM    EKG P-R Interval 164 ms    EKG QRS Duration 78 ms    EKG Q-T Interval 418 ms    EKG QTC Calculation 470 ms    EKG Calculated P Axis 54 degrees    EKG Calculated R Axis 6 degrees    EKG Calculated T Axis 23 degrees    QTC Fredericia 452 ms   Toxicology Screen, Urine    Collection Time: 07/13/23 12:47 AM   Result Value Ref Range    Amphetamines Screen, Ur Negative <500 ng/mL    Barbiturates Screen, Ur Negative <200 ng/mL    Benzodiazepines Screen, Urine Negative <200 ng/mL    Cannabinoids Screen, Ur Negative <20 ng/mL    Methadone Screen, Urine Negative <300 ng/mL    Cocaine(Metab.)Screen, Urine Negative <150 ng/mL    Opiates Screen, Ur Negative <300 ng/mL    Fentanyl Screen, Ur Negative <1.0 ng/mL    Oxycodone Screen, Ur Negative <100 ng/mL    Buprenorphine, Urine Negative <5 ng/mL   Urinalysis with Microscopy with Culture Reflex    Collection Time: 07/13/23 12:47 AM   Result Value Ref Range    Color, UA Yellow     Clarity, UA Cloudy     Specific Gravity, UA 1.034 (H) 1.003 - 1.030    pH, UA 5.5 5.0 -  9.0    Leukocyte Esterase, UA Small (A) Negative    Nitrite, UA Negative Negative    Protein, UA Trace (A) Negative    Glucose, UA Negative Negative    Ketones, UA Trace (A) Negative    Urobilinogen, UA 4.0 mg/dL (A) <3.0 mg/dL    Bilirubin, UA Negative Negative    Blood, UA Negative Negative    RBC, UA 3 <=4 /HPF    WBC, UA 18 (H) 0 - 5 /HPF    Squam Epithel, UA 8 (H) 0 - 5 /HPF    Bacteria, UA Many (A) None Seen /HPF    Mucus, UA Few (A) None Seen /HPF   Urine Culture    Collection Time: 07/13/23 12:47 AM    Specimen: Clean Catch; Urine   Result Value Ref Range    Urine Culture, Comprehensive >100,000 CFU/mL Escherichia coli (A)        Susceptibility    Escherichia coli - MIC SUSCEPTIBILITY RESULT     Amoxicillin + Clavulanate  Intermediate      Ampicillin  Resistant      Ampicillin + Sulbactam  Resistant      Cefazolin  Intermediate      Cephalexin*  Susceptible       * For uncomplicated UTI's only.     Ceftazidime  Susceptible      Ceftriaxone  Susceptible      Ciprofloxacin  Susceptible      Gentamicin  Susceptible      Levofloxacin  Susceptible      Nitrofurantoin  Susceptible      Piperacillin + Tazobactam  Susceptible      Tetracycline*  Susceptible       * Organisms that test susceptible to tetracycline are considered susceptible to doxycycline.However, some organisms that test intermediate or resistant to tetracycline may be susceptible to doxycycline.     Tobramycin  Susceptible      Trimethoprim + Sulfamethoxazole  Susceptible    Comprehensive metabolic panel    Collection Time: 07/13/23  5:43 AM   Result Value Ref Range    Sodium 143 135 - 145 mmol/L    Potassium 3.9 3.5 - 5.1 mmol/L    Chloride 103 98 - 107 mmol/L    CO2 28.2 20.0 - 31.0 mmol/L    Anion Gap 12 5 - 14 mmol/L    BUN 21 9 - 23 mg/dL    Creatinine 1.60 1.09 - 1.02 mg/dL    BUN/Creatinine Ratio 24     eGFR CKD-EPI (2021) Female 79 >=60 mL/min/1.38m2    Glucose 114 70 - 179 mg/dL    Calcium 9.5 8.7 - 32.3 mg/dL    Albumin 2.6 (L) 3.4 - 5.0 g/dL    Total Protein 6.0 5.7 - 8.2 g/dL    Total Bilirubin 1.4 (H) 0.3 - 1.2 mg/dL    AST 55 (H) <=55 U/L    ALT 43 10 - 49 U/L    Alkaline Phosphatase 86 46 - 116 U/L   CBC    Collection Time: 07/13/23  5:43 AM   Result Value Ref Range    WBC 3.8 3.6 - 11.2 10*9/L    RBC 3.48 (L) 3.95 - 5.13 10*12/L    HGB 10.0 (L) 11.3 - 14.9 g/dL    HCT 73.2 (L) 20.2 - 44.0 %    MCV 85.3 77.6 - 95.7 fL    MCH 28.9 25.9 - 32.4 pg    MCHC 33.9 32.0 - 36.0 g/dL  RDW 15.6 (H) 12.2 - 15.2 %    MPV 9.8 6.8 - 10.7 fL    Platelet 39 (L) 150 - 450 10*9/L   PT-INR    Collection Time: 07/13/23  5:43 AM   Result Value Ref Range    PT 16.5 (H) 9.9 - 12.6 sec    INR 1.45    POCT Glucose    Collection Time: 07/13/23  7:57 AM   Result Value Ref Range    Glucose, POC 108 70 - 179 mg/dL   POCT Glucose    Collection Time: 07/13/23 11:27 AM   Result Value Ref Range    Glucose, POC 240 (H) 70 - 179 mg/dL   Comprehensive Metabolic Panel    Collection Time: 07/14/23  7:46 AM   Result Value Ref Range    Sodium 138 135 - 145 mmol/L    Potassium 4.3 3.5 - 5.1 mmol/L    Chloride 100 98 - 107 mmol/L    CO2 27.0 20.0 - 31.0 mmol/L    Anion Gap 11 5 - 14 mmol/L    BUN 19 9 - 23 mg/dL    Creatinine 2.95 6.21 - 1.02 mg/dL    BUN/Creatinine Ratio 20     eGFR CKD-EPI (2021) Female 71 >=60 mL/min/1.21m2    Glucose 233 (H) 70 - 179 mg/dL    Calcium 9.4 8.7 - 30.8 mg/dL Albumin 3.1 (L) 3.4 - 5.0 g/dL    Total Protein 6.7 5.7 - 8.2 g/dL    Total Bilirubin 2.1 (H) 0.3 - 1.2 mg/dL    AST 62 (H) <=65 U/L    ALT 48 10 - 49 U/L    Alkaline Phosphatase 102 46 - 116 U/L   PT-INR    Collection Time: 07/14/23  7:46 AM   Result Value Ref Range    PT 13.8 (H) 9.9 - 12.6 sec    INR 1.21    AFP tumor marker    Collection Time: 07/14/23  7:46 AM   Result Value Ref Range    AFP-Tumor Marker 5 <=8 ng/mL   Type and Screen with Confirmation ABORh    Collection Time: 07/14/23  7:46 AM   Result Value Ref Range    ABO Grouping A POS     Antibody Screen NEG    Hepatitis A Antibody, IgM    Collection Time: 07/14/23  7:46 AM   Result Value Ref Range    Hep A IgM Nonreactive Nonreactive   Hepatitis B Surface Antigen    Collection Time: 07/14/23  7:46 AM   Result Value Ref Range    Hep B Surface Ag Nonreactive Nonreactive   Hepatitis B Surface Antibody    Collection Time: 07/14/23  7:46 AM   Result Value Ref Range    Hep B S Ab Reactive (A) Nonreactive, Grayzone    Hep B Surf Ab Quant 87.27 (H) <8.00 m(IU)/mL   Hepatitis B Core Antibody, total    Collection Time: 07/14/23  7:46 AM   Result Value Ref Range    Hep B Core Total Ab Nonreactive Nonreactive   Hepatitis C Antibody    Collection Time: 07/14/23  7:46 AM   Result Value Ref Range    Hepatitis C Ab Nonreactive Nonreactive   HIV Antigen/Antibody Combo    Collection Time: 07/14/23  7:46 AM   Result Value Ref Range    HIV Antigen/Antibody Combo Nonreactive Nonreactive   CMV IgG    Collection Time: 07/14/23  7:46 AM   Result  Value Ref Range    CMV IGG Negative Negative   CBC w/ Differential    Collection Time: 07/14/23  7:46 AM   Result Value Ref Range    WBC 5.5 3.6 - 11.2 10*9/L    RBC 3.88 (L) 3.95 - 5.13 10*12/L    HGB 11.2 (L) 11.3 - 14.9 g/dL    HCT 64.4 (L) 03.4 - 44.0 %    MCV 85.0 77.6 - 95.7 fL    MCH 28.9 25.9 - 32.4 pg    MCHC 34.0 32.0 - 36.0 g/dL    RDW 74.2 59.5 - 63.8 %    MPV 10.4 6.8 - 10.7 fL    Platelet 57 (L) 150 - 450 10*9/L Neutrophils % 65.8 %    Lymphocytes % 20.6 %    Monocytes % 10.3 %    Eosinophils % 2.5 %    Basophils % 0.8 %    Absolute Neutrophils 3.6 1.8 - 7.8 10*9/L    Absolute Lymphocytes 1.1 1.1 - 3.6 10*9/L    Absolute Monocytes 0.6 0.3 - 0.8 10*9/L    Absolute Eosinophils 0.1 0.0 - 0.5 10*9/L    Absolute Basophils 0.0 0.0 - 0.1 10*9/L   Hepatitis A IgG    Collection Time: 07/14/23  7:46 AM   Result Value Ref Range    Hep A IgG Nonreactive Nonreactive   Toxicology Screen, Urine    Collection Time: 07/14/23  1:51 PM   Result Value Ref Range    Amphetamines Screen, Ur Negative <500 ng/mL    Barbiturates Screen, Ur Negative <200 ng/mL    Benzodiazepines Screen, Urine Negative <200 ng/mL    Cannabinoids Screen, Ur Negative <20 ng/mL    Methadone Screen, Urine Negative <300 ng/mL    Cocaine(Metab.)Screen, Urine Negative <150 ng/mL    Opiates Screen, Ur Negative <300 ng/mL    Fentanyl Screen, Ur Negative <1.0 ng/mL    Oxycodone Screen, Ur Negative <100 ng/mL    Buprenorphine, Urine Negative <5 ng/mL     Other labwork reviewed today and appears to now appropriate tox screen     All recent labwork reviewed and essential components commented upon    I CORRESPONDED WITH DR. OREN FIX (HEPATOLOGY AND KAPOOR,SOORAH (HEPATOLOG) AND THEY AGREED IN TEMPORARILY HOLDING OXEMPIC WHILE WORKING ON GETTING AMMONIA DOWN AND UTI TREATED. tHEN REEVALUATE IN A COUPLE OF WEEK.     Assessment & Plan: Pathophysiology of condition(s) reviewed with patient if applicable. Pt stated understanding and there were no barriers to learning.  SEE PATIENT INSTRUCTIONS BELOW FOR DISCUSSION/PLAN     Diagnosis ICD-10-CM Associated Orders   1. 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 Hepatic encephalopathy (CMS-HCC)  K76.82       3. Ascites of liver  R18.8       4. Type 2 diabetes mellitus without complication, without long-term current use of insulin (CMS-HCC)  E11.9       5. Acute cystitis with hematuria  N30.01           Patient Instructions   SPECIFIC INSTRUCTIONS WE DISCUSSED TODAY:  Keep track of lactulose dosing and stooling - you should be taking enough to have at least 3-5 stools a day  I am checking in with your liver specialists about when to restart ozempic  Take antibiotics as planned  Limit sodium to 2000 mg daily and increase protein in diet as much as possible - ideas include  yogurt, eggs, chicken, fish, peanut butter (low sodium), protein shakes, protein bars.     Thank you for choosing [facility] for your care!    If you have a question about a recent visit or a plan that was discussed with your provider in the past: Please go to [email or web address] (or your phone app) and sign in to your [facility] Chart to send this information. I do my best to respond to messages within 3 business days, but it is not always possible to provide the most timely and complete information. For more urgent questions, CALLING THE OFFICE is always the best option.    If you are experiencing any new or worsening symptoms or you would like to talk about changing a medication or medical plan; it is best to schedule an appointment: Please go onto My [facility] Chart call our clinic at [phone] to schedule an appointment with your provider or the Acute Care Clinic or you can access the [facility] on My [facility] Chart.  If you cannot decide if your issue warrants a visit, you can always ask to discuss this with one of our clinical care team.    If you have urgent healthcare needs after normal business hours, on weekends, or during holidays:  We have an Acute Care Clinic available by appointment. Call [phone] to schedule this. We offer care for health issues that do not require the emergency room.   Monday - Thursday (8am to 7pm)  Friday (8am to 5pm)   Saturdays (9am to 1pm)   You can always reach the [facility] 24/7 Nursing Line after hours to get nurse advice. The nurse can consult with the doctor on call if indicated. Just call our main office number and follow the prompts - [phone]     You may receive a patient satisfaction survey by mail, text or email regarding your visit today. Your opinion is important to me. Your response benefits [place] all :-)    If you had or will have testing done, test results will be posted on [facility] or sent via letter.  A member of our Care Team will also contact you by message or phone if your results require follow-up.Please note that specialty test results take longer. Lab results may post before I review them. We will follow up as needed when I review them.    Lorel Monaco. Lizet Kelso, MD -   [facility]  [address], [place] [address]  Phone: [phone] Fax: [phone]  [email or web address]        Return for 12 noon on Jan 17th - follow up regarding how lactulose going. Video ok. Lorel Monaco.Cathey Endow, MD  Family Physician    [facility]  [address]  [place], [place] [address]  Ph [phone]  Fax [phone]

## 2023-07-16 LAB — OPIATE, URINE, QUANTITATIVE
6-MONOACETYLMRPH: <5 ng/mL (ref <5)
BUPRENORPHINE: <5 ng/mL (ref <5)
CODEINE GC/MS CONF: <25 ng/mL (ref <25)
FENTANYL, URINE GC/MS: <0.5 ng/mL (ref <0.5)
HYDROCODONE GC/MS CONF: <25 ng/mL (ref <25)
HYDROMORPHONE GC/MS CONF: <25 ng/mL (ref <25)
MORPHINE GC/MS CONF: <25 ng/mL (ref <25)
NORBUPRENORPHINE: <5 ng/mL (ref <5)
NORFENTANYL, UR GC/MS: <1 ng/mL (ref <1.0)
OPIATE INTERP: NEGATIVE
OXYCODONE (GC/MS): <25 ng/mL (ref <25)
OXYMORPHONE: <25 ng/mL (ref <25)

## 2023-07-16 NOTE — Unmapped (Signed)
SPECIFIC INSTRUCTIONS WE DISCUSSED TODAY:  Continue lactulose  Make appointments as recommended by hepatology.    Thank you for choosing [facility] for your care!    If you have a question about a recent visit or a plan that was discussed with your provider in the past: Please go to [email or web address] (or your phone app) and sign in to your [facility] Chart to send this information. I do my best to respond to messages within 3 business days, but it is not always possible to provide the most timely and complete information. For more urgent questions, CALLING THE OFFICE is always the best option.    If you are experiencing any new or worsening symptoms or you would like to talk about changing a medication or medical plan; it is best to schedule an appointment: Please go onto My [facility] Chart call our clinic at [phone] to schedule an appointment with your provider or the Acute Care Clinic or you can access the [facility] on My [facility] Chart.  If you cannot decide if your issue warrants a visit, you can always ask to discuss this with one of our clinical care team.    If you have urgent healthcare needs after normal business hours, on weekends, or during holidays:  We have an Acute Care Clinic available by appointment. Call [phone] to schedule this. We offer care for health issues that do not require the emergency room.   Monday - Thursday (8am to 7pm)  Friday (8am to 5pm)   Saturdays (9am to 1pm)   You can always reach the [facility] 24/7 Nursing Line after hours to get nurse advice. The nurse can consult with the doctor on call if indicated. Just call our main office number and follow the prompts - [phone]     You may receive a patient satisfaction survey by mail, text or email regarding your visit today. Your opinion is important to me. Your response benefits [place] all :-)    If you had or will have testing done, test results will be posted on [facility] or sent via letter.  A member of our Care Team will also contact you by message or phone if your results require follow-up.Please note that specialty test results take longer. Lab results may post before I review them. We will follow up as needed when I review them.    Lorel Monaco. Rykar Lebleu, MD -   [facility]  [address] [place], [place] [address]  Phone: [phone] Fax: [phone]  [email or web address]

## 2023-07-16 NOTE — Unmapped (Signed)
Insurance has been verified for 2025.       Patient has active coverage with [facility]), including prescription drug coverage via [employer] [phone]     Authorization is [id number] for Liver transplant evaluation valid 07/10/23 to 01/06/25      Patient is financially cleared for Liver transplant evaluation

## 2023-07-16 NOTE — Unmapped (Addendum)
VIDEO VISIT- This visit is conducted via Programmer, applications.    Pt is currently located in the [place] yes and is at home  PVP PERFORMED BY: Loralee Pacas, CMA (AAMA)  In case we get disconnected, patient's preferred phone number is [phone]  Pt informed that visit is billable and consented   Is there someone else in the room and if so, is it ok that they are there? Yes. What is your relationship? husband. Do you want this person here for the visit? yes.  Medications reviewed and updated today yes    Home Vitals if available n/a    I have identified myself to patient and shared my credentials.      The patient reports they are physically located in [place] and is currently: at home. I conducted a audio/video visit. I spent  45m 35s on the video call with the patient. I spent an additional 10 minutes on pre- and post-visit activities on the date of service .       Chief Complaint   Patient presents with    Fatigue    Altered Mental Status     Hepatic encephalopathy       Subjective:   Denise York is a 58 y.o. female who presents  for a video visit for follow-up for how things are going with her taking her lactulose to help with her hepatic encephalopathy.  It was at first difficult for [place] to get patient on the phone but then I reached her daughter who reached out to patient's husband who ended up getting patient on the video visit via the text link that I sent her via epic.  Patient states that today she is particularly tired because she got up a few times last night to have a bowel movement and was having a lot of abdominal cramping this morning and last night.  This kept her up.  The abdominal cramping is now gone.  She has not had much to eat or drink for the past few hours.  She does feel very tired right now although in general for the past few days she and her daughter both report that her concentration and mental status has been a bit improved.  Went through the medical record and reviewed the transplant recommendations with patient about going to the allergist and the urogynecologist to move forward with transplant process patient denied any new complaints..     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)    Type 2 diabetes mellitus without complication (CMS-HCC)    Bowel incontinence    Mixed incontinence urge and stress    Encounter for long-term current use of medication    Obesity    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    Back pain    Neoplasm of uncertain behavior    Other cirrhosis of liver (CMS-HCC)    Portal hypertension (CMS-HCC)  Hepatic encephalopathy (CMS-HCC)    Thrombocytopenia (CMS-HCC)    Melena    Lower extremity edema    ASD (atrial septal defect) - s/p repair    Nausea and vomiting    Nonobstructive atherosclerosis of coronary artery    Other ascites    Pruritus    Secondary esophageal varices without bleeding (CMS-HCC)    Metabolic dysfunction-associated steatohepatitis (MASH)    Intention tremor     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    Obesity     Prediabetes      Past Surgical History:   Procedure Laterality Date    ABDOMINAL WALL MESH  REMOVAL  1997, 2005    Placement, 1997, removal and replacement 2005    BLADDER SURGERY      bladder tact X2 BREAST BIOPSY Right 04/2013    benign    BREAST EXCISIONAL BIOPSY Right 05/2013    benign    BREAST SURGERY Right     precancerous spot removed    CHOLECYSTECTOMY      HYSTERECTOMY      IR EMBOLIZATION VENOUS OTHER THAN HEMORRHAGE  02/17/2023    IR EMBOLIZATION VENOUS OTHER THAN HEMORRHAGE 02/17/2023 Ammie Dalton, MD IMG [facility]IR [facility]    PR COLSC FLX W/RMVL OF TUMOR POLYP LESION SNARE TQ N/A 10/14/2016    Procedure: COLONOSCOPY FLEX; W/REMOV TUMOR/LES BY SNARE;  Surgeon: Alfred Levins, MD;  Location: [facility]ED[facility] [place];  Service: Gastroenterology    PR COLSC FLX W/RMVL OF TUMOR POLYP LESION SNARE TQ N/A 01/15/2023    Procedure: COLONOSCOPY FLEX; W/REMOV TUMOR/LES BY SNARE;  Surgeon: Mickie Hillier, MD;  [facility]: [facility];  Service: Gastroenterology    PR PERC CLOS,CONG INTERATRIAL COMMUN W/IMPL N/A 09/12/2022    Procedure: ASD closure;  Surgeon: Jacquelyne Balint, MD;  Location: [facility] CATH;  Service: Cardiology    PR UPPER GI ENDOSCOPY,BIOPSY N/A 01/15/2023    Procedure: UGI ENDOSCOPY; WITH BIOPSY, SINGLE OR MULTIPLE;  Surgeon: Mickie Hillier, MD;  [facility]: [facility];  Service: Gastroenterology    SKIN BIOPSY         Family History:     Family History   Problem Relation Age of Onset    Heart disease Mother     Hypertension Mother     COPD Mother     Heart disease Father     Kidney disease Father     Allergies Father     Gout Father     Diabetes Father     Allergies Other         FAMILY H/O    Coronary artery disease Other         FAMILY H/O    Kidney failure Other         FAMILY H/O    No Known Problems Sister     No Known Problems Daughter     No Known Problems Maternal Grandmother     No Known Problems Maternal Grandfather     No Known Problems Paternal Grandmother     No Known Problems Paternal Grandfather  Alcohol abuse Maternal Uncle     Birth defects Son     Birth defects Son     BRCA 1/2 Neg Hx     Breast cancer Neg Hx     Cancer Neg Hx     Colon cancer Neg Hx Endometrial cancer Neg Hx     Ovarian cancer Neg Hx     Substance Abuse Disorder Neg Hx     Mental illness Neg Hx        Social History:     Social History     Socioeconomic History    Marital status: Married   Tobacco Use    Smoking status: Never     Passive exposure: Past    Smokeless tobacco: Never   Vaping Use    Vaping status: Never Used   Substance and Sexual Activity    Alcohol use: No    Drug use: No    Sexual activity: Not Currently     Birth control/protection: Abstinence, None   Other Topics Concern    Do you use sunscreen? Yes    Excessive sun exposure? Yes   Social History Narrative    2 births. No living children. Married new husband 2014.         02/21/2017        PCMH Components:        Family, social, cultural characteristics: social support includes best friend .      Patient has the following communication needs: none, per patient     Health Literacy: How confident are you that you understand your health issues/concerns, can participate in your care, and manage your care along with your physician: confident.    Behaviors Affecting Health: none, per patient    Family history of mental health illness and/or substance abuse: asked patient/parent and none disclosed.    Have you been seen by any medical provider that we have not referred you to since your last visit ? No    Discussed a Living Will with the patient and EXB:MWUXLKG is under the age of 28.      Social Drivers of Health     Food Insecurity: No Food Insecurity (07/15/2023)    Hunger Vital Sign     Worried About Running Out of Food in the Last Year: Never true     Ran Out of Food in the Last Year: Never true   Transportation Needs: No Transportation Needs (07/15/2023)    PRAPARE - Therapist, art (Medical): No     Lack of Transportation (Non-Medical): No       Allergies:   Penicillins, Shellfish containing products, Glipizide, and Lisinopril    Current Medications:     Current Outpatient Medications   Medication Sig Dispense Refill    atorvastatin (LIPITOR) 20 MG tablet Take 1 tablet (20 mg total) by mouth daily. 90 tablet 3    blood sugar diagnostic (GLUCOSE BLOOD) Strp Use to test blood sugar daily 100 strip 3    blood-glucose meter kit Use to test blood sugar DAILY 1 each 1    carvedilol (COREG) 6.25 MG tablet Take 1 tablet (6.25 mg total) by mouth two (2) times a day. Please add to pill pack 60 tablet 11    cholecalciferol, vitamin D3, 2,000 unit cap Take 2 capsules (4,000 Units total) by mouth daily. 1 each 0    DAILY-VITE, WITH FOLIC ACID, 400 mcg Tab tablet Take 1 tablet by mouth daily.  escitalopram oxalate (LEXAPRO) 10 MG tablet Take 1 tablet (10 mg total) by mouth daily. 90 tablet 3    furosemide (LASIX) 80 MG tablet Take 1 tablet (80 mg total) by mouth daily. Please add to pill packs 90 tablet 0    inhalational spacing device (AEROCHAMBER MV) Spcr Use as directed with inhalers 1 each 0    lactulose (CEPHULAC) 20 gram packet Take 1 packet (20 g total) by mouth Three (3) times a day.      lancets Misc Use to test blood sugar daily 100 each 3    loratadine (CLARITIN) 10 mg tablet Take 1 tablet (10 mg total) by mouth daily. 90 tablet 1    magnesium oxide (MAG-OX) 400 mg (241.3 mg elemental magnesium) tablet Take 1 tablet (400 mg total) by mouth daily. 90 tablet 3    metFORMIN (GLUCOPHAGE-XR) 500 MG 24 hr tablet Take 1 tablet (500 mg total) by mouth daily with evening meal. 90 tablet 3    multivitamin (TAB-A-VITE/THERAGRAN) per tablet Take 1 tablet by mouth daily. 90 tablet 3    naltrexone (DEPADE) 50 mg tablet Take 1 tablet (50 mg total) by mouth daily. Please add to pill packs 30 tablet 11    omeprazole (PRILOSEC) 20 MG capsule Take 1 capsule (20 mg total) by mouth Two (2) times a day (30 minutes before a meal). 180 capsule 3    psyllium seed, with sugar, (FIBER ORAL) Take 4 capsules by mouth daily. BJs brand.      rifAXIMin (XIFAXAN) 550 mg Tab Take 1 tablet (550 mg total) by mouth two (2) times a day. 180 tablet 3 semaglutide (OZEMPIC) 2 mg/dose (8 mg/3 mL) PnIj Inject 2 mg under the skin every seven (7) days. (Patient not taking: Reported on 07/15/2023) 9 mL 3    spironolactone (ALDACTONE) 50 MG tablet Take 1 tablet (50 mg total) by mouth daily. 90 tablet 3     No current facility-administered medications for this visit.       ROS   ROS AS PER HPI     Objective:     GEN: No acute distress.   SKIN: Color is normal. No rashes.  PSYCH: Appropriate affect, normal mood  RESP: Normal work of breathing, no retractions     Assessment and Plan:   Tywanna was seen today for fatigue and altered mental status.    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    Thank you for choosing [facility] for your care!    If you have a question about a recent visit or a plan that was discussed with your provider in the past: Please go to [email or web address] (or your phone app) and sign in to your [facility] Chart to send this information. I do my best to respond to messages within 3 business days, but it is not always possible to provide the most timely and complete information. For more urgent questions, CALLING THE OFFICE is always the best option.    If you are experiencing any new or worsening symptoms or you would like to talk about changing a medication or medical plan; it is best to schedule an appointment: Please go onto My [facility] Chart call our clinic at (  [phone] to schedule an appointment with your provider or the Acute Care Clinic or you can access the [facility] on My [facility] Chart.  If you cannot decide if your issue warrants a visit, you can always ask to discuss this with one of our clinical care team.    If you have urgent healthcare needs after normal business hours, on weekends, or during holidays:  We have an Acute Care Clinic available by appointment. Call [phone] to schedule this. We offer care for health issues that do not require the emergency room.   Monday - Thursday (8am to 7pm)  Friday (8am to 5pm)   Saturdays (9am to 1pm)   You can always reach the [facility] 24/7 Nursing Line after hours to get nurse advice. The nurse can consult with the doctor on call if indicated. Just call our main office number and follow the prompts - [phone]     You may receive a patient satisfaction survey by mail, text or email regarding your visit today. Your opinion is important to me. Your response benefits [place] all :-)    If you had or will have testing done, test results will be posted on [facility] or sent via letter.  A member of our Care Team will also contact you by message or phone if your results require follow-up.Please note that specialty test results take longer. Lab results may post before I review them. We will follow up as needed when I review them.    Lorel Monaco. Julianny Milstein, MD -   [facility] - [facility]  [address], [place] [address]  Phone: [phone] Fax: [phone]  [email or web address]        RETURN TO CARE:  Return for 3:40 pm Feb 4th follow up medication etc..        Lorel Monaco.Cathey Endow, MD  Family Physician  [facility]  [address]  [place], [place] [address]  Ph [phone]  Fax [phone]

## 2023-07-16 NOTE — Unmapped (Signed)
Per Dr. Cathey Endow we are holding off on prior auth for now. Patient is holding medication. Decision will be made in the future about restarting.

## 2023-07-17 LAB — PHOSPHATIDYLETHANOL (PETH)
PETH 16:0/18:1 (POPETH) BY LC-MS/MS: <10 ng/mL
PETH 16:0/18:2 (PLPETH) BY LC-MS/MS: <10 ng/mL
PETH INTERPRETATION: NEGATIVE

## 2023-07-18 LAB — NICOTINE SCREEN, URINE
ANABASINE, URINE: <2 ng/mL
COTININE, URINE: <5 ng/mL
NICOTINE, URINE: <5 ng/mL
NORNICOTINE, URINE: <2 ng/mL

## 2023-07-23 NOTE — Unmapped (Signed)
Ozempic required appeal. Faxed appeal to [facility].

## 2023-07-23 NOTE — Unmapped (Signed)
PA approved. No further action necessary.

## 2023-07-24 DIAGNOSIS — K7210 Chronic hepatic failure without coma: Principal | ICD-10-CM

## 2023-07-24 DIAGNOSIS — Z7682 Awaiting organ transplant status: Principal | ICD-10-CM

## 2023-07-25 ENCOUNTER — Encounter: Admit: 2023-07-25 | Discharge: 2023-07-26 | Payer: PRIVATE HEALTH INSURANCE

## 2023-07-25 DIAGNOSIS — K7460 Unspecified cirrhosis of liver: Principal | ICD-10-CM

## 2023-07-25 DIAGNOSIS — K7682 Hepatic encephalopathy (CMS-HCC): Principal | ICD-10-CM

## 2023-07-25 DIAGNOSIS — R5383 Other fatigue: Principal | ICD-10-CM

## 2023-07-25 DIAGNOSIS — K7581 Nonalcoholic steatohepatitis (NASH): Principal | ICD-10-CM

## 2023-07-25 NOTE — Unmapped (Signed)
Could you put her in 3:40 pm appointment Feb 4th

## 2023-07-25 NOTE — Unmapped (Signed)
Scheduled patient for 2/4 for follow up

## 2023-07-29 NOTE — Unmapped (Addendum)
SPECIFIC INSTRUCTIONS WE DISCUSSED TODAY:  We will call you with next steps based on your lab results!!!  Use product like desitin on the skin of the buttocks to help with raw skin  We need to get you back on your lactulose - we will check in with hepatology.  We will consider restarting you on ozempic      Thank you for choosing [facility] for your care!    If you have a question about a recent visit or a plan that was discussed with your provider in the past: Please go to [email or web address] (or your phone app) and sign in to your [facility] Chart to send this information. I do my best to respond to messages within 3 business days, but it is not always possible to provide the most timely and complete information. For more urgent questions, CALLING THE OFFICE is always the best option.    If you are experiencing any new or worsening symptoms or you would like to talk about changing a medication or medical plan; it is best to schedule an appointment: Please go onto My [facility] Chart call our clinic at [phone] to schedule an appointment with your provider or the Acute Care Clinic or you can access the [facility] on My [facility] Chart.  If you cannot decide if your issue warrants a visit, you can always ask to discuss this with one of our clinical care team.    If you have urgent healthcare needs after normal business hours, on weekends, or during holidays:  We have an Acute Care Clinic available by appointment. Call [phone] to schedule this. We offer care for health issues that do not require the emergency room.   Monday - Thursday (8am to 7pm)  Friday (8am to 5pm)   Saturdays (9am to 1pm)   You can always reach the [facility] 24/7 Nursing Line after hours to get nurse advice. The nurse can consult with the doctor on call if indicated. Just call our main office number and follow the prompts - [phone]     You may receive a patient satisfaction survey by mail, text or email regarding your visit today. Your opinion is important to me. Your response benefits [place] all :-)    If you had or will have testing done, test results will be posted on [facility] or sent via letter.  A member of our Care Team will also contact you by message or phone if your results require follow-up.Please note that specialty test results take longer. Lab results may post before I review them. We will follow up as needed when I review them.    Lorel Monaco. Khrystyne Arpin, MD -   [facility] - [facility]  [address], [place] [address]  Phone: [phone] Fax: [phone]  [email or web address]

## 2023-07-30 ENCOUNTER — Encounter: Admit: 2023-07-30 | Discharge: 2023-07-31 | Payer: PRIVATE HEALTH INSURANCE

## 2023-07-30 DIAGNOSIS — K7210 Chronic hepatic failure without coma: Principal | ICD-10-CM

## 2023-07-30 DIAGNOSIS — Z7682 Awaiting organ transplant status: Principal | ICD-10-CM

## 2023-07-30 NOTE — Unmapped (Signed)
[facility]  [address]  [address]th Floor, [address]  [place], [place] [address]    Assessment and Plan:   Denise York is a 58 y.o. female that was seen in consultation at the request of Fix, Alfonso Ellis, MD for the evaluation of the following:     Penicillin allergy in the setting of evaluation for liver transplantation:  -While the details of Denise York reaction are unknown at the age of [age], she does report a mild reaction that did not require treatment or need for hospitalization and was managed only with discontinuation of the antibiotic.  Therefore, I do believe that Denise York risk of continuing to be reactive to penicillins is quite low.  -We discussed that when details of reaction are unknown, that we do typically like to perform skin testing to penicillins and penicillin derivatives.  However, given Denise York history of chronic pruritus and inability to discontinue antihistamines, we will not be able to perform skin prick testing.  -Therefore, we will proceed with a graded oral challenge to amoxicillin 500 mg.  She will receive 10% of Denise York dose with 30 minutes of monitoring followed by 90% of dose with an additional 30 minutes of monitoring.  I did instruct Denise York to hold Denise York carvedilol for 24 hours prior to challenge procedure.  -She just recently completed a course of antibiotics for UTI, so we will schedule graded oral challenge to amoxicillin in 2 weeks.  She will notify us if she becomes ill in the interim so that Denise York challenge procedure can be rescheduled.      The patient reports they are physically located in [place] and is currently: at home. I conducted a audio/video visit. I spent  16m 03s on the video call with the patient. I spent an additional 20 minutes on pre- and post-visit activities on the date of service .     No orders of the defined types were placed in this encounter.    Follow-up: Feb 3rd at noon for graded oral challenge to amoxicillin    Danton Sewer, MD  Division of Allergy & Immunology    Subjective   HISTORY OF PRESENT ILLNESS:  Denise York is a 58 y.o. female who presented for evaluation for penicillin allergy. History is obtained from Denise York. A thorough review of the available medical records was also performed.    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  As part of the transplantation evaluation, she was referred to the allergy clinic given Denise York history of penicillin allergy.    She states that she has had a penicillin allergy on Denise York medical record since the age of 15 or 58 years old.  At the time, she was receiving a penicillin antibiotic (name unknown) for what she thinks was likely a strep infection.  She was told by Denise York mom that she developed an allergic reaction.  She has no details of the allergic reaction but does note that this was overall mild and only required discontinuation of the antibiotic.  She did not require treatment or hospitalization.  She has not received any beta-lactam's including cephalosporins since that time to Denise York knowledge; however, Denise York husband believes that she may have actually received amoxicillin around the time of the dental procedure but is not 100% positive and states that this would have  been several years ago.  Denise York [place] record shows that she typically receives doxycycline or azithromycin for outpatient antibiotics if needed.    She does have a history of chronic itching related to Denise York liver disease for which she utilizes an oral antihistamine, typically Claritin.  Without this medication, she is quite miserable with pruritus significantly affecting Denise York quality of life and sleep.  She has no underlying respiratory issues but does have a history of congestive heart failure for which she utilizes carvedilol twice daily but does report intermittently missing doses of this medication.    She denies any history of asthma or chronic respiratory issues or need for inhalers, oxygen, or breathing treatments.    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    Obesity     Prediabetes        Surgeries:   Past Surgical History:   Procedure Laterality Date    ABDOMINAL WALL MESH  REMOVAL  1997, 2005    Placement, 1997, removal and replacement 2005    BLADDER SURGERY      bladder tact X2    BREAST BIOPSY Right 04/2013    benign    BREAST EXCISIONAL BIOPSY Right 05/2013    benign    BREAST SURGERY Right     precancerous spot removed    CHOLECYSTECTOMY      HYSTERECTOMY      IR EMBOLIZATION VENOUS OTHER THAN HEMORRHAGE  02/17/2023    IR EMBOLIZATION VENOUS OTHER THAN HEMORRHAGE 02/17/2023 Ammie Dalton, MD IMG VIR [facility]    PR COLSC FLX W/RMVL OF TUMOR POLYP LESION SNARE TQ N/A 10/14/2016    Procedure: COLONOSCOPY FLEX; W/REMOV TUMOR/LES BY SNARE;  Surgeon: Alfred Levins, MD;  Location: [facility] [place];  Service: Gastroenterology    PR COLSC FLX W/RMVL OF TUMOR POLYP LESION SNARE TQ N/A 01/15/2023    Procedure: COLONOSCOPY FLEX; W/REMOV TUMOR/LES BY SNARE;  Surgeon: Mickie Hillier, MD;  [facility]: [facility];  Service: Gastroenterology    PR PERC CLOS,CONG INTERATRIAL COMMUN W/IMPL N/A 09/12/2022    Procedure: ASD closure;  Surgeon: Jacquelyne Balint, MD;  Location: [facility] CATH;  Service: Cardiology    PR UPPER GI ENDOSCOPY,BIOPSY N/A 01/15/2023    Procedure: UGI ENDOSCOPY; WITH BIOPSY, SINGLE OR MULTIPLE;  Surgeon: Mickie Hillier, MD;  [facility]: [facility];  Service: Gastroenterology    SKIN BIOPSY         Medications:     Current Outpatient Medications Medication Sig Dispense Refill    atorvastatin (LIPITOR) 20 MG tablet Take 1 tablet (20 mg total) by mouth daily. 90 tablet 3    blood sugar diagnostic (GLUCOSE BLOOD) Strp Use to test blood sugar daily 100 strip 3    blood-glucose meter kit Use to test blood sugar DAILY 1 each 1    carvedilol (COREG) 6.25 MG tablet Take 1 tablet (6.25 mg total) by mouth two (2) times a day. Please add to pill pack 60 tablet 11    cholecalciferol, vitamin D3, 2,000 unit cap Take 2 capsules (4,000 Units  total) by mouth daily. 1 each 0    DAILY-VITE, WITH FOLIC ACID, 400 mcg Tab tablet Take 1 tablet by mouth daily.      escitalopram oxalate (LEXAPRO) 10 MG tablet Take 1 tablet (10 mg total) by mouth daily. 90 tablet 3    furosemide (LASIX) 80 MG tablet Take 1 tablet (80 mg total) by mouth daily. Please add to pill packs 90 tablet 0    inhalational spacing device (AEROCHAMBER MV) Spcr Use as directed with inhalers 1 each 0    lactulose (CEPHULAC) 20 gram packet Take 1 packet (20 g total) by mouth Three (3) times a day.      lancets Misc Use to test blood sugar daily 100 each 3    loratadine (CLARITIN) 10 mg tablet Take 1 tablet (10 mg total) by mouth daily. 90 tablet 1    magnesium oxide (MAG-OX) 400 mg (241.3 mg elemental magnesium) tablet Take 1 tablet (400 mg total) by mouth daily. 90 tablet 3    metFORMIN (GLUCOPHAGE-XR) 500 MG 24 hr tablet Take 1 tablet (500 mg total) by mouth daily with evening meal. 90 tablet 3    multivitamin (TAB-A-VITE/THERAGRAN) per tablet Take 1 tablet by mouth daily. 90 tablet 3    naltrexone (DEPADE) 50 mg tablet Take 1 tablet (50 mg total) by mouth daily. Please add to pill packs 30 tablet 11    omeprazole (PRILOSEC) 20 MG capsule Take 1 capsule (20 mg total) by mouth Two (2) times a day (30 minutes before a meal). 180 capsule 3    psyllium seed, with sugar, (FIBER ORAL) Take 4 capsules by mouth daily. [employer]Js brand.      rifAXIMin (XIFAXAN) 550 mg Tab Take 1 tablet (550 mg total) by mouth two (2) times a day. 180 tablet 3    semaglutide (OZEMPIC) 2 mg/dose (8 mg/3 mL) PnIj Inject 2 mg under the skin every seven (7) days. (Patient not taking: Reported on 07/15/2023) 9 mL 3    spironolactone (ALDACTONE) 50 MG tablet Take 1 tablet (50 mg total) by mouth daily. 90 tablet 3     No current facility-administered medications for this visit.       Allergies:     Allergies   Allergen Reactions    Penicillins Hives    Shellfish Containing Products Anaphylaxis    Glipizide Headache    Lisinopril Cough       Family History:     Family History   Problem Relation Age of Onset    Heart disease Mother     Hypertension Mother     COPD Mother     Heart disease Father     Kidney disease Father     Allergies Father     Gout Father     Diabetes Father     Allergies Other         FAMILY H/O    Coronary artery disease Other         FAMILY H/O    Kidney failure Other         FAMILY H/O    No Known Problems Sister     No Known Problems Daughter     No Known Problems Maternal Grandmother     No Known Problems Maternal Grandfather     No Known Problems Paternal Grandmother     No Known Problems Paternal Grandfather     Alcohol abuse Maternal Uncle     Birth defects Son     Birth defects  Son     BRCA 1/2 Neg Hx     Breast cancer Neg Hx     Cancer Neg Hx     Colon cancer Neg Hx     Endometrial cancer Neg Hx     Ovarian cancer Neg Hx     Substance Abuse Disorder Neg Hx     Mental illness Neg Hx        Objective:   PHYSICAL EXAM:  General : No apparent distress. Awake, alert, well appearing.  HEENT: Normocephalic, atraumatic. Mucous membranes are moist. No periorbital edema. Facial muscles move symmetrically.  Neck: Neck is symmetrical with trachea midline.  Eyes: PERRL. Eyelids and conjunctiva normal bilaterally.   Respiratory: Breathing is unlabored, no tachypnea.  Cardiovascular: No edema, no pallor, no cyanosis.  Abdomen: Non-distended.  Skin: No concerning rash or lesions noted on exposed skin.  Extremities: Normal range of motion observed. No peripheral edema.  Neuro: Mood and behavior appropriate for age.  Musculoskeletal: Symmetric and appropriate movements of extremities.

## 2023-08-06 NOTE — Unmapped (Signed)
Called patient in regards to scheduling her for day 2 liver evaluation appointments. Was not able to speak with her and left her a voice message. In my message asked her if she would be okay with me adding appointments to when she is already coming to the [facility] area and to call me back and let me know. Also in my message left my contact number.

## 2023-08-12 ENCOUNTER — Ambulatory Visit: Admit: 2023-08-12 | Discharge: 2023-08-13 | Payer: PRIVATE HEALTH INSURANCE

## 2023-08-12 DIAGNOSIS — K7581 Nonalcoholic steatohepatitis (NASH): Principal | ICD-10-CM

## 2023-08-12 DIAGNOSIS — R197 Diarrhea, unspecified: Principal | ICD-10-CM

## 2023-08-12 DIAGNOSIS — K7682 Hepatic encephalopathy (CMS-HCC): Principal | ICD-10-CM

## 2023-08-12 DIAGNOSIS — E119 Type 2 diabetes mellitus without complications: Principal | ICD-10-CM

## 2023-08-12 DIAGNOSIS — A09 Infectious gastroenteritis and colitis, unspecified: Principal | ICD-10-CM

## 2023-08-12 DIAGNOSIS — K7460 Unspecified cirrhosis of liver: Principal | ICD-10-CM

## 2023-08-12 DIAGNOSIS — R188 Other ascites: Principal | ICD-10-CM

## 2023-08-12 LAB — COMPREHENSIVE METABOLIC PANEL
ALBUMIN: 2.5 g/dL — ABNORMAL LOW (ref 3.4–5.0)
ALKALINE PHOSPHATASE: 94 U/L (ref 46–116)
ALT (SGPT): 34 U/L (ref 10–49)
ANION GAP: 12 mmol/L (ref 5–14)
AST (SGOT): 51 U/L — ABNORMAL HIGH (ref <=34)
BILIRUBIN TOTAL: 2.3 mg/dL — ABNORMAL HIGH (ref 0.3–1.2)
BLOOD UREA NITROGEN: 18 mg/dL (ref 9–23)
BUN / CREAT RATIO: 17
CALCIUM: 9.1 mg/dL (ref 8.7–10.4)
CHLORIDE: 102 mmol/L (ref 98–107)
CO2: 27 mmol/L (ref 20.0–31.0)
CREATININE: 1.05 mg/dL — ABNORMAL HIGH (ref 0.55–1.02)
EGFR CKD-EPI (2021) FEMALE: 62 mL/min/{1.73_m2} (ref >=60)
GLUCOSE RANDOM: 341 mg/dL — ABNORMAL HIGH (ref 70–179)
POTASSIUM: 4.4 mmol/L (ref 3.4–4.8)
PROTEIN TOTAL: 6.1 g/dL (ref 5.7–8.2)
SODIUM: 141 mmol/L (ref 135–145)

## 2023-08-12 LAB — CBC W/ AUTO DIFF
BASOPHILS ABSOLUTE COUNT: 0 10*9/L (ref 0.0–0.1)
BASOPHILS RELATIVE PERCENT: 0.9 %
EOSINOPHILS ABSOLUTE COUNT: 0 10*9/L (ref 0.0–0.5)
EOSINOPHILS RELATIVE PERCENT: 1.1 %
HEMATOCRIT: 27.1 % — ABNORMAL LOW (ref 34.0–44.0)
HEMOGLOBIN: 8.9 g/dL — ABNORMAL LOW (ref 11.3–14.9)
LYMPHOCYTES ABSOLUTE COUNT: 0.8 10*9/L — ABNORMAL LOW (ref 1.1–3.6)
LYMPHOCYTES RELATIVE PERCENT: 21 %
MEAN CORPUSCULAR HEMOGLOBIN CONC: 32.8 g/dL (ref 32.0–36.0)
MEAN CORPUSCULAR HEMOGLOBIN: 27.6 pg (ref 25.9–32.4)
MEAN CORPUSCULAR VOLUME: 84.3 fL (ref 77.6–95.7)
MEAN PLATELET VOLUME: 12.4 fL — ABNORMAL HIGH (ref 6.8–10.7)
MONOCYTES ABSOLUTE COUNT: 0.4 10*9/L (ref 0.3–0.8)
MONOCYTES RELATIVE PERCENT: 10.7 %
NEUTROPHILS ABSOLUTE COUNT: 2.4 10*9/L (ref 1.8–7.8)
NEUTROPHILS RELATIVE PERCENT: 66.3 %
NUCLEATED RED BLOOD CELLS: 0 /100{WBCs} (ref <=4)
PLATELET COUNT: 35 10*9/L — ABNORMAL LOW (ref 150–450)
RED BLOOD CELL COUNT: 3.22 10*12/L — ABNORMAL LOW (ref 3.95–5.13)
RED CELL DISTRIBUTION WIDTH: 15.1 % (ref 12.2–15.2)
WBC ADJUSTED: 3.7 10*9/L (ref 3.6–11.2)

## 2023-08-12 LAB — PROTIME-INR
INR: 1.28
PROTIME: 14.6 s — ABNORMAL HIGH (ref 9.9–12.6)

## 2023-08-12 LAB — APTT
APTT: 31.3 s (ref 24.8–38.4)
HEPARIN CORRELATION: 0.2

## 2023-08-12 NOTE — Unmapped (Signed)
Call received from PCP, Dr. Almyra Brace, re: patient with AMS. She stopped taking lactulose. Dr. Cathey Endow would like to speak with someone to discuss possible admission.     Will forward to live transplant coordinator Corrie, RN for follow up.       Shon Baton, MSN-Ed, RN,   Nursing Coordinator for   Dr. Eudelia Bunch & Betsey Holiday., PA-C  [facility]   [phone]

## 2023-08-12 NOTE — Unmapped (Signed)
Chief Complaint:  Chief Complaint   Patient presents with    Hepatic encephalopathy     Wants to wait on mammo order    Hypertension    Diabetes    frequent bowel movements     3-6 times a day black diarrhea, took lactulose about a week ago    Fatigue       This patients last WCC/CPE date: : 06/27/2023  This patient's last AWV date: [facility] Last Medicare Wellness Visit Date: Not Found    History of Present Illness:  Denise York is a 58 y.o. female with past medical history as below presents today for evaluation/follow up of hepatic encephalopathy, HTN, liver disease, and fatigue    She is not doing well and is very tired and fatigued today. She is here with her cousin Dois Davenport. She reports having 3-4 days of having frequent black diarrhea starting 1 week ago and thought it could be related to lactulose. SBecause of this, he stopped taking lactulose 1 week ago and feels like her diarrhea has only just improved yesterday. She reports that the numerous bowel movements were causing irritation around her anus. Prior to this starting, she says she would start taking the lactulose twice daily but if she didn't have a bowel movement by 10am, she would start taking it q4h until she had 3 stools per day. Denies BRBPR, unusual bruising, recent falls, chest pain, shortness of breath. Patient has been on different antibiotics in the past few months.     She says she almost went to the ED during this time because she felt so dehydrated - she was drinking up to 10 cups of water and unable to quench her thirst. POC glucose checked today was in the 300's and she says she has not been taking her ozempic per PCP advice due to issues with constipation. Her weight is up 17 pounds today from her last office visit. She continues to feel tired and sleep a lot.     Patient Care Team:  Cathey Endow Heywood Footman, MD as PCP - General (Family Medicine)  Cathey Endow, Heywood Footman, MD as PCP - Rosann Auerbach, Betsey Holiday, PA as Physician Assistant (Gastroenterology)  Jacquelyne Balint, MD as Consulting Physician (Cardiovascular Disease)  Fix, Alfonso Ellis, MD as Evaluating Hepatologist (Gastroenterology)  Jobie Quaker, RN as Transplant Coordinator (Transplant)  Smiley Houseman as Case Manager/Social Worker (Transplant)  Elease Etienne as Transplant Financial Coordinator (Transplant)  Lemya Greenwell, Heywood Footman, MD as Referring Physician (Family Medicine)  Andee Poles, PsyD as Transplant Psychologist (Transplant Surgery)    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    Obesity     Prediabetes      Patient Active Problem List   Diagnosis    Insomnia    Vitamin deficiency    Dysthymic disorder    Restless leg syndrome    Urinary frequency    Incomplete bladder emptying    Nonspecific abnormal results of liver function study    Edema    Vitamin D deficiency    Itch    Fatty liver disease, nonalcoholic  Constipation    Depression    Neoplasm of right breast, 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)    Type 2 diabetes mellitus without complication (CMS-HCC)    Bowel incontinence    Mixed incontinence urge and stress    Encounter for long-term current use of medication    Obesity    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    Back pain    Neoplasm of uncertain behavior    Other cirrhosis of liver (CMS-HCC)    Portal hypertension (CMS-HCC)    Hepatic encephalopathy (CMS-HCC)    Thrombocytopenia (CMS-HCC)    Melena    Lower extremity edema    ASD (atrial septal defect) - s/p repair    Nausea and vomiting    Nonobstructive atherosclerosis of coronary artery    Other ascites    Pruritus    Secondary esophageal varices without bleeding (CMS-HCC) Metabolic dysfunction-associated steatohepatitis (MASH)    Intention tremor     OB History       Gravida   2    Para   2    Term   2    Preterm        AB        Living   0         SAB        IAB        Ectopic        Molar        Multiple        Live Births   2              Past Surgical History:   Procedure Laterality Date    ABDOMINAL WALL MESH  REMOVAL  1997, 2005    Placement, 1997, removal and replacement 2005    BLADDER SURGERY      bladder tact X2    BREAST BIOPSY Right 04/2013    benign    BREAST EXCISIONAL BIOPSY Right 05/2013    benign    BREAST SURGERY Right     precancerous spot removed    CHOLECYSTECTOMY      HYSTERECTOMY      IR EMBOLIZATION VENOUS OTHER THAN HEMORRHAGE  02/17/2023    IR EMBOLIZATION VENOUS OTHER THAN HEMORRHAGE 02/17/2023 Ammie Dalton, MD IMG VIR H&V [facility]    PR COLSC FLX W/RMVL OF TUMOR POLYP LESION SNARE TQ N/A 10/14/2016    Procedure: COLONOSCOPY FLEX; W/REMOV TUMOR/LES BY SNARE;  Surgeon: Alfred Levins, MD;  Location: [facility] GI PROCEDURES [place];  Service: Gastroenterology    PR COLSC FLX W/RMVL OF TUMOR POLYP LESION SNARE TQ N/A 01/15/2023    Procedure: COLONOSCOPY FLEX; W/REMOV TUMOR/LES BY SNARE;  Surgeon: Mickie Hillier, MD;  [facility]: [facility];  Service: Gastroenterology    PR PERC CLOS,CONG INTERATRIAL COMMUN W/IMPL N/A 09/12/2022    Procedure: ASD closure;  Surgeon: Jacquelyne Balint, MD;  Location: [facility] CATH;  Service: Cardiology    PR UPPER GI ENDOSCOPY,BIOPSY N/A 01/15/2023    Procedure: UGI ENDOSCOPY; WITH BIOPSY, SINGLE OR MULTIPLE;  Surgeon: Mickie Hillier, MD;  Location: [facility];  Service: Gastroenterology    SKIN BIOPSY         Allergies:  Penicillins, Shellfish containing products, Glipizide, and Lisinopril    Current Outpatient Medications   Medication Sig Dispense Refill    atorvastatin (LIPITOR) 20 MG tablet Take 1 tablet (20 mg total) by mouth daily. 90  tablet 3    blood sugar diagnostic (GLUCOSE BLOOD) Strp Use to test blood sugar daily 100 strip 3    blood-glucose meter kit Use to test blood sugar DAILY 1 each 1    carvedilol (COREG) 6.25 MG tablet Take 1 tablet (6.25 mg total) by mouth two (2) times a day. Please add to pill pack 60 tablet 11    cholecalciferol, vitamin D3, 2,000 unit cap Take 2 capsules (4,000 Units total) by mouth daily. 1 each 0    DAILY-VITE, WITH FOLIC ACID, 400 mcg Tab tablet Take 1 tablet by mouth daily.      escitalopram oxalate (LEXAPRO) 10 MG tablet Take 1 tablet (10 mg total) by mouth daily. 90 tablet 3    furosemide (LASIX) 80 MG tablet Take 1 tablet (80 mg total) by mouth daily. Please add to pill packs 90 tablet 0    inhalational spacing device (AEROCHAMBER MV) Spcr Use as directed with inhalers 1 each 0    lancets Misc Use to test blood sugar daily 100 each 3    loratadine (CLARITIN) 10 mg tablet Take 1 tablet (10 mg total) by mouth daily. 90 tablet 1    magnesium oxide (MAG-OX) 400 mg (241.3 mg elemental magnesium) tablet Take 1 tablet (400 mg total) by mouth daily. 90 tablet 3    metFORMIN (GLUCOPHAGE-XR) 500 MG 24 hr tablet Take 1 tablet (500 mg total) by mouth daily with evening meal. 90 tablet 3    multivitamin (TAB-A-VITE/THERAGRAN) per tablet Take 1 tablet by mouth daily. 90 tablet 3    naltrexone (DEPADE) 50 mg tablet Take 1 tablet (50 mg total) by mouth daily. Please add to pill packs 30 tablet 11    omeprazole (PRILOSEC) 20 MG capsule Take 1 capsule (20 mg total) by mouth Two (2) times a day (30 minutes before a meal). 180 capsule 3    psyllium seed, with sugar, (FIBER ORAL) Take 4 capsules by mouth daily. BJs brand.      rifAXIMin (XIFAXAN) 550 mg Tab Take 1 tablet (550 mg total) by mouth two (2) times a day. 180 tablet 3    spironolactone (ALDACTONE) 50 MG tablet Take 1 tablet (50 mg total) by mouth daily. 90 tablet 3    lactulose (CEPHULAC) 20 gram packet Take 1 packet (20 g total) by mouth Three (3) times a day. (Patient not taking: Reported on 08/12/2023)      semaglutide (OZEMPIC) 2 mg/dose (8 mg/3 mL) PnIj Inject 2 mg under the skin every seven (7) days. (Patient not taking: Reported on 08/12/2023) 9 mL 3     No current facility-administered medications for this visit.     Social History     Socioeconomic History    Marital status: Married     Spouse name: None    Number of children: None    Years of education: None    Highest education level: None   Tobacco Use    Smoking status: Never     Passive exposure: Past    Smokeless tobacco: Never   Vaping Use    Vaping status: Never Used   Substance and Sexual Activity    Alcohol use: No    Drug use: No    Sexual activity: Not Currently     Birth control/protection: Abstinence, None   Other Topics Concern    Do you use sunscreen? Yes    Excessive sun exposure? Yes   Social History Narrative    2 births. No living children.  Married new husband 2014.         02/21/2017        PCMH Components:        Family, social, cultural characteristics: social support includes best friend .      Patient has the following communication needs: none, per patient     Health Literacy: How confident are you that you understand your health issues/concerns, can participate in your care, and manage your care along with your physician: confident.    Behaviors Affecting Health: none, per patient    Family history of mental health illness and/or substance abuse: asked patient/parent and none disclosed.    Have you been seen by any medical provider that we have not referred you to since your last visit ? No    Discussed a Living Will with the patient and HKV:[id number] is under the age of 26.      Social Drivers of Health     Food Insecurity: No Food Insecurity (07/15/2023)    Hunger Vital Sign     Worried About Running Out of Food in the Last Year: Never true     Ran Out of Food in the Last Year: Never true   Transportation Needs: No Transportation Needs (07/15/2023)    PRAPARE - Therapist, art (Medical): No     Lack of Transportation (Non-Medical): No     Family History   Problem Relation Age of Onset    Heart disease Mother     Hypertension Mother     COPD Mother     Heart disease Father     Kidney disease Father     Allergies Father     Gout Father     Diabetes Father     Allergies Other         FAMILY H/O    Coronary artery disease Other         FAMILY H/O    Kidney failure Other         FAMILY H/O    No Known Problems Sister     No Known Problems Daughter     No Known Problems Maternal Grandmother     No Known Problems Maternal Grandfather     No Known Problems Paternal Grandmother     No Known Problems Paternal Grandfather     Alcohol abuse Maternal Uncle     Birth defects Son     Birth defects Son     BRCA 1/2 Neg Hx     Breast cancer Neg Hx     Cancer Neg Hx     Colon cancer Neg Hx     Endometrial cancer Neg Hx     Ovarian cancer Neg Hx     Substance Abuse Disorder Neg Hx     Mental illness Neg Hx      Immunization History   Administered Date(s) Administered    COVID-19 VAC,MRNA,TRIS(12Y UP)(PFIZER)(GRAY CAP) 08/19/2020    COVID-19 VACC,MRNA,(PFIZER)(PF) 10/02/2019, 10/23/2019    Covid-19 Vac, (34yr+) (Comirnaty) Mrna Pfizer  04/08/2022, 07/14/2023    HEPATITIS B VACCINE ADULT, ADJUVANTED, IM(HEPLISAV B) 06/17/2022, 09/26/2022    Hepatitis A (Adult) 06/17/2022, 09/26/2022    INFLUENZA INJ MDCK PF, QUAD,(FLUCELVAX)(66MO AND UP EGG FREE) 05/15/2021    INFLUENZA TIV (TRI) PF (IM)(HISTORICAL) 04/09/2011    INFLUENZA VACCINE IIV3(IM)(PF)6 MOS UP 06/12/2023    Influenza TRI (IIV3) 5+yrs MDV 06/04/2013    Influenza Vaccine Quad(IM)6 MO-Adult(PF) 05/08/2017, 04/09/2018, 03/11/2019    Influenza  Virus Vaccine, unspecified formulation 04/08/2015, 04/07/2016, 05/04/2020, 04/08/2022    PNEUMOCOCCAL POLYSACCHARIDE 23-VALENT 07/19/2014    Pneumococcal Conjugate 20-valent 06/17/2022    SHINGRIX-ZOSTER VACCINE (HZV),RECOMBINANT,ADJUVANTED(IM) 11/09/2020, 05/15/2021    TdaP 07/16/2011, 06/17/2022       Health Maintenance   Topic Date Due    Mammogram  10/28/2023    Retinal Eye Exam 10/02/2023    Hemoglobin A1c  11/29/2023    Foot Exam  06/26/2024    Urine Albumin/Creatinine Ratio  06/26/2024    Serum Creatinine Monitoring  08/11/2024    Potassium Monitoring  08/11/2024    Colon Cancer Screening  01/15/2028    DTaP/Tdap/Td Vaccines (3 - Td or Tdap) 06/17/2032    Pneumococcal Vaccine 50+  Completed    Hepatitis C Screen  Completed    COVID-19 Vaccine  Completed    Influenza Vaccine  Completed    Zoster Vaccines  Completed       I have reviewed and (if needed) updated the patient's problem list, medications, allergies, past medical and surgical history, social and family history      Review of Systems:  As per HPI      Physical Exam:  Vital Signs:  Vitals:    08/12/23 1547   BP: 112/60   Pulse: 80   Resp: 18   Temp: 36.7 ??C (98.1 ??F)   SpO2: 93%     Height: 163.8 cm (5' 4.49)    Body mass index is 36.82 kg/m??.  Wt Readings from Last 3 Encounters:   08/12/23 98.8 kg (217 lb 12.8 oz)   07/15/23 93.3 kg (205 lb 9.6 oz)   07/14/23 91.6 kg (201 lb 14.4 oz)     No LMP recorded. Patient has had a hysterectomy.    General:fatigued appearing. Slightly slow to respond to questions, no acute distress. See BMI. Weight up 16 pounds in past month.   Head: Normocephalic, Atraumatic  Eyes: Sclera and conjunctiva clear, no drainage.PERRL, EOMI bilaterally.  Ears: External ears normal, Canals clear,  TMs with normal light reflex  Nose Nares normal, No sinus TTP   Mouth mucus membranes moist, posterior oropharynx without erythema or exudates. dentition normal.  Neck: Supple, normal ROM, no thyromegaly.  Lymph Nodes: No cervical or supraclavicular lymphadenopathy.  Skin: few petechiae on left forearm.   Cardiovascular: RRR, normal S1/S2, no murmurs, rubs or gallops. No chest wall TTP.  Lungs: CTA bilaterally, without crackles/wheezes/rhonchi, good air movement, no increased WOB  Musculoskeletal: Normal tone UEs/LEs  Neurologic: CNs 2-12 grossly intact, slightly unstable with walking. NO ASTERIXIS. Left hand greater than right hand fine rest tremor.   Psychiatry: Pleasant affect, well kept, no abnormalities. Fatigued appearing but oriented to time, place, date, day and year.     Imaging Studies:  No imaging ordered this visit.    Labs:  Recent Results (from the past 4 weeks)   POCT Glucose    Collection Time: 08/12/23  4:27 PM   Result Value Ref Range    Glucose, POC 311 (A) 65 - 179 mg/dL    Glucose Strip Lot Num [id number]     Glucose Strip Exp 11/03/2024     Serial Number     Comprehensive Metabolic Panel    Collection Time: 08/12/23  4:58 PM   Result Value Ref Range    Sodium 141 135 - 145 mmol/L    Potassium 4.4 3.4 - 4.8 mmol/L    Chloride 102 98 - 107 mmol/L    CO2 27.0 20.0 - 31.0 mmol/L  Anion Gap 12 5 - 14 mmol/L    BUN 18 9 - 23 mg/dL    Creatinine 1.61 (H) 0.55 - 1.02 mg/dL    BUN/Creatinine Ratio 17     eGFR CKD-EPI (2021) Female 62 >=60 mL/min/1.32m2    Glucose 341 (H) 70 - 179 mg/dL    Calcium 9.1 8.7 - 09.6 mg/dL    Albumin 2.5 (L) 3.4 - 5.0 g/dL    Total Protein 6.1 5.7 - 8.2 g/dL    Total Bilirubin 2.3 (H) 0.3 - 1.2 mg/dL    AST 51 (H) <=04 U/L    ALT 34 10 - 49 U/L    Alkaline Phosphatase 94 46 - 116 U/L   APTT    Collection Time: 08/12/23  4:58 PM   Result Value Ref Range    APTT 31.3 24.8 - 38.4 sec    Heparin Correlation 0.2    Protime-INR    Collection Time: 08/12/23  4:58 PM   Result Value Ref Range    PT 14.6 (H) 9.9 - 12.6 sec    INR 1.28    CBC w/ Differential    Collection Time: 08/12/23  4:58 PM   Result Value Ref Range    WBC 3.7 3.6 - 11.2 10*9/L    RBC 3.22 (L) 3.95 - 5.13 10*12/L    HGB 8.9 (L) 11.3 - 14.9 g/dL    HCT 54.0 (L) 98.1 - 44.0 %    MCV 84.3 77.6 - 95.7 fL    MCH 27.6 25.9 - 32.4 pg    MCHC 32.8 32.0 - 36.0 g/dL    RDW 19.1 47.8 - 29.5 %    MPV 12.4 (H) 6.8 - 10.7 fL    Platelet 35 (L) 150 - 450 10*9/L    nRBC 0 <=4 /100 WBCs    Neutrophils % 66.3 %    Lymphocytes % 21.0 %    Monocytes % 10.7 %    Eosinophils % 1.1 %    Basophils % 0.9 %    Absolute Neutrophils 2.4 1.8 - 7.8 10*9/L Absolute Lymphocytes 0.8 (L) 1.1 - 3.6 10*9/L    Absolute Monocytes 0.4 0.3 - 0.8 10*9/L    Absolute Eosinophils 0.0 0.0 - 0.5 10*9/L    Absolute Basophils 0.0 0.0 - 0.1 10*9/L       Assessment & Plan: Pathophysiology of condition(s) reviewed with patient if applicable. Pt stated understanding and there were no barriers to learning.  SEE PATIENT INSTRUCTIONS BELOW FOR DISCUSSION/PLAN     Diagnosis ICD-10-CM Associated Orders   1. Type 2 diabetes mellitus without complication, without long-term current use of insulin (CMS-HCC)  E11.9 POCT Glucose     Comprehensive Metabolic Panel     CBC w/ Differential     APTT     Protime-INR     Comprehensive Metabolic Panel     CBC w/ Differential     APTT     Protime-INR      2. 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 Hepatic encephalopathy (CMS-HCC)  K76.82 Comprehensive Metabolic Panel     CBC w/ Differential     APTT     Protime-INR     Comprehensive Metabolic Panel  CBC w/ Differential     APTT     Protime-INR      4. Ascites of liver  R18.8 Comprehensive Metabolic Panel     CBC w/ Differential     APTT     Protime-INR     Comprehensive Metabolic Panel     CBC w/ Differential     APTT     Protime-INR      5. Diarrhea of infectious origin  A09       6. Diarrhea, unspecified type  R19.7 Comprehensive Metabolic Panel     CBC w/ Differential     APTT     Protime-INR     Comprehensive Metabolic Panel     CBC w/ Differential     APTT     Protime-INR     C. Difficile Assay        I personally spent 33 minutes face-to-face and non-face-to-face in the care of this patient, which includes all pre, intra, and post visit time on the date of service.  We discussed treatment goals and options as well as possible side effects of the prescribed medication regimen. There were no significant barriers to learning identified. The patient /and or Guardian/POA Healthcare agrees to continue the present plan except as outlined below.          EST NEW  [id number]      10-19 15-29  99213     20-29 30-44  99214     30-39 45-59  99215     40+  60+      Patient Instructions   SPECIFIC INSTRUCTIONS WE DISCUSSED TODAY:  We will call you with next steps based on your lab results!!!  Use product like desitin on the skin of the buttocks to help with raw skin  We need to get you back on your lactulose - we will check in with hepatology.  We will consider restarting you on ozempic      Thank you for choosing [facility] for your care!    If you have a question about a recent visit or a plan that was discussed with your provider in the past: Please go to [email or web address] (or your phone app) and sign in to your [facility] Chart to send this information. I do my best to respond to messages within 3 business days, but it is not always possible to provide the most timely and complete information. For more urgent questions, CALLING THE OFFICE is always the best option.    If you are experiencing any new or worsening symptoms or you would like to talk about changing a medication or medical plan; it is best to schedule an appointment: Please go onto My [facility] Chart call our clinic at [phone] to schedule an appointment with your provider or the Acute Care Clinic or you can access the [facility] on My [facility] Chart.  If you cannot decide if your issue warrants a visit, you can always ask to discuss this with one of our clinical care team.    If you have urgent healthcare needs after normal business hours, on weekends, or during holidays:  We have an Acute Care Clinic available by appointment. Call [phone] to schedule this. We offer care for health issues that do not require the emergency room.   Monday - Thursday (8am to 7pm)  Friday (8am to 5pm)   Saturdays (9am to 1pm)   You can always reach the [facility] 24/7 Nursing Line  after hours to get nurse advice. The nurse can consult with the doctor on call if indicated. Just call our main office number and follow the prompts - [phone]     You may receive a patient satisfaction survey by mail, text or email regarding your visit today. Your opinion is important to me. Your response benefits [place] all :-)    If you had or will have testing done, test results will be posted on [facility] or sent via letter.  A member of our Care Team will also contact you by message or phone if your results require follow-up.Please note that specialty test results take longer. Lab results may post before I review them. We will follow up as needed when I review them.    Lorel Monaco. Ksenia Kunz, MD -   [facility]  [address], [place] [address]  Phone: [phone] Fax: [phone]  [email or web address]        No follow-ups on file.    Lorel Monaco.Cathey Endow, MD  Family Physician    [facility]  [address]  [place], [place] [address]  Ph [phone]  Fax [phone]

## 2023-08-13 ENCOUNTER — Encounter
Admit: 2023-08-13 | Discharge: 2023-08-17 | Disposition: A | Discharge status: Home / Self Care | Payer: PRIVATE HEALTH INSURANCE | Admitting: Internal Medicine

## 2023-08-13 ENCOUNTER — Encounter
Admit: 2023-08-13 | Discharge: 2023-08-17 | Disposition: A | Discharge status: Home / Self Care | Payer: PRIVATE HEALTH INSURANCE | Attending: Anesthesiology | Admitting: Internal Medicine

## 2023-08-13 ENCOUNTER — Encounter
Admit: 2023-08-13 | Discharge: 2023-08-17 | Disposition: A | Discharge status: Home / Self Care | Payer: PRIVATE HEALTH INSURANCE | Attending: Internal Medicine | Admitting: Internal Medicine

## 2023-08-13 ENCOUNTER — Inpatient Hospital Stay
Admit: 2023-08-13 | Discharge: 2023-08-17 | Disposition: A | Discharge status: Home / Self Care | Payer: PRIVATE HEALTH INSURANCE | Admitting: Internal Medicine

## 2023-08-13 ENCOUNTER — Ambulatory Visit
Admit: 2023-08-13 | Discharge: 2023-08-17 | Disposition: A | Discharge status: Home / Self Care | Payer: PRIVATE HEALTH INSURANCE | Admitting: Internal Medicine

## 2023-08-13 LAB — URINALYSIS WITH MICROSCOPY WITH CULTURE REFLEX PERFORMABLE
BILIRUBIN UA: NEGATIVE
BLOOD UA: NEGATIVE
GLUCOSE UA: NEGATIVE
LEUKOCYTE ESTERASE UA: NEGATIVE
NITRITE UA: NEGATIVE
PH UA: 6 (ref 5.0–9.0)
RBC UA: <1 /HPF (ref <=4)
SPECIFIC GRAVITY UA: 1.026 (ref 1.003–1.030)
SQUAMOUS EPITHELIAL: 10 /HPF — ABNORMAL HIGH (ref 0–5)
UROBILINOGEN UA: 4 — AB
WBC UA: 7 /HPF — ABNORMAL HIGH (ref 0–5)

## 2023-08-13 LAB — CBC W/ AUTO DIFF
BASOPHILS ABSOLUTE COUNT: 0 10*9/L (ref 0.0–0.1)
BASOPHILS RELATIVE PERCENT: 0.6 %
EOSINOPHILS ABSOLUTE COUNT: 0.1 10*9/L (ref 0.0–0.5)
EOSINOPHILS RELATIVE PERCENT: 2.2 %
HEMATOCRIT: 27.4 % — ABNORMAL LOW (ref 34.0–44.0)
HEMOGLOBIN: 9.1 g/dL — ABNORMAL LOW (ref 11.3–14.9)
LYMPHOCYTES ABSOLUTE COUNT: 0.7 10*9/L — ABNORMAL LOW (ref 1.1–3.6)
LYMPHOCYTES RELATIVE PERCENT: 22.6 %
MEAN CORPUSCULAR HEMOGLOBIN CONC: 33.2 g/dL (ref 32.0–36.0)
MEAN CORPUSCULAR HEMOGLOBIN: 28 pg (ref 25.9–32.4)
MEAN CORPUSCULAR VOLUME: 84.3 fL (ref 77.6–95.7)
MEAN PLATELET VOLUME: 11.3 fL — ABNORMAL HIGH (ref 6.8–10.7)
MONOCYTES ABSOLUTE COUNT: 0.3 10*9/L (ref 0.3–0.8)
MONOCYTES RELATIVE PERCENT: 9.2 %
NEUTROPHILS ABSOLUTE COUNT: 1.9 10*9/L (ref 1.8–7.8)
NEUTROPHILS RELATIVE PERCENT: 65.4 %
NUCLEATED RED BLOOD CELLS: 0 /100{WBCs} (ref <=4)
PLATELET COUNT: 37 10*9/L — ABNORMAL LOW (ref 150–450)
RED BLOOD CELL COUNT: 3.26 10*12/L — ABNORMAL LOW (ref 3.95–5.13)
RED CELL DISTRIBUTION WIDTH: 15.3 % — ABNORMAL HIGH (ref 12.2–15.2)
WBC ADJUSTED: 3 10*9/L — ABNORMAL LOW (ref 3.6–11.2)

## 2023-08-13 LAB — COMPREHENSIVE METABOLIC PANEL
ALBUMIN: 2.6 g/dL — ABNORMAL LOW (ref 3.4–5.0)
ALKALINE PHOSPHATASE: 100 U/L (ref 46–116)
ALT (SGPT): 38 U/L (ref 10–49)
ANION GAP: 13 mmol/L (ref 5–14)
AST (SGOT): 54 U/L — ABNORMAL HIGH (ref <=34)
BILIRUBIN TOTAL: 1.6 mg/dL — ABNORMAL HIGH (ref 0.3–1.2)
BLOOD UREA NITROGEN: 18 mg/dL (ref 9–23)
BUN / CREAT RATIO: 19
CALCIUM: 9.2 mg/dL (ref 8.7–10.4)
CHLORIDE: 103 mmol/L (ref 98–107)
CO2: 26.7 mmol/L (ref 20.0–31.0)
CREATININE: 0.94 mg/dL (ref 0.55–1.02)
EGFR CKD-EPI (2021) FEMALE: 71 mL/min/{1.73_m2} (ref >=60)
GLUCOSE RANDOM: 243 mg/dL — ABNORMAL HIGH (ref 70–179)
POTASSIUM: 4.5 mmol/L (ref 3.4–4.8)
PROTEIN TOTAL: 6.3 g/dL (ref 5.7–8.2)
SODIUM: 143 mmol/L (ref 135–145)

## 2023-08-13 LAB — APTT
APTT: 29.9 s (ref 24.8–38.4)
HEPARIN CORRELATION: 0.2

## 2023-08-13 LAB — HEMOGLOBIN AND HEMATOCRIT, BLOOD
HEMATOCRIT: 25.3 % — ABNORMAL LOW (ref 34.0–44.0)
HEMOGLOBIN: 8.5 g/dL — ABNORMAL LOW (ref 11.3–14.9)

## 2023-08-13 LAB — FERRITIN: FERRITIN: 11.6 ng/mL (ref 7.3–270.7)

## 2023-08-13 LAB — LIPASE: LIPASE: 57 U/L — ABNORMAL HIGH (ref 12–53)

## 2023-08-13 LAB — PROTIME-INR
INR: 1.32
PROTIME: 15.1 s — ABNORMAL HIGH (ref 9.9–12.6)

## 2023-08-13 LAB — AMMONIA: AMMONIA: 161 umol/L — ABNORMAL HIGH (ref 11–32)

## 2023-08-13 MED ADMIN — pantoprazole (Protonix) injection 80 mg: 80 mg | INTRAVENOUS | @ 19:00:00 | Stop: 2023-08-13

## 2023-08-13 MED ADMIN — lactulose (CEPHULAC) packet 20 g: 20 g | ORAL | @ 22:00:00 | Stop: 2023-08-13

## 2023-08-13 MED ADMIN — cefTRIAXone (ROCEPHIN) 1 g in sodium chloride 0.9 % (NS) 100 mL IVPB-MBP: 1 g | INTRAVENOUS | @ 19:00:00 | Stop: 2023-08-13

## 2023-08-13 MED ADMIN — iohexol (OMNIPAQUE) 350 mg iodine/mL solution 100 mL: 100 mL | INTRAVENOUS | @ 22:00:00 | Stop: 2023-08-13

## 2023-08-13 NOTE — Unmapped (Signed)
[facility]  Emergency Department Provider Note      ED Clinical Impression       Diagnosis ICD-10-CM Associated Orders   1. Melena  K92.1       2. Hepatic encephalopathy (CMS-HCC)  K76.82                Impression, Medical Decision Making, Progress Notes and Critical Care      Impression, Differential Diagnosis and Plan of Care    Denise York is a 58 y.o. female with PMH of cirrhosis secondary to NAFLD with history of hepatic encephalopathy, viral hepatitis, CHF, DM II presents to the emergency department with concern of melena and increased confusion x 7 days.     On physical exam, lethargic, but oriented and in no acute distress. Vital signs stable. Soft, non-tender abdomen with normal bowel sounds. Clear, equal bilateral breath sounds. Normal cardiac rate and rhythm.     Based on history, physical exam finding, concern for upper GI bleed,  with concern for variceal bleed given hx of cirrhosis. Additionally concern for hepatic encephalopathy given not currently taking lactulose with increased confusion. Ordered CBC, CMP, lipase, ammonia, UA, and non contrast CTA abdomen.     Labs and imaging as reviewed below. Imaging without active hemorrhage. Discussed with GI, who also evaluated patient at bedside.  They recommend regular dosing of lactulose, Rocephin and Protonix (which have already been given at this time), octreotide.  They recommend transfer to main for medicine admit given no availability of GI overnight.  The plan of care was discussed with patient who verbalizes understanding and agreement.  Discussed with the [facility] attending, who accepted patient for further management at this time.      ED Course as of 08/13/23 1843   Wed Aug 13, 2023   1609 CBC without leukocytosis, hemoglobin decreased from baseline at 9.1.  CMP with mild elevation of T. bili.  Ammonia elevated at 161, lipase minimally elevated as well.   1808 CTA Abdomen Pelvis Gi Bleed  CTA abdomen pelvis without active GI hemorrhage.  Mild.  Pancreatic stranding, possibly secondary to pancreatitis.  Hepatic cirrhosis with portal hypertension noted with mild splenomegaly, upper abdominal and esophageal varices and small volume ascites.   1812 Discussed with GI, they recommend transfer to main for further management.  Patient is already been given Rocephin, Protonix, can also additionally give octreotide, as regularly scheduled lactulose   1827 Discussed with [facility], patient accepted for ED to ED transfer for the further management       Additional MDM Elements    Portions of this record have been created using Dragon dictation software. Dictation errors have been sought, but may not have been identified and corrected.    See chart and nursing documentation for additional ED course details.    ____________________________________________         History        Reason for Visit  Diarrhea      HPI   Denise York is a 58 y.o. female with PMH of cirrhosis secondary to NAFLD, viral hepatitis, CHF, DM II, and hepatic encephalopathy presents to the emergency department with complaints of melena for 7 days. Patient notes dark tarry stools with intermittent diarrhea, denies prior similar episodes or frank hematochezia. States she thought her symptoms were due to lactulose, which she is prescribed for her hepatic encephalopathy, so she discontinued approximately 7 days ago.  Family notes patient has appeared to be more confused consistent with previous  episodes of hepatic encephalopathy.  Pt denies nausea, vomiting, chest pain, or SOB.    Outside Historian(s)  Son-in-law at bedside.    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    Obesity     Prediabetes        Past Surgical History:   Procedure Laterality Date    ABDOMINAL WALL MESH  REMOVAL  1997, 2005    Placement, 1997, removal and replacement 2005    BLADDER SURGERY      bladder tact X2    BREAST BIOPSY Right 04/2013    benign    BREAST EXCISIONAL BIOPSY Right 05/2013    benign    BREAST SURGERY Right     precancerous spot removed    CHOLECYSTECTOMY      HYSTERECTOMY      IR EMBOLIZATION VENOUS OTHER THAN HEMORRHAGE  02/17/2023    IR EMBOLIZATION VENOUS OTHER THAN HEMORRHAGE 02/17/2023 Ammie Dalton, MD IMG VIR [facility]    PR COLSC FLX W/RMVL OF TUMOR POLYP LESION SNARE TQ N/A 10/14/2016    Procedure: COLONOSCOPY FLEX; W/REMOV TUMOR/LES BY SNARE;  Surgeon: Alfred Levins, MD;  Location: [facility] PROCEDURES [place];  Service: Gastroenterology    PR COLSC FLX W/RMVL OF TUMOR POLYP LESION SNARE TQ N/A 01/15/2023    Procedure: COLONOSCOPY FLEX; W/REMOV TUMOR/LES BY SNARE;  Surgeon: Mickie Hillier, MD;  Location: [facility];  Service: Gastroenterology    PR PERC CLOS,CONG INTERATRIAL COMMUN W/IMPL N/A 09/12/2022    Procedure: ASD closure;  Surgeon: Jacquelyne Balint, MD;  Location: [facility] C[facility];  Service: Cardiology    PR UPPER GI ENDOSCOPY,BIOPSY N/A 01/15/2023    Procedure: UGI ENDOSCOPY; WITH BIOPSY, SINGLE OR MULTIPLE;  Surgeon: Mickie Hillier, MD;  Location: [facility];  Service: Gastroenterology    SKIN BIOPSY           Current Facility-Administered Medications:     octreotide (SandoSTATIN) injection 100 mcg, 100 mcg, Intravenous, Once, Henreitta Leber, PA    octreotide 500 mcg in sodium chloride 0.9 % 100 mL (5 mcg/mL) infusion, 50 mcg/hr, Intravenous, Continuous, Henreitta Leber, [place]    Current Outpatient Medications:     atorvastatin (LIPITOR) 20 MG tablet, Take 1 tablet (20 mg total) by mouth daily., Disp: 90 tablet, Rfl: 3    blood sugar diagnostic (GLUCOSE BLOOD) Strp, Use to test blood sugar daily, Disp: 100 strip, Rfl: 3    blood-glucose meter kit, Use to test blood sugar DAILY, Disp: 1 each, Rfl: 1    carvedilol (COREG) 6.25 MG tablet, Take 1 tablet (6.25 mg total) by mouth two (2) times a day. Please add to pill pack, Disp: 60 tablet, Rfl: 11    cholecalciferol, vitamin D3, 2,000 unit cap, Take 2 capsules (4,000 Units total) by mouth daily., Disp: 1 each, Rfl: 0    DAILY-VITE, WITH FOLIC ACID, 400 mcg Tab tablet, Take 1 tablet by mouth daily., Disp: , Rfl:     escitalopram oxalate (LEXAPRO) 10 MG tablet, Take 1 tablet (10 mg total) by mouth daily., Disp: 90 tablet, Rfl: 3  furosemide (LASIX) 80 MG tablet, Take 1 tablet (80 mg total) by mouth daily. Please add to pill packs, Disp: 90 tablet, Rfl: 0    inhalational spacing device (AEROCHAMBER MV) Spcr, Use as directed with inhalers, Disp: 1 each, Rfl: 0    lancets Misc, Use to test blood sugar daily, Disp: 100 each, Rfl: 3    loratadine (CLARITIN) 10 mg tablet, Take 1 tablet (10 mg total) by mouth daily., Disp: 90 tablet, Rfl: 1    magnesium oxide (MAG-OX) 400 mg (241.3 mg elemental magnesium) tablet, Take 1 tablet (400 mg total) by mouth daily., Disp: 90 tablet, Rfl: 3    metFORMIN (GLUCOPHAGE-XR) 500 MG 24 hr tablet, Take 1 tablet (500 mg total) by mouth daily with evening meal., Disp: 90 tablet, Rfl: 3    multivitamin (TAB-A-VITE/THERAGRAN) per tablet, Take 1 tablet by mouth daily., Disp: 90 tablet, Rfl: 3    naltrexone (DEPADE) 50 mg tablet, Take 1 tablet (50 mg total) by mouth daily. Please add to pill packs, Disp: 30 tablet, Rfl: 11    omeprazole (PRILOSEC) 20 MG capsule, Take 1 capsule (20 mg total) by mouth Two (2) times a day (30 minutes before a meal)., Disp: 180 capsule, Rfl: 3    psyllium seed, with sugar, (FIBER ORAL), Take 4 capsules by mouth daily. [employer]Js brand., Disp: , Rfl:     rifAXIMin (XIFAXAN) 550 mg Tab, Take 1 tablet (550 mg total) by mouth two (2) times a day., Disp: 180 tablet, Rfl: 3    semaglutide (OZEMPIC) 2 mg/dose (8 mg/3 mL) PnIj, Inject 2 mg under the skin every seven (7) days. (Patient not taking: Reported on 08/12/2023), Disp: 9 mL, Rfl: 3    spironolactone (ALDACTONE) 50 MG tablet, Take 1 tablet (50 mg total) by mouth daily., Disp: 90 tablet, Rfl: 3    Allergies  Penicillins, Shellfish containing products, Glipizide, and Lisinopril    Family History   Problem Relation Age of Onset    Heart disease Mother     Hypertension Mother     COPD Mother     Heart disease Father     Kidney disease Father     Allergies Father     Gout Father     Diabetes Father     Allergies Other         FAMILY H/O    Coronary artery disease Other         FAMILY H/O    Kidney failure Other         FAMILY H/O    No Known Problems Sister     No Known Problems Daughter     No Known Problems Maternal Grandmother     No Known Problems Maternal Grandfather     No Known Problems Paternal Grandmother     No Known Problems Paternal Grandfather     Alcohol abuse Maternal Uncle     Birth defects Son     Birth defects Son     BRCA 1/2 Neg Hx     Breast cancer Neg Hx     Cancer Neg Hx     Colon cancer Neg Hx     Endometrial cancer Neg Hx     Ovarian cancer Neg Hx     Substance Abuse Disorder Neg Hx     Mental illness Neg Hx        Social History     Tobacco Use    Smoking status: Never     Passive exposure: Past  Smokeless tobacco: Never   Vaping Use    Vaping status: Never Used   Substance Use Topics    Alcohol use: No    Drug use: No          Physical Exam     ED Triage Vitals [08/13/23 1131]   Enc Vitals Group      BP 123/79      Heart Rate 81      SpO2 Pulse       Resp 17      Temp 36.1 ??C (96.9 ??F)      Temp Source Skin      SpO2 100 %      Weight 98.4 kg (217 lb)      Height       Head Circumference       Peak Flow       Pain Score       Pain Loc       Pain Education       Exclude from Growth Chart        Constitutional: Lethargic but oriented. Well appearing and in no distress.  Eyes: Conjunctivae are normal.  Hematological/Lymphatic/Immunilogical: No cervical lymphadenopathy.  Cardiovascular: Normal rate, regular rhythm. Normal and symmetric distal pulses are present in all extremities.  Respiratory: Normal respiratory effort. Breath sounds are normal.  Gastrointestinal: Soft and nontender. Guaiac negative. No fissures or hemorrhoids noted.  Neurologic: Normal speech and language. No gross focal neurologic deficits are appreciated.  Skin: Skin is warm, dry and intact. No rash noted.  Psychiatric: Mood and affect are normal. Speech and behavior are normal.       Radiology     CTA Abdomen Pelvis Gi Bleed   Final Result   -- No evidence of active gastrointestinal hemorrhage at the time of the examination.      --Mild peripancreatic stranding which is nonspecific and could be related to portal hypertension or acute pancreatitis. Clinical correlation is recommended.      --Hepatic cirrhosis with sequela of portal hypertension including mild splenomegaly, upper abdominal and esophageal varices, and small volume ascites.      -- Additional chronic and incidental findings as described in report findings.          I supervised care provided by the PA student.  We have discussed the case, I have reviewed the note and I agree with the plan of treatment.    I personally interviewed the patient and examined the patient.       Margaret Pyle [place], [place]  08/13/23 1843

## 2023-08-13 NOTE — Unmapped (Signed)
Received notification this morning from patient's local PCP that she would like to discuss her care.  When I called her back, she reported that the patient had AMS and black stools x 7 days that has slowed down in the last day or so.  Labs show drop in platelet count and Hgb. Offered PCP the on call liver transplant coordinator phone number for after hour needs in the future.    Spoke with patient's daughter and asked them to head to the emergency department.  She was at work and had to leave to drive to her mom's house and pick her up, but was agreeable to plan to bring her to the ED for workup.

## 2023-08-13 NOTE — Unmapped (Signed)
Emergency Medicine Access Physician [facility])  [facility] - Transfer Request Note    Requesting Provider: Regan Lemming    Requesting Hospital: [facility]    Requesting Service: Emergency Medicine        Reason for transfer request: gi bleed    Overview of ED course at transferring hospital: Denise York is a 58 y.o. female with history of MASH cirrhosis presents with several days of melena. No hematemesis.  Hgb 11---->9today.  GI was consulted at [facility] and they have concern for possible variceal bleed and feel she would be best served by being at [facility] in case her condition were to worsen overnight.     She has received CTX/Protonix/Octreotide gtt.     Pt is awake and alert.         Was the patient accepted to the [facility] ED in transfer: Yes.  If no, why?: N/A    Accepting service/expected service information:  Has an inpatient or consulting service been contacted by the [facility], [facility] or the referring provider?: Yes.  If admitting/consult service contacted by [facility]:  Time of discussion: Documented above in overview section  Name/service (e.g. Dr. Floyde Parkins, neprology) of inpatient team member/consultant: Documented above in overview section  Role of admit/consult service member: Documented above in overview section  Brief summary of call: Documented above in overview section  Anticipated Inpatient Bed Type Needed: To be determined    Outside Hospital Imaging:  Was Imaging Done at the Referring Hospital:  Yes.  If yes, what studies?:  ct abdomen  [employer]:  [employer], images available in [employer]

## 2023-08-13 NOTE — Unmapped (Signed)
ED Procedure Note    Procedures

## 2023-08-13 NOTE — Unmapped (Signed)
ED Progress Note    This is a 58 year old female PMHx viral hepatitis, NAFLD, hepatic encephalopathy, CHF, DM type II who is presenting as a transfer from [facility] for GIB.    # C/f GI bleed  # Melena, decreased Hgb  Patient reports melena for about a week. Family notes the patient has been more confused, which is consistent with her prior episodes of hepatic encephalopathy. At [facility] ED, hemoglobin found to be decreased to baseline at 9.1. CT abdomen/pelvis without active GI hemorrhage, portal hypertension noted with upper abdominal and esophageal varices.  -S/p ceftriaxone 1 g  -Octreotide 500 mcg gtt  -Continue IV PPI BID  -Continue home lactulose  -Plan for admission    Objective:  Vitals:    08/13/23 1800   BP: 125/53   Pulse:    Resp:    Temp:    SpO2: 99%     Constitutional: Lethargic but oriented. Well appearing and in no distress.  Eyes: Conjunctivae are normal.  Hematological/Lymphatic/Immunilogical: No cervical lymphadenopathy.  Cardiovascular: Normal rate, regular rhythm. Systolic murmur. Normal and symmetric distal pulses are present in all extremities.  Respiratory: Normal respiratory effort. Breath sounds are normal.  Gastrointestinal: Diffuse distention, soft and nontender to palpation  Neurologic: Asterixis present. Speech slow and delayed, however Aox3. No focal deficit  Skin: Skin is warm, dry and intact. No rash noted.  Psychiatric: Mood and affect are normal. Speech and behavior are normal.

## 2023-08-13 NOTE — Unmapped (Signed)
Pt takes lactulose at [facility], has skipped ~4 days d/t diarrhea.

## 2023-08-13 NOTE — Unmapped (Signed)
Internal Medicine (MEDW) History & Physical    Assessment & Plan:   Denise York is a 58 y.o. female whose presentation is complicated by cirrhosis secondary to NAFLD with history of hepatic encephalopathy, viral hepatitis, CHF, DM II presented to [facility] as a transfer from [facility] for melena c/f UGI vs variceal bleed.     Active Problems:    * No active hospital problems. *        Active Problems    Melena in [facility] Cirrhosis c/f UGI vs Variceal Bleed - Acute Blood Loss Anemia - Hepatic Encephalopathy  Pt presenting w/hepatic encephalopathy iso melena for one week and stopping lactulose. Initially presented to [facility], s/p CTA A/P GI bleed protocol w/o active GI bleeding, rocephin, octreotide, lactulose. Seen by GI w/recommendation for transfer to [facility]. Notably on transfer to [facility], patient having multiple bowel movements without blood in setting of lactulose administration.  Overall, she is hemodynamically stable. UA w/o evidence of infection, remainder of infectious workup pending. Will follow recommendations from GI as below with plan for likely procedure in AM.  - GI following from [facility], page in AM  - q8 CBC  - daily hepatic function panel, BMP, PT-INR  - clear liquid diet, then NPO at MN in case of procedure  - continue octreotide gtt overnight  - protonix 40 mg IV BID   - continue lactulose 20 g TID, goal 3-5 BM's  - continue rifaximin 550 mg BID  - continue CTX 1g daily  - Hold home Lasix, spironolactone, carvedilol in setting of acute bleed  - nutrition c/s in AM  - PT/OT c/s  - maintain active type and screen  - monitor on telemetry  - follow ferritin, folate, B12 for additional contributor to anemia    Chronic Problems  HLD: continue home atorvastatin 20 mg daily  T2DM: SSI ordered  Depression: continue home lexapro 10 mg daily    The patient's presentation is complicated by the following clinically significant conditions requiring additional evaluation and treatment: - Thrombocytopenia POA requiring further investigation or monitor  - Metabolic Encephalopathy POA requiring further investigation or monitoring       Checklist:  Diet: NPO  DVT PPx: Contraindicated - High Risk for Bleeding/Active Bleeding  Code Status: Full Code  Dispo: Patient appropriate for  ??Inpatient based on expectation of ongoing need for hospitalization greater than two midnights and severity of presentation/services including acute blood loss anemia in setting of cirrhosis of the liver with known varices    Team Contact Information:   Primary Team: Internal Medicine (MEDW)  Primary Resident: Cristy Hilts, MD  Resident's Pager: [phone] [facility])    Chief Concern:   No Principal Problem: There is no principal problem currently on the Problem List. Please update the Problem List and refresh.      Subjective:   Denise York is a 58 y.o. female with pertinent PMHx of cirrhosis secondary to NAFLD with history of hepatic encephalopathy, viral hepatitis, CHF, DM II presented to [facility] as a transfer from [facility] for melena c/f UGI vs variceal bleed.       History obtained by patient.       HPI:  Pt feeling improved since starting lactulose. No nausea, vomiting, abdominal pain. No acute concerns or questions.      Pertinent Surgical Hx  Per chart, no new surgical history.    Pertinent Family Hx  Per chart, no new family history.     Pertinent Social Hx  Pt denies tobacco, alcohol or other drug use. Lives at home with her husband.     Allergies  Penicillins, Shellfish containing products, Glipizide, and Lisinopril    Recommend full med rec in the AM 2/2 patient's altered mental status.  Prior to Admission medications    Medication Dose, Route, Frequency   atorvastatin (LIPITOR) 20 MG tablet 20 mg, Oral, Daily (standard)   blood sugar diagnostic (GLUCOSE BLOOD) Strp Use to test blood sugar daily   blood-glucose meter kit Use to test blood sugar DAILY   carvedilol (COREG) 6.25 MG tablet 6.25 mg, Oral, 2 times a day (standard), Please add to pill pack   cholecalciferol, vitamin D3, 2,000 unit cap 2 capsules, Oral, Daily (standard)   DAILY-VITE, WITH FOLIC ACID, 400 mcg Tab tablet 1 tablet, Daily (standard)   escitalopram oxalate (LEXAPRO) 10 MG tablet 10 mg, Oral, Daily (standard)   furosemide (LASIX) 80 MG tablet 80 mg, Oral, Daily (standard), Please add to pill packs   inhalational spacing device (AEROCHAMBER MV) Spcr Use as directed with inhalers   lancets Misc Use to test blood sugar daily   loratadine (CLARITIN) 10 mg tablet 10 mg, Oral, Daily (standard)   magnesium oxide (MAG-OX) 400 mg (241.3 mg elemental magnesium) tablet 400 mg, Oral, Daily (standard)   metFORMIN (GLUCOPHAGE-XR) 500 MG 24 hr tablet 500 mg, Oral, Daily   multivitamin (TAB-A-VITE/THERAGRAN) per tablet 1 tablet, Oral, Daily (standard)   naltrexone (DEPADE) 50 mg tablet 50 mg, Oral, Daily (standard), Please add to pill packs   omeprazole (PRILOSEC) 20 MG capsule 20 mg, Oral, 2 times a day (AC)   psyllium seed, with sugar, (FIBER ORAL) 4 capsules, Daily (standard)   rifAXIMin (XIFAXAN) 550 mg Tab 550 mg, Oral, 2 times a day (standard)   semaglutide (OZEMPIC) 2 mg/dose (8 mg/3 mL) PnIj 2 mg, Subcutaneous, Every 7 days  Patient not taking: Reported on 08/12/2023   spironolactone (ALDACTONE) 50 MG tablet 50 mg, Oral, Daily (standard)       Designated Healthcare Decision Maker:  Denise York currently has decisional capacity for healthcare decision-making and is able to designate a surrogate healthcare decision maker. Denise York designated healthcare decision maker(s) is/are Denise York (the patient's spouse) as denoted by stated patient preference.    Objective:   Physical Exam:  Temp:  [36.1 ??C (96.9 ??F)-36.7 ??C (98 ??F)] 36.6 ??C (97.9 ??F)  Heart Rate:  [74-83] 74  SpO2 Pulse:  [72-81] 75  Resp:  [17-20] 20  BP: (108-130)/(51-92) 108/52  SpO2:  [98 %-100 %] 99 %    Gen: NAD, converses   Eyes: Sclera anicteric, EOMI grossly normal   HENT: Atraumatic, normocephalic  Neck: Trachea midline  Heart: RRR  Lungs: CTAB, no crackles or wheezes  Abdomen: Soft, NTND  Extremities: No edema  Neuro: Grossly symmetric, non-focal; no asterixis present  Skin:  No rashes, lesions on clothed exam  Psych: Alert, oriented    Alec Jaros Laurena Bering, MD  Internal Medicine PGY-3

## 2023-08-13 NOTE — Unmapped (Signed)
Bed: 01-B  Expected date:   Expected time:   Means of arrival:   Comments:  [facility] transfer

## 2023-08-13 NOTE — Unmapped (Signed)
[facility] Gastroenterology Consult Service   Initial Consultation         Assessment and Recommendations:   Denise York is a 58 y.o. 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 The patient is seen in consultation at the request of Henreitta Leber, PA (Emergency Medicine) for melena with concern for variceal bleed.    Melena - MASLD Cirrhosis c/b ascites, HE, and EV  - MELD 3.0: 14  Denise York is presenting today with three days of black tarry stools.  She presented to the [facility] ED hemodynamically stable with a hemoglobin of 9.1 from baseline of 10-11.  She has not required a transfusion.  Her last melenic stool was yesterday and she had small pieces of brown stool on my rectal exam today. The differential for her melena includes bleeding esophageal varices, bleeding gastric varices, and portal hypertensive gastropathy. Other potential etiologies include peptic ulcer disease, esophagitis, gastritis, GAVE, among others. Her last upper endoscopy 01/2023 showed grade 1 esophageal varices and superficial gastric ulcers and mild portal hypertensive gastropathy. Her esophageal varices may have enlarged since undergoing embolization of splenorenal shunt 02/2023. We recommend an EGD for further evaluation and ongoing management of presumed variceal bleed until proven otherwise.  We recommend a full infectious workup to evaluate the etiology of her decompensation. Patient should be transferred to a step down bed at [facility] for ongoing care by the Hepatology team.    Hepatic Encephalopathy   Denise York has a history of hepatic encephalopathy now status post splenorenal shunt embolization with some improvement in her hepatic encephalopathy.  She stopped taking her lactulose last week after developing multiple watery bowel movements.  On exam today, she is slow to respond, occasionally loses her train of thought, and has asterixis. She is AOX3.  She and her sister report some improvement after the dose of lactulose that was given in the ED.  Plan to continue this and restart rifaximin with the goal of 3-5 bowel movements per day.    Recommendations:   -- Clear liquid diet for now and NPO at midnight for potential EGD 2/6  -- Continue Ceftriaxone 1 gram daily   -- Continue octreotide gtt   -- Continue BID PPI   -- Continue lactulose 20 grams TID titrated to 3-4 BM/day and rifaximin 550 BID  -- Hold Coreg in the setting of GIB  -- Hold lasix and spironolactone in the setting of GIB  -- Initiate full infectious workup including CXR, U/A with reflex culture, blood cultures, and RPP  -- Trend hemoglobin every 6-8 hours   -- Maintain active type and screen    Cirrhosis secondary to  MASLD , decompensated, complicated by encephalopathy, non-bleeding varices, ascites.   Primary hepatologist: Dr. Eudelia Bunch.  Encephalopathy: Continue lactulose and rifaximin  Varices: Hold Coreg iso GIB  Ascites: Hold lasix 80 mg daily and spironolactone 50 mg daily iso GIB  Renal function: Current Cr: 0.94 (baseline 0.90)  Infection: Infectious workup as above   HCC screening: Negative MRI 06/08/2023  Transplant: MELD remains too low for deceased donor transplant. Living donor transplant has been discussed and her sisters have expressed interest in donating.  Per committee review on 07/16/2023, patient was referred to  allergy for penicillin allergy testing and consider ID or urogyn referral for recurrent UTI.  Vaccines: UTD    Issues Impacting Complexity of Management:  -None    Patient was seen and discussed with Dr. Stevphen Rochester. Discussed with Dr. Sherryll Burger (Hepatology). Recommendations discussed with the patient's primary team. We will continue to follow along with you.    I saw and evaluated the patient, participating in the key portions of the service.  I reviewed the resident???s note.  I agree with the resident???s findings and plan. [name], MD, PhD         History:   Denise York is presenting to the [facility] ED with melena for the past 3 days.  Prior to this, she had multiple episodes of watery stools which prompted her to stop her lactulose.  Her last dose of lactulose was last week.  She thinks her last episode of black stools was yesterday, though she is not quite sure. She denies bright red blood per rectum and hematemesis.  She does not take NSAIDs, iron supplementation or Pepto-Bismol.  She has never had anything like this before. She denies worsening abdominal distention, but does say that her lower extremities appear more edematous than usual.  Patient's sister is at bedside and says that she cognitively seems slower compared to normal.    Since admission, patient has been afebrile and HD stable (HR: 81, BO: 123/79).  CBC notable for hemoglobin 9.1 from baseline 10-11 and platelets 37 down from baseline in the 50s. CMP notable for gluose 243, albmun 2.6. Total bilirubin: 1.6 from 2.3, AST: 54, ALT: 38, Alk phos: 100. Lipase 57. Ammonia 161. INR: 1.32. CT a/p GI bleed protocol showed no evidence of active GI hemorrhage.  Mild peripancreatic stranding possibly related to portal hypertension or acute pancreatitis.  Findings of hepatic cirrhosis with sequela of portal hypertension with mild splenomegaly upper abdominal and esophageal varices and small volume ascites.  Since being in the ED, she has received ceftriaxone, octreotide, and pantoprazole and lactulose.    Patient follows with Dr. Eudelia Bunch for her history of MASLD cirrhosis, last seen 07/14/2023.  At that time, she was undergoing liver transplant evaluation for potential living donor transplant.  Per committee review on 07/16/2023, patient was referred to allergy to evaluate her penicillin allergy and possibly ID or urogyn for recurrent UTI.  Her last EGD 01/2023 showed grade I varices in the lower third of the esophagus, few non-bleeding superficial gastric ulcers (Forrect Class III) in gastric antrum. Mild portal hypertensive gastropathy, normal duodenum.     Social History: [place].  She denies alcohol, drug, and tobacco use.   Past Medical History: As above  Surgical History: Cholecystectomy and bladder surgeries  Family History: No family history of colon cancers or liver disease.    -I have reviewed the patient's prior records from [facility] as summarized in the HPI    Objective:   Temp:  [36.1 ??C (96.9 ??F)] 36.1 ??C (96.9 ??F)  Heart Rate:  [81] 81  Resp:  [17] 17  BP: (123)/(79) 123/79  SpO2:  [100 %] 100 %    Gen: Well appearing female in NAD, sister at bedside  Eyes: Sclera anicteric, EOMI  HENT: Atraumatic, normocephalic  Lungs: No use of accessory muscles  Abdomen: Normoactive bowel sounds, soft, NTND, no rebound/guarding  Rectal: No stool in the rectal vault, small pieces of brown stool and mucous on DRE   Neuro: CN II-XI grossly intact,  asterixis present, slow to respond to questions and frequently loses train of thought  Skin:  No rashes, lesions on clothed exam    Pertinent Labs/Studies:  -I have reviewed the patient's labs from 08/13/23 which show down-trending Hgb, stable renal function (SCr, electrolytes), and stable LFTs

## 2023-08-14 LAB — CBC
HEMATOCRIT: 24.7 % — ABNORMAL LOW (ref 34.0–44.0)
HEMATOCRIT: 26.1 % — ABNORMAL LOW (ref 34.0–44.0)
HEMATOCRIT: 26.4 % — ABNORMAL LOW (ref 34.0–44.0)
HEMOGLOBIN: 8.1 g/dL — ABNORMAL LOW (ref 11.3–14.9)
HEMOGLOBIN: 8.6 g/dL — ABNORMAL LOW (ref 11.3–14.9)
HEMOGLOBIN: 8.9 g/dL — ABNORMAL LOW (ref 11.3–14.9)
MEAN CORPUSCULAR HEMOGLOBIN CONC: 32.8 g/dL (ref 32.0–36.0)
MEAN CORPUSCULAR HEMOGLOBIN CONC: 33.1 g/dL (ref 32.0–36.0)
MEAN CORPUSCULAR HEMOGLOBIN CONC: 33.5 g/dL (ref 32.0–36.0)
MEAN CORPUSCULAR HEMOGLOBIN: 27.2 pg (ref 25.9–32.4)
MEAN CORPUSCULAR HEMOGLOBIN: 27.4 pg (ref 25.9–32.4)
MEAN CORPUSCULAR HEMOGLOBIN: 27.9 pg (ref 25.9–32.4)
MEAN CORPUSCULAR VOLUME: 83.1 fL (ref 77.6–95.7)
MEAN CORPUSCULAR VOLUME: 83 fL (ref 77.6–95.7)
MEAN CORPUSCULAR VOLUME: 83.3 fL (ref 77.6–95.7)
MEAN PLATELET VOLUME: 11.9 fL — ABNORMAL HIGH (ref 6.8–10.7)
MEAN PLATELET VOLUME: 12 fL — ABNORMAL HIGH (ref 6.8–10.7)
MEAN PLATELET VOLUME: 11.2 fL — ABNORMAL HIGH (ref 6.8–10.7)
PLATELET COUNT: 30 10*9/L — ABNORMAL LOW (ref 150–450)
PLATELET COUNT: 30 10*9/L — ABNORMAL LOW (ref 150–450)
PLATELET COUNT: 32 10*9/L — ABNORMAL LOW (ref 150–450)
RED BLOOD CELL COUNT: 2.97 10*12/L — ABNORMAL LOW (ref 3.95–5.13)
RED BLOOD CELL COUNT: 3.15 10*12/L — ABNORMAL LOW (ref 3.95–5.13)
RED BLOOD CELL COUNT: 3.17 10*12/L — ABNORMAL LOW (ref 3.95–5.13)
RED CELL DISTRIBUTION WIDTH: 15.4 % — ABNORMAL HIGH (ref 12.2–15.2)
RED CELL DISTRIBUTION WIDTH: 15.1 % (ref 12.2–15.2)
RED CELL DISTRIBUTION WIDTH: 15.4 % — ABNORMAL HIGH (ref 12.2–15.2)
WBC ADJUSTED: 2.7 10*9/L — ABNORMAL LOW (ref 3.6–11.2)
WBC ADJUSTED: 2.9 10*9/L — ABNORMAL LOW (ref 3.6–11.2)
WBC ADJUSTED: 3.1 10*9/L — ABNORMAL LOW (ref 3.6–11.2)

## 2023-08-14 LAB — BASIC METABOLIC PANEL
ANION GAP: 10 mmol/L (ref 5–14)
BLOOD UREA NITROGEN: 13 mg/dL (ref 9–23)
BUN / CREAT RATIO: 13
CALCIUM: 9.3 mg/dL (ref 8.7–10.4)
CHLORIDE: 105 mmol/L (ref 98–107)
CO2: 25 mmol/L (ref 20.0–31.0)
CREATININE: 0.97 mg/dL (ref 0.55–1.02)
EGFR CKD-EPI (2021) FEMALE: 68 mL/min/{1.73_m2} (ref >=60)
GLUCOSE RANDOM: 126 mg/dL (ref 70–179)
POTASSIUM: 3.8 mmol/L (ref 3.5–5.1)
SODIUM: 140 mmol/L (ref 135–145)

## 2023-08-14 LAB — PROTIME-INR
INR: 1.33
PROTIME: 15.2 s — ABNORMAL HIGH (ref 9.9–12.6)

## 2023-08-14 LAB — IRON PANEL
IRON SATURATION: 6 % — ABNORMAL LOW (ref 20–55)
IRON: 20 ug/dL — ABNORMAL LOW (ref 50–170)
TOTAL IRON BINDING CAPACITY: 350 ug/dL (ref 250–425)

## 2023-08-14 LAB — VITAMIN B12: VITAMIN B-12: 1051 pg/mL — ABNORMAL HIGH (ref 211–911)

## 2023-08-14 LAB — FOLATE: FOLATE: 17 ng/mL (ref >=5.4)

## 2023-08-14 MED ADMIN — pantoprazole (Protonix) injection 40 mg: 40 mg | INTRAVENOUS | @ 14:00:00

## 2023-08-14 MED ADMIN — atorvastatin (LIPITOR) tablet 20 mg: 20 mg | ORAL | @ 14:00:00

## 2023-08-14 MED ADMIN — peg-electrolyte soln (GoLYTELY) solution 4,000 mL: 4000 mL | ORAL | @ 22:00:00 | Stop: 2023-08-14

## 2023-08-14 MED ADMIN — rifAXIMin (XIFAXAN) tablet 550 mg: 550 mg | ORAL | @ 14:00:00 | Stop: 2023-08-23

## 2023-08-14 MED ADMIN — Propofol (DIPRIVAN) injection: INTRAVENOUS | @ 16:00:00 | Stop: 2023-08-14

## 2023-08-14 MED ADMIN — octreotide 500 mcg in sodium chloride 0.9 % 100 mL (5 mcg/mL) infusion: 50 ug/h | INTRAVENOUS | @ 10:00:00

## 2023-08-14 MED ADMIN — propofol (DIPRIVAN) infusion 10 mg/mL: INTRAVENOUS | @ 16:00:00 | Stop: 2023-08-14

## 2023-08-14 MED ADMIN — octreotide (SandoSTATIN) injection 100 mcg: 100 ug | INTRAVENOUS | Stop: 2023-08-13

## 2023-08-14 MED ADMIN — escitalopram oxalate (LEXAPRO) tablet 10 mg: 10 mg | ORAL | @ 14:00:00

## 2023-08-14 MED ADMIN — octreotide 500 mcg in sodium chloride 0.9 % 100 mL (5 mcg/mL) infusion: 50 ug/h | INTRAVENOUS

## 2023-08-14 MED ADMIN — sodium ferric gluconate (FERRLECIT) 250 mg in sodium chloride (NS) 0.9 % 100 mL IVPB: 250 mg | INTRAVENOUS | @ 19:00:00 | Stop: 2023-08-17

## 2023-08-14 MED ADMIN — lactulose (CEPHULAC) packet 20 g: 20 g | ORAL | @ 19:00:00

## 2023-08-14 MED ADMIN — cefTRIAXone (ROCEPHIN) 1 g in sodium chloride 0.9 % (NS) 100 mL IVPB-MBP: 1 g | INTRAVENOUS | @ 15:00:00 | Stop: 2023-08-14

## 2023-08-14 MED ADMIN — acetaminophen (TYLENOL) tablet 650 mg: 650 mg | ORAL | @ 22:00:00

## 2023-08-14 MED ADMIN — acetaminophen (TYLENOL) tablet 650 mg: 650 mg | ORAL | @ 07:00:00

## 2023-08-14 MED ADMIN — lidocaine (PF) (XYLOCAINE-MPF) 20 mg/mL (2 %) injection: INTRAVENOUS | @ 16:00:00 | Stop: 2023-08-14

## 2023-08-14 MED ADMIN — rifAXIMin (XIFAXAN) tablet 550 mg: 550 mg | ORAL | @ 02:00:00 | Stop: 2023-08-23

## 2023-08-14 MED ADMIN — ondansetron (ZOFRAN) injection 4 mg: 4 mg | INTRAVENOUS | Stop: 2023-08-13

## 2023-08-14 MED ADMIN — sodium chloride (NS) 0.9 % infusion: INTRAVENOUS | @ 16:00:00 | Stop: 2023-08-14

## 2023-08-14 MED ADMIN — fentaNYL (PF) (SUBLIMAZE) injection: INTRAVENOUS | @ 16:00:00 | Stop: 2023-08-14

## 2023-08-14 MED ADMIN — atorvastatin (LIPITOR) tablet 20 mg: 20 mg | ORAL | @ 02:00:00

## 2023-08-14 NOTE — Unmapped (Signed)
Hepatology Consult Service   Progress Note         Assessment and Recommendations:   Denise York is a 58 y.o. female with a PMHx of MASH cirrhosis, hepatic encephalopathy, esophageal and gastric varices, and T2DM who presented to [facility] with melena. The patient is seen in consultation at the request of Deeann Cree, MD (Med [facility] (MDW)) for melena.    Melena  Acute on Chronic Normocytic Anemia   MASH Cirrhosis - MELD 3.0 14  Grade I Esophageal and Gastric Varices  Discussion: Initial presentation with 4 days of dark stool, fecal incontinence, and diarrhea thought to be 2/2 to lactulose so lactulose was discontinued- subsequently developed confusion. Hgb 8.1, down from baseline of 11-12. Not on blood thinners or antiplatelet. [place] 10. Splenorenal shunt 2024 may exacerbate varices. Without melena since admission but persistent loose stool/diarrhea with history of what sounds to be IBS-D. Ferritin 11, down from prior 23.     Recommendations:   -Proceed with inpatient EGD this morning/afternoon 2/6 -> no findings of varices or bleeding. Will proceed with colonoscopy +/- capsule 2/7  -NPO after midnight 2/6  -Continue rocephin for 5 days (2/5-2/9) or until discharge  -Holding diuretics and beta blocker for acute bleed  -Can stop octreotide   -Further Fe studies to assess need for Fe transfusion/supplementation, noted ferritin 11  -Maintain active type and screen   -Restrictive transfusion protocol for hgb <7    Grade I, C, Hepatic Encephalopathy   Discussion: Symptoms of word finding difficulties and brain fog. 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 Recently stopped lactulose for worsening diarrhea over the last couple weeks after stopping her Ozempic.   Recommendations:   -Continue lactulose 20g TID for goal 3 BM/day, reduce for diarrhea  -Continue home rifaximin 550mg  BID      Thrombocytopenia   Discussion: Baseline plt 30-50s. Does not appear to be actively bleeding at this time making plt transfusion threshold between 20-30. Hold on transfusion at this time, may reconsider pending EGD findings or interventions.     Recommendations:   -Transfuse for plt <20. If obvious active bleed with change in hemodynamics/hematemesis/melena, follow usual transfusion guidelines of transfusion for plt <50     GI Pre-Procedure Checklist  Procedure: Upper Endoscopy  Anticipated Date of Procedure: 02/06  Anticoagulants/Antiplatelets: Hold DVT prophylaxis day of procedure  Anesthesia Concerns: None  Diet: Keep patient NPO    Issues Impacting Complexity of Management:  -None    Recommendations discussed with the patient's primary team. We will continue to follow along with you.    History:   No acute events overnight. Did have an episode of mushy stool and incontinence last night after lactulose but this was without melena or hematochezia. She has no concerns of prior iron deficiency or hematochezia at home. She continues to have brain fog and tremor. She is concerned about continuing to miss her lactulose. She has some mild swelling of LE but no abd swelling, nausea, vomiting, abd pain, acid reflux. Asking when she can eat.       Objective:   Temp:  [35.7 ??C (96.3 ??F)-36.7 ??C (98 ??F)] 35.7 ??C (96.3 ??F)  Heart Rate:  [74-83] 74  SpO2 Pulse:  [72-81] 75  Resp:  [17-20] 20  BP: (108-130)/(51-92) 113/54  SpO2:  [98 %-100 %] 99 %    General: Well appearing in NAD, lying comfortably, sister on the phone during conversation  CV: RRR without murmurs, trace bilat LE edema to knees  Resp: CTAB  without focal findings  Abd: Soft, nontender, nondistended  Skin: No jaundice  Neuro: Asterixis and tremor present    Pertinent Labs/Studies:  -I have reviewed the patient's labs from 08/14/23 which show  Worsening hgb 8.1  Plt 30  Bili 1.6, ALT 38, AST 54, ALP 100  Alb 2.6  Ferritin 11.6

## 2023-08-14 NOTE — Unmapped (Signed)
PHYSICAL THERAPY  Evaluation (08/14/23 0844)      Patient Name:  Denise York       Medical Record Number: [id number]   Date of Birth: 1966/02/12  Sex: Female        Post-Discharge Physical Therapy Recommendations:  PT Post Acute Discharge Recommendations: Skilled PT services NOT indicated   Equipment Recommendation  PT DME Recommendations: None          Treatment Diagnosis: Difficulty in walking        Activity Tolerance: Tolerated treatment well     ASSESSMENT         Assessment : 58 y.o. female whose presentation is complicated by cirrhosis secondary to NAFLD with history of hepatic encephalopathy, viral hepatitis, CHF, DM II presented to [facility] as a transfer from [facility] for melena c/f UGI vs variceal bleed. Pt presents to PT eval at her functional baseline. She demos independence with all mobility, including bed mobility, transfers, and household ambulation. As such, she has no further skilled PT needs while she remains in house. Recommend pt ambulate 3-5x/day while in the hospital to prevent deconditioning. Will sign off at this time.      Today's Interventions: PT eval     Personal Factors/Comorbidities Present: No personal factors   Examination of Body System: Musculoskeletal  Clinical Presentation: Stable    Clinical Decision Making: Low        PLAN  Planned Frequency of Treatment: Plan of Care Initiated: 08/14/23  D/C Services Weekly Frequency: D/C Services         Goals:   Patient and Family Goals: Return home    Prognosis:  Good    SUBJECTIVE  Communication Preference: Verbal,     Patient reports: I walk fine, I'll get up and show you.        Prior Functional Status: Independent with all mobility, denies falls, doesn't drive, husband is in a wheelchair but assists pt as needed throughout the day. Husband does all the cooking, cleaning and grocery shopping. Pt does report that she trips sometimes at night when she has to quickly go to the bathroom, but has not had any falls.  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    Obesity     Prediabetes             Social History     Tobacco Use    Smoking status: Never     Passive exposure: Past    Smokeless tobacco: Never   Substance Use Topics    Alcohol use: No       Past Surgical History:   Procedure Laterality Date    ABDOMINAL WALL MESH  REMOVAL  1997, 2005    Placement, 1997, removal and replacement 2005    BLADDER SURGERY      bladder tact X2    BREAST BIOPSY Right 04/2013    benign    BREAST EXCISIONAL BIOPSY Right 05/2013    benign    BREAST SURGERY Right     precancerous spot removed  CHOLECYSTECTOMY      HYSTERECTOMY      IR EMBOLIZATION VENOUS OTHER THAN HEMORRHAGE  02/17/2023    IR EMBOLIZATION VENOUS OTHER THAN HEMORRHAGE 02/17/2023 Ammie Dalton, MD IMG VIR H&V [facility]    PR COLSC FLX W/RMVL OF TUMOR POLYP LESION SNARE TQ N/A 10/14/2016    Procedure: COLONOSCOPY FLEX; W/REMOV TUMOR/LES BY SNARE;  Surgeon: Alfred Levins, MD;  Location: [facility] GI PROCEDURES [facility];  Service: Gastroenterology    PR COLSC FLX W/RMVL OF TUMOR POLYP LESION SNARE TQ N/A 01/15/2023    Procedure: COLONOSCOPY FLEX; W/REMOV TUMOR/LES BY SNARE;  Surgeon: Mickie Hillier, MD;  Location: [facility];  Service: Gastroenterology    PR PERC CLOS,CONG INTERATRIAL COMMUN W/IMPL N/A 09/12/2022    Procedure: ASD closure;  Surgeon: Jacquelyne Balint, MD;  Location: [facility] CATH;  Service: Cardiology    PR UPPER GI ENDOSCOPY,BIOPSY N/A 01/15/2023    Procedure: UGI ENDOSCOPY; WITH BIOPSY, SINGLE OR MULTIPLE;  Surgeon: Mickie Hillier, MD;  Location: GI [facility]EDURES [facility];  Service: Gastroenterology    SKIN BIOPSY               Family History   Problem Relation Age of Onset Heart disease Mother     Hypertension Mother     COPD Mother     Heart disease Father     Kidney disease Father     Allergies Father     Gout Father     Diabetes Father     Allergies Other         FAMILY H/O    Coronary artery disease Other         FAMILY H/O    Kidney failure Other         FAMILY H/O    No Known Problems Sister     No Known Problems Daughter     No Known Problems Maternal Grandmother     No Known Problems Maternal Grandfather     No Known Problems Paternal Grandmother     No Known Problems Paternal Grandfather     Alcohol abuse Maternal Uncle     Birth defects Son     Birth defects Son     BRCA 1/2 Neg Hx     Breast cancer Neg Hx     Cancer Neg Hx     Colon cancer Neg Hx     Endometrial cancer Neg Hx     Ovarian cancer Neg Hx     Substance Abuse Disorder Neg Hx     Mental illness Neg Hx         Allergies: Penicillins, Shellfish containing products, Glipizide, and Lisinopril                  Objective Findings  Precautions / Restrictions  Precautions: Non-applicable  Weight Bearing Status: Non-applicable  Required Braces or Orthoses: Non-applicable     Pain Comments: Denies pain     Equipment / Environment: Vascular access (PIV, TLC, Port-a-cath, PICC)     Vitals/Orthostatics : VSS     Living Situation  Living Environment: House  Lives With: Spouse  Home Living: One level home, Able to Live on main level with bedroom/bathroom, Level entry, Walk-in shower, Handicapped height toilet, Grab bars in shower, Grab bars around toilet Leisure centre manager)      Cognition: WFL, Follows 2-step commands  Cognition comment: A&Ox4, pt is alert, cooperative, and able to answer questions reliably and appropriately  Orientation: Oriented x4  Visual/Perception: Within  Functional Limits  Hearing: No deficit identified     Skin Inspection: Intact where visualized     Upper Extremities  UE ROM: Right WFL, Left WFL  UE Strength: Right WFL, Left WFL    Lower Extremities  LE ROM: Right WFL, Left WFL  LE Strength: Right WFL, Left WFL          Coordination: WFL  Proprioception: Not tested  Sensation: WFL  Posture: WFL    Static Sitting-Level of Assistance: Independent  Dynamic Sitting-Level of Assistance: Independent    Static Standing-Level of Assistance: Independent  Dynamic Standing - Level of Assistance: Independent      Bed Mobility: Supine to Sit  Supine to Sit assistance level: Independent     Transfers: Sit to Stand  Sit to Stand assistance level: Independent  Transfer comments: Without an assistive device      Gait Level of Assistance: Independent  Gait Distance Ambulated (ft): 250 ft  Skilled Treatment Performed: Slow but steady gait pattern, demos wide BOS and decreased step length. No overt LOB or concerns for balance deficits during ambulation.     Stairs: NA--pt does not have stairs at home      Wheelchair Mobility: NA     Endurance: Fair    Patient at end of session: All needs in reach, In bed, Lines intact, Nurse notified, Staff present (MD in room speaking to patient)    Physical Therapy Session Duration  PT Individual [mins]: 16          AM-PAC-6 click  Help currently need turning over In bed?: None - Modified Independent/Independent  Help currently needed sitting down/standing up from chair with arms? : None - Modified Independent/Independent  Help currently needed moving from supine to sitting on edge of bed?: None - Modified Independent/Independent  Help currently needed moving to and from bed from wheelchair?: None - Modified Independent/Independent  Help currently needed walking in a hospital room?: None - Modified Independent/Independent  Help currently needed climbing 3-5 steps with railing?: None - Modified Independent/Independent    Basic Mobility Score 6 click: 24    6 click Score (in points): % of Functional Impairment, Limitation, Restriction  6: 100% impaired, limited, restricted  7-8: At least 80%, but less than 100% impaired, limited restricted  9-13: At least 60%, but less than 80% impaired, limited restricted  14-19: At least 40%, but less than 60% impaired, limited restricted  20-22: At least 20%, but less than 40% impaired, limited restricted  23: At least 1%, but less than 20% impaired, limited restricted  24: 0% impaired, limited restricted        I attest that I have reviewed the above information.  Signed: Loel Ro, PT  Filed 08/14/2023

## 2023-08-14 NOTE — Unmapped (Signed)
Internal Medicine (MEDW) Progress Note    Assessment & Plan:   Denise York is a 58 y.o. female whose presentation is complicated by cirrhosis secondary to NAFLD with history of hepatic encephalopathy, viral hepatitis, CHF, DM II presented to [facility] as a transfer from [facility] for melena c/f UGI vs variceal bleed. Underwent EGD on 2/6 with no acute abnormalities and no evidence of varices.     Principal Problem:    Melena    Active Problems    Melena in MASLD Cirrhosis c/f UGI vs Variceal Bleed - Acute Blood Loss Anemia - Hepatic Encephalopathy   Originally presented to [facility] with hepatic encephalopathy iso melena for one week after stopping lactulose 2/2 multiple bowel movements. CTA A/P GI bleed protocol w/o active GI bleed. Started on rocephin, octreotide, lactulose and transferred to [facility]. Had multiple bowel movements without blood iso restarting lactulose. Patient underwent EGD with GI on 2/6 which demonstrated no acute abnormalities and no evidence of varices. Fe panel showed Fe to be low at 20 and ferritin of 11. Iron deficit of about 1500mg . Of note, patient is A&Ox4 but has word finding difficulty and slowed thought process.  - GI consulted, appreciate recs   - CLD  - bowel prep for colonoscopy tomorrow   - q8h CBC  - daily hepatic function panel, BMP, PT-INR  - continue octreotide gtt   - continue protonix 40mg  IV BID  - continue lactulose 20g TID, goal 3-5 BM   - continue rocephin for 5 days (2/5-2/9) or until discharge   - continue rifaximin 550mg  BID  - hold home lasix, spronolactone, carvedilol iso acute bleed   - maintain active type and screen  - ferrlecit 750mg  q12 x2 doses   - restrictive transfusion protocol for hgb <7     Thrombocytopenia   Baseline plt 30-50s  -Transfuse for plt <20. If obvious active bleed with change in hemodynamics/hematemesis/melena, follow usual transfusion guidelines of transfusion for plt <50     Chronic Problems  HLD: continue home atorvastatin 20 mg daily  T2DM: SSI ordered  Depression: continue home lexapro 10 mg daily        Issues Impacting Complexity of Management:  The patient's presentation is complicated by the following clinically significant conditions requiring additional evaluation and treatment: - Thrombocytopenia POA requiring further investigation or monitor  - Metabolic Encephalopathy POA requiring further investigation or monitoring       Daily Checklist:  Diet: Clear Liquid Diet  DVT PPx: Contraindicated - High Risk for Bleeding/Active Bleeding  Electrolytes: No Repletion Needed  Code Status: Full Code  Dispo: Home    Team Contact Information:   Primary Team: Internal Medicine (MEDW)  Primary Resident: Bailey Mech, MD, MD  Resident's Pager: [phone] (Gen MedW Intern - Alvester Morin)    Interval History:   No acute events overnight. Patient reports improvement of confusion and no further episodes of melena    ROS: Denies headache, chest pain, shortness of breath, abdominal pain, nausea, vomiting.    Objective:   Temp:  [35.7 ??C (96.3 ??F)-36.8 ??C (98.2 ??F)] 36.3 ??C (97.3 ??F)  Heart Rate:  [72-83] 75  SpO2 Pulse:  [72-81] 74  Resp:  [11-20] 11  BP: (100-130)/(42-92) 120/56  SpO2:  [98 %-100 %] 100 %    Gen: NAD, converses  Neuro: A&Ox4, slow thought process and intermittent word finding difficulty, asterixis    HENT: atraumatic, normocephalic  Heart: RRR, systolic murmur   Lungs: CTAB, no crackles or wheezes  Abdomen:  soft, NTND  Extremities: No edema     Monico Blitz, MD  Anesthesiology, PGY-1

## 2023-08-14 NOTE — Unmapped (Signed)
Pt alert and oriented x 4.Up ad lib in room. Falls precautions maintained.NPO since midnight for upper scope to GI procedures.Results of test showed no signs of bleeding.Pt will be NPO at midnight for colonoscopy tomorrow.Now on clear liquid diet.Tolerating well.No reports of melena this shift.Remains on octreotide infusion.Updated on plan of care by MD.No further needs voiced.VSS.Call bell within reach.plan of care continues.  Problem: Adult Inpatient Plan of Care  Goal: Plan of Care Review  Outcome: Progressing  Flowsheets (Taken 08/14/2023 1411)  Progress: improving  Plan of Care Reviewed With: patient  Goal: Patient-Specific Goal (Individualized)  Outcome: Progressing  Flowsheets (Taken 08/14/2023 1411)  Patient/Family-Specific Goals (Include Timeframe): pt will remain hemodynamically stable during this shift 7a-7p  Individualized Care Needs: monitor for bleeding,monitor labs,v/s,iv antibiotics  Anxieties, Fears or Concerns: none voiced  Goal: Absence of Hospital-Acquired Illness or Injury  Outcome: Progressing  Intervention: Identify and Manage Fall Risk  Recent Flowsheet Documentation  Taken 08/14/2023 1400 by Ileene Rubens, RN  Safety Interventions: environmental modification  Taken 08/14/2023 1230 by Ileene Rubens, RN  Safety Interventions: environmental modification  Taken 08/14/2023 1000 by Ileene Rubens, RN  Safety Interventions: environmental modification  Taken 08/14/2023 0730 by Ileene Rubens, RN  Safety Interventions: environmental modification  Intervention: Prevent and Manage VTE (Venous Thromboembolism) Risk  Recent Flowsheet Documentation  Taken 08/14/2023 1400 by Ileene Rubens, RN  Anti-Embolism Device Status: (bleeding risk) Other (Comment)  Taken 08/14/2023 1230 by Ileene Rubens, RN  Anti-Embolism Device Status: (bleeding risk) Other (Comment)  Taken 08/14/2023 1000 by Ileene Rubens, RN  Anti-Embolism Device Status: (bleeding risk) Other (Comment)  Taken 08/14/2023 0730 by Ileene Rubens, RN  Anti-Embolism Device Status: (bleeding risk) Other (Comment)  Goal: Optimal Comfort and Wellbeing  Outcome: Progressing  Goal: Readiness for Transition of Care  Outcome: Progressing  Goal: Rounds/Family Conference  Outcome: Progressing  Flowsheets (Taken 08/14/2023 1411)  Participants:   patient   nursing   physician

## 2023-08-14 NOTE — Unmapped (Addendum)
Outpatient follow up:  [ ]  follow up with GI regarding further endoscopy     Denise York is a 58 y.o. female whose presentation is complicated by cirrhosis secondary to NAFLD with history of hepatic encephalopathy, viral hepatitis, CHF, DM II presented to [facility] as a transfer from [facility] for melena c/f UGI vs variceal bleed. Underwent EGD on 2/6 with no acute abnormalities and no evidence of varices. Colonoscopy on 2/7 with no evidence of bleeding. 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 Patient to follow up with GI for appointment and future endoscopy.     Melena in [facility] Cirrhosis c/f UGI vs Variceal Bleed - Acute Blood Loss Anemia - Hepatic Encephalopathy   Originally presented to [facility] with hepatic encephalopathy iso melena for one week after stopping lactulose 2/2 multiple bowel movements. CTA A/P GI bleed protocol w/o active GI bleed. Started on rocephin, octreotide, lactulose and transferred to [facility]. Had multiple bowel movements without blood iso restarting lactulose. Patient underwent EGD with GI on 2/6 which demonstrated no acute abnormalities and no evidence of varices.  Colonoscopy on 2/7 with no signs of bleeding. Baseline HH around 11-12 but remained stable around 8. Fe panel showing IDA so was treated with IV Fe.     Thrombocytopenia   Baseline plt 30-50s. No indication for transfusion. Plt upon discharge 28k with no signs of active bleeding.      Chronic Problems  HLD: continue home atorvastatin 20 mg daily  T2DM: SSI ordered  Depression: continue home lexapro 10 mg daily

## 2023-08-14 NOTE — Unmapped (Signed)
OCCUPATIONAL THERAPY  Evaluation (08/14/23 0922)    Patient Name:  Denise York       Medical Record Number: [id number]     Date of Birth: 02-09-1966  Sex: Female      Post-Discharge Occupational Therapy Recommendations: Skilled OT services NOT indicated          Equipment Recommendation  OT DME Recommendations: None       OT Treatment Diagnosis:      [place] presents to skilled acute OT with no needs indicated    Assessment     Personal Factors/Comorbidities (Occupational Profile and History Review): Brief (Low)     Clinical Decision Making: Low Complexity    Assessment: HERMENIA York is a 58 y.o. female whose presentation is complicated by cirrhosis secondary to NAFLD with history of hepatic encephalopathy, viral hepatitis, CHF, DM II presented to [facility] as a transfer from [facility] for melena c/f UGI vs variceal bleed.    Denise York presents to skilled acute OT with no needs indicated, presenting near her functional baseline. She is currently mod I/independent for ADL routines. At this date, d/c from acute OT POC, re-consult if new needs arise. Following d/c from the hosptial, no post-acute OT needs indicated.       Today's Interventions: Educated re: role of OT, POC, importance of continued mobility/ADL participation while in the hosptial. Completed bed mobility, LBD, functional STS t/f, room level functional mobility, and standing ADL routine.    Activity Tolerance During Today's Session  Tolerated treatment well    Plan  Planned Frequency of Treatment:    D/C Services Weekly Frequency: D/C Services       Planned Interventions:         GOALS:   Patient and Family Goals: To get back home      Prognosis:  Good  Positive Indicators:  CLOF, social support  Barriers to Discharge: None    Subjective  Medical Updates Since Last Visit/Relevant PMH Affecting Clinical Decision Making: N/A  Prior Functional Status PTA, Denise York was independent with all ADL tasks abd getting some assistance for IADLs. She lives with her husband (who does the cooking) and step daughter (who helps out with other IADLs) and her granddog. In her free time, she enjoys spending time with her sisters. She reports not going out much 2/2 bowel incontinence from medication. She denies any falls within the last 6 months but a few close calls 2/2 urgency to use bathroom. She does not own/use any AD for functional mobility.    Medical Tests / Procedures: Reviewed       Patient / Caregiver reports: I was supposed to have a girls' day with my sisters this Saturday!      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    Obesity     Prediabetes     Social History     Tobacco Use    Smoking status: Never     Passive exposure: Past    Smokeless tobacco: Never   Substance Use Topics    Alcohol use: No      Past Surgical History:  Procedure Laterality Date    ABDOMINAL WALL MESH  REMOVAL  1997, 2005    Placement, 1997, removal and replacement 2005    BLADDER SURGERY      bladder tact X2    BREAST BIOPSY Right 04/2013    benign    BREAST EXCISIONAL BIOPSY Right 05/2013    benign    BREAST SURGERY Right     precancerous spot removed    CHOLECYSTECTOMY      HYSTERECTOMY      IR EMBOLIZATION VENOUS OTHER THAN HEMORRHAGE  02/17/2023    IR EMBOLIZATION VENOUS OTHER THAN HEMORRHAGE 02/17/2023 Ammie Dalton, MD IMG [facility]IR [facility]    PR COLSC FLX W/RMVL OF TUMOR POLYP LESION SNARE TQ N/A 10/14/2016    Procedure: COLONOSCOPY FLEX; W/REMOV TUMOR/LES BY SNARE;  Surgeon: Denise Levins, MD;  Location: [facility] GI PROCEDURES Denise York;  Service: Gastroenterology    PR COLSC FLX W/RMVL OF TUMOR POLYP LESION SNARE TQ N/A 01/15/2023    Procedure: COLONOSCOPY FLEX; W/REMOV TUMOR/LES BY SNARE;  Surgeon: Denise Hillier, MD; [facility]: [facility];  Service: Gastroenterology    PR PERC CLOS,CONG INTERATRIAL COMMUN W/IMPL N/A 09/12/2022    Procedure: ASD closure;  Surgeon: Denise Balint, MD;  Location: [facility] CATH;  Service: Cardiology    PR UPPER GI ENDOSCOPY,BIOPSY N/A 01/15/2023    Procedure: UGI ENDOSCOPY; WITH BIOPSY, SINGLE OR MULTIPLE;  Surgeon: Denise Hillier, MD;  Location: [facility];  Service: Gastroenterology    SKIN BIOPSY      Family History   Problem Relation Age of Onset    Heart disease Mother     Hypertension Mother     COPD Mother     Heart disease Father     Kidney disease Father     Allergies Father     Gout Father     Diabetes Father     Allergies Other         FAMILY H/O    Coronary artery disease Other         FAMILY H/O    Kidney failure Other         FAMILY H/O    No Known Problems Sister     No Known Problems Daughter     No Known Problems Maternal Grandmother     No Known Problems Maternal Grandfather     No Known Problems Paternal Grandmother     No Known Problems Paternal Grandfather     Alcohol abuse Maternal Uncle     Birth defects Son     Birth defects Son     BRCA 1/2 Neg Hx     Breast cancer Neg Hx     Cancer Neg Hx     Colon cancer Neg Hx     Endometrial cancer Neg Hx     Ovarian cancer Neg Hx     Substance Abuse Disorder Neg Hx     Mental illness Neg Hx         Penicillins, Shellfish containing products, Glipizide, and Lisinopril     Objective Findings  Precautions / Restrictions  Non-applicable       Weight Bearing  Non-applicable    Required Braces or Orthoses  Non-applicable    Communication Preference  Verbal       Pain  Denied pain    Equipment / Environment  Vascular access (PIV, TLC, Port-a-cath, PICC)    Living Situation  Living Environment: House  Lives With: Spouse, Other (Reports  that step daughter is also living with them (and her granddog)  Home Living: One level home, Able to Live on main level with bedroom/bathroom, Level entry, Walk-in shower, Handicapped height toilet, Grab bars in shower, Grab bars around toilet  Caregiver Identified?: Yes  Equipment available at home: None     Cognition   Orientation Level:  Oriented x 4   Arousal/Alertness:  Appropriate responses to stimuli   Attention Span:  Appears intact   Memory:  Appears intact   Following Commands:  Follows all commands and directions without difficulty   Safety Judgment:  Good awareness of safety precautions   Awareness of Errors and Problem Solving:  Patient self-corrected errors   Comments: Reported experiencing some confusion since being admitted into hospital but oriented x4    Vision / Hearing   Vision: No acute deficits identified     Hearing: No deficit identified         Hand Function:  Right Hand Function: Right hand grip strength, ROM and coordination WNL  Left Hand Function: Left hand grip strength, ROM and coordination WNL    Skin Inspection:       Face/Cervical ROM:  Face ROM: WFL  Cervical ROM: WFL    ROM / Strength:  UE ROM/Strength: Left WFL, Right WFL  LE ROM/Strength: Left WFL, Right WFL    Coordination:  Coordination: WFL    Sensation:  RUE Sensation: RUE intact  LUE Sensation: LUE intact  RLE Sensation: RLE intact  LLE Sensation: LLE intact    Balance:  Static Sitting-Level of Assistance: Independent  Dynamic Sitting-Level of Assistance: Archivist Standing-Level of Assistance: Independent  Dynamic Standing - Level of Assistance: Independent    Functional Mobility  Transfers: Independent, Modified Independent  Bed Mobility - Needs Assistance: Independent  Ambulation: Completed rool level functional mobility with SBA and no AD    ADLs  ADLs: Independent  IADLs: NT    Vitals / Orthostatics  Vitals/Orthostatics: VSS    Patient at end of session: All needs in reach, In bed, Lines intact, Nurse notified     Occupational Therapy Session Duration  OT Individual [mins]: 22       AM-PAC-Daily Activity  Lower Body Dressing assistance needs: None - Modified Independent/Independent  Bathing assistance needs: None - Modified Independent/Independent  Toileting assistance needs: None - Modified Independent/Independent  Upper Body Dressing assistance needs: None - Modified Independent/Independent  Personal Grooming assistance needs: None - Modified Independent/Independent  Eating Meals assistance needs: None - Modified Independent/Independent    Daily Activity Score: 24    Score (in points): % of Functional Impairment, Limitation, Restriction  6: 100% impaired, limited, restricted  7-8: At least 80%, but less than 100% impaired, limited restricted  9-13: At least 60%, but less than 80% impaired, limited restricted  14-19: At least 40%, but less than 60% impaired, limited restricted  20-22: At least 20%, but less than 40% impaired, limited restricted  23: At least 1%, but less than 20% impaired, limited restricted  24: 0% impaired, limited restricted      I attest that I have reviewed the above information.  Signed: Wilhelmenia Blase, OT  Filed 08/14/2023

## 2023-08-14 NOTE — Unmapped (Signed)
A&Ox4, VSS, PRN tylenol given for headache x1. Octreotide gtt continued as ordered. Up ad lib, free from falls and injury. Bed low and locked, call bell and side table within reach. Pt npo as ordered.    Problem: Adult Inpatient Plan of Care  Goal: Plan of Care Review  Outcome: Progressing  Goal: Patient-Specific Goal (Individualized)  Outcome: Progressing  Goal: Absence of Hospital-Acquired Illness or Injury  Outcome: Progressing  Intervention: Prevent Skin Injury  Recent Flowsheet Documentation  Taken 08/14/2023 0125 by Chauncy Passy, RN  Positioning for Skin: Supine/Back  Device Skin Pressure Protection: absorbent pad utilized/changed  Skin Protection: adhesive use limited  Goal: Optimal Comfort and Wellbeing  Outcome: Progressing  Goal: Readiness for Transition of Care  Outcome: Progressing  Goal: Rounds/Family Conference  Outcome: Progressing     Problem: Fall Injury Risk  Goal: Absence of Fall and Fall-Related Injury  Outcome: Progressing

## 2023-08-14 NOTE — Unmapped (Signed)
VENOUS ACCESS ULTRASOUND PROCEDURE NOTE    Indications:   Poor venous access.    The Venous Access Team has assessed this patient for the placement of a PIV. Ultrasound guidance was necessary to obtain access.     Procedure Details:  Identity of the patient was confirmed via name, medical record number and date of birth. The availability of the correct equipment was verified.    The vein was identified for ultrasound catheter insertion.  Field was prepared with necessary supplies and equipment.  Probe cover and sterile gel utilized.  Insertion site was prepped with chlorhexidine solution and allowed to dry.  The catheter extension was primed with normal saline. A(n) 20 gauge 1.75 catheter was placed in the L Forearm with 1attempt(s). See LDA for additional details.    Catheter aspirated, 3 mL blood return present. The catheter was then flushed with 10 mL of normal saline. Insertion site cleansed, and dressing applied per manufacturer guidelines. The catheter was inserted without difficulty by Kirk Ruths, RN.    Care RN was notified.     Thank you,     Kirk Ruths, RN Venous Access Team   [phone]     Workup / Procedure Time:  30 minutes    See image below:

## 2023-08-15 LAB — CBC
HEMATOCRIT: 24.7 % — ABNORMAL LOW (ref 34.0–44.0)
HEMATOCRIT: 25.1 % — ABNORMAL LOW (ref 34.0–44.0)
HEMATOCRIT: 24.4 % — ABNORMAL LOW (ref 34.0–44.0)
HEMOGLOBIN: 8.3 g/dL — ABNORMAL LOW (ref 11.3–14.9)
HEMOGLOBIN: 8.4 g/dL — ABNORMAL LOW (ref 11.3–14.9)
HEMOGLOBIN: 8.1 g/dL — ABNORMAL LOW (ref 11.3–14.9)
MEAN CORPUSCULAR HEMOGLOBIN CONC: 33.6 g/dL (ref 32.0–36.0)
MEAN CORPUSCULAR HEMOGLOBIN CONC: 33.4 g/dL (ref 32.0–36.0)
MEAN CORPUSCULAR HEMOGLOBIN CONC: 33.4 g/dL (ref 32.0–36.0)
MEAN CORPUSCULAR HEMOGLOBIN: 27.6 pg (ref 25.9–32.4)
MEAN CORPUSCULAR HEMOGLOBIN: 27.7 pg (ref 25.9–32.4)
MEAN CORPUSCULAR HEMOGLOBIN: 27.6 pg (ref 25.9–32.4)
MEAN CORPUSCULAR VOLUME: 82.3 fL (ref 77.6–95.7)
MEAN CORPUSCULAR VOLUME: 83 fL (ref 77.6–95.7)
MEAN CORPUSCULAR VOLUME: 82.8 fL (ref 77.6–95.7)
MEAN PLATELET VOLUME: 11.5 fL — ABNORMAL HIGH (ref 6.8–10.7)
MEAN PLATELET VOLUME: 11.3 fL — ABNORMAL HIGH (ref 6.8–10.7)
MEAN PLATELET VOLUME: 10.7 fL (ref 6.8–10.7)
PLATELET COUNT: 36 10*9/L — ABNORMAL LOW (ref 150–450)
PLATELET COUNT: 31 10*9/L — ABNORMAL LOW (ref 150–450)
PLATELET COUNT: 33 10*9/L — ABNORMAL LOW (ref 150–450)
RED BLOOD CELL COUNT: 3 10*12/L — ABNORMAL LOW (ref 3.95–5.13)
RED BLOOD CELL COUNT: 3.02 10*12/L — ABNORMAL LOW (ref 3.95–5.13)
RED BLOOD CELL COUNT: 2.94 10*12/L — ABNORMAL LOW (ref 3.95–5.13)
RED CELL DISTRIBUTION WIDTH: 15.3 % — ABNORMAL HIGH (ref 12.2–15.2)
RED CELL DISTRIBUTION WIDTH: 15.1 % (ref 12.2–15.2)
RED CELL DISTRIBUTION WIDTH: 15 % (ref 12.2–15.2)
WBC ADJUSTED: 2.6 10*9/L — ABNORMAL LOW (ref 3.6–11.2)
WBC ADJUSTED: 3.3 10*9/L — ABNORMAL LOW (ref 3.6–11.2)
WBC ADJUSTED: 3.1 10*9/L — ABNORMAL LOW (ref 3.6–11.2)

## 2023-08-15 LAB — BASIC METABOLIC PANEL
ANION GAP: 9 mmol/L (ref 5–14)
BLOOD UREA NITROGEN: 12 mg/dL (ref 9–23)
BUN / CREAT RATIO: 12
CALCIUM: 8.9 mg/dL (ref 8.7–10.4)
CHLORIDE: 104 mmol/L (ref 98–107)
CO2: 30 mmol/L (ref 20.0–31.0)
CREATININE: 1.02 mg/dL (ref 0.55–1.02)
EGFR CKD-EPI (2021) FEMALE: 64 mL/min/{1.73_m2} (ref >=60)
GLUCOSE RANDOM: 106 mg/dL (ref 70–179)
POTASSIUM: 4.1 mmol/L (ref 3.4–4.8)
SODIUM: 143 mmol/L (ref 135–145)

## 2023-08-15 LAB — PROTIME-INR
INR: 1.46
PROTIME: 16.7 s — ABNORMAL HIGH (ref 9.9–12.6)

## 2023-08-15 LAB — MAGNESIUM: MAGNESIUM: 1.8 mg/dL (ref 1.6–2.6)

## 2023-08-15 MED ADMIN — propofol (DIPRIVAN) infusion 10 mg/mL: INTRAVENOUS | @ 16:00:00 | Stop: 2023-08-15

## 2023-08-15 MED ADMIN — pantoprazole (Protonix) injection 40 mg: 40 mg | INTRAVENOUS | @ 03:00:00

## 2023-08-15 MED ADMIN — lactulose (CEPHULAC) packet 20 g: 20 g | ORAL | @ 03:00:00

## 2023-08-15 MED ADMIN — Propofol (DIPRIVAN) injection: INTRAVENOUS | @ 16:00:00 | Stop: 2023-08-15

## 2023-08-15 MED ADMIN — cefTRIAXone (ROCEPHIN) 1 g in sodium chloride 0.9 % (NS) 100 mL IVPB-MBP: 1 g | INTRAVENOUS | @ 19:00:00 | Stop: 2023-08-18

## 2023-08-15 MED ADMIN — rifAXIMin (XIFAXAN) tablet 550 mg: 550 mg | ORAL | @ 03:00:00 | Stop: 2023-08-23

## 2023-08-15 MED ADMIN — octreotide 500 mcg in sodium chloride 0.9 % 100 mL (5 mcg/mL) infusion: 50 ug/h | INTRAVENOUS | @ 08:00:00 | Stop: 2023-08-15

## 2023-08-15 MED ADMIN — lidocaine (PF) (XYLOCAINE-MPF) 20 mg/mL (2 %) injection: INTRAVENOUS | @ 16:00:00 | Stop: 2023-08-15

## 2023-08-15 MED ADMIN — atorvastatin (LIPITOR) tablet 20 mg: 20 mg | ORAL | @ 19:00:00

## 2023-08-15 MED ADMIN — escitalopram oxalate (LEXAPRO) tablet 10 mg: 10 mg | ORAL | @ 19:00:00

## 2023-08-15 MED ADMIN — sodium ferric gluconate (FERRLECIT) 250 mg in sodium chloride (NS) 0.9 % 100 mL IVPB: 250 mg | INTRAVENOUS | @ 20:00:00 | Stop: 2023-08-17

## 2023-08-15 MED ADMIN — acetaminophen (TYLENOL) tablet 650 mg: 650 mg | ORAL | @ 23:00:00

## 2023-08-15 MED ADMIN — rifAXIMin (XIFAXAN) tablet 550 mg: 550 mg | ORAL | @ 19:00:00 | Stop: 2023-08-23

## 2023-08-15 MED ADMIN — phenylephrine 1 mg/10 mL (100 mcg/mL) injection Syrg: INTRAVENOUS | @ 16:00:00 | Stop: 2023-08-15

## 2023-08-15 MED ADMIN — pantoprazole (Protonix) EC tablet 40 mg: 40 mg | ORAL | @ 23:00:00

## 2023-08-15 MED ADMIN — sodium ferric gluconate (FERRLECIT) 250 mg in sodium chloride (NS) 0.9 % 100 mL IVPB: 250 mg | INTRAVENOUS | @ 03:00:00 | Stop: 2023-08-17

## 2023-08-15 NOTE — Unmapped (Signed)
Hepatology Consult Service   Treatment Plan         Recommendations:   Denise York is a 58 y.o. female who presented to [facility] with melena. We have been following for melena.    Patient prepped for colonoscopy without issue. Remains HDS, Hgb 8.3 (overall slow downtrend but stable), BUN 12), no blood noted with bowel prep. Patient underwent colonoscopy 2/7: which showed congested mucosa in the colon, normal ileum, no bleeding seen on exam. Given no etiology of melena seen on EGD or Colonoscopy patient swallowed VCE.Will plan to follow up VCE 2/8. Overall consider likely that given no further episodes of bleed, that etiology may have resolved at this time.    After Capsule Endoscopy:  -- Video capsule dietary guidelines after capsule placement/swallowing capsule: 0-2hrs NPO, 2-4hrs clear liquids & meds, 4-6hrs light snacks, >6hrs regular diet  -- Please note, patient MUST WEAR BELT in order for receiver to transmit images from capsule; cannot be placed on edge of bed/next to patient or else images may not be transmitted resulting in incomplete study.   -- GI fellow will pick up belt in the morning/when study complete, do not need to page on call fellow unless acute concerns/malfunctions arise  -- Please make sure patient does not get a MRI before the capsule has passed (if indicated, please get abdominal x-rays and contact GI fellow)  -- Continue to monitor Hgb and transfuse for <7/    Thank you for involving [place] in the care of your patient. We will continue to follow along with you.

## 2023-08-15 NOTE — Unmapped (Signed)
Patient alert and oriented x 4 this shift.  Patient taken to GI for colonoscopy this am.  No bleeding found , so capsule study started.  Patient compliant with belt  diet changes.   Patient complained of headache relived with tylenol.  No other needs voiced.    Problem: Adult Inpatient Plan of Care  Goal: Plan of Care Review  Outcome: Ongoing - Unchanged  Flowsheets (Taken 08/15/2023 1836)  Progress: improving  Plan of Care Reviewed With: patient  Goal: Patient-Specific Goal (Individualized)  Outcome: Ongoing - Unchanged  Goal: Absence of Hospital-Acquired Illness or Injury  Outcome: Ongoing - Unchanged  Intervention: Identify and Manage Fall Risk  Recent Flowsheet Documentation  Taken 08/15/2023 0800 by Darrold Junker, RN  Safety Interventions: low bed  Intervention: Prevent Skin Injury  Recent Flowsheet Documentation  Taken 08/15/2023 0800 by Holloway-Shambo, Gerhard Munch, RN  Positioning for Skin: Supine/Back  Device Skin Pressure Protection: absorbent pad utilized/changed  Skin Protection: adhesive use limited  Goal: Optimal Comfort and Wellbeing  Outcome: Ongoing - Unchanged  Goal: Readiness for Transition of Care  Outcome: Ongoing - Unchanged  Goal: Rounds/Family Conference  Outcome: Ongoing - Unchanged     Problem: Fall Injury Risk  Goal: Absence of Fall and Fall-Related Injury  Outcome: Ongoing - Unchanged  Intervention: Promote Injury-Free Environment  Recent Flowsheet Documentation  Taken 08/15/2023 0800 by Holloway-Shambo, Gerhard Munch, RN  Safety Interventions: low bed

## 2023-08-15 NOTE — Unmapped (Addendum)
Pt with adm dx of Melana . Pt A & O x 4 and able to express needs . VSS , afebrile , room air . Generalized weakness and malaise noted . Pt concerned about bowel prep and Golytely but denied pain 0/10 , no N/V , no SOB/dyspnea . Pt encouraged to drink Golytely and to complete bowel prep .Intermittent period of stool incontinence , assistance with ADLs provided . Pt successfully complete bowel prep with clear stool @ 0200 . Pt noted resting quietly in bed with eyes closed remainder of shift . No acute distress noted . Will continue with current POC . Pt scheduled for possible Colonoscopy today 08/15/23 .    Problem: Adult Inpatient Plan of Care  Goal: Plan of Care Review  Outcome: Progressing  Goal: Patient-Specific Goal (Individualized)  Outcome: Progressing  Flowsheets (Taken 08/15/2023 0626)  Patient/Family-Specific Goals (Include Timeframe): Pt will tolerate bowel prep and remain free from falls and injuries during this shift 7p -7a .  Individualized Care Needs: V/S , labs , pain mgmt , ACHS , bowel prep/ golytely , stool incont , colonoscopy , falls/safety precautions .  Anxieties, Fears or Concerns: Pt concerned about bowel prep/golytely  Goal: Absence of Hospital-Acquired Illness or Injury  Outcome: Progressing  Intervention: Identify and Manage Fall Risk  Recent Flowsheet Documentation  Taken 08/15/2023 0600 by Harlow Ohms, RN  Safety Interventions:   fall reduction program maintained   environmental modification   low bed   nonskid shoes/slippers when out of bed  Taken 08/15/2023 0400 by Harlow Ohms, RN  Safety Interventions:   fall reduction program maintained   environmental modification   low bed   nonskid shoes/slippers when out of bed  Taken 08/15/2023 0200 by Harlow Ohms, RN  Safety Interventions:   fall reduction program maintained   environmental modification   low bed   nonskid shoes/slippers when out of bed  Taken 08/15/2023 0000 by Harlow Ohms, RN  Safety Interventions:   fall reduction program maintained   environmental modification   low bed   nonskid shoes/slippers when out of bed  Taken 08/14/2023 2200 by Harlow Ohms, RN  Safety Interventions:   fall reduction program maintained   environmental modification   low bed   nonskid shoes/slippers when out of bed  Taken 08/14/2023 2000 by Harlow Ohms, RN  Safety Interventions:   fall reduction program maintained   environmental modification   low bed   nonskid shoes/slippers when out of bed  Taken 08/14/2023 1920 by Harlow Ohms, RN  Safety Interventions:   fall reduction program maintained   environmental modification   low bed   nonskid shoes/slippers when out of bed  Intervention: Prevent Skin Injury  Recent Flowsheet Documentation  Taken 08/14/2023 2000 by Harlow Ohms, RN  Positioning for Skin: Supine/Back  Device Skin Pressure Protection: absorbent pad utilized/changed  Skin Protection: incontinence pads utilized  Intervention: Prevent and Manage VTE (Venous Thromboembolism) Risk  Recent Flowsheet Documentation  Taken 08/15/2023 0600 by Harlow Ohms, RN  Anti-Embolism Device Status: (VTE : Bleeding risk) Other (Comment)  Taken 08/15/2023 0400 by Harlow Ohms, RN  Anti-Embolism Device Status: (VTE : Bleeding risk) Other (Comment)  Taken 08/15/2023 0200 by Harlow Ohms, RN  Anti-Embolism Device Status: (VTE : Bleeding risk) Other (Comment)  Taken 08/15/2023 0000 by Harlow Ohms, RN  Anti-Embolism Device Status: (VTE : Bleeding risk) Other (Comment)  Taken 08/14/2023 2200 by Harlow Ohms, RN  Anti-Embolism  Device Status: (VTE : Bleeding risk) Other (Comment)  Taken 08/14/2023 2000 by Harlow Ohms, RN  Anti-Embolism Device Status: (VTE : Bleeding risk) Other (Comment)  Goal: Optimal Comfort and Wellbeing  Outcome: Progressing  Goal: Readiness for Transition of Care  Outcome: Progressing  Goal: Rounds/Family Conference  Outcome: Progressing

## 2023-08-15 NOTE — Unmapped (Signed)
Internal Medicine (MEDW) Progress Note    Assessment & Plan:   Denise York is a 58 y.o. female whose presentation is complicated by cirrhosis secondary to NAFLD with history of hepatic encephalopathy, viral hepatitis, CHF, DM II presented to [facility] as a transfer from [facility] for melena c/f UGI vs variceal bleed. Underwent EGD on 2/6 with no acute abnormalities and no evidence of varices.     Principal Problem:    Melena    Active Problems    Melena in MASLD Cirrhosis c/f UGI vs Variceal Bleed - Acute Blood Loss Anemia - Hepatic Encephalopathy   Originally presented to [facility] with hepatic encephalopathy iso melena for one week after stopping lactulose 2/2 multiple bowel movements. CTA A/P GI bleed protocol w/o active GI bleed. Started on rocephin, octreotide, lactulose and transferred to [facility]. Had multiple bowel movements without blood iso restarting lactulose. Patient underwent EGD with GI on 2/6 which demonstrated no acute abnormalities and no evidence of varices. Fe panel showed Fe to be low at 20 and ferritin of 11. Iron deficit of about 1500mg . Of note, patient is A&Ox4 but has word finding difficulty and slowed thought process. Colonoscopy on 2/7 with no signs of bleeding.   - GI consulted, appreciate recs   - colonoscopy with no bleeding  - currently undergoing video capsule endoscopy   - q8h CBC  - daily hepatic function panel, BMP, PT-INR  - discontinue octreotide gtt   - continue protonix 40mg  oral BID  - continue lactulose 20g BID, goal 3-5 BM   - continue rocephin for 5 days (2/5-2/9) or until discharge   - continue rifaximin 550mg  BID  - hold home lasix, spronolactone, carvedilol iso acute bleed   - maintain active type and screen  - ferrlecit 750mg  q12 x2 doses   - restrictive transfusion protocol for hgb <7     Thrombocytopenia   Baseline plt 30-50s  -Transfuse for plt <20. If obvious active bleed with change in hemodynamics/hematemesis/melena, follow usual transfusion guidelines of transfusion for plt <50 Chronic Problems  HLD: continue home atorvastatin 20 mg daily  T2DM: SSI ordered  Depression: continue home lexapro 10 mg daily        Issues Impacting Complexity of Management:  The patient's presentation is complicated by the following clinically significant conditions requiring additional evaluation and treatment: - Thrombocytopenia POA requiring further investigation or monitor  - Metabolic Encephalopathy POA requiring further investigation or monitoring       Daily Checklist:  Diet: Clear Liquid Diet  DVT PPx: Contraindicated - High Risk for Bleeding/Active Bleeding  Electrolytes: No Repletion Needed  Code Status: Full Code  Dispo: Home    Team Contact Information:   Primary Team: Internal Medicine (MEDW)  Primary Resident: Bailey Mech, MD, MD  Resident's Pager: [phone] (Gen MedW Intern - Alvester Morin)    Interval History:   No acute events overnight. Reports feeling well after colonoscopy.     ROS: Denies headache, chest pain, shortness of breath, abdominal pain, nausea, vomiting.    Objective:   Temp:  [36.2 ??C (97.2 ??F)-37.5 ??C (99.5 ??F)] 36.6 ??C (97.9 ??F)  Heart Rate:  [66-79] 79  Resp:  [9-18] 18  BP: (93-144)/(43-73) 124/49  SpO2:  [93 %-100 %] 95 %    Gen: NAD, converses  Neuro: A&Ox4, slow thought process and intermittent word finding difficulty    HENT: atraumatic, normocephalic  Heart: RRR, systolic murmur   Lungs: CTAB, no crackles or wheezes  Abdomen: soft, NTND  Extremities:  No edema     Monico Blitz, MD  Anesthesiology, PGY-1

## 2023-08-16 LAB — BASIC METABOLIC PANEL
ANION GAP: 8 mmol/L (ref 5–14)
BLOOD UREA NITROGEN: 10 mg/dL (ref 9–23)
BUN / CREAT RATIO: 11
CALCIUM: 8.9 mg/dL (ref 8.7–10.4)
CHLORIDE: 104 mmol/L (ref 98–107)
CO2: 30 mmol/L (ref 20.0–31.0)
CREATININE: 0.93 mg/dL (ref 0.55–1.02)
EGFR CKD-EPI (2021) FEMALE: 72 mL/min/{1.73_m2} (ref >=60)
GLUCOSE RANDOM: 93 mg/dL (ref 70–179)
POTASSIUM: 4 mmol/L (ref 3.4–4.8)
SODIUM: 142 mmol/L (ref 135–145)

## 2023-08-16 LAB — CBC
HEMATOCRIT: 25.6 % — ABNORMAL LOW (ref 34.0–44.0)
HEMOGLOBIN: 8.4 g/dL — ABNORMAL LOW (ref 11.3–14.9)
MEAN CORPUSCULAR HEMOGLOBIN CONC: 32.8 g/dL (ref 32.0–36.0)
MEAN CORPUSCULAR HEMOGLOBIN: 27.4 pg (ref 25.9–32.4)
MEAN CORPUSCULAR VOLUME: 83.5 fL (ref 77.6–95.7)
MEAN PLATELET VOLUME: 11.3 fL — ABNORMAL HIGH (ref 6.8–10.7)
PLATELET COUNT: 30 10*9/L — ABNORMAL LOW (ref 150–450)
RED BLOOD CELL COUNT: 3.07 10*12/L — ABNORMAL LOW (ref 3.95–5.13)
RED CELL DISTRIBUTION WIDTH: 15.1 % (ref 12.2–15.2)
WBC ADJUSTED: 2.9 10*9/L — ABNORMAL LOW (ref 3.6–11.2)

## 2023-08-16 LAB — HEPATIC FUNCTION PANEL
ALBUMIN: 2.4 g/dL — ABNORMAL LOW (ref 3.4–5.0)
ALKALINE PHOSPHATASE: 73 U/L (ref 46–116)
ALT (SGPT): 34 U/L (ref 10–49)
AST (SGOT): 58 U/L — ABNORMAL HIGH (ref <=34)
BILIRUBIN DIRECT: 0.4 mg/dL — ABNORMAL HIGH (ref 0.00–0.30)
BILIRUBIN TOTAL: 1 mg/dL (ref 0.3–1.2)
PROTEIN TOTAL: 5.5 g/dL — ABNORMAL LOW (ref 5.7–8.2)

## 2023-08-16 LAB — MAGNESIUM: MAGNESIUM: 1.9 mg/dL (ref 1.6–2.6)

## 2023-08-16 LAB — PROTIME-INR
INR: 1.58
PROTIME: 18 s — ABNORMAL HIGH (ref 9.9–12.6)

## 2023-08-16 MED ADMIN — rifAXIMin (XIFAXAN) tablet 550 mg: 550 mg | ORAL | @ 15:00:00 | Stop: 2023-08-23

## 2023-08-16 MED ADMIN — rifAXIMin (XIFAXAN) tablet 550 mg: 550 mg | ORAL | @ 03:00:00 | Stop: 2023-08-23

## 2023-08-16 MED ADMIN — lactulose (CEPHULAC) packet 20 g: 20 g | ORAL | @ 23:00:00

## 2023-08-16 MED ADMIN — atorvastatin (LIPITOR) tablet 20 mg: 20 mg | ORAL | @ 15:00:00

## 2023-08-16 MED ADMIN — sodium ferric gluconate (FERRLECIT) 250 mg in sodium chloride (NS) 0.9 % 100 mL IVPB: 250 mg | INTRAVENOUS | @ 03:00:00 | Stop: 2023-08-17

## 2023-08-16 MED ADMIN — furosemide (LASIX) tablet 80 mg: 80 mg | ORAL | @ 17:00:00

## 2023-08-16 MED ADMIN — lactulose (CEPHULAC) packet 20 g: 20 g | ORAL | @ 03:00:00

## 2023-08-16 MED ADMIN — spironolactone (ALDACTONE) tablet 50 mg: 50 mg | ORAL | @ 17:00:00

## 2023-08-16 MED ADMIN — escitalopram oxalate (LEXAPRO) tablet 10 mg: 10 mg | ORAL | @ 15:00:00

## 2023-08-16 MED ADMIN — cefTRIAXone (ROCEPHIN) 1 g in sodium chloride 0.9 % (NS) 100 mL IVPB-MBP: 1 g | INTRAVENOUS | @ 15:00:00 | Stop: 2023-08-18

## 2023-08-16 MED ADMIN — pantoprazole (Protonix) EC tablet 40 mg: 40 mg | ORAL | @ 23:00:00

## 2023-08-16 MED ADMIN — sodium ferric gluconate (FERRLECIT) 250 mg in sodium chloride (NS) 0.9 % 100 mL IVPB: 250 mg | INTRAVENOUS | @ 15:00:00 | Stop: 2023-08-16

## 2023-08-16 MED ADMIN — lactulose (CEPHULAC) packet 20 g: 20 g | ORAL | @ 15:00:00 | Stop: 2023-08-16

## 2023-08-16 MED ADMIN — pantoprazole (Protonix) EC tablet 40 mg: 40 mg | ORAL | @ 15:00:00

## 2023-08-16 NOTE — Unmapped (Signed)
A/O x 4, afebrile, VSS on RA, denies pain at this time. IV clean, dry, intact, and IV infusion given without problem. Cont on Tele monitor. BS been checked. On PO lactulose and BMx5. Falls precautions maintained. Call bell and bedside table within reach, bed in lowest position and locked. No falls or injuries noted this shift. Will continue with plan of care.    Problem: Adult Inpatient Plan of Care  Goal: Plan of Care Review  Outcome: Progressing  Flowsheets (Taken 08/16/2023 1813)  Progress: improving  Plan of Care Reviewed With: patient  Goal: Patient-Specific Goal (Individualized)  Outcome: Progressing  Flowsheets (Taken 08/16/2023 1813)  Patient/Family-Specific Goals (Include Timeframe): Pt will remain free from falls and injuries during this shift  Individualized Care Needs: V/S , labs , pain mgmt ,ACHS , Iron infusion , Capsule study , stool incont , falls/safety precautions  Anxieties, Fears or Concerns: none voiced  Goal: Absence of Hospital-Acquired Illness or Injury  Outcome: Progressing  Intervention: Identify and Manage Fall Risk  Recent Flowsheet Documentation  Taken 08/16/2023 0800 by Lonia Mad, RN  Safety Interventions:   environmental modification   fall reduction program maintained   family at bedside   infection management   lighting adjusted for tasks/safety   muscle strengthening facilitated   nonskid shoes/slippers when out of bed   low bed  Intervention: Prevent and Manage VTE (Venous Thromboembolism) Risk  Recent Flowsheet Documentation  Taken 08/16/2023 1800 by Lonia Mad, RN  Anti-Embolism Device Status: (GI bleed) Other (Comment)  Taken 08/16/2023 1600 by Lonia Mad, RN  Anti-Embolism Device Status: (GI bleed) Other (Comment)  Taken 08/16/2023 1400 by Lonia Mad, RN  Anti-Embolism Device Status: (GI bleed) Other (Comment)  Taken 08/16/2023 1215 by Lonia Mad, RN  Anti-Embolism Device Status: (GI bleed) Other (Comment)  Taken 08/16/2023 1000 by Lonia Mad, RN  Anti-Embolism Device Status: (GI bleed) Other (Comment)  Taken 08/16/2023 0800 by Lonia Mad, RN  Anti-Embolism Device Status: (GI bleed) Other (Comment)  Intervention: Prevent Infection  Recent Flowsheet Documentation  Taken 08/16/2023 0800 by Lonia Mad, RN  Infection Prevention:   environmental surveillance performed   equipment surfaces disinfected   hand hygiene promoted   rest/sleep promoted   single patient room provided  Goal: Optimal Comfort and Wellbeing  Outcome: Progressing  Goal: Readiness for Transition of Care  Outcome: Progressing  Goal: Rounds/Family Conference  Outcome: Progressing     Problem: Fall Injury Risk  Goal: Absence of Fall and Fall-Related Injury  Outcome: Progressing  Intervention: Promote Injury-Free Environment  Recent Flowsheet Documentation  Taken 08/16/2023 0800 by Lonia Mad, RN  Safety Interventions:   environmental modification   fall reduction program maintained   family at bedside   infection management   lighting adjusted for tasks/safety   muscle strengthening facilitated   nonskid shoes/slippers when out of bed   low bed

## 2023-08-16 NOTE — Unmapped (Signed)
Hepatology Consult Service   Progress Note         Assessment and Recommendations:   Denise York is a 58 y.o. female with a PMHx of  MASH cirrhosis, hepatic encephalopathy, esophageal and gastric varices, and T2DM who presented to [facility] with melena. The patient is seen in consultation at the request of Deeann Cree, MD ([facility] (MDW)) for melena.    Melena  Initial presentation with 4 days of dark stool Hgb 8.1, iron deficiency down from baseline of 11-12. EGD 2/6 with nml esophagus, mild PHG, 3 discrete nodular mucosa in gastric antrum at sites of prior ulceration, normal duodenum. Colonoscopy 2/7 congested mucosa, normal TI. 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 If final read confirms could reach with SBE, would consider as outpatient if otherwise remains HDS and Hgb stable.     -Continue rocephin for 5 days (2/5-2/9) or until discharge  -Restart home diuretics  -Maintain active type and screen   -Restrictive transfusion protocol for hgb <7     Grade I, C, Hepatic Encephalopathy   Discussion: Symptoms of word finding difficulties and brain fog continue today, has asterixis. 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 Recently stopped lactulose for worsening diarrhea over the last couple weeks after stopping her Ozempic. Please ensure getting daily MELD labs.      Recommendations:   -Continue lactulose 20g TID for goal 3 BM/day, reduce for diarrhea  -Continue home rifaximin 550mg  BID  -Daily CBC, LFT, INR      Issues Impacting Complexity of Management:  -The patient has the need for intensive monitoring parameter(s) due to high-risk of clinical decline: frequent monitoring of MELD-Na score to monitor for progression to/progression of acute or acute on chronic liver failure    Recommendations discussed with the patient's primary team. We will continue to follow along with you.    History:   Having bowel movements, no blood in stool, brown stool  Having some confusion and brain fog still    Objective:   Temp:  [36.6 ??C (97.9 ??F)-37.7 ??C (99.9 ??F)] 36.7 ??C (98.1 ??F)  Heart Rate:  [66-79] 71  Resp:  [9-18] 17  BP: (93-143)/(43-70) 112/55  SpO2:  [95 %-100 %] 100 %    Gen: Chronically ill-appearing, NAD,  some answers slowed but responding appropriately  Extremities: Edema in the BLEs  Neuro: . +Asterixis.  Psych: Alert, normal mood and affect.     Pertinent Labs/Studies:  -I have reviewed the patient's labs from 08/16/23 which show stable Hgb, improving renal function (SCr), and stable INR, requested LFTs (not available at time of note 2/8)

## 2023-08-16 NOTE — Unmapped (Addendum)
Colonoscopy completed 08/15/23 , Capsule study in process . Pt drowsy but easy to arouse . Pt remained A & O x 4 and able to express needs . VSS , afebrile , room air . Pt denied pain but c/o cramps and swelling bilateral upper extremities ( arms ) . Med team paged and notified . Intermittent period of stool incontinence while asleep , assistance with ADLs and perineal care provided . Falls and safety precautions reinforced with bed low and locked , SR up x 3 , call bell within reach , bed alarm on . Pt noted resting quietly in bed with eyes closed remainder of shift . No acute distress noted . Will continue with current POC .     Problem: Adult Inpatient Plan of Care  Goal: Plan of Care Review  Outcome: Progressing  Goal: Patient-Specific Goal (Individualized)  Outcome: Progressing  Flowsheets (Taken 08/16/2023 0346)  Patient/Family-Specific Goals (Include Timeframe): Pt will remain free from falls and injuries during this shift 7p -7a .  Individualized Care Needs: V/S , labs , pain mgmt ,ACHS , Iron infusion ,  Capsule study , stool incont , falls/safety precautions  Anxieties, Fears or Concerns: Pt concerned about stool incontience  Goal: Absence of Hospital-Acquired Illness or Injury  Outcome: Progressing  Intervention: Identify and Manage Fall Risk  Recent Flowsheet Documentation  Taken 08/16/2023 0200 by Harlow Ohms, RN  Safety Interventions:   fall reduction program maintained   environmental modification   low bed   nonskid shoes/slippers when out of bed   room near unit station  Taken 08/16/2023 0000 by Harlow Ohms, RN  Safety Interventions:   fall reduction program maintained   environmental modification   low bed   nonskid shoes/slippers when out of bed   room near unit station  Taken 08/15/2023 2200 by Harlow Ohms, RN  Safety Interventions:   fall reduction program maintained   environmental modification   low bed   nonskid shoes/slippers when out of bed   room near unit station  Taken 08/15/2023 2000 by Harlow Ohms, RN  Safety Interventions:   fall reduction program maintained   environmental modification   low bed   nonskid shoes/slippers when out of bed  Intervention: Prevent Skin Injury  Recent Flowsheet Documentation  Taken 08/15/2023 2100 by Harlow Ohms, RN  Device Skin Pressure Protection: absorbent pad utilized/changed  Skin Protection: incontinence pads utilized  Taken 08/15/2023 2000 by Harlow Ohms, RN  Positioning for Skin: Supine/Back  Device Skin Pressure Protection: absorbent pad utilized/changed  Skin Protection: incontinence pads utilized  Intervention: Prevent and Manage VTE (Venous Thromboembolism) Risk  Recent Flowsheet Documentation  Taken 08/16/2023 0200 by Harlow Ohms, RN  Anti-Embolism Device Status: (VTE : GI Bleed) Other (Comment)  Taken 08/16/2023 0000 by Harlow Ohms, RN  Anti-Embolism Device Status: (VTE : GI Bleed) Other (Comment)  Taken 08/15/2023 2200 by Harlow Ohms, RN  Anti-Embolism Device Status: (VTE : GI Bleed) Other (Comment)  Taken 08/15/2023 2100 by Harlow Ohms, RN  Anti-Embolism Device Status: (VTE : GI Bleed) Other (Comment)  Taken 08/15/2023 2000 by Harlow Ohms, RN  Anti-Embolism Device Status: (VTE : GI Bleed) Other (Comment)  Goal: Optimal Comfort and Wellbeing  Outcome: Progressing  Goal: Readiness for Transition of Care  Outcome: Progressing  Goal: Rounds/Family Conference  Outcome: Progressing

## 2023-08-16 NOTE — Unmapped (Signed)
Internal Medicine (MEDW) Progress Note    Assessment & Plan:   Denise York is a 58 y.o. female whose presentation is complicated by cirrhosis secondary to NAFLD with history of hepatic encephalopathy, viral hepatitis, CHF, DM II presented to [facility] as a transfer from [facility] for melena c/f UGI vs variceal bleed. Underwent EGD on 2/6 with no acute abnormalities and no evidence of varices.     Principal Problem:    Melena    Active Problems    Melena- MASLD Cirrhosis- HE  EGD 2/6 with nml esophagus, mild PHG, 3 discrete nodular mucosa in gastric antrum at sites of prior ulceration, normal duodenum. Colonoscopy 2/7 congested mucosa, normal TI. 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 If final read confirms could reach with SBE, would consider as outpatient if otherwise remains HDS and Hgb stable.   4 days of melena, hg around 8. Baseline HH around 11-12.   - GI following   - daily hepatic function panel, BMP, PT-INR, CBC  - continue protonix 40mg  oral BID  - continue lactulose 20g TID, goal 3-5 BM   - continue rocephin for 5 days (2/5-2/9) or until discharge   - continue rifaximin 550mg  BID  - Restart home lasix and spironolactone   - maintain active type and screen  - ferrlecit 750mg  q12 x2 doses   - restrictive transfusion protocol for hgb <7     Thrombocytopenia-Acute blood loss Anemia   Baseline plt 30-50s. HH around 8.4.   -Transfuse for plt <20. If obvious active bleed with change in hemodynamics/hematemesis/melena, follow usual transfusion guidelines of transfusion for plt <50     Chronic Problems  HLD: continue home atorvastatin 20 mg daily  T2DM: SSI ordered  Depression: continue home lexapro 10 mg daily    Issues Impacting Complexity of Management:  The patient's presentation is complicated by the following clinically significant conditions requiring additional evaluation and treatment: - Thrombocytopenia POA requiring further investigation or monitor  - Metabolic Encephalopathy POA requiring further investigation or monitoring       Daily Checklist:  Diet: Clear Liquid Diet  DVT PPx: Contraindicated - High Risk for Bleeding/Active Bleeding  Electrolytes: No Repletion Needed  Code Status: Full Code  Dispo: Home    Team Contact Information:   Primary Team: Internal Medicine (MEDW)  Primary Resident: Yehuda Savannah, MD, MD  Resident's Pager: [phone] (Gen MedW Intern - Alvester Morin)    Interval History:   Feeling tired this morning. Still little confused. Discussed about restarting diuretics. Denies abdominal pain. No melena.     Objective:   Temp:  [36.5 ??C (97.7 ??F)-36.9 ??C (98.4 ??F)] 36.5 ??C (97.7 ??F)  Heart Rate:  [66-78] 78  Resp:  [16-20] 20  BP: (108-132)/(44-69) 120/69  SpO2:  [96 %-100 %] 99 %    Gen: NAD, converses  Neuro: A&Ox4, slow thought process and intermittent word finding difficulty    HENT: atraumatic, normocephalic  Heart: RRR, systolic murmur   Lungs: CTAB, no crackles or wheezes  Abdomen: soft, NTND  Extremities: No edema

## 2023-08-17 LAB — PROTIME-INR
INR: 1.61
PROTIME: 18.4 s — ABNORMAL HIGH (ref 9.9–12.6)

## 2023-08-17 LAB — BASIC METABOLIC PANEL
ANION GAP: 8 mmol/L (ref 5–14)
BLOOD UREA NITROGEN: 9 mg/dL (ref 9–23)
BUN / CREAT RATIO: 10
CALCIUM: 8.7 mg/dL (ref 8.7–10.4)
CHLORIDE: 102 mmol/L (ref 98–107)
CO2: 31 mmol/L (ref 20.0–31.0)
CREATININE: 0.89 mg/dL (ref 0.55–1.02)
EGFR CKD-EPI (2021) FEMALE: 76 mL/min/{1.73_m2} (ref >=60)
GLUCOSE RANDOM: 101 mg/dL (ref 70–179)
POTASSIUM: 3.6 mmol/L (ref 3.4–4.8)
SODIUM: 141 mmol/L (ref 135–145)

## 2023-08-17 LAB — HEPATIC FUNCTION PANEL
ALBUMIN: 2.3 g/dL — ABNORMAL LOW (ref 3.4–5.0)
ALKALINE PHOSPHATASE: 71 U/L (ref 46–116)
ALT (SGPT): 31 U/L (ref 10–49)
AST (SGOT): 50 U/L — ABNORMAL HIGH (ref <=34)
BILIRUBIN DIRECT: 0.4 mg/dL — ABNORMAL HIGH (ref 0.00–0.30)
BILIRUBIN TOTAL: 0.8 mg/dL (ref 0.3–1.2)
PROTEIN TOTAL: 5.4 g/dL — ABNORMAL LOW (ref 5.7–8.2)

## 2023-08-17 LAB — CBC
HEMATOCRIT: 23.8 % — ABNORMAL LOW (ref 34.0–44.0)
HEMOGLOBIN: 8 g/dL — ABNORMAL LOW (ref 11.3–14.9)
MEAN CORPUSCULAR HEMOGLOBIN CONC: 33.6 g/dL (ref 32.0–36.0)
MEAN CORPUSCULAR HEMOGLOBIN: 27.8 pg (ref 25.9–32.4)
MEAN CORPUSCULAR VOLUME: 82.7 fL (ref 77.6–95.7)
MEAN PLATELET VOLUME: 10.9 fL — ABNORMAL HIGH (ref 6.8–10.7)
PLATELET COUNT: 28 10*9/L — ABNORMAL LOW (ref 150–450)
RED BLOOD CELL COUNT: 2.88 10*12/L — ABNORMAL LOW (ref 3.95–5.13)
RED CELL DISTRIBUTION WIDTH: 14.9 % (ref 12.2–15.2)
WBC ADJUSTED: 2.9 10*9/L — ABNORMAL LOW (ref 3.6–11.2)

## 2023-08-17 LAB — MAGNESIUM: MAGNESIUM: 1.6 mg/dL (ref 1.6–2.6)

## 2023-08-17 MED ADMIN — insulin lispro (HumaLOG) injection 0-4 Units: 0-4 [IU] | SUBCUTANEOUS | @ 17:00:00 | Stop: 2023-08-17

## 2023-08-17 MED ADMIN — pantoprazole (Protonix) EC tablet 40 mg: 40 mg | ORAL | @ 14:00:00 | Stop: 2023-08-17

## 2023-08-17 MED ADMIN — rifAXIMin (XIFAXAN) tablet 550 mg: 550 mg | ORAL | @ 03:00:00 | Stop: 2023-08-23

## 2023-08-17 MED ADMIN — escitalopram oxalate (LEXAPRO) tablet 10 mg: 10 mg | ORAL | @ 14:00:00 | Stop: 2023-08-17

## 2023-08-17 MED ADMIN — furosemide (LASIX) tablet 80 mg: 80 mg | ORAL | @ 14:00:00 | Stop: 2023-08-17

## 2023-08-17 MED ADMIN — atorvastatin (LIPITOR) tablet 20 mg: 20 mg | ORAL | @ 14:00:00 | Stop: 2023-08-17

## 2023-08-17 MED ADMIN — magnesium sulfate 2gm/50mL IVPB: 2 g | INTRAVENOUS | @ 17:00:00 | Stop: 2023-08-17

## 2023-08-17 MED ADMIN — lactulose (CEPHULAC) packet 20 g: 20 g | ORAL | @ 14:00:00 | Stop: 2023-08-17

## 2023-08-17 MED ADMIN — cefTRIAXone (ROCEPHIN) 1 g in sodium chloride 0.9 % (NS) 100 mL IVPB-MBP: 1 g | INTRAVENOUS | @ 14:00:00 | Stop: 2023-08-17

## 2023-08-17 MED ADMIN — rifAXIMin (XIFAXAN) tablet 550 mg: 550 mg | ORAL | @ 14:00:00 | Stop: 2023-08-17

## 2023-08-17 MED ADMIN — sodium ferric gluconate (FERRLECIT) 250 mg in sodium chloride (NS) 0.9 % 100 mL IVPB: 250 mg | INTRAVENOUS | @ 03:00:00 | Stop: 2023-08-16

## 2023-08-17 MED ADMIN — melatonin tablet 3 mg: 3 mg | ORAL | @ 06:00:00 | Stop: 2023-08-17

## 2023-08-17 MED ADMIN — potassium chloride (KLOR-CON) packet 40 mEq: 40 meq | ORAL | @ 17:00:00 | Stop: 2023-08-17

## 2023-08-17 MED ADMIN — spironolactone (ALDACTONE) tablet 50 mg: 50 mg | ORAL | @ 14:00:00 | Stop: 2023-08-17

## 2023-08-17 NOTE — Unmapped (Signed)
Hepatology Consult Service   Progress Note         Assessment and Recommendations:   Denise York is a 58 y.o. female with a PMHx of  MASH cirrhosis, hepatic encephalopathy, esophageal and gastric varices, and T2DM who presented to [facility] with melena. The patient is seen in consultation at the request of Deeann Cree, MD ([facility] (MDW)) for melena.    Melena  Initial presentation with 4 days of dark stool Hgb 8.1, iron deficiency down from baseline of 11-12. EGD 2/6 with nml esophagus, mild PHG, 3 discrete nodular mucosa in gastric antrum at sites of prior ulceration, normal duodenum. Colonoscopy 2/7 congested mucosa, normal TI. 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 If final read confirms could reach with SBE, would consider as outpatient if otherwise remains HDS and Hgb stable.   -Will discuss SBE as outpatient   -Can stop Rocephin  -Continue home diuretics     Grade I, C, Hepatic Encephalopathy  Much improved today  -Continue lactulose 20g TID for goal 3 BM/day, reduce for diarrhea  -Continue home rifaximin 550mg  BID  -Daily CBC, LFT, INR      Issues Impacting Complexity of Management:  -The patient has the need for intensive monitoring parameter(s) due to high-risk of clinical decline: frequent monitoring of MELD-Na score to monitor for progression to/progression of acute or acute on chronic liver failure    Recommendations discussed with the patient's primary team. We will continue to follow along with you.    History:   Having bowel movements, no blood in stool, brown stool. Had good sleep last night and is much more clear this morning from encephalopathy standpoint. Asking to go home.    Objective:   Temp:  [36.5 ??C (97.7 ??F)-36.9 ??C (98.4 ??F)] 36.8 ??C (98.2 ??F)  Heart Rate:  [72-78] 74  Resp:  [16-20] 18  BP: (109-120)/(51-69) 109/51  SpO2:  [97 %-100 %] 100 %    Gen: Chronically ill-appearing, NAD,  some answers slowed but responding appropriately  Extremities: Edema in the BLEs  Neuro: Normal  Psych: Alert, normal mood and affect.     Pertinent Labs/Studies:  -I have reviewed the patient's labs from 08/17/23 which show stable Hgb, stable renal function (SCr, electrolytes), and stable INR, requested LFTs (not available at time of note 2/8)

## 2023-08-17 NOTE — Unmapped (Signed)
Physician Discharge Summary [facility]  [address] [facility]  [address]  [place] [address]  Dept: [phone]  Loc: [phone]     Identifying Information:   Denise York  1965/08/14  [id number]    Primary Care Physician: Marca Ancona, MD     Code Status: Full Code    Admit Date: 08/13/2023    Discharge Date: 08/17/2023     Discharge To: Home    Discharge Service: [facility] - General Medicine Floor Team (MED Alena Bills)     Discharge Attending Physician: Deeann Cree, MD    Discharge Diagnoses:   Principal Problem (Resolved):    Melena (POA: Unknown)  Active Problems:    * No active hospital problems. [facility] Course:   Outpatient follow up:  [ ]  follow up with GI regarding further endoscopy     Denise York is a 58 y.o. female whose presentation is complicated by cirrhosis secondary to NAFLD with history of hepatic encephalopathy, viral hepatitis, CHF, DM II presented to [facility] as a transfer from [facility] for melena c/f UGI vs variceal bleed. Underwent EGD on 2/6 with no acute abnormalities and no evidence of varices. Colonoscopy on 2/7 with no evidence of bleeding. 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 Patient to follow up with GI for appointment and future endoscopy.     Melena in MASLD Cirrhosis c/f UGI vs Variceal Bleed - Acute Blood Loss Anemia - Hepatic Encephalopathy   Originally presented to [facility] with hepatic encephalopathy iso melena for one week after stopping lactulose 2/2 multiple bowel movements. CTA A/P GI bleed protocol w/o active GI bleed. Started on rocephin, octreotide, lactulose and transferred to [facility]. Had multiple bowel movements without blood iso restarting lactulose. Patient underwent EGD with GI on 2/6 which demonstrated no acute abnormalities and no evidence of varices.  Colonoscopy on 2/7 with no signs of bleeding. Baseline HH around 11-12 but remained stable around 8. Fe panel showing IDA so was treated with IV Fe.     Thrombocytopenia   Baseline plt 30-50s. No indication for transfusion. Plt upon discharge 28k with no signs of active bleeding.      Chronic Problems  HLD: continue home atorvastatin 20 mg daily  T2DM: SSI ordered  Depression: continue home lexapro 10 mg daily    The patient's hospital stay has been complicated by the following clinically significant conditions requiring additional evaluation and treatment or having a significant effect of this patient's care: - Anemia POA requiring further investigation or monitoring             Outpatient Provider Follow Up Issues:   [ ]  follow up with GI regarding further endoscopy     Touchbase with Outpatient Provider:  Warm Handoff: Completed on 08/17/23 by Bailey Mech, MD  (Intern) via [facility] Ed Message    Procedures:  EGD and colonoscopy  ______________________________________________________________________  Discharge Medications:      Your Medication List        PAUSE taking these medications      carvedilol 6.25 MG tablet  Wait to take this until your doctor or other care provider tells you to start again.  Commonly known as: COREG  Take 1 tablet (6.25 mg total) by mouth two (2) times a day. Please add to pill pack  CONTINUE taking these medications      AEROCHAMBER MV inhaler  Generic drug: inhalational spacing device  Use as directed with inhalers     atorvastatin 20 MG tablet  Commonly known as: LIPITOR  Take 1 tablet (20 mg total) by mouth daily.     blood-glucose meter kit  Use to test blood sugar DAILY     cholecalciferol (vitamin D3 25 mcg (1,000 units)) 1,000 unit (25 mcg) tablet  Take 2 tablets (50 mcg total) by mouth daily.     DAILY-VITE (WITH FOLIC ACID) 400 mcg Tab tablet  Generic drug: multivitamin with folic acid  Take 1 tablet by mouth daily.     escitalopram oxalate 10 MG tablet  Commonly known as: LEXAPRO  Take 1 tablet (10 mg total) by mouth daily.     FIBER ORAL  Take 4 capsules by mouth daily. BJs brand.     furosemide 80 MG tablet  Commonly known as: LASIX  Take 1 tablet (80 mg total) by mouth daily. Please add to pill packs     glucose blood test strip  Generic drug: blood sugar diagnostic  Use to test blood sugar daily     lactulose 20 gram packet  Commonly known as: CEPHULAC  Take 1 packet (20 g total) by mouth Three (3) times a day.     lancets Misc  Use to test blood sugar daily     loratadine 10 mg tablet  Commonly known as: CLARITIN  Take 1 tablet (10 mg total) by mouth daily.     magnesium oxide 400 mg (241.3 mg elemental) tablet  Commonly known as: MAG-OX  Take 1 tablet (400 mg total) by mouth daily.     metFORMIN 500 MG 24 hr tablet  Commonly known as: GLUCOPHAGE-XR  Take 1 tablet (500 mg total) by mouth daily with evening meal.     multivitamin per tablet  Commonly known as: TAB-A-VITE/THERAGRAN  Take 1 tablet by mouth daily.     naltrexone 50 mg tablet  Commonly known as: DEPADE  Take 1 tablet (50 mg total) by mouth daily. Please add to pill packs     omeprazole 20 MG capsule  Commonly known as: PriLOSEC  Take 1 capsule (20 mg total) by mouth Two (2) times a day (30 minutes before a meal).     spironolactone 50 MG tablet  Commonly known as: ALDACTONE  Take 1 tablet (50 mg total) by mouth daily.     XIFAXAN 550 mg Tab  Generic drug: rifAXIMin  Take 1 tablet (550 mg total) by mouth two (2) times a day.              Allergies:  Penicillins, Shellfish containing products, Glipizide, and Lisinopril  ______________________________________________________________________  Pending Test Results:  Pending Labs       Order Current Status    Blood Culture #1 Preliminary result    Blood Culture #2 Preliminary result            Most Recent Labs:  All lab results last 24 hours -   Recent Results (from the past 24 hours)   POCT Glucose    Collection Time: 08/16/23  5:39 PM   Result Value Ref Range    Glucose, POC 180 (H) 70 - 179 mg/dL   POCT Glucose    Collection Time: 08/16/23  9:39 PM   Result Value Ref Range    Glucose, POC 118 70 - 179 mg/dL   Magnesium Level  Collection Time: 08/17/23  7:41 AM   Result Value Ref Range    Magnesium 1.6 1.6 - 2.6 mg/dL   PT-INR    Collection Time: 08/17/23  7:41 AM   Result Value Ref Range    PT 18.4 (H) 9.9 - 12.6 sec    INR 1.61    Basic Metabolic Panel    Collection Time: 08/17/23  7:41 AM   Result Value Ref Range    Sodium 141 135 - 145 mmol/L    Potassium 3.6 3.4 - 4.8 mmol/L    Chloride 102 98 - 107 mmol/L    CO2 31.0 20.0 - 31.0 mmol/L    Anion Gap 8 5 - 14 mmol/L    BUN 9 9 - 23 mg/dL    Creatinine 7.82 9.56 - 1.02 mg/dL    BUN/Creatinine Ratio 10     eGFR CKD-EPI (2021) Female 76 >=60 mL/min/1.11m2    Glucose 101 70 - 179 mg/dL    Calcium 8.7 8.7 - 21.3 mg/dL   CBC    Collection Time: 08/17/23  7:41 AM   Result Value Ref Range    WBC 2.9 (L) 3.6 - 11.2 10*9/L    RBC 2.88 (L) 3.95 - 5.13 10*12/L    HGB 8.0 (L) 11.3 - 14.9 g/dL    HCT 08.6 (L) 57.8 - 44.0 %    MCV 82.7 77.6 - 95.7 fL    MCH 27.8 25.9 - 32.4 pg    MCHC 33.6 32.0 - 36.0 g/dL    RDW 46.9 62.9 - 52.8 %    MPV 10.9 (H) 6.8 - 10.7 fL    Platelet 28 (L) 150 - 450 10*9/L   Hepatic Function Panel    Collection Time: 08/17/23  7:41 AM   Result Value Ref Range    Albumin 2.3 (L) 3.4 - 5.0 g/dL    Total Protein 5.4 (L) 5.7 - 8.2 g/dL    Total Bilirubin 0.8 0.3 - 1.2 mg/dL    Bilirubin, Direct 4.13 (H) 0.00 - 0.30 mg/dL    AST 50 (H) <=24 U/L    ALT 31 10 - 49 U/L    Alkaline Phosphatase 71 46 - 116 U/L   POCT Glucose    Collection Time: 08/17/23  7:58 AM   Result Value Ref Range    Glucose, POC 95 70 - 179 mg/dL   POCT Glucose    Collection Time: 08/17/23 11:11 AM   Result Value Ref Range    Glucose, POC 218 (H) 70 - 179 mg/dL       Relevant Studies/Radiology:  Colonoscopy  Result Date: 08/15/2023  _______________________________________________________________________________ Patient Name: Denise York             Procedure Date: 08/15/2023 10:25 AM MRN: [id number] Date of Birth: 15-Mar-1966 Admit Type: Inpatient                 Age: 48 Room: GI [facility] OR 05 [facility]          Gender: Female Note Status: Finalized                Instrument Name: [id number] _______________________________________________________________________________  Procedure:             Colonoscopy Indications:           Melena Providers:             Beverly Milch, MD, Emerson Monte (Fellow), Jean Rosenthal  W. Derrill Kay, RN, Jacinta Shoe Referring MD:          Pia Mau, MD (Referring MD) Medicines:             Propofol per Anesthesia Complications:         No immediate complications. _______________________________________________________________________________ Procedure:             Pre-Anesthesia Assessment:                        - Prior to the procedure, a History and Physical was                        performed, and patient medications and allergies were                        reviewed. The patient's tolerance of previous                        anesthesia was also reviewed. The risks and benefits                        of the procedure and the sedation options and risks                        were discussed with the patient. All questions were                        answered, and informed consent was obtained. Prior                        Anticoagulants: The patient has taken no anticoagulant                        or antiplatelet agents. ASA Grade Assessment: III - A                        patient with severe systemic disease. After reviewing                        the risks and benefits, the patient was deemed in                        satisfactory condition to undergo the procedure.                        After obtaining informed consent, the scope was passed                        under direct vision. Throughout the procedure, the                        patient's blood pressure, pulse, and oxygen                        saturations were monitored continuously. The                        Colonoscope was introduced through the anus and  advanced to the the terminal ileum. The terminal                        ileum, ileocecal valve, appendiceal orifice, and                        rectum were photographed. The quality of the bowel                        preparation was adequate to identify polyps greater                        than 5 mm in size.                                                                                Findings:      The perianal and digital rectal examinations were normal.      An area of mildly congested mucosa was found in the entire colon.      The terminal ileum appeared normal.                                                                                Estimated Blood Loss:      Estimated blood loss: none. Impression:            - Congested mucosa in the entire examined colon.                        - The examined portion of the ileum was normal.                        - No bleeding seen on exam.                        - No specimens collected. Recommendation:        - Return patient to hospital ward for ongoing care.                        - Resume previous diet.                        - Continue present medications.                        - Further recommendations will be made by the Liver                        consult team.                        - To visualize the  small bowel, perform video capsule                        endoscopy today.                        - Repeat colonoscopy based on clinical course.                                                                                Procedure Code(s):     --- Professional ---                        [phone], Colonoscopy, flexible; diagnostic, including                        collection of specimen(s) by brushing or washing, when                        performed (separate procedure) Diagnosis Code(s):     --- Professional ---                        K63.89, Other specified diseases of intestine                        K92.1, Melena (includes Hematochezia)                        K57.30, Diverticulosis of large intestine without                        perforation or abscess without bleeding CPT copyright 2023 American Medical Association. All rights reserved. The codes documented in this report are preliminary and upon coder review may be revised to meet current compliance requirements. Electronically Signed By Beverly Milch, MD ______________ Beverly Milch, MD 08/15/2023 11:29:02 AM The attending physician was present throughout the entire procedure including the insertion, viewing, and removal of the endoscope. This procedure note has been electronically signed by: Beverly Milch , MD ____________________ Emerson Monte, Number of Addenda: 0 Note Initiated On: 08/15/2023 10:25 AM    Upper Endoscopy  Result Date: 08/14/2023  _______________________________________________________________________________ Patient Name: Denise York             Procedure Date: 08/14/2023 11:08 AM MRN: [id number]                     Date of Birth: 08-May-1966 Admit Type: Inpatient                 Age: 82 Room: GI [facility] OR 02 [facility]          Gender: Female Note Status: Finalized                Instrument Name: [id number] _______________________________________________________________________________  Procedure:             Upper GI endoscopy Indications:           Melena Providers:  Beverly Milch, MD, Garnette Czech (Fellow), Santa Lighter, CYNTHIA SMarland Kitchen SMALL Referring MD:          Pia Mau, MD (Referring MD) Medicines:             Propofol per Anesthesia Complications:         No immediate complications. _______________________________________________________________________________ Procedure:             Pre-Anesthesia Assessment:                        - Prior to the procedure, a History and Physical was                        performed, and patient medications and allergies were                        reviewed. The patient's tolerance of previous                        anesthesia was also reviewed. The risks and benefits                        of the procedure and the sedation options and risks                        were discussed with the patient. All questions were                        answered, and informed consent was obtained. Prior                        Anticoagulants: The patient has taken no anticoagulant                        or antiplatelet agents. ASA Grade Assessment: III - A                        patient with severe systemic disease. After reviewing                        the risks and benefits, the patient was deemed in                        satisfactory condition to undergo the procedure.                        After obtaining informed consent, the endoscope was                        passed under direct vision. Throughout the procedure,                        the patient's blood pressure, pulse, and oxygen                        saturations were monitored continuously. The Endoscope  was introduced through the mouth, and advanced to the                        second part of duodenum. The upper GI endoscopy was                        accomplished without difficulty. The patient tolerated                        the procedure well.                                                                                Findings:      The esophagus was normal.      Mild portal hypertensive gastropathy was found in the entire examined      stomach.      3 discrete areas of nodular mucosa was found in the gastric antrum at      sites of prior ulceration.      The examined duodenum was normal.                                                                                Estimated Blood Loss:      Estimated blood loss: none. Impression:            - Normal esophagus.                        - Portal hypertensive gastropathy. - Nodular mucosa in the gastric antrum, likely healing                        from prior sites of ulceration.                        - Normal examined duodenum.                        - No specimens collected. Recommendation:        - Further recommendations will be made by the Liver                        consult team.                                                                                Procedure Code(s):     --- Professional ---  69629, Esophagogastroduodenoscopy, flexible,                        transoral; diagnostic, including collection of                        specimen(s) by brushing or washing, when performed                        (separate procedure) Diagnosis Code(s):     --- Professional ---                        K31.89, Other diseases of stomach and duodenum                        K92.1, Melena (includes Hematochezia) CPT copyright 2023 American Medical Association. All rights reserved. The codes documented in this report are preliminary and upon coder review may be revised to meet current compliance requirements. Electronically Signed By Beverly Milch, MD ______________ Beverly Milch, MD 08/14/2023 12:04:16 PM The attending physician was present throughout the entire procedure including the insertion, viewing, and removal of the endoscope. This procedure note has been electronically signed by: Beverly Milch , MD ______________________ Garnette Czech, Number of Addenda: 0 Note Initiated On: 08/14/2023 11:08 AM    XR Chest Portable  Result Date: 08/14/2023  EXAM: XR CHEST PORTABLE ACCESSION: [id number] UN REPORT DATE: 08/14/2023 10:15 AM CLINICAL INDICATION: infectiouw workup ; OTHER  TECHNIQUE: Single View AP Chest Radiograph. COMPARISON: 09/13/2022 FINDINGS: Embolization coils in the upper abdomen. Lungs are clear.  No pleural effusion or pneumothorax. Cardiac silhouette is within normal limits for size given AP technique.     Negative chest.     CTA Abdomen Pelvis Gi Bleed  Result Date: 08/13/2023  EXAM: CTA Abdomen and Pelvis WO and With contrast (GI Bleed protocol) DATE: 08/13/2023 5:14 PM ACCESSION: [id number] UN DICTATED: 08/13/2023 5:17 PM INTERPRETATION LOCATION: MAIN CAMPUS CLINICAL INDICATION: hx cirrhosis, black tarry stools  COMPARISON: CT abdomen pelvis 05/31/2023 TECHNIQUE: A spiral CT scan was obtained without IV contrast from the lung basesto the pubic symphysis. Images were reconstructed in the axial plane. Next, a spiral CTA scan was obtained with IV contrast from the lung bases to the pubic symphysis in both arterial and delayed venous phases. Images were reconstructed in the axial plane. Multiplanar reformatted and MIP images were provided for further evaluation of the vessels. For selected cases, 3D volume rendered images are also provided. VASCULAR FINDINGS: ABDOMINAL AORTA: Patent. Normal caliber. Mild calcified atherosclerotic disease. CELIAC ARTERY: Patent.  SMA: Patent.  RENAL ARTERIES: Patent.  IMA: Patent.  RIGHT COMMON ILIAC ARTERY: Patent. RIGHT EXTERNAL ILIAC ARTERY: Patent. RIGHT COMMON FEMORAL/PROFUNDA FEMORAL/SFA: Patent.  LEFT COMMON ILIAC ARTERY: Patent. LEFT EXTERNAL ILIAC ARTERY: Patent. LEFT COMMON FEMORAL/PROFUNDA FEMORAL/SFA: Patent. PORTAL/MESENTERIC VEINS: Patent. Sequela of coil embolization of the recanalized paraumbilical vein. Small caliber esophageal varices. Moderate caliber perisplenic varices. SYSTEMIC VEINS: Patent. NON-VASCULAR FINDINGS: LOWER THORAX: Prior ASD repair. Mild coronary and aortic valvular calcifications. The visualized lung bases appear clear. No pleural or pericardial effusion. HEPATOBILIARY: Cirrhotic morphology of the liver with left hepatic lobe hypertrophy. No suspicious hepatic lesions. Gallbladder is surgically absent. No biliary dilatation.  SPLEEN: Mild splenomegaly measuring up to 13.9 cm in the craniocaudal dimension. PANCREAS: Mild nonspecific peripancreatic stranding. No pancreatic ductal dilatation. ADRENALS: Unremarkable. KIDNEYS/URETERS: Symmetric renal enhancement. Unchanged right interpolar renal cortical defect. Unchanged  punctate nonobstructing left renal calculus versus vascular calcification (4:195). No hydronephrosis. No solid renal mass. BLADDER: Decompressed, limiting evaluation. PELVIC/REPRODUCTIVE ORGANS: Uterus is surgically absent. GI TRACT: Note is made of paraesophageal varices. The stomach appears unremarkable. The loops of small bowel are normal in caliber. No evidence of acute colonic pathology. The appendix is not visualized. PERITONEUM/RETROPERITONEUM AND MESENTERY: No free air. Small volume ascites. LYMPH NODES: Multiple prominent mesenteric lymph nodes, likely reactive. No pathologically enlarged lymph nodes. BONES AND SOFT TISSUES: Multilevel degenerative changes of the visualized spine.     -- No evidence of active gastrointestinal hemorrhage at the time of the examination. --Mild peripancreatic stranding which is nonspecific and could be related to portal hypertension or acute pancreatitis. Clinical correlation is recommended. --Hepatic cirrhosis with sequela of portal hypertension including mild splenomegaly, upper abdominal and esophageal varices, and small volume ascites. -- Additional chronic and incidental findings as described in report findings.    ______________________________________________________________________  Discharge Instructions:     You will need to follow up in clinic with the GI doctors. They will schedule an appointment and call you with the details. If you do not receive a call within the next few days, please call [phone].      Activity Instructions       Activity as tolerated              Diet Instructions       Discharge diet (specify)      Discharge Nutrition Therapy: Regular                 Follow Up instructions and Outpatient Referrals     Call MD for:      Call MD for:  difficulty breathing, headache or visual disturbances      Call MD for: persistent nausea or vomiting      Call MD for:  severe uncontrolled pain      Call MD for: Temperature > 38.5 Celsius ( > 101.3 Fahrenheit)          Appointments which have been scheduled for you      Aug 18, 2023 12:00 PM  (Arrive by 11:45 AM)  RETURN ALLERGY TESTING with ALLERGY TESTING NURSE  [place] [facility] [place] [facility] REGION) [address]  [facility] 1 through 4  [place] [address]  [phone]        Sep 03, 2023 10:00 AM  (Arrive by 9:45 AM)  NEW GENERAL PCP with Corinda Gubler, PA  [facility]IC[facility] [place] [facility] REGION) [address]  [facility] 1 through 4  [place] [address]  [phone]        Sep 29, 2023 3:00 PM  (Arrive by 2:30 PM)  RETURN  HEPATOLOGY with Kennedy Bucker, MD  [facility] LIVER TRANSPLANT [facility] REGION) [address]  [place] [address]  [phone]        Dec 17, 2023 3:00 PM  RETURN CARDIOLOGY with Massie Maroon, MD  [facility] CARDIOLOGY AT [facility] REGION) [address] [place] [address]  [phone]        Feb 24, 2024 9:30 AM  (Arrive by 9:05 AM)  NEW NEUROLOGY with Garnette Scheuermann, MD  [facility] NEUROLOGY CLINIC [address]  [facility] REGION) [address]  [place] [address]  [phone]  ______________________________________________________________________  Discharge Day Services:  BP 109/51  - Pulse 74  - Temp 36.8 ??C (98.2 ??F) (Oral)  - Resp 18  - Ht 165.1 cm (5' 5)  - Wt 98.4 kg (217 lb)  - SpO2 100%  - BMI 36.11 kg/m??     Pt seen on the day of discharge and determined appropriate for discharge.    Condition at Discharge: good    Length of Discharge: I spent greater than 30 mins in the discharge of this patient.

## 2023-08-17 NOTE — Unmapped (Signed)
Shift quiet and unremarkable . Pt remained A & O x 4 and able to express needs . VSS , afebrile , room air . Denied pain , no N/V , no SOB/dyspnea . No active signs of bleeding noted . Pt c/o insomnia , PRN dose of Melatonin adm as ordered with relief . Pt declined scheduled HS dose of Lactulose , stated BM x 9 during the AM shift . Falls and safety precautions reinforced with bed low and locked , SR up x 3 , call bell within reach , bed alarm on . Pt noted resting quietly in bed with eyes closed . No acute distress noted . Will continue with current POC .     Problem: Adult Inpatient Plan of Care  Goal: Plan of Care Review  Outcome: Progressing  Goal: Patient-Specific Goal (Individualized)  Outcome: Progressing  Flowsheets (Taken 08/17/2023 0355)  Patient/Family-Specific Goals (Include Timeframe): Pt will remain hemodynamically stable and free from falls and injuries during this shift 7p -7a .  Individualized Care Needs: V/S , labs , ACHS, pain mgmt , insomnia , Iron infusion , lactulose regimen , falls/safety precautions .  Anxieties, Fears or Concerns: Pt concerned about Insomnia  Goal: Absence of Hospital-Acquired Illness or Injury  Outcome: Progressing  Intervention: Identify and Manage Fall Risk  Recent Flowsheet Documentation  Taken 08/17/2023 0200 by Harlow Ohms, RN  Safety Interventions:   fall reduction program maintained   environmental modification   low bed   nonskid shoes/slippers when out of bed  Taken 08/17/2023 0000 by Harlow Ohms, RN  Safety Interventions:   fall reduction program maintained   environmental modification   low bed   nonskid shoes/slippers when out of bed  Taken 08/16/2023 2200 by Harlow Ohms, RN  Safety Interventions:   fall reduction program maintained   environmental modification   low bed   nonskid shoes/slippers when out of bed  Taken 08/16/2023 2000 by Harlow Ohms, RN  Safety Interventions:   fall reduction program maintained   environmental modification   low bed   nonskid shoes/slippers when out of bed  Intervention: Prevent Skin Injury  Recent Flowsheet Documentation  Taken 08/16/2023 2000 by Harlow Ohms, RN  Positioning for Skin: Supine/Back  Device Skin Pressure Protection: absorbent pad utilized/changed  Skin Protection: incontinence pads utilized  Intervention: Prevent and Manage VTE (Venous Thromboembolism) Risk  Recent Flowsheet Documentation  Taken 08/17/2023 0200 by Harlow Ohms, RN  Anti-Embolism Device Status: (VTE : GI Bleed) Other (Comment)  Taken 08/17/2023 0000 by Harlow Ohms, RN  Anti-Embolism Device Status: (VTE : GI Bleed) Other (Comment)  Taken 08/16/2023 2200 by Harlow Ohms, RN  Anti-Embolism Device Status: (VTE : GI Bleed) Other (Comment)  Taken 08/16/2023 2000 by Harlow Ohms, RN  Anti-Embolism Device Status: (VTE : GI Bleed) Other (Comment)  Intervention: Prevent Infection  Recent Flowsheet Documentation  Taken 08/17/2023 0200 by Harlow Ohms, RN  Infection Prevention: rest/sleep promoted  Taken 08/17/2023 0000 by Harlow Ohms, RN  Infection Prevention: rest/sleep promoted  Taken 08/16/2023 2200 by Harlow Ohms, RN  Infection Prevention: environmental surveillance performed  Taken 08/16/2023 2000 by Harlow Ohms, RN  Infection Prevention: environmental surveillance performed  Goal: Optimal Comfort and Wellbeing  Outcome: Progressing  Goal: Readiness for Transition of Care  Outcome: Progressing  Goal: Rounds/Family Conference  Outcome: Progressing

## 2023-08-17 NOTE — Unmapped (Addendum)
Patient alert and oriented x4 this shift. VSS on RA. Up with stand by assist to [facility]. On tele with no calls. Medications given as ordered. ACHS. No other complaints during this time. Bed in lowest locked position with call bell within reach.     Problem: Adult Inpatient Plan of Care  Goal: Plan of Care Review  Outcome: Ongoing - Unchanged  Flowsheets (Taken 08/17/2023 1103)  Plan of Care Reviewed With: patient  Goal: Patient-Specific Goal (Individualized)  Outcome: Ongoing - Unchanged  Goal: Absence of Hospital-Acquired Illness or Injury  Outcome: Ongoing - Unchanged  Intervention: Identify and Manage Fall Risk  Recent Flowsheet Documentation  Taken 08/17/2023 0745 by Ritta Slot, RN  Safety Interventions:   environmental modification   fall reduction program maintained   lighting adjusted for tasks/safety   low bed  Intervention: Prevent Skin Injury  Recent Flowsheet Documentation  Taken 08/17/2023 0745 by Ritta Slot, RN  Positioning for Skin: Supine/Back  Intervention: Prevent and Manage VTE (Venous Thromboembolism) Risk  Recent Flowsheet Documentation  Taken 08/17/2023 1000 by Ritta Slot, RN  Anti-Embolism Device Status: (GI bleed) Other (Comment)  Taken 08/17/2023 0800 by Ritta Slot, RN  Anti-Embolism Device Status: (GI bleed) Other (Comment)  Taken 08/17/2023 0745 by Ritta Slot, RN  Anti-Embolism Device Status: (GI bleed) Other (Comment)  Intervention: Prevent Infection  Recent Flowsheet Documentation  Taken 08/17/2023 0745 by Ritta Slot, RN  Infection Prevention:   cohorting utilized   environmental surveillance performed   hand hygiene promoted   rest/sleep promoted  Goal: Optimal Comfort and Wellbeing  Outcome: Ongoing - Unchanged  Goal: Readiness for Transition of Care  Outcome: Ongoing - Unchanged  Goal: Rounds/Family Conference  Outcome: Ongoing - Unchanged     Problem: Fall Injury Risk  Goal: Absence of Fall and Fall-Related Injury  Outcome: Ongoing - Unchanged  Intervention: Promote Injury-Free Environment  Recent Flowsheet Documentation  Taken 08/17/2023 0745 by Ritta Slot, RN  Safety Interventions:   environmental modification   fall reduction program maintained   lighting adjusted for tasks/safety   low bed

## 2023-08-18 NOTE — Unmapped (Signed)
Copied from CRM #[id number]. Topic: Scheduling - New Appointment  >> Aug 18, 2023  9:15 AM Kenard Gower wrote:  Caller asked to schedule a new appointment  Pt is requesting for someone from the front desk to give pt a call regarding a follow up appt with pcp. Wants something asap

## 2023-08-19 ENCOUNTER — Ambulatory Visit: Admit: 2023-08-19 | Discharge: 2023-08-20 | Payer: PRIVATE HEALTH INSURANCE

## 2023-08-19 DIAGNOSIS — L259 Unspecified contact dermatitis, unspecified cause: Principal | ICD-10-CM

## 2023-08-19 DIAGNOSIS — R197 Diarrhea, unspecified: Principal | ICD-10-CM

## 2023-08-19 DIAGNOSIS — N760 Acute vaginitis: Principal | ICD-10-CM

## 2023-08-19 DIAGNOSIS — L03114 Cellulitis of left upper limb: Principal | ICD-10-CM

## 2023-08-19 MED ORDER — ZINC OXIDE 17 %-WHITE PETROLATUM 57 % TOPICAL PASTE
Freq: Four times a day (QID) | TOPICAL | 2 refills | 0.00 days | Status: CP
Start: 2023-08-19 — End: 2023-08-29

## 2023-08-19 MED ORDER — CEPHALEXIN 500 MG CAPSULE
ORAL_CAPSULE | Freq: Three times a day (TID) | ORAL | 0 refills | 7.00 days | Status: CP
Start: 2023-08-19 — End: 2023-08-26

## 2023-08-19 NOTE — Unmapped (Signed)
Spoke to [place] who is seeing patient today. She will let patient know Dr. Cathey Endow will be in touch with liver doctor about restarting Ozempic and we will contact her about an appointment.

## 2023-08-19 NOTE — Unmapped (Signed)
Assessment/Plan:        Diagnoses and all orders for this visit:    Vulvovaginitis  Comments:  scant fecal material noted in GU skin folds, pt instructed in cleaning and caring of GU given fecal incontinence  Orders:  -     Wet Prep, Genital    Contact dermatitis, unspecified contact dermatitis type, unspecified trigger  -     zinc oxide-white petrolatum 17-57 % Pste; Apply 1 Application topically four (4) times a day for 10 days.    Cellulitis of left upper extremity  -     cephalexin (KEFLEX) 500 MG capsule; Take 1 capsule (500 mg total) by mouth Three (3) times a day for 7 days.    Diarrhea, unspecified type  Comments:  prob 2/2 medications, Dr. Cathey Endow will contact Liver specialist to discuss treatment options          Assisted pt w/ cleansing of GU area w/ obs towelettes in room. Will use handheld shower head to rinse GU front to back, pat dry, then apply vaseline+zinc. Will call pt if abnl wet prep and treat appropriately.        HOPI:      Patient ID:  Denise York is a 58 y.o. female.  Here for vulvovag redness and irritation since recent hospital stay. Notes she sat in her stool for 5 hours 08/13/2023. DC 08/17/2023.      Monistat OTC has helped a little. Notes chafing upper legs and buttocks. Not better    Having to use adult diapers due to diarrhea and fecal incontinence 2/2 meds most likely.     Left arm w/ redness, swelling, warmth, and tenderness at IV site.     Dr. Cathey Endow will look for opening here for sooner pt appt and will contact her liver specialist for med discussion.      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    Obesity     Prediabetes      Past Surgical History:   Procedure Laterality Date    ABDOMINAL WALL MESH  REMOVAL  1997, 2005    Placement, 1997, removal and replacement 2005    BLADDER SURGERY      bladder tact X2    BREAST BIOPSY Right 04/2013    benign    BREAST EXCISIONAL BIOPSY Right 05/2013    benign    BREAST SURGERY Right     precancerous spot removed    CHOLECYSTECTOMY      HYSTERECTOMY      IR EMBOLIZATION VENOUS OTHER THAN HEMORRHAGE  02/17/2023    IR EMBOLIZATION VENOUS OTHER THAN HEMORRHAGE 02/17/2023 Ammie Dalton, MD IMG VIR [facility]    PR COLONOSCOPY FLX DX W/COLLJ SPEC WHEN PFRMD N/A 08/15/2023    Procedure: COLONOSCOPY, FLEXIBLE, PROXIMAL TO SPLENIC FLEXURE; DIAGNOSTIC, W/WO COLLECTION SPECIMEN BY BRUSH OR WASH;  Surgeon: Beverly Milch, MD;  Location: GI  [facility]URES [facility];  Service: Gastrointestinal    PR COLSC FLX W/RMVL OF TUMOR POLYP LESION SNARE TQ N/A 10/14/2016    Procedure: COLONOSCOPY FLEX; W/REMOV TUMOR/LES BY SNARE;  Surgeon: Alfred Levins, MD;  Location: [facility] GI PROCEDURES [place];  Service: Gastroenterology    PR COLSC FLX W/RMVL OF TUMOR POLYP LESION SNARE TQ N/A 01/15/2023    Procedure: COLONOSCOPY FLEX; W/REMOV TUMOR/LES BY SNARE;  Surgeon: Mickie Hillier, MD;  Location: GI [facility]URES [facility];  Service: Gastroenterology    PR GI IMAG INTRALUMINAL ESOPHAGUS-ILEUM W/I&Mercer Peifer N/A 08/15/2023    Procedure: GI VIDEO TRACT IMAGE INTRALUMINAL (EG, CAPSULE ENDOSCOPY), ESOPHAGUS VIA ILEUM, PHYSICIAN INTERPRETATION & REPORT;  Surgeon: Beverly Milch, MD;  Location: GI PROC[facility]URES [facility];  Service: Gastrointestinal    PR PERC CLOS,CONG INTERATRIAL COMMUN W/IMPL N/A 09/12/2022    Procedure: ASD closure;  Surgeon: Jacquelyne Balint, MD;  Location: [facility] CATH;  Service: Cardiology    PR UPPER GI ENDOSCOPY,BIOPSY N/A 01/15/2023    Procedure: UGI ENDOSCOPY; WITH BIOPSY, SINGLE OR MULTIPLE;  Surgeon: Mickie Hillier, MD;  Location: GI [facility]URES [facility];  Service: Gastroenterology    PR UPPER GI ENDOSCOPY,DIAGNOSIS N/A 08/14/2023    Procedure: UGI ENDO, INCLUDE ESOPHAGUS, STOMACH, & DUODENUM &/OR JEJUNUM; DX W/WO COLLECTION SPECIMN, BY BRUSH OR WASH;  Surgeon: Beverly Milch, MD;  Location: GI [facility]URES [facility];  Service: Gastrointestinal    SKIN BIOPSY         Allergies:  She is allergic to penicillins, shellfish containing products, glipizide, and lisinopril.    Current Medications:  Current Outpatient Medications   Medication Sig Dispense Refill    atorvastatin (LIPITOR) 20 MG tablet Take 1 tablet (20 mg total) by mouth daily. 90 tablet 3    blood sugar diagnostic (GLUCOSE BLOOD) Strp Use to test blood sugar daily 100 strip 3    blood-glucose meter kit Use to test blood sugar DAILY 1 each 1    [Paused] carvedilol (COREG) 6.25 MG tablet Take 1 tablet (6.25 mg total) by mouth two (2) times a day. Please add to pill pack 60 tablet 11    cephalexin (KEFLEX) 500 MG capsule Take 1 capsule (500 mg total) by mouth Three (3) times a day for 7 days. 21 capsule 0    cholecalciferol, vitamin D3 25 mcg, 1,000 units,, 1,000 unit (25 mcg) tablet Take 2 tablets (50 mcg total) by mouth daily.      DAILY-VITE, WITH FOLIC ACID, 400 mcg Tab tablet Take 1 tablet by mouth daily.      escitalopram oxalate (LEXAPRO) 10 MG tablet Take 1 tablet (10 mg total) by mouth daily. 90 tablet 3    furosemide (LASIX) 80 MG tablet Take 1 tablet (80 mg total) by mouth daily. Please add to pill packs 90 tablet 0    inhalational spacing device (AEROCHAMBER MV) Spcr Use as directed with inhalers 1 each 0    lactulose (CEPHULAC) 20 gram packet Take 1 packet (20 g total) by mouth Three (3) times a day.      lancets Misc Use to test blood sugar daily 100 each 3    loratadine (CLARITIN) 10 mg tablet Take 1 tablet (10 mg total) by mouth daily. 90 tablet 1    magnesium oxide (MAG-OX) 400 mg (241.3 mg elemental magnesium) tablet Take 1 tablet (400 mg total) by mouth daily. 90 tablet 3    metFORMIN (GLUCOPHAGE-XR) 500 MG 24 hr tablet Take 1 tablet (500 mg total) by mouth daily with evening meal.  90 tablet 3    multivitamin (TAB-A-VITE/THERAGRAN) per tablet Take 1 tablet by mouth daily. 90 tablet 3    naltrexone (DEPADE) 50 mg tablet Take 1 tablet (50 mg total) by mouth daily. Please add to pill packs 30 tablet 11    omeprazole (PRILOSEC) 20 MG capsule Take 1 capsule (20 mg total) by mouth Two (2) times a day (30 minutes before a meal). 180 capsule 3    psyllium seed, with sugar, (FIBER ORAL) Take 4 capsules by mouth daily. BJs brand.      rifAXIMin (XIFAXAN) 550 mg Tab Take 1 tablet (550 mg total) by mouth two (2) times a day. 180 tablet 3    spironolactone (ALDACTONE) 50 MG tablet Take 1 tablet (50 mg total) by mouth daily. 90 tablet 3    zinc oxide-white petrolatum 17-57 % Pste Apply 1 Application topically four (4) times a day for 10 days. 113 g 2     No current facility-administered medications for this visit.                   Physical Exam:     Visit Vitals  BP 126/66 (BP Site: Jordie Skalsky Arm, BP Position: Sitting, BP Cuff Size: Medium)   Pulse 60   Temp 36.7 ??C (98 ??F) (Oral)   Resp 17        Physical Exam  Chaperone present: declined.   Constitutional:       General: She is not in acute distress.     Appearance: She is obese. She is not ill-appearing or diaphoretic.   HENT:      Head: Normocephalic and atraumatic.      Nose: Nose normal.   Eyes:      General: No scleral icterus.     Conjunctiva/sclera: Conjunctivae normal.   Cardiovascular:      Rate and Rhythm: Normal rate.   Pulmonary:      Effort: Pulmonary effort is normal.   Genitourinary:     Pubic Area: Rash present.      Labia:         Right: Rash and tenderness present. No lesion or injury.         Left: Rash and tenderness present. No lesion or injury.       Vagina: No vaginal discharge.       Skin:     General: Skin is warm and dry.      Findings: Bruising and erythema present.          Neurological:      Mental Status: She is alert. Labs:  No results found for this or any previous visit (from the past 24 hours).

## 2023-08-19 NOTE — Unmapped (Addendum)
Use miconazole topical between the buttocks.     Gentle rinse of genitourinary area with plain water, pat dry. Then use vaseline ointment and creamy zinc oxide applied to all of the irritated genitourinary area.     We will call if your vaginal swab done today is abnormal and will advise on how to treat.

## 2023-08-19 NOTE — Unmapped (Signed)
Patient is currently here in acute care for vaginal irritation. She wanted to talk to Dr. Cathey Endow about her diabetes. She said she wanted a CGM so she doesn't have to stick her finger. Also, she said her BS was high at the hospital and she thinks she needs something more than metformin. Not checking sugar at home. She wanted to schedule a hospital follow up with you. Nothing available in the next 2 weeks.

## 2023-08-19 NOTE — Unmapped (Signed)
Pt stopped by and is requesting to see Dr. Cathey Endow regarding her diabetes. Advised pt the next available was May 2. Please advise if pt needs to be seen sooner?

## 2023-08-21 DIAGNOSIS — N760 Acute vaginitis: Principal | ICD-10-CM

## 2023-08-21 DIAGNOSIS — B3731 Yeast infection involving the vagina and surrounding area: Principal | ICD-10-CM

## 2023-08-21 MED ORDER — MICONAZOLE NITRATE 2 % VAGINAL CREAM
Freq: Every day | VAGINAL | 0 refills | 7.00 days | Status: CP
Start: 2023-08-21 — End: 2023-08-28

## 2023-08-21 NOTE — Unmapped (Signed)
Spoke with patient and husband on phone:     1) Informed of vag yeast and Rx for Monistat 7 at [facility] to use intravag at bedtime x 7 nights. Pt does note she has had some improvement with plan we discussed at her visit.     2) Dr. Cathey Endow will check w/ Pt's liver specialist to see if Diflucan po is a safe option in case this is easier to use/remember.     3) Dr. Cathey Endow is working on pt's long term disability from [facility] and notes pt and her husband need to meet in person w/ Deeann Saint [phone]) at [facility] [employer] to complete forms together. Pt and husband expressed understanding. Pt repeat phone number back to me correctly.

## 2023-08-22 MED ORDER — OZEMPIC 1 MG/DOSE (4 MG/3 ML) SUBCUTANEOUS PEN INJECTOR
SUBCUTANEOUS | 3 refills | 84.00 days | Status: CP
Start: 2023-08-22 — End: 2024-08-21

## 2023-08-22 MED ORDER — FLUCONAZOLE 150 MG TABLET
ORAL_TABLET | Freq: Once | ORAL | 0 refills | 1.00 days | Status: CP
Start: 2023-08-22 — End: 2023-08-22

## 2023-08-22 NOTE — Unmapped (Signed)
 Patient called to reports  I was unable to get in tough with my liver doctor.I tried.

## 2023-08-22 NOTE — Unmapped (Signed)
 I called patient about the disability forms we are completing and she said to go ahead and fax them today. She also mentioned she is still having bowel movements 6-8 times a night and wanted to know when she would be following up with you.

## 2023-08-22 NOTE — Unmapped (Signed)
 Left message on machine for patient to return my call

## 2023-08-22 NOTE — Unmapped (Signed)
 Patient said she cannot get the stool sample here today. She could do it Monday. She said she left a message for the liver specialist and when calling them was unable to talk to a human. She said she didn't have any missed calls from them where they had tried to reach her.

## 2023-08-22 NOTE — Unmapped (Signed)
 Patient contacted and voiced understanding of the message.  She will contact liver doc since she is still having 8+ bowel movements a day without lactulose. She is fine with restarting ozempic and needs it called in. Not on active med list. Thanks!

## 2023-08-22 NOTE — Unmapped (Signed)
 Patient contacted and voiced understanding of the message. She said she is concerned about the safety of restarting ozempic if it is effecting her liver but she would bring it up with the liver specialist. She said she still hasn't spoke to them but does see them for appointment next week.

## 2023-08-22 NOTE — Unmapped (Signed)
 Pls call her. Prescribing pill for yeast. Liver doctor said one was ok. Will let them know about diarrhea. Has she restarted ozempic which could help with this? Liver doctor said this was fine as well. LMB

## 2023-08-22 NOTE — Unmapped (Signed)
 We need her to return that C diff sample and if there are possible two collection containers, collect more stool for other tests. Please review with them that this needs to be brought in immediately following collection.

## 2023-08-22 NOTE — Unmapped (Signed)
 Sent in ozempic. We will not be able to get stool pathogen panel if she does not bring in sample today.

## 2023-08-25 ENCOUNTER — Ambulatory Visit: Admit: 2023-08-25 | Discharge: 2023-08-26 | Payer: PRIVATE HEALTH INSURANCE

## 2023-08-25 DIAGNOSIS — R197 Diarrhea, unspecified: Principal | ICD-10-CM

## 2023-08-26 NOTE — Unmapped (Signed)
[place] Coronary CT Angiogram (CCTA) Patient Information Form      Background on the Test:    A Coronary CT Angiogram (CCTA) is a non-invasive imaging procedure used to assess the coronary arteries--the blood vessels that supply oxygen-rich blood to the heart muscle. This test is instrumental in diagnosing coronary artery disease (CAD), including conditions such as:  Atherosclerosis (plaque buildup)  Coronary artery stenosis (narrowing of arteries)  Coronary artery anomalies  Heart attack risk assessment    During the test, a iodinated (x-ray) contrast dye is injected into the bloodstream to provide clear images of your coronary arteries. These images help doctors detect blockages or other abnormalities that may impair blood flow to the heart. The procedure is commonly used in patients who experience chest pain, shortness of breath, or are at risk for heart disease.  This is a specialized CT scan that follows your heart rate when imaging, and is offered at the following locations:  [facility]  [facility]        Why Are Medications Needed for a Coronary CTA?    To obtain clear, high-quality images during a Coronary CT Angiogram, it is essential to slow the heart rate before the scan. To do this, metoprolol (a beta blocker) and/or ivabradine (a sinus node blocker) may be prescribed to you to take prior to the scan. Nitroglycerin will also be administered at the scanner to help dilate your coronary arteries.    Taking medications prior to your appointment will reduce the procedure time considerably (from about 2 hours to about 45 minutes)        Medications to Hold Prior to the Test:    Erectile Dysfunction Medications:  Hold all erectile dysfunction medications (e.g., Viagra, Cialis, Sildenafil, Tadalafil) for at least 2 days (48 hours) prior to your test. We will administer nitroglycerin during this exam, which may interact with these medications.  You should take your regular medications unless otherwise instructed by your physician.      On the Night Before and Morning of the Test:    Hydration:  Drink plenty of water to stay well-hydrated. You do not need to be ???NPO???  Avoid Caffeine:  Do not consume any caffeinated beverages 12 hours prior to your test. These substances can interfere with the imaging process.  If You Have a Contrast Allergy:  If you have a known iodinated contrast allergy, you will need a prescription for Prednisone and Benadryl to prevent allergic reactions:  1.    Prednisone 50 mg - Take 13 hours prior to the test.  2.    Prednisone 50 mg - Take 7 hours prior to the test.  3.    Prednisone 50 mg - Take 1 hour prior to the test.  4.    Benadryl 50 mg - Take 1 hour prior to the test.  5.    Note: shellfish allergies and gadolinium allergies do NOT need this prep    Take pre-medications (Metoprolol and/or Ivabradine) as below        Heart Rate Medication Recommendations for Cardiac CT:  Please follow these instructions as discussed with your healthcare provider:  Note: if resting systolic BP is less than 110, consider holding off on outpatient pre-medications (meds can be given under nursing supervision at the scanner).    Resting Heart Rate < 50 bpm:  No medication needed.  Resting Heart Rate 50-60 bpm:  Metoprolol tartrate 25 mg PO 2 hours prior to scan.  Resting  Heart Rate 60-65 bpm:  Metoprolol tartrate 50 mg PO 2 hours prior to scan.  Resting Heart Rate 65-80 bpm:  Metoprolol tartrate 100 mg PO 2 hours prior to scan.  Alternative Medications (if contraindications to Metoprolol):  Ivabradine 15 mg PO 2 hours prior to scan  Combination Therapy for High HR (>80 bpm):  Ivabradine 15mg  PO 2 hours prior to scan in combination with metoprolol tartrate, dosed 25mg  PO the night before the scan and 25mg  PO 2 hours prior to the scan    Single doses are conveniently available for low out-of-pocket cost at the following [place] pharmacies:  -       [facility]  -       [facility]  - [facility]        After the test:  Hydration:  Drink plenty of water to help wash out the contrast  Results are generally available within 24 hours  If there is concern for a significant blockage on the scan, the study may be sent for ???FFRct??? analysis which will further assess the significance of the blockage and if it could be causing your symptoms  If FFRct is sent, there will be an extra charge. Based on your insurance type this is typically an additional diagnostic study co-pay. If you have a high-deductible insurance plan, please check with your insurer prior to the study.    For questions about the study, please contact the Cardiac Imaging Nurse Coordinator:  -             [place] Denisse Whitenack  -             [email or web address]  [phone] Coronary CT Angiogram (CCTA) Patient Information Form      Background on the Test:    A Coronary CT Angiogram (CCTA) is a non-invasive imaging procedure used to assess the coronary arteries--the blood vessels that supply oxygen-rich blood to the heart muscle. This test is instrumental in diagnosing coronary artery disease (CAD), including conditions such as:  Atherosclerosis (plaque buildup)  Coronary artery stenosis (narrowing of arteries)  Coronary artery anomalies  Heart attack risk assessment    During the test, a iodinated (x-ray) contrast dye is injected into the bloodstream to provide clear images of your coronary arteries. These images help doctors detect blockages or other abnormalities that may impair blood flow to the heart. The procedure is commonly used in patients who experience chest pain, shortness of breath, or are at risk for heart disease.  This is a specialized CT scan that follows your heart rate when imaging, and is offered at the following locations:  [facility]  [facility]        Why Are Medications Needed for a Coronary CTA?    To obtain clear, high-quality images during a Coronary CT Angiogram, it is essential to slow the heart rate before the scan. To do this, metoprolol (a beta blocker) and/or ivabradine (a sinus node blocker) may be prescribed to you to take prior to the scan. Nitroglycerin will also be administered at the scanner to help dilate your coronary arteries.    Taking medications prior to your appointment will reduce the procedure time considerably (from about 2 hours to about 45 minutes)        Medications to Hold Prior to the Test:    Erectile Dysfunction Medications:  Hold all erectile dysfunction medications (e.g., Viagra, Cialis,  Sildenafil, Tadalafil) for at least 2 days (48 hours) prior to your test. We will administer nitroglycerin during this exam, which may interact with these medications.  You should take your regular medications unless otherwise instructed by your physician.      On the Night Before and Morning of the Test:    Hydration:  Drink plenty of water to stay well-hydrated. You do not need to be ???NPO???  Avoid Caffeine:  Do not consume any caffeinated beverages 12 hours prior to your test. These substances can interfere with the imaging process.  If You Have a Contrast Allergy:  If you have a known iodinated contrast allergy, you will need a prescription for Prednisone and Benadryl to prevent allergic reactions:  1.    Prednisone 50 mg - Take 13 hours prior to the test.  2.    Prednisone 50 mg - Take 7 hours prior to the test.  3.    Prednisone 50 mg - Take 1 hour prior to the test.  4.    Benadryl 50 mg - Take 1 hour prior to the test.  5.    Note: shellfish allergies and gadolinium allergies do NOT need this prep    Take pre-medications (Metoprolol and/or Ivabradine) as below        Heart Rate Medication Recommendations for Cardiac CT:  Please follow these instructions as discussed with your healthcare provider:  Note: if resting systolic BP is less than 110, consider holding off on outpatient pre-medications (meds can be given under nursing supervision at the scanner).    Resting Heart Rate < 50 bpm:  No medication needed.  Resting Heart Rate 50-60 bpm:  Metoprolol tartrate 25 mg PO 2 hours prior to scan.  Resting Heart Rate 60-65 bpm:  Metoprolol tartrate 50 mg PO 2 hours prior to scan.  Resting Heart Rate 65-80 bpm:  Metoprolol tartrate 100 mg PO 2 hours prior to scan.  Alternative Medications (if contraindications to Metoprolol):  Ivabradine 15 mg PO 2 hours prior to scan  Combination Therapy for High HR (>80 bpm):  Ivabradine 15mg  PO 2 hours prior to scan in combination with metoprolol tartrate, dosed 25mg  PO the night before the scan and 25mg  PO 2 hours prior to the scan    Single doses are conveniently available for low out-of-pocket cost at the following [place] pharmacies:  -       [facility]  -       [facility]  -       [facility]        After the test:  Hydration:  Drink plenty of water to help wash out the contrast  Results are generally available within 24 hours  If there is concern for a significant blockage on the scan, the study may be sent for ???FFRct??? analysis which will further assess the significance of the blockage and if it could be causing your symptoms  If FFRct is sent, there will be an extra charge. Based on your insurance type this is typically an additional diagnostic study co-pay. If you have a high-deductible insurance plan, please check with your insurer prior to the study.    For questions about the study, please contact the Cardiac Imaging Nurse Coordinator:  -             [place] Lelah Rennaker  -             [email or web address]  -  [phone]

## 2023-08-26 NOTE — Unmapped (Signed)
 Informed patient C. Diff test was negative. Took lactulose 2 days ago and having 3-5 loose bowel movements a day. Has not restarted Ozempic yet. She did not want to take lactulose because she has to leave the house today for an appointment about her retirement.

## 2023-08-26 NOTE — Unmapped (Signed)
 Called patient again today in regards to getting her scheduled for her day 2 liver transplant evaluation appointments. Was able to speak with her and went over what I need to get her scheduled for. Asked her if she would be okay with me adding these appointments to when she is here next week for other appointments at [facility]. She stated yes, and that would be fine with her. Let her know that I would add appointments on 09/03/23 and 09/04/23 and that all appointment information will show in her [facility]. She stated understanding.

## 2023-08-26 NOTE — Unmapped (Signed)
 Nurse Coordinator Pre-call, No Phosphodiesterase inhibitors within the last 48 hours. Pt informed CTA may take up to 2+ hours.  Educated about Nitroglycerine and Metoprolol/Ivabradine administration purpose/side effects.  Oral hydration with water encouraged. No caffeinated beverages 12 hours prior to CTA.  Pt confirms understanding.

## 2023-08-27 NOTE — Unmapped (Signed)
 Pharmacy Pre-transplant Evaluation/ Selection Committee Note    Evaluation for liver transplant    Patient:  Denise York, Age:  58 y.o. old, Gender:  female    Allergies:  Penicillins, Shellfish containing products, Glipizide, and Lisinopril    Immunization history:    Immunization History   Administered Date(s) Administered    COVID-19 VAC,MRNA,TRIS(12Y UP)(PFIZER)(GRAY CAP) 08/19/2020    COVID-19 VACC,MRNA,(PFIZER)(PF) 10/02/2019, 10/23/2019    Covid-19 Vac, (16yr+) (Comirnaty) Mrna Pfizer  04/08/2022, 07/14/2023    HEPATITIS B VACCINE ADULT, ADJUVANTED, IM(HEPLISAV B) 06/17/2022, 09/26/2022    Hepatitis A (Adult) 06/17/2022, 09/26/2022    INFLUENZA INJ MDCK PF, QUAD,(FLUCELVAX)(54MO AND UP EGG FREE) 05/15/2021    INFLUENZA TIV (TRI) PF (IM)(HISTORICAL) 04/09/2011    INFLUENZA VACCINE IIV3(IM)(PF)6 MOS UP 06/12/2023    Influenza TRI (IIV3) 5+yrs MDV 06/04/2013    Influenza Vaccine Quad(IM)6 MO-Adult(PF) 05/08/2017, 04/09/2018, 03/11/2019    Influenza Virus Vaccine, unspecified formulation 04/08/2015, 04/07/2016, 05/04/2020, 04/08/2022    PNEUMOCOCCAL POLYSACCHARIDE 23-VALENT 07/19/2014    Pneumococcal Conjugate 20-valent 06/17/2022    SHINGRIX-ZOSTER VACCINE (HZV),RECOMBINANT,ADJUVANTED(IM) 11/09/2020, 05/15/2021    TdaP 07/16/2011, 06/17/2022       Alcohol, tobacco, illicit drug use history:  Social History     Substance and Sexual Activity   Alcohol Use No     Social History     Tobacco Use   Smoking Status Never    Passive exposure: Past   Smokeless Tobacco Never     Social History     Substance and Sexual Activity   Drug Use No       Home medications:  Current Outpatient Medications   Medication Instructions    atorvastatin (LIPITOR) 20 mg, Oral, Daily (standard)    blood sugar diagnostic (GLUCOSE BLOOD) Strp Use to test blood sugar daily    blood-glucose meter kit Use to test blood sugar DAILY    [Paused] carvedilol (COREG) 6.25 mg, Oral, 2 times a day (standard), Please add to pill pack    cholecalciferol (vitamin D3 25 mcg (1,000 units)) 50 mcg, Oral, Daily (standard)    DAILY-VITE, WITH FOLIC ACID, 400 mcg Tab tablet 1 tablet, Daily (standard)    escitalopram oxalate (LEXAPRO) 10 mg, Oral, Daily (standard)    furosemide (LASIX) 80 mg, Oral, Daily (standard), Please add to pill packs    inhalational spacing device (AEROCHAMBER MV) Spcr Use as directed with inhalers    lactulose (CEPHULAC) 20 g, Oral, 3 times a day (standard)    lancets Misc Use to test blood sugar daily    loratadine (CLARITIN) 10 mg, Oral, Daily (standard)    magnesium oxide (MAG-OX) 400 mg, Oral, Daily (standard)    metFORMIN (GLUCOPHAGE-XR) 500 mg, Oral, Daily    miconazole (MICOTIN) 2 % vaginal cream 1 applicator, Vaginal, Daily (standard)    multivitamin (TAB-A-VITE/THERAGRAN) per tablet 1 tablet, Oral, Daily (standard)    naltrexone (DEPADE) 50 mg, Oral, Daily (standard), Please add to pill packs    omeprazole (PRILOSEC) 20 mg, Oral, 2 times a day (AC)    OZEMPIC 1 mg, Subcutaneous, Every 7 days    psyllium seed, with sugar, (FIBER ORAL) 4 capsules, Daily (standard)    spironolactone (ALDACTONE) 50 mg, Oral, Daily (standard)    zinc oxide-white petrolatum 17-57 % Pste 1 Application, Topical (Top), 4 times a day       Potential pharmacotherapy related concerns for transplantation:  1. anticoagulation concerns: N/A  2. cytochrome P-450 isoenzyme-mediated drug interactions:    atorvastatin  3. other drug-interactions  with medications post-transplant:  none  4. mental health-related medication(s):    Lexapro  5. chronic pain-related medication use:  none  6. use of hormonal contraception and replacement therapy:  none  7. prior or current use of immunosuppressants:none    8. issues with drug absorption: none   9. use of herbal supplements:  none to transplant team's knowledge    Recommendations:  1. No pharmacological contraindications for transplantation    Spent 10 minutes completing medication review and addressing any pharmacological concerns with members of the multidisciplinary transplant team.    Thank you,  Vertis Kelch, PharmD

## 2023-09-03 ENCOUNTER — Inpatient Hospital Stay: Admit: 2023-09-03 | Discharge: 2023-09-04 | Payer: PRIVATE HEALTH INSURANCE

## 2023-09-04 ENCOUNTER — Ambulatory Visit: Admit: 2023-09-04 | Discharge: 2023-09-05 | Payer: PRIVATE HEALTH INSURANCE

## 2023-09-04 ENCOUNTER — Encounter: Admit: 2023-09-04 | Discharge: 2023-09-05 | Payer: PRIVATE HEALTH INSURANCE

## 2023-09-04 ENCOUNTER — Inpatient Hospital Stay: Admit: 2023-09-04 | Discharge: 2023-09-05 | Payer: PRIVATE HEALTH INSURANCE

## 2023-09-04 LAB — CBC
HEMATOCRIT: 36.3 % (ref 34.0–44.0)
HEMOGLOBIN: 12.3 g/dL (ref 11.3–14.9)
MEAN CORPUSCULAR HEMOGLOBIN CONC: 34 g/dL (ref 32.0–36.0)
MEAN CORPUSCULAR HEMOGLOBIN: 29.5 pg (ref 25.9–32.4)
MEAN CORPUSCULAR VOLUME: 86.7 fL (ref 77.6–95.7)
MEAN PLATELET VOLUME: 10.2 fL (ref 6.8–10.7)
PLATELET COUNT: 44 10*9/L — ABNORMAL LOW (ref 150–450)
RED BLOOD CELL COUNT: 4.18 10*12/L (ref 3.95–5.13)
RED CELL DISTRIBUTION WIDTH: 21.4 % — ABNORMAL HIGH (ref 12.2–15.2)
WBC ADJUSTED: 5.8 10*9/L (ref 3.6–11.2)

## 2023-09-04 LAB — COMPREHENSIVE METABOLIC PANEL
ALBUMIN: 3.1 g/dL — ABNORMAL LOW (ref 3.4–5.0)
ALKALINE PHOSPHATASE: 124 U/L — ABNORMAL HIGH (ref 46–116)
ALT (SGPT): 47 U/L (ref 10–49)
ANION GAP: 10 mmol/L (ref 5–14)
AST (SGOT): 70 U/L — ABNORMAL HIGH (ref <=34)
BILIRUBIN TOTAL: 1.8 mg/dL — ABNORMAL HIGH (ref 0.3–1.2)
BLOOD UREA NITROGEN: 17 mg/dL (ref 9–23)
BUN / CREAT RATIO: 17
CALCIUM: 10.5 mg/dL — ABNORMAL HIGH (ref 8.7–10.4)
CHLORIDE: 96 mmol/L — ABNORMAL LOW (ref 98–107)
CO2: 32 mmol/L — ABNORMAL HIGH (ref 20.0–31.0)
CREATININE: 1.01 mg/dL (ref 0.55–1.02)
EGFR CKD-EPI (2021) FEMALE: 65 mL/min/{1.73_m2} (ref >=60)
GLUCOSE RANDOM: 135 mg/dL (ref 70–179)
POTASSIUM: 4.6 mmol/L (ref 3.4–4.8)
PROTEIN TOTAL: 7.2 g/dL (ref 5.7–8.2)
SODIUM: 138 mmol/L (ref 135–145)

## 2023-09-04 LAB — FERRITIN: FERRITIN: 261.9 ng/mL (ref 7.3–270.7)

## 2023-09-04 LAB — MAGNESIUM: MAGNESIUM: 2.2 mg/dL (ref 1.6–2.6)

## 2023-09-04 LAB — PHOSPHORUS: PHOSPHORUS: 2.4 mg/dL (ref 2.4–5.1)

## 2023-09-04 LAB — HEMOGLOBIN A1C
ESTIMATED AVERAGE GLUCOSE: 117 mg/dL
HEMOGLOBIN A1C: 5.7 % — ABNORMAL HIGH (ref 4.8–5.6)

## 2023-09-04 LAB — CRYPTOCOCCAL ANTIGEN, SERUM: CRYPTOCOCCAL ANTIGEN: NEGATIVE

## 2023-09-04 LAB — PROTIME-INR
INR: 1.25
PROTIME: 14.3 s — ABNORMAL HIGH (ref 9.9–12.6)

## 2023-09-04 LAB — IRON & TIBC
IRON SATURATION: 32 % (ref 20–55)
IRON: 99 ug/dL (ref 50–170)
TOTAL IRON BINDING CAPACITY: 310 ug/dL (ref 250–425)

## 2023-09-04 MED ADMIN — metoPROLOL tartrate (Lopressor) tablet 25 mg: 25 mg | ORAL | @ 15:00:00 | Stop: 2023-09-04

## 2023-09-04 MED ADMIN — nitroglycerin (NITROLINGUAL) 0.4 mg/dose spray: SUBLINGUAL | @ 16:00:00 | Stop: 2023-09-04

## 2023-09-04 MED ADMIN — nitroglycerin (NITROLINGUAL) 0.4 mg/dose spray 2 spray: 2 | SUBLINGUAL | @ 16:00:00 | Stop: 2023-09-04

## 2023-09-04 MED ADMIN — metoPROLOL tartrate (Lopressor) 25 MG tablet: ORAL | @ 15:00:00 | Stop: 2023-09-04

## 2023-09-04 MED ADMIN — iohexol (OMNIPAQUE) 350 mg iodine/mL solution 90 mL: 90 mL | INTRAVENOUS | @ 17:00:00 | Stop: 2023-09-04

## 2023-09-04 NOTE — Unmapped (Signed)
 [facility] Visit    Reason for Visit:  post-hospital follow up for GIB  Referring Provider: Dorene Sorrow   Primary Care Provider: Cathey Endow Heywood Footman, MD    Assessment & Plan: Denise York is a 58 y.o. female with a PMHx of decompensated MASH cirrhosis (c/b portal htn, ascites, HE), viral hepatitis, CHF, and T2DM that is seen in consultation at the request of Dorene Sorrow for hospital follow up.    #GIB  #Angioectasias  Patient recently admitted 08/2023 for HE and melena. EGD and colonoscopy unremarkable. Video capsule show angiectasias throughout, multiple with stigmata of recent bleed and one bleeding. Patient had an episode of bloody stools a week ago but none since. Hgb has improved. No history of angioectasias prior to this. Given stability, will monitor symptoms for now and follow up in 6 months with repeat labs. If bleeding recurs, discussed with patient that the angioectasias noted on video capsule are far and would likely need double balloon enteroscopy. For now, will defer endoscopy. In the future, can consider medication for control such as somatostatin analogues (octreotide) or antiangiogenic agents (such as thalidomide).  -Continue to monitor symptoms  -Follow up in clinic in 6 months w/ repeat CBC and iron studies    I spent a total of 55 minutes on the date of this encounter in the delivery of care to this patient.     Patient seen and examined with Dr. Wilson Singer who is in agreement with above assessment and plan.     Return in about 6 months (around 03/13/2024) for In-person.       Subjective   HPI: Denise York is a 58 y.o. female with a PMHx of decompensated MASH cirrhosis (c/b portal htn, ascites, HE), viral hepatitis, CHF, and T2DM that is seen in consultation at the request of Dorene Sorrow for hospital follow up.    Patient was admitted from 08/13/23-08/17/23 for melena, see discharge summary for full details. She originally presented to [facility] w/ HE in setting of melena for one week after stopping lactulose due to increased bowel movements. CT A/P w/ GI bleed protocol did not show a GI bleed. She was started on octreotide, lactulose, and ceftriaxone and transferred to main. EGD on 2/6 had no acute abnormalities and no evidence of varices. Colonoscopy on 2/7 also had no signs of bleeding. Patient also had video capsule study done that showed multiple angioectasias in the small bowel, multiple of which had stigmata of recent bleeding and a single one with active bleeding; full results as below. Baseline hgb was around 11-12 and remained stable around 8. Iron studies indicated IDA so she was treated with IV iron. Patient recently seen in liver clinic, reported bloody stools on 09/04/23.     On interview, patient denied melena or hematochezia in her stool since 09/04/23. Generally feels fatigued. Husband says as long as she is on lactulose her confusion is not too much. Has frequent diarrhea from lactulose. Afraid to socialize or go anywhere due to the diarrhea.     Push enterscopy? Can consider somatostatin analogues (octreotide) and antiangiogenic agents (thalidomide). Has thrombocytopenia     The following items in the patient's electronic medical record were reviewed and updated as needed: past medical/surgical history, family history, and social history.             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)    Type 2 diabetes mellitus without complication (CMS-HCC)    Bowel incontinence    Mixed incontinence urge and stress    Encounter for long-term current use of medication    Obesity    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    Back pain    Neoplasm of uncertain behavior    Other cirrhosis of liver (CMS-HCC)    Portal hypertension (CMS-HCC)    Hepatic encephalopathy (CMS-HCC)    Thrombocytopenia (CMS-HCC)    Lower extremity edema    ASD (atrial septal defect) - s/p repair    Nausea and vomiting    Nonobstructive atherosclerosis of coronary artery    Other ascites    Pruritus    Secondary esophageal varices without bleeding (CMS-HCC)    Metabolic dysfunction-associated steatohepatitis (MASH)    Intention tremor       Medications:   Current Outpatient Medications   Medication Instructions    atorvastatin (LIPITOR) 20 mg, Oral, Daily (standard)    blood sugar diagnostic (GLUCOSE BLOOD) Strp Use to test blood sugar daily    blood-glucose meter kit Use to test blood sugar DAILY    [Paused] carvedilol (COREG) 6.25 mg, Oral, 2 times a day (standard), Please add to pill pack    cholecalciferol (vitamin D3 25 mcg (1,000 units)) 50 mcg, Daily (standard)    DAILY-VITE, WITH FOLIC ACID, 400 mcg Tab tablet 1 tablet, Daily (standard)    escitalopram oxalate (LEXAPRO) 10 mg, Oral, Daily (standard)    furosemide (LASIX) 80 mg, Oral, Daily (standard), Please add to pill packs    inhalational spacing device (AEROCHAMBER MV) Spcr Use as directed with inhalers    lactulose (CEPHULAC) 20 g, Oral, 3 times a day (standard)    lancets Misc Use to test blood sugar daily    loratadine (CLARITIN) 10 mg, Oral, Daily (standard)    magnesium oxide (MAG-OX) 400 mg, Oral, Daily (standard)    metFORMIN (GLUCOPHAGE-XR) 500 mg, Oral, Daily    multivitamin (TAB-A-VITE/THERAGRAN) per tablet 1 tablet, Oral, Daily (standard)    naltrexone (DEPADE) 50 mg, Oral, Daily (standard), Please add to pill packs    omeprazole (PRILOSEC) 20 mg, Oral, 2 times a day (AC)    OZEMPIC 1 mg, Subcutaneous, Every 7 days    psyllium seed, with sugar, (FIBER ORAL) 4 capsules, Daily (standard)    spironolactone (ALDACTONE) 50 mg, Oral, Daily (standard)        Vital Signs:  Vitals:    09/11/23 1458   BP: 117/76   BP Site: L Arm   BP Position: Sitting   Pulse: 84   Temp: 36.5 ??C (97.7 ??F)   TempSrc: Temporal   Weight: 92.5 kg (204 lb)   Height: 165.1 cm (5' 5)      Body mass index is 33.95 kg/m??.    Physical Exam:   Gen: Well appearing, NAD  CV: RRR, no murmurs  Pulm: CTA bilaterally, no crackles or wheezes  Abd: Soft, mild diffuse TP, mild distension, normal BS. No  HSM.  Ext: No edema in the bilateral lower extremities   Neuro: follows conversation, no asterixis       Labs:  Lab Results   Component Value Date    WBC 5.8 09/04/2023    HGB 12.3 09/04/2023    HCT 36.3 09/04/2023    PLT 44 (L) 09/04/2023      09/04/23:  Iron 99, TIBC 310, iron sat 32%, ferritin 261.9    08/13/23:  Iron 20, TIBC 350, iron sat 6%, ferritin 11.6    CT AP GI bleed 08/13/23:  Impression   -- No evidence of active gastrointestinal hemorrhage at the time of the examination.      --Mild peripancreatic stranding which is nonspecific and could be related to portal hypertension or acute pancreatitis. Clinical correlation is recommended.      --Hepatic cirrhosis with sequela of portal hypertension including mild splenomegaly, upper abdominal and esophageal varices, and small volume ascites.      -- Additional chronic and incidental findings as described in report findings.       EGD 08/14/23:     Impression:            - Normal esophagus.                         - Portal hypertensive gastropathy.                         - Nodular mucosa in the gastric antrum, likely healing                          from prior sites of ulceration.                         - Normal examined duodenum.                         - No specimens collected.    Colonoscopy 08/15/23:    Impression:            - Congested mucosa in the entire examined colon.                         - The examined portion of the ileum was normal.                         - No bleeding seen on exam. - No specimens collected.  Video capsule:    Impression:            - Erosive gastropathy.                         - A single angioectasia without bleeding in the                          duodenum.                         - Multiple angioectasias without bleeding in the small                          bowel.                         -  Multiple angioectasias with stigmata of recent                          bleeding in the small bowel.                         - A single angioectasia with active bleeding in the                          small bowel.                         - Stool in the colon.                         - The complete results of this study (for the same                          date as above) are available in the video capsule                          equipment database, and these results were personally                          reviewed by me.      EGD 01/15/2023:  Impression:            - Grade I esophageal varices.                         - Non-bleeding gastric ulcers with a clean ulcer base                          (Forrest Class III). Biopsied.                         - Portal hypertensive gastropathy.                         - Normal examined duodenum.

## 2023-09-04 NOTE — Unmapped (Addendum)
 [facility]  09/04/2023    Reason for visit: Follow up of Other cirrhosis of liver (CMS-HCC) [K74.69]    Assessment/Plan:    58 year old female presenting for follow up of decompensated MASH cirrhosis.  Her cirrhosis has been complicated by clinically significant portal hypertension, ascites, and hepatic encephalopathy. Ascites has responded well to diuretics, and she has not required a therapeutic paracentesis. Continues to have intermittent HE despite splenorenal shunt embolization and lactulose/Xifaxan. No history of jaundice of esophageal variceal bleeding. Her metabolic risk factors include obesity, type 2 diabetes mellitus, hypertension, and dyslipidemia.     She has indications for liver transplant but MELD is too low for a realistic chance at a deceased donor liver transplant. Her sister is interested in being evaluated as a living donor.     Other cirrhosis (CMS-HCC)  MELD 13. Completed transplant evaluation and approved for listing.  Increase protein intake, goal 100 g/day. Discussed using protein supplements such as Glucerna.  Avoid NSAIDs.  Tylenol up to 2 g/day is okay.    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  Continue lactulose, titrated to produce 3-5 soft bowel movements per day.   Continue Xifaxan twice daily.  She was advised not to drive at all due to risk of traffic accidents.    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  She will not require EGDs for esophageal variceal surveillance if she remains on carvedilol.    Other ascites (CMS-HCC)  Well-managed on diuretics. Continue Lasix 80 mg daily and spironolactone 50 mg daily.  Limit sodium to 2000 mg daily.    Pruritus  Resolved on naltrexone 50 mg daily.     Lower extremity edema  Continue diuretics as noted above.  Limit sodium to 2000 mg daily.   Discussed use of compression stockings.    MASH  Continue weight loss efforts.    Resume Ozempic. Discussed that it is not contraindicated in cirrhosis and will help manage T2DM.  Continue management of metabolic risk factors with PCP.    Rectal bleeding  Likely hemorrhoidal. Anemia has resolved. Would expect melena and decreased Hgb if bleeding from small bowel AVMs. If progressive anemia or melena, consider single balloon enteroscopy.    Diarrhea  Unknown etiology. C. Diff negative. Consider GI pathogen panel.  Stop Magnesium and fiber gummies. If diarrhea persists, consider stopping naltrexone as it can cause diarrhea.    No follow-ups on file.    Subjective   History of Present Illness   Accompanied by: sisters Denise York and Denise York    58 y.o. 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 Her cirrhosis has been complicated by clinically significant portal hypertension, ascites, and hepatic encephalopathy. EGD 01/15/23, G1 EV and non bleeding gastric ulcers (H. Pylori negative). 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     Interval History  Last visit 07/14/2023. Admitted 2/5-08/17/23 with melena and hepatic encephalopathy. EGD and colonoscopy without evidence of bleeding. Capsule endoscopy with multiple angioectasias with stigmata of recent bleeding in the small bowel and a single angioectasia with active bleeding in the small bowel. Had melena a couple days after discharge but none since that time. Notes episode of red rectal bleeding today, approximately 1 handful of blood plus blood in the toilet bowl. Denies shortness  of breath, chest pain, dizziness. Undergoing transplant evaluation. To be listed active pending insurance approval, and at least one of the following:  increase in MELD or the approval of a living donor. Denies abdominal pain or distention. Has not taken lactulose today given increased stool frequency/diarrhea. Notes feeling fatigued, has good days and bad days in regard to HE. Notes mild lower extremity edema, continues Lasix 80 mg daily and spironolactone 50 mg daily. Ascites well-controlled on diuretics.  Itching has improved with naltrexone. Sister Denise York interested in being a living donor. Sisters note they had patient stop Ozempic due to concerns about Ozempic and liver disease.    Objective   Physical Exam   Vital Signs: BP 102/58  - Pulse 64  - Temp 36.1 ??C (97 ??F) (Tympanic)  - Ht 165.1 cm (5' 5)  - Wt 93.3 kg (205 lb 9.6 oz)  - SpO2 98%  - BMI 34.21 kg/m??   Constitutional: She is in no apparent distress  Eyes: Anicteric sclerae  Cardiovascular: Mild to moderate peripheral edema bilaterally  Gastrointestinal: Soft, non-tender, non-distended abdomen with splenomegaly  Neurologic: Awake, alert, and oriented to person, place, and time with normal speech; asterixis present    Results for orders placed or performed during the hospital encounter of 09/04/23   Phosphorus Level   Result Value Ref Range    Phosphorus 2.4 2.4 - 5.1 mg/dL   Iron & TIBC   Result Value Ref Range    Iron 99 50 - 170 ug/dL    TIBC 161 096 - 045 ug/dL    Iron Saturation (%) 32 20 - 55 %   Ferritin   Result Value Ref Range    Ferritin 261.9 7.3 - 270.7 ng/mL   Hemoglobin A1c   Result Value Ref Range    Hemoglobin A1C 5.7 (H) 4.8 - 5.6 %    Estimated Average Glucose 117 mg/dL   Syphilis Screen   Result Value Ref Range    RPR Nonreactive Nonreactive   Cryptococcal Antigen, Serum   Result Value Ref Range    Crypto Ag Negative Negative   CBC   Result Value Ref Range    WBC 5.8 3.6 - 11.2 10*9/L    RBC 4.18 3.95 - 5.13 10*12/L    HGB 12.3 11.3 - 14.9 g/dL    HCT 40.9 81.1 - 91.4 %    MCV 86.7 77.6 - 95.7 fL    MCH 29.5 25.9 - 32.4 pg    MCHC 34.0 32.0 - 36.0 g/dL    RDW 78.2 (H) 95.6 - 15.2 %    MPV 10.2 6.8 - 10.7 fL    Platelet 44 (L) 150 - 450 10*9/L   PT-INR   Result Value Ref Range    PT 14.3 (H) 9.9 - 12.6 sec    INR 1.25    Comprehensive Metabolic Panel   Result Value Ref Range    Sodium 138 135 - 145 mmol/L    Potassium 4.6 3.4 - 4.8 mmol/L    Chloride 96 (L) 98 - 107 mmol/L    CO2 32.0 (H) 20.0 - 31.0 mmol/L    Anion Gap 10 5 - 14 mmol/L    BUN 17 9 - 23 mg/dL    Creatinine 2.13 0.86 - 1.02 mg/dL    BUN/Creatinine Ratio 17     eGFR CKD-EPI (2021) Female 65 >=60 mL/min/1.26m2    Glucose 135 70 - 179 mg/dL    Calcium 57.8 (H) 8.7 - 10.4 mg/dL  Albumin 3.1 (L) 3.4 - 5.0 g/dL    Total Protein 7.2 5.7 - 8.2 g/dL    Total Bilirubin 1.8 (H) 0.3 - 1.2 mg/dL    AST 70 (H) <=67 U/L    ALT 47 10 - 49 U/L    Alkaline Phosphatase 124 (H) 46 - 116 U/L   Magnesium Level   Result Value Ref Range    Magnesium 2.2 1.6 - 2.6 mg/dL   Type, Blood (ABO UNOS)   Result Value Ref Range    ABO Grouping A POS    POCT Creatinine   Result Value Ref Range    Creatinine Whole Blood, POC 1.1 0.7 - 1.1 mg/dL    eGFR CKD-EPI (6195) Female 59 (L) >=60 mL/min/1.62m2   Results for orders placed or performed in visit on 09/04/23   ECG 12 Lead   Result Value Ref Range    EKG Systolic BP  mmHg    EKG Diastolic BP  mmHg    EKG Ventricular Rate 56 BPM    EKG Atrial Rate 56 BPM    EKG P-R Interval 182 ms    EKG QRS Duration 88 ms    EKG Q-T Interval 528 ms    EKG QTC Calculation 509 ms    EKG Calculated P Axis 36 degrees    EKG Calculated R Axis 21 degrees    EKG Calculated T Axis 25 degrees    QTC Fredericia 516 ms      MELD 3.0: 13 at 09/04/2023  1:56 PM  Calculated from:  Serum Creatinine: 1.01 mg/dL at 0/93/2671  2:45 PM  Serum Sodium: 138 mmol/L (Using max of 137 mmol/L) at 09/04/2023  1:56 PM  Total Bilirubin: 1.8 mg/dL at 02/14/9832  8:25 PM  Serum Albumin: 3.1 g/dL at 0/53/9767  3:41 PM  INR(ratio): 1.25 at 09/04/2023  1:56 PM  Age at listing (hypothetical): 52 years  Sex: Female at 09/04/2023  1:56 PM    I personally spent 60 minutes face-to-face and non-face-to-face in the care of this patient, which includes all pre, intra, and post visit time on the date of service.

## 2023-09-04 NOTE — Unmapped (Signed)
 Patient reports bloody stool starting today.

## 2023-09-04 NOTE — Unmapped (Addendum)
 [facility]  [facility]  Dorene Sorrow, [place]   [facility]  [address]  [place], [place] [address]  Main Clinic: [phone]  Appointment Schedulers: [phone]  Iver Nestle, RN: [phone]  Fax: [phone]       Thank you for allowing me to participate in your medical care today. Here are my recommendations based on today's visit:    Have your family bring you to the [facility] ED if you have worsening/persistent fatigue or worsening rectal bleeding.   Try stopping fiber and magnesium to see if this helps with diarrhea.  Adjust lactulose dose or frequency to produce a target of 3-4 soft bowel movements a day.  You should not drive at all due to the increased risk of traffic accidents.    Take care,  Dorene Sorrow, PA

## 2023-09-05 LAB — HSV ANTIBODIES, IGG
HERPES SIMPLEX VIRUS 1 IGG: POSITIVE — AB
HERPES SIMPLEX VIRUS 2 IGG: NEGATIVE
HSV 1 IGG OD: 29.9

## 2023-09-05 LAB — VARICELLA ZOSTER ANTIBODY, IGG: VARICELLA ZOSTER IGG: POSITIVE

## 2023-09-05 LAB — TRANSPLANT IMMUNE STATUS - EBV: EPSTEIN-BARR VCA IGG ANTIBODY: POSITIVE — AB

## 2023-09-05 LAB — RUBELLA ANTIBODY, IGG: RUBELLA IGG SCREEN: POSITIVE

## 2023-09-05 LAB — SYPHILIS SCREEN: SYPHILIS RPR SCREEN: NONREACTIVE

## 2023-09-05 LAB — TOXOPLASMA GONDII ANTIBODY, IGG: TOXOPLASMA GONDII IGG: NEGATIVE

## 2023-09-09 NOTE — Unmapped (Signed)
 [facility] [phone], spoke with Britta Mccreedy. Liver transplant listing case has been initiated. Pending case ref# [id number]. Clinicals required. CM Maggie ext. [phone] is out, Kinnie Scales is covering. Fax# [phone]. Cover sheet should included the pending case ref#.   Email sent to [facility] Corrie Fair.

## 2023-09-10 LAB — TB AG2: TB AG2 VALUE: 0.06

## 2023-09-10 LAB — TB AG1: TB AG1 VALUE: 0.03

## 2023-09-10 LAB — QUANTIFERON TB GOLD PLUS
QUANTIFERON ANTIGEN 1 MINUS NIL: 0 [IU]/mL
QUANTIFERON ANTIGEN 2 MINUS NIL: 0.03 [IU]/mL
QUANTIFERON MITOGEN: 6.09 [IU]/mL
QUANTIFERON TB GOLD PLUS: NEGATIVE
QUANTIFERON TB NIL VALUE: 0.03 [IU]/mL

## 2023-09-10 LAB — TB NIL: TB NIL VALUE: 0.03

## 2023-09-10 LAB — TB MITOGEN: TB MITOGEN VALUE: 6.12

## 2023-09-10 NOTE — Unmapped (Signed)
 Called patient in regards to getting her scheduled for an appointment with the Transplant Financial Coordinator/Asheton Clark. Let her know that she will not need to come to the Clinic for this appointment and that this appointment will be done over the phone. She stated understanding. Let her know the next available appointments, starting with tomorrow-Thursday, March 6th. She chose Monday, March 10th at 1:00pm due to her having an appointment already scheduled for the 6th. Told her this appointment information will show in her [facility]. She stated understanding.

## 2023-09-11 ENCOUNTER — Ambulatory Visit
Admit: 2023-09-11 | Discharge: 2023-09-12 | Payer: PRIVATE HEALTH INSURANCE | Attending: Student in an Organized Health Care Education/Training Program | Primary: Student in an Organized Health Care Education/Training Program

## 2023-09-11 DIAGNOSIS — K5900 Constipation, unspecified: Principal | ICD-10-CM

## 2023-09-11 DIAGNOSIS — K31819 Angiodysplasia of stomach and duodenum without bleeding: Principal | ICD-10-CM

## 2023-09-11 DIAGNOSIS — R112 Nausea with vomiting, unspecified: Principal | ICD-10-CM

## 2023-09-11 NOTE — Unmapped (Addendum)
 We saw you for hospital follow up for your gastrointestinal bleeding. Your bleeding was seen on the camera you swallowed and was caused by what we call angioectasias. These are poorly formed vessels that have the risk of bleeding. They can come and go. Given that your recent labs show improvement in your red blood cells and you are doing well overall, we can follow up in 6 months with repeat labs. We can consider doing a scope to treat these lesions or give medication to help control them in the long term. If you get a lot of blood in your stool or start getting light headed, go to the emergency department for evaluation.

## 2023-09-12 NOTE — Unmapped (Unsigned)
 Ondansetron not on active med list. Should she be taking? Xifaxan is handled by specialist. Do you want to forward request to them or have patient contact them?

## 2023-09-15 ENCOUNTER — Ambulatory Visit: Admit: 2023-09-15 | Discharge: 2023-09-16 | Payer: PRIVATE HEALTH INSURANCE

## 2023-09-15 DIAGNOSIS — K7682 Hepatic encephalopathy (CMS-HCC): Principal | ICD-10-CM

## 2023-09-15 MED ORDER — RIFAXIMIN 550 MG TABLET
ORAL_TABLET | Freq: Two times a day (BID) | ORAL | 3 refills | 90.00 days | Status: CP
Start: 2023-09-15 — End: 2024-09-14
  Filled 2023-09-16: qty 180, 90d supply, fill #0

## 2023-09-15 MED ORDER — ONDANSETRON 4 MG DISINTEGRATING TABLET
ORAL_TABLET | Freq: Three times a day (TID) | 3 refills | 40.00 days | Status: CP | PRN
Start: 2023-09-15 — End: 2024-02-22

## 2023-09-15 NOTE — Unmapped (Signed)
 Clinical Social Worker Telephone Note    Name:Denise York  Date of Birth:[birth year]  ZOX:[id number]    REFERRAL INFORMATION:  Denise York is in evaluation for liver transplantation .     IMPRESSION:   Patient answered phone and it sounded like CSW woke her up. Patient states that she is dealing with a horrible headache and CSW offered to call her back later to discuss MyChart message that was sent (re: [facility]LD[facility]). Patient expressed appreciation.     11:10 AM  09/18/23  Spoke with patient and explained NLDAC recipient application.       Flint Melter, LCSW  Transplant Case Manager  09/15/2023. 10:41 AM

## 2023-09-15 NOTE — Unmapped (Signed)
 TFC spoke with patient, Denise York, to discuss insurance benefits related to Liver transplant. Denise York verbalized understanding of financial agreement for transplant.    As part of this consultation Denise York was advised to:  Consider fundraising to help with transplant expenses    TFC spoke with patient briefly about travel and lodging and let her know someone from [employer] health plan would let her know if she qualifies. Patient stated she has been speaking with them weekly and have not talked about travel but will be sure to ask.Marland KitchenMarland Kitchen

## 2023-09-15 NOTE — Unmapped (Signed)
 [facility] Refill Coordination Note    Specialty Lite Medication(s) to be Shipped:   Xifaxan    Other medication(s) to be shipped: No additional medications requested for fill at this time     Denise York, DOB: 20-Nov-1965  Phone: [phone] (home) [phone] (work)      All above HIPAA information was verified with patient.     Was a Nurse, learning disability used for this call? No    Changes to medications: Denise York reports no changes at this time.  Changes to insurance: No      REFERRAL TO PHARMACIST     Referral to the pharmacist: Not needed      [facility]     Shipping address confirmed in Epic.     Delivery Scheduled: Yes, Expected medication delivery date: 09/17/23.     Medication will be delivered via UPS to the prescription address in Epic WAM.    Denise York Vangie Bicker, PharmD   [facility] Delivery Pharm[facility] Specialty Pharmacist

## 2023-09-16 NOTE — Unmapped (Signed)
 Insurance has been verified      Patient has active coverage with [facility]), including prescription drug coverage via [employer] [phone]     Authorization is [id number] for Liver transplant listing valid 09/15/23 to 03/17/25      Patient is financially cleared for Liver transplant listing

## 2023-09-16 NOTE — Unmapped (Signed)
 Called patient in regards to getting her scheduled with Transplant Infectious Disease for her evaluation. She stated understanding. Went over the next available dates and she chose the first available date of 10/08/23 at 10:00am. Let her know that this appointment information will show in her [facility]. Again she stated understanding.

## 2023-09-17 MED ORDER — ONDANSETRON 4 MG DISINTEGRATING TABLET
ORAL_TABLET | Freq: Three times a day (TID) | 3 refills | 40.00 days | PRN
Start: 2023-09-17 — End: 2024-02-24

## 2023-09-17 NOTE — Unmapped (Signed)
 Copied from CRM #[id number]. Topic: Access To Clinicians - Medication Question  >> Sep 17, 2023 10:25 AM Doy Mince wrote:  Medication sent to wrong pharmacy. Patient requesting ondansetron (ZOFRAN-ODT) 4 MG disintegrating tablet sent to [facility] in chart. Any questions call patient at # [phone].

## 2023-09-17 NOTE — Unmapped (Signed)
 Addended by: Loralee Pacas on: 09/17/2023 10:31 AM     Modules accepted: Orders

## 2023-09-18 NOTE — Unmapped (Signed)
???   We did not send this in. Has [facility] already sent it in? We need to direct this to Stan Head

## 2023-09-18 NOTE — Unmapped (Signed)
 Denise York is her case Administrator, sports with [employer]. She will contact Marchelle Folks office to make sure this is sent to the correct pharmacy. No further action required.

## 2023-09-18 NOTE — Unmapped (Signed)
 Alcide Goodness the Nurse called stating medication was sent to the wrong pharmacy  Patient requesting ondansetron (ZOFRAN-ODT) 4 MG disintegrating tablet sent to [facility]. Kayla Nurse phone number is [phone] call if you have any questions.

## 2023-09-18 NOTE — Unmapped (Signed)
 Spoke to [place] and she stated I made a duplicate call, she just spoke to someone in the office. No further action needed.

## 2023-09-19 MED ORDER — LACTULOSE 20 GRAM ORAL PACKET
Freq: Three times a day (TID) | ORAL | 0 refills | 10.00 days
Start: 2023-09-19 — End: 2023-10-19

## 2023-09-22 MED ORDER — LACTULOSE 20 GRAM ORAL PACKET
PACK | Freq: Three times a day (TID) | ORAL | 3 refills | 90.00 days | Status: CP
Start: 2023-09-22 — End: 2024-09-21

## 2023-09-22 NOTE — Unmapped (Signed)
 Dr. Cathey Endow this RX historically looks like it has been prescribed when the patient has been admitted to the hospital the past 3 times. Is this something you have greed to take over or does the patient need an apt or to contact a specialist for the refill?    Unable to complete refill request, medication is not included in the refill protocol.   Please review below request.     Patient is requesting the following refill  Requested Prescriptions     Pending Prescriptions Disp Refills    lactulose (CEPHULAC) 20 gram packet 30 each 0     Sig: Take 1 packet (20 g total) by mouth Three (3) times a day.       Recent Visits  Date Type Provider Dept   08/19/23 Office Visit Truddie Coco, PA [facility]   08/12/23 Office Visit Bowen, Heywood Footman, MD [facility]   07/25/23 Telemedicine Bowen, Heywood Footman, MD [facility]   07/15/23 Office Visit Bowen, Heywood Footman, MD [facility]   06/27/23 Office Visit Bowen, Heywood Footman, MD [facility]   06/12/23 Office Visit Bowen, Heywood Footman, MD [facility]   11/21/22 Office Visit Bowen, Heywood Footman, MD [facility] [place]   Showing recent visits within past 365 days and meeting all other requirements  Future Appointments  No visits were found meeting these conditions.  Showing future appointments within next 365 days and meeting all other requirements       Labs: Not applicable this refill

## 2023-09-24 NOTE — Unmapped (Signed)
 [facility]  New Patient Evaluation & Consultation    Referring Provider: Kennedy Bucker, MD  PCP: Marca Ancona, MD  Date of Service: 09/30/2023    SUBJECTIVE  Chief Complaint: Establish Care (rUTI)    History of Present Illness: Denise York is a 58 y.o. female seen in consultation at the request of Dr. Eudelia Bunch for evaluation of recurrent UTI. She reports that she was referred by her liver doctor for UTIs and to be evaluated prior to her transplant. She has a history of a hysterectomy for hemorrhage after delivery. She then had prolapse and had repair using mesh and then had bothersome leakage with a different surgery for her leakage. She denies any prolapse/bulge. She reports fecal incontinence that she relates to her medication for her liver and associates the 2 infections she had to times when she was in the hospital with FI with 1 instance even having her sit in stool for over an hour and a half. She denies bothersome urinary incontinence and reports 1 panty liner per day. She reports since starting ozempic, she has not had any FI and has not had an infection since. 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      Review of records significant for:  09/04/23 A1C 5.7  08/18/22 yeast on wet prep    08/15/23 MUF  08/13/23 <1RBC on urinalysis   07/13/23 >100,000 e coli R amp, amp+sulbactam, I augmentin, cefazolin  05/31/23 >100,000 e coli R amp, amp+sulbactam, I cefazolin    PMHx of decompensated MASH cirrhosis (c/b portal htn, ascites, HE), viral hepatitis, CHF, and T2DM     Breast cancer history 2014    Is currently being evaluated for possible liver transplant      Urinary Symptoms:  Stress urinary incontinence: yes.   Urgency urinary incontinence: yes.   Leaks sometimes  Pad use: 1 mini pads per day.    She is not bothered by her UI symptoms.    Nocturia: 3 times per night to void.  Splinting to void: no Voiding dysfunction: she empties her bladder well.     UTIs:  0  UTI's in the last year.    She denies any history of kidney stones.     Pelvic Organ Prolapse Symptoms:                   She denies a feeling of a bulge the vaginal area.   She denies seeing a bulge.     Bowel Symptom:  Bowel movements: 4 time(s) per day(s).    Stool consistency: soft.    She denies accidental bowel leakage / fecal incontinence  Straining: no.   Splinting: no.   Incomplete evacuation: no.   Fecal urgency: no.     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     Past Surgical History: She  has a past surgical history that includes Breast surgery (Right); Hysterectomy; Bladder surgery; Cholecystectomy; Abdominal Madai Nuccio mesh  removal (1997, 2005); Skin biopsy; pr colsc flx w/rmvl of tumor polyp lesion snare tq (N/A, 10/14/2016); Breast biopsy (Right, 04/2013); Breast excisional biopsy (Right, 05/2013); pr perc clos,cong interatrial commun w/impl (N/A, 09/12/2022); pr  upper gi endoscopy,biopsy (N/A, 01/15/2023); pr colsc flx w/rmvl of tumor polyp lesion snare tq (N/A, 01/15/2023); IR Embolization - Venous Non-Hemorrhage (02/17/2023); pr upper gi endoscopy,diagnosis (N/A, 08/14/2023); pr colonoscopy flx dx w/collj spec when pfrmd (N/A, 08/15/2023); and pr gi imag intraluminal esophagus-ileum w/i&r (N/A, 08/15/2023).     Past OB/GYN History:  G2 P2  Vaginal deliveries: 2  Cesarean section:  0    She is menopausal.    She is not sexually active.   She does not have dyspareunia.    Last pap smear was 2018 NILM HPV-.  Any history of abnormal pap smears: no.  Last colonoscopy was 08/15/23.     Medications: She has a current medication list which includes the following prescription(s): atorvastatin, glucose blood, blood-glucose meter, [Paused] carvedilol, cholecalciferol (vitamin d3 25 mcg (1,000 units)), daily-vite (with folic acid), escitalopram oxalate, furosemide, aerochamber mv, lactulose, lancets, loratadine, magnesium oxide, metformin, multivitamin, naltrexone, omeprazole, ondansetron, psyllium seed (with sugar), rifaximin, ozempic, and spironolactone.   Allergies: Patient is allergic to penicillins, shellfish containing products, glipizide, and lisinopril.     Social History: Patient  reports that she has never smoked. She has been exposed to tobacco smoke. She has never used smokeless tobacco. She reports that she does not drink alcohol and does not use drugs. She lives with husband.  She is not employed.  Family History: family history includes Alcohol abuse in her maternal uncle; Allergies in her father and another family member; Birth defects in her son and son; COPD in her mother; Coronary artery disease in an other family member; Diabetes in her father; Gout in her father; Heart disease in her father and mother; Hypertension in her mother; Kidney disease in her father; Kidney failure in an other family member; No Known Problems in her daughter, maternal grandfather, maternal grandmother, paternal grandfather, paternal grandmother, and sister.     Review of Systems: Otherwise a 10 system review of systems was negative with positives as noted in the HPI.    OBJECTIVE  Physical Exam:  Vitals:    09/30/23 1310   BP: 119/80   BP Site: R Arm   BP Position: Sitting   BP Cuff Size: Large   Pulse: 97   Weight: 94.8 kg (209 lb)   Height: 165.1 cm (5' 5)     Constitutional:  Well appearing female in no acute distress.   Psych:  Normal mood and affect.   Neuro: Alert and oriented x 3.  Respiratory:  Normal respiratory effort.   Cardiovascular:  No lower extremity edema.   Skin:  Warm and dry. No rashes/lesions visible.   Lymphatic:  No inguinal lymphadenopathy. Gastrointestinal:  No abdominal tenderness.  No rebound/guarding.  Musculoskeletal: Ambulatory, moves all limbs without difficulty, extremities warm and well perfused    Prior to the outpatient exam, consent was obtained from the patient prior to the sensitive portion of the exam. A medical student was not involved      GU / Detailed Urogynecologic Evaluation:   Pelvic Exam: Normal external female genitalia; Bartholin's and Skene's glands normal in appearance; urethral meatus normal in appearance, no urethral masses or discharge.     s/p hysterectomy: Speculum exam reveals normal vaginal mucosa without atrophy and normal vaginal cuff.  Adnexa normal adnexa.   No visible mesh exposure. Mesh palpated along anterior Jordin Dambrosio but under tissue and not exposed. Non-tender  -CST,-VST  No levator or obturator tenderness      Pelvic floor strength I/V  09/30/2023     1:41 PM 11/21/2014     2:51 PM   POP-Q Exams   gh 4.5 5   pb 2.5 3   tvl 6 7   aa -2 -2   ba -2 -2   ap -2 -3   bp -2 -3   c -5 -6.5   d  --       Medical chaperone present for exam: Celso Sickle, CMA    Post-Void Residual (PVR) by Bladder Scan:  In order to evaluate bladder emptying, we discussed obtaining a postvoid residual and she agreed to this procedure.    Procedure: The ultrasound unit was placed on the patient???s abdomen in the suprapubic region after the patient had voided. A PVR of 18 ml was obtained by bladder scan.    Laboratory Results:  Results for orders placed or performed in visit on 09/30/23   POCT Urinalysis Dipstick   Result Value Ref Range    Spec Gravity/POC >=1.030 1.003 - 1.030    PH/POC 6.0 5.0 - 9.0    Leuk Esterase/POC Negative Negative    Nitrite/POC Negative Negative    Protein/POC Trace (A) Negative    UA Glucose/POC Negative Negative    Ketones, POC Negative Negative    Bilirubin/POC Negative Negative    Blood/POC Negative Negative    Urobilinogen/POC 1.0 0.2 - 1.0 mg/dL        I visualized the urine specimen, noting the specimen to be urine color: clear yellow    ASSESSMENT AND PLAN  Denise York is a 58 y.o. with:   1. Recurrent UTI    2. Liver transplant candidate    3. End stage liver disease (CMS-HCC)        Recurrent UTIs likely related to previous FI   -Reviewed the diagnostic criteria for recurrent UTI (3 or more culture proven UTIs in the last 12 months or 2 in the last 6 months). She does meet criteria for recurrent UTI based on this definition.  -Discussed the importance of having a urine culture sent each time she has UTI specific symptoms. We discussed that UTI specific symptoms are usually acute (or acute change from baseline if she has lower urinary tract symptoms at baseline), and usually involve bladder related symptoms of UTI, which may include urgency, frequency, pain with urination, or sudden worsening of your normal bladder issues, or systemic symptoms including fevers, white count, chills, sudden one-sided upper back pain, sudden pain in the lower abdomen, nausea, vomiting.  -I instructed the patient that she have a copy of the outside urine culture results sent to our office. Records requested today.  -For UTI prevention, we will use the following regimen:  -drinking at least 45-60 oz of water daily   -voiding every 2-3 hours  -the 2 UTIs present are likely related to FI and she reports improved stool consistency since starting ozempic. No UTIs since 07/2023. She will call or message if she develops another infection.   -if UTIs persist, can add additional prophylaxis    No significant prolapse, normal PVR, no mesh exposure on exam.     Marica Otter FNP    I personally spent 60 minutes face-to-face and non-face-to-face in the care of this patient, which includes all pre, intra, and post visit time on the date of service.  All documented time was specific to the E/M visit and does not include any procedures that may have been performed.

## 2023-09-25 ENCOUNTER — Other Ambulatory Visit: Payer: Self-pay | Admitting: Family Medicine

## 2023-09-25 DIAGNOSIS — Z1231 Encounter for screening mammogram for malignant neoplasm of breast: Secondary | ICD-10-CM

## 2023-09-25 NOTE — Unmapped (Signed)
 Copied from CRM #[id number]. Topic: Scheduling - New Appointment  >> Sep 25, 2023  3:08 PM Franki Cabot wrote:  Caller asked to schedule a new appointment      Additional Requests/Needs: Yes Appointment Related: Pt needs pap done by April 3rd in order to get liver transplant per transplant coordinator.  The guardian, [name],  preferred contact: Cell Phone Urgent callback turnaround time: within 24 business hours. Programmer, systems Notified)

## 2023-09-25 NOTE — Unmapped (Signed)
 Spoke with patient sister about outstanding items for transplant:    Allergy appt-canceled due to hospitalization.  Sister will reschedule.    Most recent pap smear per chart review is was 2018.  Asked sister to reschedule with PCP asap.    All other outstanding items have been addressed-Urogyn appt 3/24 and Transplant ID appointment on 4/2.

## 2023-09-27 DIAGNOSIS — K7460 Unspecified cirrhosis of liver: Principal | ICD-10-CM

## 2023-09-27 DIAGNOSIS — K7581 Nonalcoholic steatohepatitis (NASH): Principal | ICD-10-CM

## 2023-09-27 MED ORDER — OMEPRAZOLE 20 MG CAPSULE,DELAYED RELEASE
ORAL_CAPSULE | Freq: Two times a day (BID) | ORAL | 1 refills | 0 days
Start: 2023-09-27 — End: ?

## 2023-09-29 ENCOUNTER — Ambulatory Visit: Admit: 2023-09-29 | Discharge: 2023-09-30 | Payer: PRIVATE HEALTH INSURANCE

## 2023-09-29 DIAGNOSIS — K7469 Other cirrhosis of liver: Principal | ICD-10-CM

## 2023-09-29 DIAGNOSIS — K31819 Angiodysplasia of stomach and duodenum without bleeding: Principal | ICD-10-CM

## 2023-09-29 LAB — CBC
HEMATOCRIT: 39.3 % (ref 34.0–44.0)
HEMOGLOBIN: 13.6 g/dL (ref 11.3–14.9)
MEAN CORPUSCULAR HEMOGLOBIN CONC: 34.6 g/dL (ref 32.0–36.0)
MEAN CORPUSCULAR HEMOGLOBIN: 30.7 pg (ref 25.9–32.4)
MEAN CORPUSCULAR VOLUME: 88.9 fL (ref 77.6–95.7)
MEAN PLATELET VOLUME: 10.6 fL (ref 6.8–10.7)
PLATELET COUNT: 44 10*9/L — ABNORMAL LOW (ref 150–450)
RED BLOOD CELL COUNT: 4.42 10*12/L (ref 3.95–5.13)
RED CELL DISTRIBUTION WIDTH: 19.9 % — ABNORMAL HIGH (ref 12.2–15.2)
WBC ADJUSTED: 6.4 10*9/L (ref 3.6–11.2)

## 2023-09-29 LAB — IRON & TIBC
IRON SATURATION: 86 % — ABNORMAL HIGH (ref 20–55)
IRON: 268 ug/dL — ABNORMAL HIGH (ref 50–170)
TOTAL IRON BINDING CAPACITY: 312 ug/dL (ref 250–425)

## 2023-09-29 LAB — COMPREHENSIVE METABOLIC PANEL
ALBUMIN: 3.1 g/dL — ABNORMAL LOW (ref 3.4–5.0)
ALKALINE PHOSPHATASE: 154 U/L — ABNORMAL HIGH (ref 46–116)
ALT (SGPT): 75 U/L — ABNORMAL HIGH (ref 10–49)
ANION GAP: 12 mmol/L (ref 5–14)
AST (SGOT): 106 U/L — ABNORMAL HIGH (ref <=34)
BILIRUBIN TOTAL: 2.6 mg/dL — ABNORMAL HIGH (ref 0.3–1.2)
BLOOD UREA NITROGEN: 22 mg/dL (ref 9–23)
BUN / CREAT RATIO: 19
CALCIUM: 10.2 mg/dL (ref 8.7–10.4)
CHLORIDE: 103 mmol/L (ref 98–107)
CO2: 27 mmol/L (ref 20.0–31.0)
CREATININE: 1.16 mg/dL — ABNORMAL HIGH (ref 0.55–1.02)
EGFR CKD-EPI (2021) FEMALE: 55 mL/min/{1.73_m2} — ABNORMAL LOW (ref >=60)
GLUCOSE RANDOM: 127 mg/dL (ref 70–179)
POTASSIUM: 3.9 mmol/L (ref 3.5–5.1)
PROTEIN TOTAL: 7.2 g/dL (ref 5.7–8.2)
SODIUM: 142 mmol/L (ref 135–145)

## 2023-09-29 LAB — FERRITIN: FERRITIN: 161.3 ng/mL (ref 7.3–270.7)

## 2023-09-29 LAB — PROTIME-INR
INR: 1.24
PROTIME: 14.1 s — ABNORMAL HIGH (ref 9.9–12.6)

## 2023-09-29 NOTE — Unmapped (Signed)
 Per provider, the patient received PCV21 (Capvaxive) (Pneumococcal 21-valent Conjugate Vaccine)  vaccine.  Patient ID verified with name and date of birth.  All screening questions were answered.  Vaccine(s) were administered as ordered.  See immunization history for documentation.  Patient tolerated the injection(s) well with no issues noted.  Vaccine Information sheet given to the patient.

## 2023-09-29 NOTE — Unmapped (Signed)
 [facility]  [facility]  Dorene Sorrow, [place]   [facility]  [address]  [place], [place] [address]  Main Clinic: [phone]  Appointment Schedulers: [phone]  Iver Nestle, RN: [phone]  Fax: [phone]       Thank you for allowing me to participate in your medical care today. Here are my recommendations based on today's visit:    Adjust lactulose dose or frequency to produce a target of 3-4 soft bowel movements a day. Continue Xifaxan twice daily.  You should not drive at all due to the increased risk of traffic accidents.  You received your updated pneumococcal vaccine, PCV-21.  Recommend RSV vaccine if covered by your insurance (check at local pharmacy).    Take care,  Dorene Sorrow, PA

## 2023-09-29 NOTE — Unmapped (Signed)
 [facility]  09/29/2023    Reason for visit: Follow up of Other cirrhosis of liver (CMS-HCC) [K74.69]    Assessment/Plan:    58 year old female presenting for follow up of decompensated MASH cirrhosis.  Her cirrhosis has been complicated by clinically significant portal hypertension, ascites, and hepatic encephalopathy. Ascites has responded well to diuretics, and she has not required a therapeutic paracentesis. Continues to have intermittent HE despite splenorenal shunt embolization and lactulose/Xifaxan. No history of jaundice of esophageal variceal bleeding. Her metabolic risk factors include obesity, type 2 diabetes mellitus, hypertension, and dyslipidemia.     She is undergoing liver transplant evaluation as she may have a living donor. We discussed that she needs a cardiac cath to complete evaluation. Additionally, has upcoming appointments with urogynecology, ID, and allergy.    Other cirrhosis (CMS-HCC)  MELD 16. Completed transplant evaluation and approved for listing.  Increase protein intake, goal 100 g/day. Discussed using protein supplements such as Glucerna.  Avoid NSAIDs.  Tylenol up to 2 g/day is okay.  PCV-21 administered today. Recommend RSV vaccine if covered by insurance.     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  Continue lactulose, titrated to produce 3-5 soft bowel movements per day.   Continue Xifaxan twice daily.  She was advised not to drive at all due to risk of traffic accidents.    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  She will not require EGDs for esophageal variceal surveillance if she remains on carvedilol.    Other ascites (CMS-HCC)  Well-managed on diuretics. Continue Lasix 80 mg daily and spironolactone 50 mg daily.  Limit sodium to 2000 mg daily.    Pruritus  Resolved on naltrexone 50 mg daily.     Lower extremity edema  Continue diuretics as noted above.  Limit sodium to 2000 mg daily.   Discussed use of compression stockings.    MASH  Continue weight loss efforts.  Continue Ozempic.  Continue management of metabolic risk factors with PCP.    Remote history of breast cancer (2014, s/p radiation, normal mammogram 10/2022)  Consider PET CT.    Return in about 3 months (around 12/30/2023).    Subjective   History of Present Illness   Accompanied by: friend Diane    58 y.o. 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 Her cirrhosis has been complicated by clinically significant portal hypertension, ascites, and hepatic encephalopathy. EGD 01/15/23, G1 EV and non bleeding gastric ulcers (H. Pylori negative). 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 Admitted 2/5-08/17/23 with melena and hepatic encephalopathy. EGD and colonoscopy without evidence of bleeding. Capsule endoscopy with multiple angioectasias with stigmata of recent bleeding in the small bowel and a single angioectasia with active bleeding in the small bowel. Anemia has since resolved.    Interval History  Last visit 09/04/2023. Denies interim hospitalizations. Denies any liver-related concerns. Occasionally has upper abdominal discomfort if she eats too much. Otherwise denies abdominal pain or distention. Denies jaundice, fever, lower extremity edema, melena, or hematochezia. Received lactulose powder which has been much easier for her to take and her encephalopathy has improved, denies any recent episodes. Resumed Ozempic. She has upcoming appointments with urogynecology, ID, and allergy. Notes her sister is undergoing evaluation to be considered as  a living donor.    Objective   Physical Exam   Vital Signs: BP 129/69 (BP Site: R Arm, BP Position: Sitting, BP Cuff Size: Large)  - Pulse 83  - Temp 36.7 ??C (98 ??F) (Tympanic)  - Ht 165.1 cm (5' 5)  - Wt 94.2 kg (207 lb 11.2 oz)  - SpO2 97%  - BMI 34.56 kg/m??   Constitutional: She is in no apparent distress  Eyes: Anicteric sclerae  Cardiovascular: Mild to moderate peripheral edema bilaterally  Gastrointestinal: Soft, non-distended abdomen with mild right upper quadrant tenderness, splenomegaly  Neurologic: Awake, alert, and oriented to person, place, and time with normal speech; mild asterixis present    Results for orders placed or performed in visit on 09/29/23   Iron Level and TIBC   Result Value Ref Range    Iron 268 (H) 50 - 170 ug/dL    TIBC 366 440 - 347 ug/dL    Iron Saturation (%) 86 (H) 20 - 55 %   Ferritin   Result Value Ref Range    Ferritin 161.3 7.3 - 270.7 ng/mL   CBC   Result Value Ref Range    WBC 6.4 3.6 - 11.2 10*9/L    RBC 4.42 3.95 - 5.13 10*12/L    HGB 13.6 11.3 - 14.9 g/dL    HCT 42.5 95.6 - 38.7 %    MCV 88.9 77.6 - 95.7 fL    MCH 30.7 25.9 - 32.4 pg    MCHC 34.6 32.0 - 36.0 g/dL    RDW 56.4 (H) 33.2 - 15.2 %    MPV 10.6 6.8 - 10.7 fL    Platelet 44 (L) 150 - 450 10*9/L   PT-INR   Result Value Ref Range    PT 14.1 (H) 9.9 - 12.6 sec    INR 1.24    Comprehensive Metabolic Panel   Result Value Ref Range    Sodium 142 135 - 145 mmol/L    Potassium 3.9 3.5 - 5.1 mmol/L    Chloride 103 98 - 107 mmol/L    CO2 27.0 20.0 - 31.0 mmol/L    Anion Gap 12 5 - 14 mmol/L    BUN 22 9 - 23 mg/dL    Creatinine 9.51 (H) 0.55 - 1.02 mg/dL    BUN/Creatinine Ratio 19     eGFR CKD-EPI (2021) Female 55 (L) >=60 mL/min/1.39m2    Glucose 127 70 - 179 mg/dL    Calcium 88.4 8.7 - 16.6 mg/dL    Albumin 3.1 (L) 3.4 - 5.0 g/dL    Total Protein 7.2 5.7 - 8.2 g/dL    Total Bilirubin 2.6 (H) 0.3 - 1.2 mg/dL    AST 063 (H) <=01 U/L    ALT 75 (H) 10 - 49 U/L    Alkaline Phosphatase 154 (H) 46 - 116 U/L      MELD 3.0: 16 at 09/29/2023  3:54 PM  Calculated from:  Serum Creatinine: 1.16 mg/dL at 12/07/930  3:55 PM  Serum Sodium: 142 mmol/L (Using max of 137 mmol/L) at 09/29/2023  3:54 PM  Total Bilirubin: 2.6 mg/dL at 7/32/2025  4:27 PM  Serum Albumin: 3.1 g/dL at 0/62/3762  8:31 PM  INR(ratio): 1.24 at 09/29/2023  3:54 PM  Age at listing (hypothetical): 57 years  Sex: Female at 09/29/2023  3:54 PM

## 2023-09-30 ENCOUNTER — Ambulatory Visit
Admit: 2023-09-30 | Discharge: 2023-10-01 | Payer: PRIVATE HEALTH INSURANCE | Attending: Family Medicine | Primary: Family Medicine

## 2023-09-30 DIAGNOSIS — N390 Urinary tract infection, site not specified: Principal | ICD-10-CM

## 2023-09-30 DIAGNOSIS — Z7682 Awaiting organ transplant status: Principal | ICD-10-CM

## 2023-09-30 DIAGNOSIS — K7210 Chronic hepatic failure without coma: Principal | ICD-10-CM

## 2023-09-30 MED ORDER — OMEPRAZOLE 20 MG CAPSULE,DELAYED RELEASE
ORAL_CAPSULE | Freq: Two times a day (BID) | ORAL | 1 refills | 90 days | Status: CP
Start: 2023-09-30 — End: 2024-09-29

## 2023-09-30 NOTE — Unmapped (Addendum)
 You empty your bladder normally.  No blood or signs of infection in your urine.  The urethra appears normal and healthy.  No significant prolapse noted on your exam.     Recurrent UTI    Please watch out for sudden changes in bladder related symptoms of UTI, which may include urgency, frequency, pain with urination, or sudden worsening of your normal bladder issues, or signs of more severe infection including fevers, chills, sudden one-sided upper back pain, sudden pain in the lower abdomen, nausea, vomiting.    Symptoms that are not sudden, are chronic (last for weeks at a time), and do not respond completely to antibiotics or take longer than 2 days to respond may not be due to UTI, and may be related to other bladder issues.    We recommend testing the urine for infection only if you have have bladder related symptoms of a UTI or signs of a more severe infection. We do not recommend testing without consistent symptoms of a UTI, because most women normally have bacteria in their urine (the urinary microbiome). These bacteria (even ones that are traditionally considered bad bacteria) may be a normal part of your bladder, and treating this normal bacteria with antibiotics can cause antibiotic resistance and antibiotic side effects, and does not decrease risk of UTIs.     If you think you have an infection, do not be concerned. Please call your PCP or go to an urgent care to have urine testing, including a urine culture sent. Please have them send [place] copies of the results    For prevention of UTIs, we recommend starting:   Drinking 45-60 oz of water daily  Urinating at least every 2-3 hours  You are able to correlate your 2 recent infections to fecal incontinence. Since this has lessened, you have not had a UTI in 2.5 months.     If you develop symptoms of a UTI, have a urine culture done any notify. We can add additional prevention if needed in the future.     Return in 3 months. IF you have not had a UTI in those 3 months, message and you can reschedule the follow up

## 2023-10-02 ENCOUNTER — Ambulatory Visit: Admit: 2023-10-02 | Discharge: 2023-10-03 | Payer: PRIVATE HEALTH INSURANCE

## 2023-10-02 DIAGNOSIS — Z880 Allergy status to penicillin: Principal | ICD-10-CM

## 2023-10-02 NOTE — Unmapped (Signed)
 Call received from patients sister [name] stating she tried to reach the transplant coordinator Corrie, RN but her voicemail stated she was out of the office.     [name] was inquiring about a cardiac procedure that was discussed with the patient during Mondays visit. She was asking on behalf of the patient to find out the status of the procedure.       Chart reviewed, visit note isn't available to review.    Will route to provider to follow up. Unsure which transplant coordinator is covering for [employer], [id number].       Shon Baton, MSN-Ed, RN,   Nursing Coordinator for   Dr. Eudelia Bunch & Betsey Holiday., PA-C  [facility]   [phone]

## 2023-10-02 NOTE — Unmapped (Signed)
 OFFICE-BASED ORAL DRUG CHALLENGE    DRUG Challenged: Amoxicillin 500 mg x 1 dose    Patient assessed by Dr. Craige Cotta. Patient is free from illness and fever. She has no rashes. Last antihistamine use greater than 5 days prior to this challenge. Procedure was explained to patient and questions answered. Informed consent was obtained after reviewing the risks, benefits, and alternatives to AMOXICILLIN challenge. Epipen and Benadryl were readily available within the clinic.    Pre challenge physical exam and vital signs: stable or within normal limits (please see Vital Signs below).     We proceeded with the oral challenge. The patient received 10% dose (50 mg in 1 mL) of amoxicillin and was observed for 30 minutes. She had no adverse reactions. We administered the remaining 90% dose (450 mg in 9 mL) of amoxicillin. Patient was observed for 30 minutes. No adverse reaction developed.     Vitals:  Vitals:    10/02/23 1203 10/02/23 1315   BP: 138/80 128/71   BP Site: L Arm L Arm   BP Position: Sitting Sitting   BP Cuff Size: Medium Medium   Pulse: 94 78   SpO2: 94%        Total amount of amoxicillin consumed: 500 mg in 10 mL.    Time observed following final dose: 30 Minutes     Total duration of challenge: 60 minutes    Outcome: Passed

## 2023-10-06 NOTE — Unmapped (Signed)
 Call placed and spoke with Noel Gerold - discussed need for updated sx appt (4/7) per Dr Edwin Dada in anticipation of possible living donor sx. Patient stated understanding.

## 2023-10-07 NOTE — Unmapped (Signed)
 Clinical Social Worker Telephone Note    Name:Nevena NEAVEH BELANGER  Date of Birth:February 27, 1966  ZOX:[id number]    REFERRAL INFORMATION:  NIKITIA ASBILL is waitlisted for liver transplantation . CSW follows up to assess financial questions/planning.    IMPRESSION:   Follow up call to patient regarding NLDAC portion of recipient application has yet to be received.     Patient states that her sister, Stephenie Einstein sent it off but she doesn't know to who. Patient will call her and let CSW know. 1:56 PM    1:16 PM   10/08/23  Returned call to Kamaile's sister, Stephenie Einstein at [phone] regarding [facility] application. Stephenie Einstein states that she returned the application via MyChart on 10/01/23 to CSW attention.   Informed her that CSW had not received it. Requested Debbie send via email to CSW.     10:36 AM  10/15/23  Call to patient as email never received with recipient [facility] documents.   Re-provided work Agricultural engineer to patient and sent again via [employer].     4:24 PM  Responded to email received with recipient's NLDAC documents to patient and her sibling/Debbie.   Forwarded to covering [employer].        Redmond Candle, LCSW  Transplant Case Manager  10/07/2023. 1:52 PM

## 2023-10-08 ENCOUNTER — Ambulatory Visit: Admit: 2023-10-08 | Discharge: 2023-10-09 | Attending: Geriatric Medicine | Primary: Geriatric Medicine

## 2023-10-08 DIAGNOSIS — Z9489 Other transplanted organ and tissue status: Principal | ICD-10-CM

## 2023-10-08 DIAGNOSIS — Z7682 Awaiting organ transplant status: Principal | ICD-10-CM

## 2023-10-08 DIAGNOSIS — Z01818 Encounter for other preprocedural examination: Principal | ICD-10-CM

## 2023-10-08 NOTE — Unmapped (Signed)
 IMMUNOCOMPROMISED HOST PRE-TRANSPLANT CONSULT NOTE    Denise York is being seen in consultation at request of Dr. Eudelia Bunch for evaluation of pre-transplantation infectious disease risk.    Assessment/Plan:     Denise York is a 58 y.o. female being evaluated for liver transplantation.     The goals of the transplant infectious diseases evaluation were discussed with Denise York, who is in the transplant infectious diseases clinic to assess her infection risks and to render an opinion as to whether or not she is a suitable candidate for transplantation from the infectious diseases standpoint. I reviewed in detail, the risks of infection as they relate to liver transplantation and the use of life-long immunosuppression. I counseled the patient on the importance of vaccinations and provided specific guidance regarding the patient's occupation, activities and hobbies to avoid future infectious complications.    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 (admission 08/2023)  MELD 3.0: 16 at 09/29/2023  3:54 PM  MELD-Na: 14 at 09/29/2023  3:54 PM  Calculated from:  Serum Creatinine: 1.16 mg/dL at 08/07/8655  8:46 PM  Serum Sodium: 142 mmol/L (Using max of 137 mmol/L) at 09/29/2023  3:54 PM  Total Bilirubin: 2.6 mg/dL at 9/62/9528  4:13 PM  Serum Albumin: 3.1 g/dL at 2/44/0102  7:25 PM  INR(ratio): 1.24 at 09/29/2023  3:54 PM  Age at listing (hypothetical): 57 years  Sex: Female at 09/29/2023  3:54 PM    Pertinent comorbidities: HTN, DMII (ozempic), s/p hysterectomy in the 1990s, s/p cholecystectomy, bladder prolapse s/p mesh-repair x 2 surgeries in 1997, ASD s/p septal occluder device     Pertinent exposures: None    Prior infections   #E coli in urine culture without symptoms, 07/13/23  Has had some repeated + Urine cultures but does not have clinical symptoms or course of high risk recurrent UTIs at this time of pre-transplant evaluation. Findings of most recent UCx are more typical of episodic bacteriuria and thus far not a strong pattern of recurrent UTIs  - AST: R to Amp/Amp-sulbactam; I to cefazolin, S to all other abx tested  - obtained during admission for altered mental status that was ascribed to hepatic encephalopathy, also had frequent diarrhea at that time and pt thinks this likely led to UTI  - E coli with the same AST in urine culture November 2024 also during an admission without antibiotic treatment reported in [facility]  - has had pelvic surgery x 2 remotely for prolapse, but no reports of frequent recurrent UTI  Rx: Nitrofurantin 1/7-2/4 (E coli); ceftriaxone 2/5-2/7 (this was for GI bleed, urine culture mixed)    #Left upper extremity cellulitis from peripheral IV, 08/19/23, resolved  Rx: cefalexin 2/11-2/27    #Vulvovaginitis, resolved  Rx: diflucan 08/22/23; miconazole 08/21/23    Antimicrobial allergies/intolerances: None      RECOMMENDATIONS    No absolute contraindications exist to proceeding with transplant from an ID standpoint.    Diagnosis  None indicated at this time. Would monitor for symptoms of UTI and if present, obtain UA and urine culture.     Management  None indicated at this time. Would  monitor for symptoms of UTI and if present, obtain UA and urine culture.     Vaccinations  RSV Vaccine today all other vaccines up-to-date    Peri-transplant antimicrobial recommendations  Per protocol (E coli AST reviewed and S to pip/tazo)    Follow up PRN       Recommendations communicated via shared medical record.     Jacquenette Shone, MD  [place] Division of Infectious Diseases        Subjective:    History of Present Illness: Denise York is a 58 y.o. year old female with advanced liver disease due to MAFLD who is currently being evaluated for transplantation.    External record(s):  Primary team notes, discharge summaries, urogyne eval with details of these extensively reviewed notes in the HPI below and the problem list above .  Denise York is a 58 y.o. female seen in consultation at the request of Dr. Eudelia Bunch for evaluation of recurrent UTI. She reports that she was referred by her liver doctor for UTIs and to be evaluated prior to her transplant. She has a history of a hysterectomy for hemorrhage after delivery. She then had prolapse and had repair using mesh and then had bothersome leakage with a different surgery for her leakage. She denies any prolapse/bulge. She reports fecal incontinence that she relates to her medication for her liver and associates the 2 infections she had to times when she was in the hospital with FI with 1 instance even having her sit in stool for over an hour and a half. She denies bothersome urinary incontinence and reports 1 panty liner per day. She reports since starting ozempic, she has not had any FI and has not had an infection since. 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      Independent historian(s): was required due to patient status (memory problems secondary to liver disease, sister was present to provide details)..       History of pertinent past infections/conditions:  Frequent dental infections: no  Recurrent sinusitis: no  Recurrent bronchitis/pneumonias: no  Chronic diarrhea: yes  Urinary tract infections: yes, (details below in micro section). Not really 'recurrent' but has E coli colonization and hard to rule-out infections during hospitalizations given altered mental status. In Nov 2024 and in Jan 2025 she had E coli UTI. Already evaluated by urogyne without evidence of anatomical issue needing intervention. Has pelvic mesh for incontinence x 2 surgeries, last was in 1997. Urogyne did not see need for intervention at this time.  Kidney stones: no  Gout: no  Skin boils or Staph infection: no  Oral or genital ulcers: no  Thrush or vaginal yeast infections after antibiotics: no, she relates the recent vulvovaginitis to diarrhea >> abx  History of C difficile colitis: no  Methicillin-resistant Staphylococcus aureas colonization/infection: no  Vancomycin-resistant Enterococcus colonization/infection: no  Colonization/infection with a multidrug or antibiotic resistant organism: no    The patient has not been hospitalized for infections at hospitals besides [facility].   Last use of antibiotics was February for LUE cellulitis. She had medication-induced in December (fiber and magnesium related) and stopped taking her lactulose. Had also been on ozempic that was held along with changing her bowel regimen. Admitted for HE in January because she had stopped lactulose. Readmitted again in early Feb for GIB. Started on ceftriaxone and 1 day later Urine culture was mixed flora. She states that with all of  the diarrhea she likely had led to UTI (E coli). She also had LUE cellultis after feb discharge at site of prior PIV that she also thinks could have been related to contamination from diarrhea. Reoslved on cephalexin x 14 days . She and also the vaginal/buttock irritation with candida that resolved with systemic and topical antifungals. All of these issues have resolved.    The patient was diagnosed with MAFLD in the last 18 months and has a history of the following:   Confusion related to liver disease: yes  Stomach or intestinal bleeding: yes  Ascites or pleural fluid infection: no  MELD 3.0: 16 at 09/29/2023  3:54 PM  MELD-Na: 14 at 09/29/2023  3:54 PM  Calculated from:  Serum Creatinine: 1.16 mg/dL at 1/61/0960  4:54 PM  Serum Sodium: 142 mmol/L (Using max of 137 mmol/L) at 09/29/2023  3:54 PM  Total Bilirubin: 2.6 mg/dL at 0/98/1191  4:78 PM  Serum Albumin: 3.1 g/dL at 2/95/6213  0:86 PM  INR(ratio): 1.24 at 09/29/2023  3:54 PM  Age at listing (hypothetical): 57 years  Sex: Female at 09/29/2023  3:54 PM    Today the patient denies:  Fever, Chills, and Night Sweats  R sided abdominal pain, weight fluctuates based on diuretics dosing  cough SOB with long walking and climbing stairs  Rash, Itching, Skin Sores/Ulcers, and Bleeding Moles    Medical History  Past Medical History:   Diagnosis Date    Back pain     CHF (congestive heart failure)     Depressed     Diabetes mellitus     Fatigue     Infectious viral hepatitis     Joint pain     Neoplasm of right breast, 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    Obesity     Prediabetes      The medical history was reviewed    Surgical History  Past Surgical History:   Procedure Laterality Date    ABDOMINAL WALL MESH  REMOVAL  1997, 2005    Placement, 1997, removal and replacement 2005    BLADDER SURGERY      bladder tact X2    BREAST BIOPSY Right 04/2013    benign    BREAST EXCISIONAL BIOPSY Right 05/2013    benign    BREAST SURGERY Right     precancerous spot removed    CHOLECYSTECTOMY      HYSTERECTOMY      IR EMBOLIZATION VENOUS OTHER THAN HEMORRHAGE  02/17/2023    IR EMBOLIZATION VENOUS OTHER THAN HEMORRHAGE 02/17/2023 Ammie Dalton, MD IMG VIR [facility]    PR COLONOSCOPY FLX DX W/COLLJ SPEC WHEN PFRMD N/A 08/15/2023    Procedure: COLONOSCOPY, FLEXIBLE, PROXIMAL TO SPLENIC FLEXURE; DIAGNOSTIC, W/WO COLLECTION SPECIMEN BY BRUSH OR WASH;  Surgeon: Beverly Milch, MD;  Location: [facility] [place];  Service: Gastrointestinal    PR COLSC FLX W/RMVL OF TUMOR POLYP LESION SNARE TQ N/A 10/14/2016    Procedure: COLONOSCOPY FLEX; W/REMOV TUMOR/LES BY SNARE;  Surgeon: Alfred Levins, MD;  Location: [facility] [place];  Service: Gastroenterology    PR COLSC FLX W/RMVL OF TUMOR POLYP LESION SNARE TQ N/A 01/15/2023    Procedure: COLONOSCOPY FLEX; W/REMOV TUMOR/LES BY SNARE;  Surgeon: Mickie Hillier, MD; Location: [facility];  Service: Gastroenterology    PR GI IMAG INTRALUMINAL ESOPHAGUS-ILEUM W/I&R N/A 08/15/2023    Procedure: GI VIDEO TRACT IMAGE INTRALUMINAL (EG, CAPSULE ENDOSCOPY),  ESOPHAGUS VIA ILEUM, PHYSICIAN INTERPRETATION & REPORT;  Surgeon: Beverly Milch, MD;  Location: [facility];  Service: Gastrointestinal    PR PERC CLOS,CONG INTERATRIAL COMMUN W/IMPL N/A 09/12/2022    Procedure: ASD closure;  Surgeon: Jacquelyne Balint, MD;  Location: [facility] CATH;  Service: Cardiology    PR UPPER GI ENDOSCOPY,BIOPSY N/A 01/15/2023    Procedure: UGI ENDOSCOPY; WITH BIOPSY, SINGLE OR MULTIPLE;  Surgeon: Mickie Hillier, MD;  [facility]: [facility];  Service: Gastroenterology    PR UPPER GI ENDOSCOPY,DIAGNOSIS N/A 08/14/2023    Procedure: UGI ENDO, INCLUDE ESOPHAGUS, STOMACH, & DUODENUM &/OR JEJUNUM; DX W/WO COLLECTION SPECIMN, BY BRUSH OR WASH;  Surgeon: Beverly Milch, MD;  [facility]: [facility];  Service: Gastrointestinal    SKIN BIOPSY       The surgical history was reviewed    Medications    Current Outpatient Medications:     atorvastatin (LIPITOR) 20 MG tablet, Take 1 tablet (20 mg total) by mouth daily., Disp: 90 tablet, Rfl: 3    blood sugar diagnostic (GLUCOSE BLOOD) Strp, Use to test blood sugar daily, Disp: 100 strip, Rfl: 3    blood-glucose meter kit, Use to test blood sugar DAILY, Disp: 1 each, Rfl: 1    [Paused] carvedilol (COREG) 6.25 MG tablet, Take 1 tablet (6.25 mg total) by mouth two (2) times a day. Please add to pill pack (Patient not taking: Reported on 10/02/2023), Disp: 60 tablet, Rfl: 11    cholecalciferol, vitamin D3 25 mcg, 1,000 units,, 1,000 unit (25 mcg) tablet, Take 2 tablets (50 mcg total) by mouth daily., Disp: , Rfl:     DAILY-VITE, WITH FOLIC ACID, 400 mcg Tab tablet, Take 1 tablet by mouth daily., Disp: , Rfl:     escitalopram oxalate (LEXAPRO) 10 MG tablet, Take 1 tablet (10 mg total) by mouth daily., Disp: 90 tablet, Rfl: 3    furosemide (LASIX) 80 MG tablet, Take 1 tablet (80 mg total) by mouth daily. Please add to pill packs, Disp: 90 tablet, Rfl: 0    inhalational spacing device (AEROCHAMBER MV) Spcr, Use as directed with inhalers, Disp: 1 each, Rfl: 0    lactulose (CEPHULAC) 20 gram packet, Take 1 packet (20 g total) by mouth Three (3) times a day., Disp: 270 packet, Rfl: 3    lancets Misc, Use to test blood sugar daily, Disp: 100 each, Rfl: 3    loratadine (CLARITIN) 10 mg tablet, Take 1 tablet (10 mg total) by mouth daily., Disp: 90 tablet, Rfl: 1    magnesium oxide (MAG-OX) 400 mg (241.3 mg elemental magnesium) tablet, Take 1 tablet (400 mg total) by mouth daily. (Patient not taking: Reported on 09/11/2023), Disp: 90 tablet, Rfl: 3    metFORMIN (GLUCOPHAGE-XR) 500 MG 24 hr tablet, Take 1 tablet (500 mg total) by mouth daily with evening meal., Disp: 90 tablet, Rfl: 3    multivitamin (TAB-A-VITE/THERAGRAN) per tablet, Take 1 tablet by mouth daily., Disp: 90 tablet, Rfl: 3    naltrexone (DEPADE) 50 mg tablet, Take 1 tablet (50 mg total) by mouth daily. Please add to pill packs, Disp: 30 tablet, Rfl: 11    omeprazole (PRILOSEC) 20 MG capsule, TAKE 1 CAPSULE (20 MG TOTAL) BY MOUTH TWO (2) TIMES A DAY (30 MINUTES BEFORE A MEAL)., Disp: 180 capsule, Rfl: 1    ondansetron (ZOFRAN-ODT) 4 MG disintegrating tablet, Dissolve 1 tablet (4 mg total) in the mouth every eight (8) hours as needed for nausea., Disp: 120 tablet, Rfl:  3    psyllium seed, with sugar, (FIBER ORAL), Take 4 capsules by mouth daily. BJs brand. (Patient not taking: Reported on 09/11/2023), Disp: , Rfl:     rifAXIMin (XIFAXAN) 550 mg Tab, Take 1 tablet (550 mg total) by mouth two (2) times a day., Disp: 180 tablet, Rfl: 3    semaglutide (OZEMPIC) 1 mg/dose (4 mg/3 mL) PnIj injection, Inject 1 mg under the skin every seven (7) days., Disp: 9 mL, Rfl: 3    spironolactone (ALDACTONE) 50 MG tablet, Take 1 tablet (50 mg total) by mouth daily., Disp: 90 tablet, Rfl: 3    Allergies:   Allergies Allergen Reactions    Shellfish Containing Products Anaphylaxis    Glipizide Headache    Lisinopril Cough       Social History:  Tobacco use:  reports that she has never smoked. She has been exposed to tobacco smoke. She has never used smokeless tobacco.  Alcohol use:  reports no history of alcohol use.  Drug use:  reports no history of drug use.  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) Noticed decline in cognitive abilities for several months that led to diagnosis of liver disease  Pets and animal exposure: Animal exposures include small dog  Hobbies: Denies unusual environmental exposures  TB exposures: No family history of TB  Sexual history: No history of STIs  Other significant exposures: No exposure to unpasteurized dairy products, No exposure to raw/undercooked foods, Never incarcerated, Never homeless, and well water     Family History:  Family History   Problem Relation Age of Onset    Heart disease Mother     Hypertension Mother     COPD Mother     Heart disease Father     Kidney disease Father     Allergies Father     Gout Father     Diabetes Father     Allergies Other         FAMILY H/O    Coronary artery disease Other         FAMILY H/O    Kidney failure Other         FAMILY H/O    No Known Problems Sister     No Known Problems Daughter     No Known Problems Maternal Grandmother     No Known Problems Maternal Grandfather     No Known Problems Paternal Grandmother     No Known Problems Paternal Grandfather     Alcohol abuse Maternal Uncle     Birth defects Son     Birth defects Son     BRCA 1/2 Neg Hx     Breast cancer Neg Hx     Cancer Neg Hx     Colon cancer Neg Hx     Endometrial cancer Neg Hx     Ovarian cancer Neg Hx     Substance Abuse Disorder Neg Hx     Mental illness Neg Hx             Objective:      Physical Examination:  BP 124/65 (BP Site: L Arm, BP Position: Sitting,  BP Cuff Size: Large)  - Pulse 90  - Temp 36.7 ??C (98 ??F) (Tympanic)  - Ht 165.1 cm (5' 5)  - Wt 93.5 kg (206 lb 3.2 oz)  - SpO2 100%  - BMI 34.31 kg/m??     Const [x]  vital signs above    []  NAD, non-toxic appearance []  Chronically ill-appearing, non-distressed        Eyes [x]  Lids normal bilaterally, conjunctiva anicteric and noninjected OU     [x] PERRL  [] EOMI        ENMT []  Normal appearance of external nose and ears, no nasal discharge        [x]  MMM, no lesions on lips or gums []  No thrush, leukoplakia, oral lesions  []  Dentition good []  Edentulous []  Dental caries present  []  Hearing normal  []  TMs with good light reflexes bilaterally         Neck []  Neck of normal appearance and trachea midline        [x]  No thyromegaly, nodules, or tenderness   []  Full neck ROM        Lymph [x]  No LAD in neck     []  No LAD in supraclavicular area     []  No LAD in axillae   []  No LAD in epitrochlear chains     []  No LAD in inguinal areas        CV []  RRR            []  No peripheral edema     []  Pedal pulses intact   [x]  No abnormal heart sounds appreciated   []  Extremities WWP         Resp [x]  Normal WOB at rest    [x]  No breathlessness with speaking, no coughing  []  CTA anteriorly    []  CTA posteriorly          GI [x]  Normal inspection, NTND   []  NABS     []  No umbilical hernia on exam       []  No hepatosplenomegaly     []  Inspection of perineal and perianal areas normal        GU []  Normal external genitalia     [] No urinary catheter present in urethra   []  No CVA tenderness    []  No tenderness over renal allograft  deferred      MSK [x]  No clubbing or cyanosis of hands       []  No vertebral point tenderness  []  No focal tenderness or abnormalities on palpation of joints in RUE, LUE, RLE, or LLE        Skin [x]  No rashes, lesions, or ulcers of visualized skin     [x]  Skin warm and dry to palpation         Neuro [x]  Face expression symmetric  [x]  Sensation to light touch grossly intact throughout    []  Moves extremities equally    [x]  No tremor noted        [x]  CNs II-XII grossly intact     []  DTRs normal and symmetric throughout []  Gait unremarkable        Psych [x]  Appropriate affect       []  Fluent speech         [x]  Attentive, good eye contact  []  Oriented to person, place, time          []  Judgment and insight are appropriate           Data for Medical Decision  Making     I discussed mgm't w/qualified health care professional(s) involved in case: coordinator .    I reviewed CBC results (stable), chemistry results (stable, meld most recent calculation reviewed at 16), and micro result(s) (Urine culture results detailed in micro section below).    I independently visualized/interpreted not done.       Labs:  Lab Results   Component Value Date    WBC 6.4 09/29/2023    WBC 7.5 06/15/2014    HGB 13.6 09/29/2023    Hemoglobin, POC 12.7 09/12/2022    HCT 39.3 09/29/2023    HCT 43.2 06/15/2014    Platelet 44 (L) 09/29/2023    Platelet 198 06/15/2014    Absolute Neutrophils 1.9 08/13/2023    Absolute Neutrophils 4.6 06/15/2014    Absolute Lymphocytes 0.7 (L) 08/13/2023    Absolute Lymphocytes 2.2 06/15/2014    Absolute Eosinophils 0.1 08/13/2023    Absolute Eosinophils 0.1 06/15/2014    Sodium 142 09/29/2023    Sodium 137 06/11/2023    Potassium 3.9 09/29/2023    Potassium 4.1 06/11/2023    BUN 22 09/29/2023    BUN 16 06/11/2023    Creatinine Whole Blood, POC 1.1 09/04/2023    Creatinine 1.16 (H) 09/29/2023    Glucose 127 09/29/2023    Magnesium 2.2 09/04/2023    Albumin 3.1 (L) 09/29/2023    Total Bilirubin 2.6 (H) 09/29/2023    Total Bilirubin 0.6 06/26/2015    AST 106 (H) 09/29/2023    AST 22 06/26/2015    ALT 75 (H) 09/29/2023    ALT 24 06/26/2015    Alkaline Phosphatase 154 (H) 09/29/2023    Alkaline Phosphatase 65 06/26/2015    INR 1.24 09/29/2023    Sed Rate 13 05/09/2022    CRP <4.0 05/09/2022    Hemoglobin A1c 6.5 (H) 06/15/2014    Hemoglobin A1C 5.7 (H) 09/04/2023    Hemoglobin A1C 5.6 05/08/2022       Microbiology:     Susceptibility Tests        Amoxicillin + Clavulanate Ampicillin Ampicillin + Sulbactam Cefazolin Ceftazidime Ceftriaxone Cephalexin Ciprofloxacin Gentamicin Levofloxacin Nitrofurantoin Piperacillin + Tazobactam Tetracycline Tobramycin Trimethoprim + Sulfamethoxazole   Collected Procedure Specimen Type Organism                  07/13/23 Urine Culture Urine Escherichia coli I R R I S S S S S S S S S S S   05/31/23 Urine Culture Urine Escherichia coli  R R I S S S S S S S S S S S   06/23/18 Urine culture Urine Escherichia coli  I S S   S S S S S   S S      Mixed Urogenital Flora                  04/22/17 Urine culture Urine Escherichia coli  S  S    S S S S   S S         Imaging:  I reviewed updated relevant radiological studies.    Serologies:  Lab Results   Component Value Date    CMV IGG Negative 07/14/2023    EBV VCA IgG Antibody Positive (A) 09/04/2023    HIV Antigen/Antibody Combo Nonreactive 07/14/2023    Hep A IgG Nonreactive 07/14/2023    Hep B Surface Ag Nonreactive 07/14/2023    Hep B S Ab Reactive (A) 07/14/2023    Hep B Surf Ab Quant 87.27 (  H) 07/14/2023    Hep B Core Total Ab Nonreactive 07/14/2023    Hepatitis C Ab Nonreactive 07/14/2023    RPR Nonreactive 09/04/2023    HSV 1 IgG Positive (A) 09/04/2023    HSV 2 IgG Negative 09/04/2023    Varicella IgG Positive 09/04/2023    VZV IgG s/co 40.1 09/04/2023    Rubella IgG Scr Positive 09/04/2023    Toxoplasma Gondii IgG Negative 09/04/2023    Quantiferon TB Gold Plus Interpretation Negative 09/04/2023    Quantiferon Mitogen Minus Nil 6.09 09/04/2023    Quantiferon Antigen 1 minus Nil 0.00 09/04/2023       Immunizations:  Immunization History   Administered Date(s) Administered    COVID-19 VAC,MRNA,TRIS(12Y UP)(PFIZER)(GRAY CAP) 08/19/2020    COVID-19 VACC,MRNA,(PFIZER)(PF) 10/02/2019, 10/23/2019    Covid-19 Vac, (67yr+) (Comirnaty) Mrna Pfizer  04/08/2022, 07/14/2023    HEPATITIS B VACCINE ADULT, ADJUVANTED, IM(HEPLISAV B) 06/17/2022, 09/26/2022    Hepatitis A (Adult) 06/17/2022, 09/26/2022    INFLUENZA INJ MDCK PF, QUAD,(FLUCELVAX)(43MO AND UP EGG FREE) 05/15/2021    INFLUENZA TIV (TRI) PF (IM)(HISTORICAL) 04/09/2011    INFLUENZA VACCINE IIV3(IM)(PF)6 MOS UP 06/12/2023    Influenza TRI (IIV3) 5+yrs MDV 06/04/2013    Influenza Vaccine Quad(IM)6 MO-Adult(PF) 05/08/2017, 04/09/2018, 03/11/2019    Influenza Virus Vaccine, unspecified formulation 04/08/2015, 04/07/2016, 05/04/2020, 04/08/2022    PCV21 (Capvaxive) (Pneumococcal 21-valent Conjugate Vaccine) 09/29/2023    PNEUMOCOCCAL POLYSACCHARIDE 23-VALENT 07/19/2014    Pneumococcal Conjugate 20-valent 06/17/2022    SHINGRIX-ZOSTER VACCINE (HZV),RECOMBINANT,ADJUVANTED(IM) 11/09/2020, 05/15/2021    TdaP 07/16/2011, 06/17/2022     Hepatitis A: immune  Hepatitis B: immune  Rubella: immune  Influenza: up to date  Pneumococcus: up to date  Tetanus/Pertussis: up to date  Shingrix: completed  SARS CoV-2: up to date  RSV: needs vaccine

## 2023-10-08 NOTE — Unmapped (Signed)
Per provider, the patient received RSV VACCINE, BIVALENT (PF) (ABRYSVO)   vaccine.  Patient ID verified with name and date of birth.  All screening questions were answered.  Vaccine(s) were administered as ordered.  See immunization history for documentation.  Patient tolerated the injection(s) well with no issues noted.  Vaccine Information sheet given to the patient.

## 2023-10-09 NOTE — Unmapped (Signed)
 Call place to patient to inform her that per the transplant team, she will NOT require a left heart cath as part of her transplant evaluation.  Left VM, reminded them of appt this coming Monday, and asked her to call back if she needs anything in between now and then.    Also asked for PAP smear results-can send to [place] or bring with them Monday.

## 2023-10-13 ENCOUNTER — Ambulatory Visit
Admit: 2023-10-13 | Discharge: 2023-10-14 | Payer: PRIVATE HEALTH INSURANCE | Attending: Student in an Organized Health Care Education/Training Program | Primary: Student in an Organized Health Care Education/Training Program

## 2023-10-13 ENCOUNTER — Ambulatory Visit: Admit: 2023-10-13 | Discharge: 2023-10-14 | Payer: PRIVATE HEALTH INSURANCE

## 2023-10-13 LAB — D-DIMER, QUANTITATIVE: D-DIMER QUANTITATIVE (ACL TOP): 623 ng{FEU}/mL — ABNORMAL HIGH (ref <=500)

## 2023-10-13 LAB — FIBRINOGEN: FIBRINOGEN LEVEL: 256 mg/dL (ref 175–500)

## 2023-10-13 NOTE — Unmapped (Signed)
 Transplant Surgery History and Physical        Assessment/Recommendations:     Denise York is a 58 y.o. female seen in consultation at the request of Fix, Durene Gill, MD for evaluation of candidacy for liver transplantation.  Seen today for evaluation for  possible LDLT.     I spent 30 minutes with the patient obtaining the above history and physical examination, and greater than 50% of the time was spent counseling and on the substance of the discussion.     Today we discussed liver transplantation going over the surgery to be performed, the hospital course including length of stay, anti-rejection medications and their side effects, results and the cadaveric donor system.     I discussed in detail with Denise York the risks and benefits of liver transplantation, including but not limited to: the general anesthetic, monitoring lines, the incision, the hepatectomy, as well as reimplantation of the liver graft and immunosuppressant medications. In regards to the surgical procedure, we noted that it is a major operation performed under general anesthesia with the risks of heart attack, stroke and death. Multiple invasive means of monitoring may be necessary during the operation including an arterial line, a central venous catheter, a foley catheter inserted into the bladder, and a tube from your nose into your stomach to prevent stomach distension. After surgery, the patient will go to the Intensive Care Unit and is then sent to the regular floor when medically stable. I discussed the possible complications including the need for reoperation for bleeding, infection or other complications, the possibility of clotting/leakage of blood vessels, requiring either radiological intervention, surgical intervention, or even retransplantation. I reviewed the possibility of complications involving the biliary tract including leaks, strictures and need for retransplantation for biliary complications. I discussed the possibility of primary nonfunction of the liver graft requiring urgent retransplantation or the result of death. The patient understands the need for long-term immunosuppression therapy as well as monitoring of labs and immunosuppression. Anti-rejection medications, including Prograf or Cyclosporine (Neoral), Cellcept, steroids and others, will be needed after transplantation and for the patient???s entire lifetime. Problems include infection, cancer, hirsutism, tremors, gum swelling, hypertension, bone fractures, aggravation of diabetes or new onset diabetes, cataracts, and rashes. Finally, the donor system was reviewed. All donors are tested for infections and other diseases, but there is a small chance of transmission of diseases including viruses as well as the possible transmission of tumors. Some patients may elect to receive a liver from a donor who was exposed to the Hepatitis B or Hepatitis C virus and the recipient may require certain anti-viral medications to prevent this virus from damaging the new liver.     Finally, I reviewed with the patient how the surgery is expected to improve their health and quality of life, that the average length of hospitalization stay is 10-12 days, and that the length of their expected recovery period, including when normal daily activities may be resumed, will be patient dependent.     It was additionally discussed that patient has been losing weight and was encouraged to continue to do so today, as this will decrease her risk for both perioperative and post-transplant complications. It was discussed that it was important for her to continue to lose weight while making sure she has plenty of protein intake so that she does not become malnourished prior to operation.      Denise York had all their questions answered and wishes to proceed with the  liver transplant evaluation process.     This patient was seen and evaluated  with no contraindication for transplant from a surgical perspective   Suitable for LDLT  Hyercoagulable work up sent today  Recommendations:   -Suggest continuing  Ozempic till the week prior to LDLT  Prior Lap chole.   Prior Transcatheter ASD closure in 2024. Echo  within normal range  Patent PV with prior VIR closure of umbilical vein.     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 She has never required a paracentesis or had any bleeding esophageal varices. Relevant comorbidities include obesity, T2DM, HTN. HLD. Previous abdominal surgical hx of laparoscopic cholecystectomy. 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 Additionally, she has a hx of previous atrial septal defect repair, last ECHO in May 2024 showed ASD appropriately closed and otherwise WNL.      Patient seen and examined in clinic today w. She reports that she first started having issues related to her liver approximately 1 year ago. She has been hospitalized twice in the past 3 months related to hepatic encephelopathy. Otherwise has some baseline mild confusion and weakness that started approximately 1 year ago. Eats well and has regular bowel movements. Prior to 1 year ago she was able to care for herself and perform her ADLs with ease, however since the progression of her liver disease she is mostly reliant on family members for assistance with ADLs.         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    Obesity      Prediabetes                 Past Surgical History     Past Surgical History         Past Surgical History:   Procedure Laterality Date    ABDOMINAL WALL MESH  REMOVAL   1997, 2005     Placement, 1997, removal and replacement 2005    BLADDER SURGERY  bladder tact X2    BREAST BIOPSY Right 04/2013     benign    BREAST EXCISIONAL BIOPSY Right 05/2013     benign    BREAST SURGERY Right       precancerous spot removed    CHOLECYSTECTOMY        HYSTERECTOMY        IR EMBOLIZATION VENOUS OTHER THAN HEMORRHAGE   02/17/2023     IR EMBOLIZATION VENOUS OTHER THAN HEMORRHAGE 02/17/2023 Clifm Dame, MD IMG VIR [facility]    PR COLSC FLX W/RMVL OF TUMOR POLYP LESION SNARE TQ N/A 10/14/2016     Procedure: COLONOSCOPY FLEX; W/REMOV TUMOR/LES BY SNARE;  Surgeon: Roylene Corn, MD;  Location: [facility];  Service: Gastroenterology    PR COLSC FLX W/RMVL OF TUMOR POLYP LESION SNARE TQ N/A 01/15/2023     Procedure: COLONOSCOPY FLEX; W/REMOV TUMOR/LES BY SNARE;  Surgeon: Gorge Laud, MD;  Location: [facility];  Service: Gastroenterology    PR PERC CLOS,CONG INTERATRIAL COMMUN W/IMPL N/A 09/12/2022     Procedure: ASD closure;  Surgeon: Jacquie Maudlin, MD;  Location: [facility] CATH;  Service: Cardiology    PR UPPER GI ENDOSCOPY,BIOPSY N/A 01/15/2023     Procedure: UGI ENDOSCOPY; WITH BIOPSY, SINGLE OR MULTIPLE;  Surgeon: Gorge Laud, MD;  Location: [facility];  Service: Gastroenterology    SKIN BIOPSY                   Family History     The patient's family history includes Alcohol abuse in her maternal uncle; Allergies in her father and another family member; Birth defects in her son and son; COPD in her mother; Coronary artery disease in an other family member; Diabetes in her father; Gout in her father; Heart disease in her father and mother; Hypertension in her mother; Kidney disease in her father; Kidney failure in an other family member; No Known Problems in her daughter, maternal grandfather, maternal grandmother, paternal grandfather, paternal grandmother, and sister..        Social History:     Tobacco use: never a smoker  Alcohol use: denies  Drug use: denies        Review of Systems     A 12 system review of systems was negative except as noted in HPI    PE: Blood pressure 131/73, pulse 94,  height 163.8 cm (5' 5), weight 93.8 kg (206 lb ), SpO2 97%,   . Body mass index is 34.40 kg/m??.  General: Alert, oriented, no acute distress. Obese.   Lungs: clear to auscultation, percussion to the bases, and unlabored breathing  Heart: euvolemic, regular rate and rhythm, normal S1 and S2, no murmur  Abd: soft, non-distended, non-tender, no organomegaly or masses; Scar of lap chole and periumbilical incision scar  Ascites: mild   Skin: no rashes, jaundice or skin lesions noted  Ext: no edema, well perfused  Neuro: non-focal exam. thought organized, appropriate affect, normal fluent speech    Imaging: Reviewed

## 2023-10-15 NOTE — Unmapped (Addendum)
 Liver Transplant Evaluation Checklist    Transplant coordinator: Rexie Catena     Referring provider: Gareld June Bowen     MELD 3.0: 16 at 09/29/2023  3:54 PM  Calculated from:  Serum Creatinine: 1.16 mg/dL at 2/95/6213  0:86 PM  Serum Sodium: 142 mmol/L (Using max of 137 mmol/L) at 09/29/2023  3:54 PM  Total Bilirubin: 2.6 mg/dL at 5/78/4696  2:95 PM  Serum Albumin: 3.1 g/dL at 2/84/1324  4:01 PM  INR(ratio): 1.24 at 09/29/2023  3:54 PM  Age at listing (hypothetical): 57 years  Sex: Female at 09/29/2023  3:54 PM    A POS    58 y.o. female with decompensated cirrhosis due to MASH complicated by hepatic encephalopathy, ascites, nonclinically significant HPS.    Consultants  Transplant Surgery: Si Draft, MD  Social Work: Redmond Candle, [place]  Psychology: Juline Ohara, PsyD, HSPP  Nutrition: Tarri Farm, RD/LDN  Financial: Armand Lamy    Detailed evaluation below, remarkable for the following:  Nonclinically significant HPS on echocardiogram with bubble study  Saw urogyn 09/30/2023, ID 10/08/2023 for recurrent UTIs, cleared  Passed oral amoxacillin challenge 10/02/2023  Got RSV vaccine 10/08/2023 (up to date on all vaccinations)        Detailed evaluation results:    Results for orders placed or performed during the hospital encounter of 08/13/23   CBC w/ Differential    Collection Time: 08/13/23 11:36 AM   Result Value Ref Range    WBC 3.0 (L) 3.6 - 11.2 10*9/L    RBC 3.26 (L) 3.95 - 5.13 10*12/L    HGB 9.1 (L) 11.3 - 14.9 g/dL    HCT 02.7 (L) 25.3 - 44.0 %    MCV 84.3 77.6 - 95.7 fL    MCH 28.0 25.9 - 32.4 pg    MCHC 33.2 32.0 - 36.0 g/dL    RDW 66.4 (H) 40.3 - 15.2 %    MPV 11.3 (H) 6.8 - 10.7 fL    Platelet 37 (L) 150 - 450 10*9/L    nRBC 0 <=4 /100 WBCs    Neutrophils % 65.4 %    Lymphocytes % 22.6 %    Monocytes % 9.2 %    Eosinophils % 2.2 %    Basophils % 0.6 %    Absolute Neutrophils 1.9 1.8 - 7.8 10*9/L    Absolute Lymphocytes 0.7 (L) 1.1 - 3.6 10*9/L    Absolute Monocytes 0.3 0.3 - 0.8 10*9/L Absolute Eosinophils 0.1 0.0 - 0.5 10*9/L    Absolute Basophils 0.0 0.0 - 0.1 10*9/L     Results for orders placed or performed in visit on 09/29/23   Comprehensive Metabolic Panel    Collection Time: 09/29/23  3:54 PM   Result Value Ref Range    Sodium 142 135 - 145 mmol/L    Potassium 3.9 3.5 - 5.1 mmol/L    Chloride 103 98 - 107 mmol/L    CO2 27.0 20.0 - 31.0 mmol/L    Anion Gap 12 5 - 14 mmol/L    BUN 22 9 - 23 mg/dL    Creatinine 4.74 (H) 0.55 - 1.02 mg/dL    BUN/Creatinine Ratio 19     eGFR CKD-EPI (2021) Female 55 (L) >=60 mL/min/1.44m2    Glucose 127 70 - 179 mg/dL    Calcium 25.9 8.7 - 56.3 mg/dL    Albumin 3.1 (L) 3.4 - 5.0 g/dL    Total Protein 7.2 5.7 - 8.2 g/dL    Total Bilirubin 2.6 (H)  0.3 - 1.2 mg/dL    AST 161 (H) <=09 U/L    ALT 75 (H) 10 - 49 U/L    Alkaline Phosphatase 154 (H) 46 - 116 U/L     Lab Results   Component Value Date    Blood Type A POS 08/13/2023    ABO Grouping A POS 09/04/2023    Magnesium 2.2 09/04/2023    Phosphorus 2.4 09/04/2023    PT 14.1 (H) 09/29/2023    INR 1.24 09/29/2023    APTT 29.9 08/13/2023    Iron 268 (H) 09/29/2023    TIBC 312 09/29/2023    Iron Saturation (%) 86 (H) 09/29/2023    Ferritin 161.3 09/29/2023    AFP-Tumor Marker 5 07/14/2023    Hemoglobin A1c 6.5 (H) 06/15/2014    Hemoglobin A1C 5.7 (H) 09/04/2023    Hemoglobin A1C 5.6 05/08/2022    PEth Interpretation Negative. 07/14/2023    Nicotine, Urine <5.0 07/14/2023    Cotinine, Urine <5.0 07/14/2023     Results for orders placed or performed in visit on 07/14/23   Toxicology Screen, Urine    Collection Time: 07/14/23  1:51 PM   Result Value Ref Range    Amphetamines Screen, Ur Negative <500 ng/mL    Barbiturates Screen, Ur Negative <200 ng/mL    Benzodiazepines Screen, Urine Negative <200 ng/mL    Cannabinoids Screen, Ur Negative <20 ng/mL    Methadone Screen, Urine Negative <300 ng/mL    Cocaine(Metab.)Screen, Urine Negative <150 ng/mL    Opiates Screen, Ur Negative <300 ng/mL    Fentanyl Screen, Ur Negative <1.0 ng/mL    Oxycodone Screen, Ur Negative <100 ng/mL    Buprenorphine, Urine Negative <5 ng/mL     Lab Results   Component Value Date    HIV Antigen/Antibody Combo Nonreactive 07/14/2023    HSV 1 IgG Positive (A) 09/04/2023    HSV 2 IgG Negative 09/04/2023    CMV IGG Negative 07/14/2023    Hep A IgG Nonreactive 07/14/2023    Hep B Surface Ag Nonreactive 07/14/2023    Hep B S Ab Reactive (A) 07/14/2023    Hep B Core Total Ab Nonreactive 07/14/2023    Hepatitis C Ab Nonreactive 07/14/2023    EBV VCA IgG Antibody Positive (A) 09/04/2023    Varicella IgG Positive 09/04/2023    VZV IgG s/co 40.1 09/04/2023    Toxoplasma Gondii IgG Negative 09/04/2023    Rubella IgG Scr Positive 09/04/2023    RPR Nonreactive 09/04/2023    Quantiferon TB Gold Plus Interpretation Negative 09/04/2023       CXR  Results for orders placed during the hospital encounter of 09/03/23    XR Chest 2 views    Impression  No evidence of pneumonia or pulmonary edema.      EKG  Results for orders placed in visit on 09/04/23    ECG 12 Lead    Narrative  SINUS BRADYCARDIA  PROLONGED QT  ABNORMAL ECG  WHEN COMPARED WITH ECG OF 12-Jul-2023 16:45,  NO SIGNIFICANT CHANGE WAS FOUND      Echocardiogram  Echocardiogram W Colorflow Spectral Doppler 09/03/2023    Summary  1. The left ventricle is normal in size with normal wall thickness.  2. The left ventricular systolic function is normal, LVEF is visually  estimated at > 55%.  3. The right ventricle is mildly dilated in size, with normal systolic  function.  4. Agitated saline study is consistent with intrapulmonary shunt (late  bubble crossing, moderate/grade II,  30-100 bubbles in LV).      MRI  Results for orders placed during the hospital encounter of 06/23/23    MRI Abdomen W Wo Contrast    Impression  1.  Cirrhotic liver morphology with sequelae of portal hypertension including mild splenomegaly and small caliber upper abdominal/periesophageal varices.  2.  No new focal hepatic lesion suspicious for HCC is identified.  3.  Other chronic or incidental findings, as described within the body of the report.      CTA Cardiac  Results for orders placed during the hospital encounter of 09/04/23    CTA Cardiac    Impression  Non flow-limiting coronary disease as described below  Mildly dilated right ventricle  CAD-RADS 2 (25-49% stenosis)  , P1 (Mild plaque burden)      Mammogram 10/28/2022:  BI-RADS CATEGORY  1: Negative.       Pap smear: S/p total hysterectomy, not required.       Colonoscopy 01/15/2023:  Impression: - One 4 mm polyp in the transverse colon, removed with   a cold snare. Resected and retrieved.  - Internal hemorrhoids.      Immunization History   Administered Date(s) Administered    COVID-19 VAC,MRNA,TRIS(12Y UP)(PFIZER)(GRAY CAP) 08/19/2020    COVID-19 VACC,MRNA,(PFIZER)(PF) 10/02/2019, 10/23/2019    Covid-19 Vac, (101yr+) (Comirnaty) Mrna Pfizer  04/08/2022, 07/14/2023    HEPATITIS B VACCINE ADULT, ADJUVANTED, IM(HEPLISAV B) 06/17/2022, 09/26/2022    Hepatitis A (Adult) 06/17/2022, 09/26/2022    INFLUENZA INJ MDCK PF, QUAD,(FLUCELVAX)(48MO AND UP EGG FREE) 05/15/2021    INFLUENZA TIV (TRI) PF (IM)(HISTORICAL) 04/09/2011    INFLUENZA VACCINE IIV3(IM)(PF)6 MOS UP 06/12/2023    Influenza TRI (IIV3) 5+yrs MDV 06/04/2013    Influenza Vaccine Quad(IM)6 MO-Adult(PF) 05/08/2017, 04/09/2018, 03/11/2019    Influenza Virus Vaccine, unspecified formulation 04/08/2015, 04/07/2016, 05/04/2020, 04/08/2022    PCV21 (Capvaxive) (Pneumococcal 21-valent Conjugate Vaccine) 09/29/2023    PNEUMOCOCCAL POLYSACCHARIDE 23-VALENT 07/19/2014    Pneumococcal Conjugate 20-valent 06/17/2022    RSV VACCINE, BIVALENT(PF)(ABRYSVO) 10/08/2023    SHINGRIX-ZOSTER VACCINE (HZV),RECOMBINANT,ADJUVANTED(IM) 11/09/2020, 05/15/2021    TdaP 07/16/2011, 06/17/2022

## 2023-10-16 LAB — PROTEIN S PANEL: PROTEIN S ACTIVITY: 58 %{normal} — ABNORMAL LOW (ref 64–147)

## 2023-10-16 LAB — PROTEIN C ACTIVITY: PROTEIN C ACTIVITY: 71 %{normal} (ref 68–170)

## 2023-10-24 DIAGNOSIS — Z7682 Awaiting organ transplant status: Principal | ICD-10-CM

## 2023-10-24 DIAGNOSIS — K7210 Chronic hepatic failure without coma: Principal | ICD-10-CM

## 2023-10-24 MED ORDER — PEG 3350-ELECTROLYTES 236 GRAM-22.74 GRAM-6.74 GRAM-5.86 GRAM SOLUTION
Freq: Once | ORAL | 0 refills | 1.00 days | Status: CP
Start: 2023-10-24 — End: 2023-10-24

## 2023-10-24 NOTE — Unmapped (Signed)
 Confirmed LDLT surgery date with recipient.    Bed reservation order entered.  Bowel prep sent to local pharmacy, gave patient specific instructions on when to take it.  Confirmed date for pre op on 4/28 with recipient and her caregiver.  Time TBD.

## 2023-10-28 NOTE — Unmapped (Signed)
 Telephone call to patient sister to schedule PPS appointment.    Patient in agreement with:    Date: 11/03/23  Time: 2:00pm  Location: [address] [place], [place] [address]

## 2023-10-29 ENCOUNTER — Ambulatory Visit
Admission: RE | Admit: 2023-10-29 | Discharge: 2023-10-29 | Disposition: A | Discharge status: Home / Self Care | Payer: Self-pay | Source: Ambulatory Visit | Attending: Family Medicine | Admitting: Family Medicine

## 2023-10-29 DIAGNOSIS — Z1231 Encounter for screening mammogram for malignant neoplasm of breast: Secondary | ICD-10-CM | POA: Diagnosis present

## 2023-10-31 NOTE — Unmapped (Signed)
 Please see patient surgery visit for urine collection documentation.

## 2023-11-03 ENCOUNTER — Ambulatory Visit: Admit: 2023-11-03 | Discharge: 2023-11-03 | Payer: PRIVATE HEALTH INSURANCE

## 2023-11-03 ENCOUNTER — Encounter: Admit: 2023-11-03 | Discharge: 2023-11-03 | Payer: PRIVATE HEALTH INSURANCE

## 2023-11-03 DIAGNOSIS — Z7682 Awaiting organ transplant status: Principal | ICD-10-CM

## 2023-11-03 DIAGNOSIS — Z01818 Encounter for other preprocedural examination: Principal | ICD-10-CM

## 2023-11-03 DIAGNOSIS — K760 Fatty (change of) liver, not elsewhere classified: Principal | ICD-10-CM

## 2023-11-03 DIAGNOSIS — K7210 Chronic hepatic failure without coma: Principal | ICD-10-CM

## 2023-11-03 DIAGNOSIS — K7682 Hepatic encephalopathy: Principal | ICD-10-CM

## 2023-11-03 DIAGNOSIS — Q211 ASD (atrial septal defect): Principal | ICD-10-CM

## 2023-11-03 DIAGNOSIS — F331 Major depressive disorder, recurrent, moderate: Principal | ICD-10-CM

## 2023-11-03 DIAGNOSIS — K7469 Other cirrhosis of liver: Principal | ICD-10-CM

## 2023-11-03 DIAGNOSIS — E119 Type 2 diabetes mellitus without complications: Principal | ICD-10-CM

## 2023-11-03 LAB — COMPREHENSIVE METABOLIC PANEL
ALBUMIN: 3.5 g/dL (ref 3.4–5.0)
ALKALINE PHOSPHATASE: 120 U/L — ABNORMAL HIGH (ref 46–116)
ALT (SGPT): 58 U/L — ABNORMAL HIGH (ref 10–49)
ANION GAP: 13 mmol/L (ref 5–14)
AST (SGOT): 78 U/L — ABNORMAL HIGH (ref <=34)
BILIRUBIN TOTAL: 2.3 mg/dL — ABNORMAL HIGH (ref 0.3–1.2)
BLOOD UREA NITROGEN: 19 mg/dL (ref 9–23)
BUN / CREAT RATIO: 18
CALCIUM: 10.2 mg/dL (ref 8.7–10.4)
CHLORIDE: 100 mmol/L (ref 98–107)
CO2: 27 mmol/L (ref 20.0–31.0)
CREATININE: 1.07 mg/dL — ABNORMAL HIGH (ref 0.55–1.02)
EGFR CKD-EPI (2021) FEMALE: 61 mL/min/1.73m2 (ref >=60)
GLUCOSE RANDOM: 155 mg/dL — ABNORMAL HIGH (ref 70–99)
POTASSIUM: 3.6 mmol/L (ref 3.4–4.8)
PROTEIN TOTAL: 8 g/dL (ref 5.7–8.2)
SODIUM: 140 mmol/L (ref 135–145)

## 2023-11-03 LAB — PROTIME-INR
INR: 1.23
PROTIME: 14 s — ABNORMAL HIGH (ref 9.9–12.6)

## 2023-11-03 MED ORDER — PEG 3350-ELECTROLYTES 236 GRAM-22.74 GRAM-6.74 GRAM-5.86 GRAM SOLUTION
Freq: Once | ORAL | 0 refills | 1.00000 days | Status: CP
Start: 2023-11-03 — End: 2023-11-03

## 2023-11-03 MED ORDER — FUROSEMIDE 40 MG TABLET
ORAL_TABLET | ORAL | 11 refills | 30.00000 days | Status: CP
Start: 2023-11-03 — End: 2024-11-02

## 2023-11-03 MED ORDER — SPIRONOLACTONE 100 MG TABLET
ORAL_TABLET | Freq: Every day | ORAL | 11 refills | 15.00000 days | Status: CP
Start: 2023-11-03 — End: ?

## 2023-11-03 NOTE — Unmapped (Signed)
 Transplant Surgery History and Physical      Assessment/Recommendations:    Denise York is a 58 y.o. female seen in consultation at the request of Pcp, None Per Patient for evaluation of candidacy for transplantation.    I spent 30 minutes with the patient obtaining the above history and physical examination, and greater than 50% of the time was spent counseling and on the substance of the discussion.    Today we discussed liver transplantation going over the surgery to be performed, the hospital course including length of stay, anti-rejection medications and their side effects, results and the cadaveric donor system.    I discussed in detail with Denise York the risks and benefits of liver transplantation, including but not limited to: the general anesthetic, monitoring lines, the incision, the hepatectomy, as well as reimplantation of the liver graft and immunosuppressant medications. In regards to the surgical procedure, we noted that it is a major operation performed under general anesthesia with the risks of heart attack, stroke and death. Multiple invasive means of monitoring may be necessary during the operation including an arterial line, a central venous catheter, a foley catheter inserted into the bladder, and a tube from your nose into your stomach to prevent stomach distension. After surgery, the patient will go to the Intensive Care Unit and is then sent to the regular floor when medically stable. I discussed the possible complications including the need for reoperation for bleeding, infection or other complications, the possibility of clotting/leakage of blood vessels, requiring either radiological intervention, surgical intervention, or even retransplantation. I reviewed the possibility of complications involving the biliary tract including leaks, strictures and need for retransplantation for biliary complications. I discussed the possibility of primary nonfunction of the liver graft requiring urgent retransplantation or the result of death. The patient understands the need for long-term immunosuppression therapy as well as monitoring of labs and immunosuppression. Anti-rejection medications, including Prograf or Cyclosporine (Neoral), Cellcept, steroids and others, will be needed after transplantation and for the patient???s entire lifetime. Problems include infection, cancer, hirsutism, tremors, gum swelling, hypertension, bone fractures, aggravation of diabetes or new onset diabetes, cataracts, and rashes. Finally, the donor system was reviewed. All donors are tested for infections and other diseases, but there is a small chance of transmission of diseases including viruses as well as the possible transmission of tumors. Some patients may elect to receive a liver from a donor who was exposed to the Hepatitis B or Hepatitis C virus and the recipient may require certain anti-viral medications to prevent this virus from damaging the new liver.    Finally, I reviewed with the patient how the surgery is expected to improve their health and quality of life, that the average length of hospitalization stay is 10-12 days, and that the length of their expected recovery period, including when normal daily activities may be resumed, will be patient dependent.    Denise York had all their questions answered and wishes to proceed with the liver transplant evaluation process.    This patient was seen and evaluated with Dr. Dione Franks with no contraindication for transplant from a surgical perspective. 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     - Consent obtained in clinic  - Patient will be pre-admitted 2 days prior to case for bowel prep and CLD until midnight prior to surgery      HPI  Denise York is a 58 yr old female with decompensated MASH cirrhosis c/b portal HTN, hepatic  encephalopathy, and ascites. She has never required a paracentesis or had any bleeding esophageal varices. Relevant comorbidities include obesity, T2DM, HTN. HLD. Previous abdominal surgical hx of laparoscopic cholecystectomy. 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 Additionally, she has a hx of previous atrial septal defect repair, last ECHO in May 2024 showed ASD appropriately closed and otherwise WNL.     Current MELD 3.0: 14 at 11/03/2023  1:01 PM  MELD-Na: 13 at 11/03/2023  1:01 PM  Calculated from:  Serum Creatinine: 1.07 mg/dL at 4/69/6295  2:84 PM  Serum Sodium: 140 mmol/L (Using max of 137 mmol/L) at 11/03/2023  1:01 PM  Total Bilirubin: 2.3 mg/dL at 1/32/4401  0:27 PM  Serum Albumin: 3.5 g/dL at 2/53/6644  0:34 PM  INR(ratio): 1.23 at 11/03/2023  1:01 PM  Age at listing (hypothetical): 57 years  Sex: Female at 11/03/2023  1:01 PM    Cardiopulmonary testing:   CTA Cardiac 08/2023: non flow limiting coronary disease; mildly dilated right ventricle  Echo 08/2023: EF > 55% with normal wall thickness and motion; Grade II intrapulmonary shunt     Additional testing:  CT Head 07/2023: No intracranial process  Mammo 10/2023: BIRADS 1  Colonoscopy 08/2023: congested mucosa, no bleeding or specimens collected  Capsule endoscopy 08/2023: Erosive gastropathy. Single angioectasia with active bleeding in duodenum with multiple nonbleeding angioectasias    Allergies    Shellfish containing products, Glipizide , and Lisinopril       Medications      Current Outpatient Medications   Medication Sig Dispense Refill    atorvastatin  (LIPITOR ) 20 MG tablet Take 1 tablet (20 mg total) by mouth daily. 90 tablet 3    blood sugar diagnostic (GLUCOSE BLOOD) Strp Use to test blood sugar daily 100 strip 3    blood-glucose meter kit Use to test blood sugar DAILY 1 each 1    [Paused] carvedilol  (COREG ) 6.25 MG tablet Take 1 tablet (6.25 mg total) by mouth two (2) times a day. Please add to pill pack (Patient not taking: Reported on 10/02/2023) 60 tablet 11    cholecalciferol, vitamin D3 25 mcg, 1,000 units,, 1,000 unit (25 mcg) tablet Take 2 tablets (50 mcg total) by mouth daily.      DAILY-VITE, WITH FOLIC ACID, 400 mcg Tab tablet Take 1 tablet by mouth daily.      escitalopram  oxalate (LEXAPRO ) 10 MG tablet Take 1 tablet (10 mg total) by mouth daily. 90 tablet 3    furosemide  (LASIX ) 40 MG tablet Take 2 tablets (80 mg total) by mouth every morning AND 1 tablet (40 mg total) nightly. 90 tablet 11    inhalational spacing device (AEROCHAMBER MV) Spcr Use as directed with inhalers 1 each 0    lactulose  (CEPHULAC ) 20 gram packet Take 1 packet (20 g total) by mouth Three (3) times a day. 270 packet 3    lancets Misc Use to test blood sugar daily 100 each 3    loratadine  (CLARITIN ) 10 mg tablet Take 1 tablet (10 mg total) by mouth daily. 90 tablet 1    magnesium  oxide (MAG-OX) 400 mg (241.3 mg elemental magnesium ) tablet Take 1 tablet (400 mg total) by mouth daily. (Patient not taking: Reported on 11/03/2023) 90 tablet 3    metFORMIN  (GLUCOPHAGE -XR) 500 MG 24 hr tablet Take 1 tablet (500 mg total) by mouth daily with evening meal. 90 tablet 3    multivitamin (TAB-A-VITE/THERAGRAN) per tablet Take 1 tablet by mouth daily. 90 tablet 3  naltrexone  (DEPADE) 50 mg tablet Take 1 tablet (50 mg total) by mouth daily. Please add to pill packs 30 tablet 11    omeprazole  (PRILOSEC) 20 MG capsule TAKE 1 CAPSULE (20 MG TOTAL) BY MOUTH TWO (2) TIMES A DAY (30 MINUTES BEFORE A MEAL). 180 capsule 1    ondansetron  (ZOFRAN -ODT) 4 MG disintegrating tablet Dissolve 1 tablet (4 mg total) in the mouth every eight (8) hours as needed for nausea. 120 tablet 3    polyethylene glycol (GOLYTELY ) 236-22.74-6.74 gram solution Take 2,000 mL by mouth once for 1 dose. 2000 mL 0    psyllium seed, with sugar, (FIBER ORAL) Take 4 capsules by mouth daily. BJs brand. (Patient not taking: Reported on 09/11/2023)      rifAXIMin  (XIFAXAN ) 550 mg Tab Take 1 tablet (550 mg total) by mouth two (2) times a day. 180 tablet 3    semaglutide  (OZEMPIC ) 1 mg/dose (4 mg/3 mL) PnIj injection Inject 1 mg under the skin every seven (7) days. 9 mL 3    spironolactone  (ALDACTONE ) 100 MG tablet Take 2 tablets (200 mg total) by mouth daily. Add to pill packs 30 tablet 11     No current facility-administered medications for this visit.         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    Obesity     Prediabetes          Past Surgical History    Past Surgical History:   Procedure Laterality Date    ABDOMINAL WALL MESH  REMOVAL  1997, 2005    Placement, 1997, removal and replacement 2005    BLADDER SURGERY      bladder tact X2    BREAST BIOPSY Right 04/2013    benign    BREAST EXCISIONAL BIOPSY Right 05/2013    benign    BREAST SURGERY Right     precancerous spot removed    CHOLECYSTECTOMY      HYSTERECTOMY      IR EMBOLIZATION VENOUS OTHER THAN HEMORRHAGE  02/17/2023    IR EMBOLIZATION VENOUS OTHER THAN HEMORRHAGE 02/17/2023 Clifm Dame, MD IMG VIR [facility]    PR COLONOSCOPY FLX DX W/COLLJ SPEC WHEN PFRMD N/A 08/15/2023    Procedure: COLONOSCOPY, FLEXIBLE, PROXIMAL TO SPLENIC FLEXURE; DIAGNOSTIC, W/WO COLLECTION SPECIMEN BY BRUSH OR WASH;  Surgeon: Sarajane Cumming, MD;  Location: [facility] [place];  Service: Gastrointestinal    PR COLSC FLX W/RMVL OF TUMOR POLYP LESION SNARE TQ N/A 10/14/2016    Procedure: COLONOSCOPY FLEX; W/REMOV TUMOR/LES BY SNARE;  Surgeon: Roylene Corn, MD;  Location: [facility] [place];  Service: Gastroenterology    PR COLSC FLX W/RMVL OF TUMOR POLYP LESION SNARE TQ N/A 01/15/2023    Procedure: COLONOSCOPY FLEX; W/REMOV TUMOR/LES BY SNARE;  Surgeon: Gorge Laud, MD;  [facility]: [facility];  Service: Gastroenterology    PR GI IMAG INTRALUMINAL ESOPHAGUS-ILEUM W/I&R N/A 08/15/2023 Procedure: GI VIDEO TRACT IMAGE INTRALUMINAL (EG, CAPSULE ENDOSCOPY), ESOPHAGUS VIA ILEUM, PHYSICIAN INTERPRETATION & REPORT;  Surgeon: Sarajane Cumming, MD;  Location: [facility];  Service: Gastrointestinal    PR PERC CLOS,CONG INTERATRIAL COMMUN W/IMPL N/A 09/12/2022    Procedure: ASD closure;  Surgeon: Jacquie Maudlin, MD;  Location: [facility];  Service: Cardiology    PR UPPER GI ENDOSCOPY,BIOPSY N/A 01/15/2023    Procedure: UGI ENDOSCOPY; WITH BIOPSY, SINGLE OR MULTIPLE;  Surgeon: Gorge Laud, MD;  Location: [facility];  Service: Gastroenterology    PR UPPER GI ENDOSCOPY,DIAGNOSIS N/A 08/14/2023    Procedure: UGI ENDO, INCLUDE ESOPHAGUS, STOMACH, & DUODENUM &/OR JEJUNUM; DX W/WO COLLECTION SPECIMN, BY BRUSH OR WASH;  Surgeon: Sarajane Cumming, MD;  Location: [facility];  Service: Gastrointestinal    SKIN BIOPSY           Family History    The patient's family history includes Alcohol abuse in her maternal uncle; Allergies in her father and another family member; Birth defects in her son and son; COPD in her mother; Coronary artery disease in an other family member; Diabetes in her father; Gout in her father; Heart disease in her father and mother; Hypertension in her mother; Kidney disease in her father; Kidney failure in an other family member; No Known Problems in her daughter, maternal grandfather, maternal grandmother, paternal grandfather, paternal grandmother, and sister..      Social History:    Tobacco use:  reports that she has never smoked. She has been exposed to tobacco smoke. She has never used smokeless tobacco.  Alcohol use:  reports no history of alcohol use.  Drug use:  reports no history of drug use.      Review of Systems    A 12 system review of systems was negative except as noted in HPI    Objective     PE: Blood pressure 148/78, pulse 83, temperature 36.9 ??C (98.4 ??F), temperature source Tympanic, height 165.1 cm (5' 5), weight 92.3 kg (203 lb 8 oz), not currently breastfeeding. Body mass index is 33.86 kg/m??.  General: well-dressed, well-appearing  Lungs: no increased WOB on room air, symmetric chest rise  Heart: regular rate and rhythm  Abd: soft, non-distended, non-tender, no organomegaly or masses. Well healed incisions  Ascites: mild   Skin: no rashes, jaundice or skin lesions noted  Ext: no edema, well perfused  Neuro: non-focal exam. thought organized, appropriate affect, normal fluent speech        Test Results    Labs:  All lab results last 24 hours:    Recent Results (from the past 24 hours)   Comprehensive Metabolic Panel    Collection Time: 11/03/23  1:01 PM   Result Value Ref Range    Sodium 140 135 - 145 mmol/L    Potassium 3.6 3.4 - 4.8 mmol/L    Chloride 100 98 - 107 mmol/L    CO2 27.0 20.0 - 31.0 mmol/L    Anion Gap 13 5 - 14 mmol/L    BUN 19 9 - 23 mg/dL    Creatinine 1.61 (H) 0.55 - 1.02 mg/dL    BUN/Creatinine Ratio 18     eGFR CKD-EPI (2021) Female 61 >=60 mL/min/1.86m2    Glucose 155 (H) 70 - 99 mg/dL    Calcium 09.6 8.7 - 04.5 mg/dL    Albumin 3.5 3.4 - 5.0 g/dL    Total Protein 8.0 5.7 - 8.2 g/dL    Total Bilirubin 2.3 (H) 0.3 - 1.2 mg/dL    AST 78 (H) <=40 U/L    ALT 58 (H) 10 - 49 U/L    Alkaline Phosphatase  120 (H) 46 - 116 U/L   PT-INR    Collection Time: 11/03/23  1:01 PM   Result Value Ref Range    PT 14.0 (H) 9.9 - 12.6 sec    INR 1.23    Type, Blood (ABO UNOS)    Collection Time: 11/03/23  1:01 PM   Result Value Ref Range    ABO Grouping A POS        Imaging:   MRI Abdomen 06/2023:     Impression   1.  Cirrhotic liver morphology with sequelae of portal hypertension including mild splenomegaly and small caliber upper abdominal/periesophageal varices.   2.  No new focal hepatic lesion suspicious for HCC is identified.   3.  Other chronic or incidental findings, as described within the body of the report.     CTA 08/2023:   Impression   -- No evidence of active gastrointestinal hemorrhage at the time of the examination.      --Mild peripancreatic stranding which is nonspecific and could be related to portal hypertension or acute pancreatitis. Clinical correlation is recommended.      --Hepatic cirrhosis with sequela of portal hypertension including mild splenomegaly, upper abdominal and esophageal varices, and small volume ascites.      -- Additional chronic and incidental findings as described in report findings.

## 2023-11-03 NOTE — Unmapped (Signed)
 Medication change instructions given to patient's sister Stephenie Einstein.  Increase spironolactone  to 200mg , and lasix  80 mg AM and 40 mg PM.  Will repeat labs at [facility] on Monday 5/7. Orders in place.

## 2023-11-03 NOTE — Unmapped (Signed)
 Case discussed with Dr. Dione Franks who evaluated patient today and noted she had more edema than prior.    Recommendations:  -Add Lasix  40 mg nightly (in addition to 80 mg in the morning).  -Increase spironolactone  to 200 mg in the morning.  -Repeat BMP in 1 week.

## 2023-11-03 NOTE — Unmapped (Signed)
Consent forms witnessed and signed.

## 2023-11-03 NOTE — Unmapped (Addendum)
 Per Anesthesia's guidelines:    Please take the following medications the morning of your procedure with water:        Lexapro , Omeprazole , Rifaximin     Plan for preadmit to [facility] Sunday 5/11    Plan to start bowel prep with [facility] Sunday morning    Revised NPO guidelines for Ozempic  reviewed. Patient will be inpatient prior to surgery

## 2023-11-05 NOTE — Unmapped (Signed)
 Clinical Social Worker Telephone Note    Name:October JINGER VELOSO  Date of Birth:[birth year]  WJX:[id number]    REFERRAL INFORMATION:  CATHAY LIPE is waitlisted for liver transplantation . CSW follows up to assess post-transplant support planning.    IMPRESSION:   Patient scheduled for LDLT on 11/18/23.  CSW reviewing chart and appears that care giving plan has changed without notification to CSW nor TNC (see CSW eval from 1/25). CSW spoke with TNC, Corrie Fair and confirmed to her knowledge that identified care giver is patient's sister, Stephenie Einstein.     CSW placed call to recipient's sister, Stephenie Einstein to discuss above.     Stephenie Einstein clarifies that she will be taking Soraida to her appointments only and will be assisting with scheduling etc.  Stephenie Einstein resides 20 minutes away from [place]. Stephenie Einstein states if 'she needs anything, I will be there...' CSW asked what that means and she states that all communication and needs with go through her and then Stephenie Einstein will contact [place]. Asked if TNC, team etc needs to contact care giver who do we call and Stephenie Einstein states to call her.      States that Courtenay Distel is 'not the happiest person'.     Loetta Ringer and Courtenay Distel will be caring for her at home.   Stephenie Einstein confirms that Courtenay Distel is capable of administering medications, effectively communicating, following dietary recommendations, and providing physical assistance if needed.  Courtenay Distel is an ex-paramedic. Loetta Ringer is a CNA at [employer] and is in Pharmacist, hospital schedule per [place].    Kelly/DIL # [phone]  Walt/spouse # [phone]    Call to patient, [place] to discuss care giving plan/3-way call. She confirms above. Re-visited [facility] POA and patient states that she would like for her spouse, Courtenay Distel and her sister, Jenkins Mo as her emergency/ [facility] POA's.      Redmond Candle, LCSW  Transplant Case Manager  11/05/2023. 10:06 AM

## 2023-11-06 DIAGNOSIS — E119 Type 2 diabetes mellitus without complications: Principal | ICD-10-CM

## 2023-11-06 DIAGNOSIS — K7581 Nonalcoholic steatohepatitis (NASH): Principal | ICD-10-CM

## 2023-11-06 DIAGNOSIS — K7460 Unspecified cirrhosis of liver: Principal | ICD-10-CM

## 2023-11-06 NOTE — Unmapped (Signed)
 Pharmacy Pre-transplant Medication Review Note    11/06/2023   Denise York  [id number]    Evaluation of medications prior to living donor liver transplant.  Spoke with pt's sister, Stephenie Einstein, over the phone.      Age:  58 y.o. old, Gender:  female    Allergies:  Shellfish containing products, Glipizide , and Lisinopril     Immunization history:    Immunization History   Administered Date(s) Administered    COVID-19 VAC,MRNA,TRIS(12Y UP)(PFIZER)(GRAY CAP) 08/19/2020    COVID-19 VACC,MRNA,(PFIZER)(PF) 10/02/2019, 10/23/2019    Covid-19 Vac, (76yr+) (Comirnaty) Mrna Pfizer  04/08/2022, 07/14/2023    HEPATITIS B VACCINE ADULT, ADJUVANTED, IM(HEPLISAV B) 06/17/2022, 09/26/2022    Hepatitis A (Adult) 06/17/2022, 09/26/2022    INFLUENZA INJ MDCK PF, QUAD,(FLUCELVAX)(40MO AND UP EGG FREE) 05/15/2021    INFLUENZA TIV (TRI) PF (IM)(HISTORICAL) 04/09/2011    INFLUENZA VACCINE IIV3(IM)(PF)6 MOS UP 06/12/2023    Influenza TRI (IIV3) 5+yrs MDV 06/04/2013    Influenza Vaccine Quad(IM)6 MO-Adult(PF) 05/08/2017, 04/09/2018, 03/11/2019    Influenza Virus Vaccine, unspecified formulation 04/08/2015, 04/07/2016, 05/04/2020, 04/08/2022    PCV21 (Capvaxive) (Pneumococcal 21-valent Conjugate Vaccine) 09/29/2023    PNEUMOCOCCAL POLYSACCHARIDE 23-VALENT 07/19/2014    Pneumococcal Conjugate 20-valent 06/17/2022    RSV VACCINE, BIVALENT(PF)(ABRYSVO) 10/08/2023    SHINGRIX-ZOSTER VACCINE (HZV),RECOMBINANT,ADJUVANTED(IM) 11/09/2020, 05/15/2021    TdaP 07/16/2011, 06/17/2022       Home medications:  Current Outpatient Medications   Medication Instructions    atorvastatin  (LIPITOR ) 20 mg, Oral, Daily (standard)    blood sugar diagnostic (GLUCOSE BLOOD) Strp Use to test blood sugar daily    blood-glucose meter kit Use to test blood sugar DAILY    cholecalciferol (vitamin D3 25 mcg (1,000 units)) 50 mcg, Daily (standard)    DAILY-VITE, WITH FOLIC ACID, 400 mcg Tab tablet 1 tablet, Daily (standard)    escitalopram  oxalate (LEXAPRO ) 10 mg, Oral, Daily (standard)    furosemide  (LASIX ) 120 mg, Oral, Daily (standard)    inhalational spacing device (AEROCHAMBER MV) Spcr Use as directed with inhalers    lactulose  (CEPHULAC ) 20 g, Oral, 3 times a day (standard)    lancets Misc Use to test blood sugar daily    loratadine  (CLARITIN ) 10 mg, Oral, Daily (standard)    metFORMIN  (GLUCOPHAGE -XR) 500 mg, Oral, Daily    multivitamin (TAB-A-VITE/THERAGRAN) per tablet 1 tablet, Oral, Daily (standard)    naltrexone  (DEPADE) 50 mg, Oral, Daily (standard), Please add to pill packs    omeprazole  (PRILOSEC) 20 mg, Oral, 2 times a day (AC)    ondansetron  (ZOFRAN -ODT) 4 mg, orally disintegrating tablet, Every 8 hours PRN    semaglutide  (OZEMPIC ) 1 mg/dose (4 mg/3 mL) PnIj injection On hold until after surgery    spironolactone  (ALDACTONE ) 200 mg, Oral, Daily (standard), Add to pill packs    XIFAXAN  550 mg, Oral, 2 times a day (standard)     I have reviewed this medication and allergy list. There are no pharmacological concerns at this time.     Thank you,  Malcolm Scrivener, CPP, PharmD

## 2023-11-06 NOTE — Unmapped (Signed)
 Clinical Social Worker Telephone Note    Name:Denise York  Date of Birth:June 29, 1966  ZOX:[id number]    REFERRAL INFORMATION:  [name] is waitlisted for LDLT . CSW follows up to assess financial questions/planning.    IMPRESSION:   Patient scheduled for LDLT on 11/18/23.  Patient lost insurance and requested follow up by [facility] for [facility] Application.   Call to sister for assistance in requesting paperwork. LVM 2:44 PM    Received return VM and paperwork received back via email.     Redmond Candle, LCSW  Transplant Case Manager  11/06/2023. 2:41 PM

## 2023-11-10 ENCOUNTER — Ambulatory Visit: Admit: 2023-11-10 | Discharge: 2023-11-10 | Payer: PRIVATE HEALTH INSURANCE

## 2023-11-10 DIAGNOSIS — K7469 Other cirrhosis of liver: Principal | ICD-10-CM

## 2023-11-10 LAB — COMPREHENSIVE METABOLIC PANEL
ALBUMIN: 2.8 g/dL — ABNORMAL LOW (ref 3.4–5.0)
ALKALINE PHOSPHATASE: 128 U/L — ABNORMAL HIGH (ref 46–116)
ALT (SGPT): 57 U/L — ABNORMAL HIGH (ref 10–49)
ANION GAP: 15 mmol/L — ABNORMAL HIGH (ref 5–14)
AST (SGOT): 79 U/L — ABNORMAL HIGH (ref <=34)
BILIRUBIN TOTAL: 1.3 mg/dL — ABNORMAL HIGH (ref 0.3–1.2)
BLOOD UREA NITROGEN: 23 mg/dL (ref 9–23)
BUN / CREAT RATIO: 20
CALCIUM: 9.8 mg/dL (ref 8.7–10.4)
CHLORIDE: 98 mmol/L (ref 98–107)
CO2: 30.2 mmol/L (ref 20.0–31.0)
CREATININE: 1.16 mg/dL — ABNORMAL HIGH (ref 0.55–1.02)
EGFR CKD-EPI (2021) FEMALE: 55 mL/min/1.73m2 — ABNORMAL LOW (ref >=60)
GLUCOSE RANDOM: 175 mg/dL — ABNORMAL HIGH (ref 70–99)
POTASSIUM: 4.3 mmol/L (ref 3.4–4.8)
PROTEIN TOTAL: 6.5 g/dL (ref 5.7–8.2)
SODIUM: 143 mmol/L (ref 135–145)

## 2023-11-10 LAB — PROTIME-INR
INR: 1.22
PROTIME: 13.9 s — ABNORMAL HIGH (ref 9.9–12.6)

## 2023-11-10 LAB — CBC
HEMATOCRIT: 34.6 % (ref 34.0–44.0)
HEMOGLOBIN: 12.1 g/dL (ref 11.3–14.9)
MEAN CORPUSCULAR HEMOGLOBIN CONC: 35.1 g/dL (ref 32.0–36.0)
MEAN CORPUSCULAR HEMOGLOBIN: 31.5 pg (ref 25.9–32.4)
MEAN CORPUSCULAR VOLUME: 89.9 fL (ref 77.6–95.7)
MEAN PLATELET VOLUME: 11 fL — ABNORMAL HIGH (ref 6.8–10.7)
PLATELET COUNT: 41 10*9/L — ABNORMAL LOW (ref 150–450)
RED BLOOD CELL COUNT: 3.85 10*12/L — ABNORMAL LOW (ref 3.95–5.13)
RED CELL DISTRIBUTION WIDTH: 16.5 % — ABNORMAL HIGH (ref 12.2–15.2)
WBC ADJUSTED: 3.9 10*9/L (ref 3.6–11.2)

## 2023-11-10 NOTE — Unmapped (Signed)
 I have not seen any forms on your desk. Do you remember seeing this form at all?

## 2023-11-10 NOTE — Unmapped (Signed)
 Copied from CRM #[id number]. Topic: Access To Clinicians - Req Clinic Call Back  >> Nov 10, 2023 11:22 AM Kyra Phy S wrote:  Document Related: Form/Vaccine Record: 21B Form    I was unable to confirm if the document has been completed; the caller requests a status update.    Pt would like a copy of the form sent to them also.        The patient preferred contact: Cell Phone. Urgent callback turnaround time: within 24 business hours. Programmer, systems Notified)

## 2023-11-10 NOTE — Unmapped (Signed)
 Sarah, I think this is the form we sign monthly. Can you confirm?

## 2023-11-10 NOTE — Unmapped (Signed)
 Clinical Social Worker Telephone Note    Name:Denise York  Date of Birth:[birth year]  ZOX:[id number]    REFERRAL INFORMATION:  [name] is waitlisted for liver transplantation . CSW follows up to assess financial questions/planning.    IMPRESSION:   Call to [facility] for follow up regarding clarification for employment related STD/LTD, SSDI, employment etc.     Debbie agreed to obtain information needed to proceed with [employer] application.      Received email including the following:  Total income $3,640.00   Expenses $3,345.00  Patient will be over income for [facility] Medicaid. For a family size of 2, the maximum household income is $1762.50.    CSW submitted application to [employer] team.       Redmond Candle, LCSW  Transplant Case Manager  11/10/2023. 9:55 AM

## 2023-11-11 NOTE — Unmapped (Signed)
 Spoke to patient's sister, Stephenie Einstein, and let her know form was faxed.

## 2023-11-12 NOTE — Unmapped (Signed)
 At this time team has decided to postpone patients living liver donor surgery. TFC was informed that a SCA will be needed for the living liver donor surgery, [place] contracts team is working on that SCA and will let TFC know when a discussion has been made. Also patient now has [employer] and sister mentioned that patient only has about 2-3 months of money in the bank to pay for [employer]. The social worker has assisted patient in filling out a [employer] application that will help pay for premiums and the [place] care team meets once a month to decide if patient qualifies. T[facility] will be notified if patient is approved for [employer] care. TFC called patients sister to explain all this since she is listed as the contact person...TFC had already received auth for the surgery which is [id number] from case manager Truett Gab [phone] with an admit date of May 11 and surgery on May 13 but now with the change the auth number may be different.. TFC called and left vm for case manager letting her know surgery is postponed.

## 2023-11-13 NOTE — Unmapped (Signed)
 TFC received phone call back from case manager. She is going to cancel the inpatient admission auth that was given since patient is not coming in on 5/11 and she stated to call the intake line when we have a new date of admission. Listing [employer] is still valid

## 2023-11-13 NOTE — Unmapped (Signed)
 Notified patient and her family via caregiver Debbie regarding the need to postpone the LDLT.  She was disappointed, but verbalized understanding.  She will explain to the patient.  Explained that once issues are addressed, surgery will be rescheduled ASAP.

## 2023-11-26 MED ORDER — LORATADINE 10 MG TABLET
ORAL_TABLET | Freq: Every day | ORAL | 1 refills | 90.00000 days
Start: 2023-11-26 — End: ?

## 2023-11-26 NOTE — Unmapped (Signed)
 SPECIFIC INSTRUCTIONS WE DISCUSSED TODAY:  VISIT SUMMARY:    During today's visit, we discussed your sore throat, runny nose, cough, and right shoulder pain. We also reviewed your history of low platelets and liver issues, and your current medications. Your daughter accompanied you and provided additional information about your symptoms and cognitive function.    YOUR PLAN:    -UPPER RESPIRATORY INFECTION: You have an upper respiratory infection, which is causing your sore throat, runny nose, cough, and the small spots of blood in your phlegm. This is likely due to irritation from coughing. If the bleeding increases, seek emergency care immediately because of your low platelet count.    -THROMBOCYTOPENIA: Thrombocytopenia means you have a lower than normal number of platelets, which affects your blood's ability to clot. We discussed the importance of monitoring for increased bleeding, especially with your current symptoms.    -RIGHT SHOULDER PAIN: Your right shoulder pain is likely due to an issue with your rotator cuff. You have chosen to manage this conservatively with exercises until your upcoming surgery.    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 You are managing this with medication to control ammonia levels, and your family has noted occasional cognitive fluctuations related to sleep disturbances.    -GENERAL HEALTH MAINTENANCE: We are coordinating your upcoming liver surgery, including resolving insurance issues and arranging for a donor. We also discussed completing and submitting your disability paperwork.    INSTRUCTIONS:    Please seek emergency care if the bleeding in your phlegm worsens. Continue taking your medication as prescribed. Perform the shoulder exercises provided to help manage your shoulder pain. We will complete and submit your disability paperwork and follow up on the insurance resolution for your liver surgery.  Thank you for choosing [facility] for your care!    If you have a question about a recent visit or a plan that was discussed with your provider in the past: Please go to [email or web address] (or your phone app) and sign in to your [facility] Chart to send this information. I do my best to respond to messages within 3 business days, but it is not always possible to provide the most timely and complete information. For more urgent questions, CALLING THE OFFICE is always the best option.    If you are experiencing any new or worsening symptoms or you would like to talk about changing a medication or medical plan; it is best to schedule an appointment: Please go onto My [facility] Chart call our clinic at [phone] to schedule an appointment with your provider or the Acute Care Clinic or you can access the [facility] on My [facility] Chart.  If you cannot decide if your issue warrants a visit, you can always ask to discuss this with one of our clinical care team.    If you have urgent healthcare needs after normal business hours, on weekends, or during holidays:  We have an Acute Care Clinic available by appointment. Call [phone] to schedule this. We offer care for health issues that do not require the emergency room.   Monday - Thursday (8am to 7pm)  Friday (8am to 5pm)   Saturdays (9am to 1pm)   You can always reach the [facility] 24/7 Nursing Line after hours to get nurse advice. The nurse can consult with the doctor on call if indicated. Just call our main office number and follow the prompts - [phone]  You may receive a patient satisfaction survey by mail, text or email regarding your visit today. Your opinion is important to me. Your response benefits us  all :-)    If you had or will have testing done, test results will be posted on [facility] or sent via letter.  A member of our Care Team will also contact you by message or phone if your results require follow-up.Please note that specialty test results take longer. Lab results may post before I review them. We will follow up as needed when I review them.    Denise York. Denise Stanzione, MD -   [facility] - [facility]  [address], [place] [address]  Phone: [phone] Fax: [phone]  [email or web address]

## 2023-11-27 ENCOUNTER — Ambulatory Visit: Admit: 2023-11-27 | Discharge: 2023-11-27 | Payer: PRIVATE HEALTH INSURANCE

## 2023-11-27 DIAGNOSIS — D696 Thrombocytopenia, unspecified: Principal | ICD-10-CM

## 2023-11-27 DIAGNOSIS — K7682 Hepatic encephalopathy: Principal | ICD-10-CM

## 2023-11-27 DIAGNOSIS — K7581 Nonalcoholic steatohepatitis (NASH): Principal | ICD-10-CM

## 2023-11-27 DIAGNOSIS — R059 Cough, unspecified type: Principal | ICD-10-CM

## 2023-11-27 MED ORDER — LORATADINE 10 MG TABLET
ORAL_TABLET | Freq: Every day | ORAL | 1 refills | 90.00000 days | Status: CP
Start: 2023-11-27 — End: ?

## 2023-11-27 NOTE — Unmapped (Signed)
 Chief Complaint:  Chief Complaint   Patient presents with    Follow-up     Pt came with daughter    discuss papework     About disability paperwork,     Sore Throat     Started on Tuesday with a sore throat, runny nose, and cough. Throwing up.     Arm Pain     Right Arm Pain. Hurts while putting on clothes.         This patients last WCC/CPE date: : 06/27/2023  This patient's last AWV date: Denise York Last Medicare Wellness Visit Date: Not Found    History of Present Illness: (pt made aware of use of [employer] program during this visit and consented to use)  History of Present Illness  Denise York is a 58 year old female with a history of low platelets and liver issues who presents with a sore throat and right shoulder pain. She is accompanied by her daughter.    She has been experiencing a sore throat, runny nose, cough, and vomiting since Tuesday. The symptoms began simultaneously, with the sore throat being the first noticeable symptom upon waking. She describes coughing up phlegm with small spots of blood, likely due to her throat being raw from excessive coughing. No fever or chills are present. Would be interested in medication to help with cough.     She reports right shoulder pain that has been ongoing for a while. The pain is exacerbated by movement, such as putting on clothes or being hugged, and limits her range of motion. The pain worsens with use. Not interested in seeing a specialist regarding this at this time.     She mentions having low platelets, which affects her clotting ability. She confirms that she has been taking her medication to manage ammonia levels. Her daughter observes that her cognitive function fluctuates, particularly when she experiences sleep disturbances.    She also mentions experiencing shaking in her hands, which she attributes to her liver condition. She is awaiting  transplant surgery and is in the process of resolving insurance issues to cover the procedure.    Patient Care Team:  Ambrosio Junker Gareld June, MD as PCP - General (Family Medicine)  Ambrosio Junker, Gareld June, MD as PCP - Bobbette Burns, Bethanie Brooking, PA as Physician Assistant (Gastroenterology)  Jacquie Maudlin, MD as Consulting Physician (Cardiovascular Disease)  Fix, Durene Gill, MD as Evaluating Hepatologist (Gastroenterology)  Rexie Catena, RN as Transplant Coordinator (Transplant)  Bartholomew Boros as Case Manager/Social Worker (Transplant)  Franky Ivanoff as Transplant Financial Coordinator (Transplant)  Nonie Lochner, Gareld June, MD as Referring Physician (Family Medicine)  Ambrosio Junker, PsyD as Transplant Psychologist (Transplant Surgery)    Past Medical History:   Diagnosis Date    Back pain     CHF (congestive heart failure)       Depressed     Diabetes mellitus       Fatigue     Infectious viral hepatitis     Joint pain     Neoplasm of right breast, 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    Obesity     Prediabetes      Patient Active Problem List   Diagnosis    Insomnia    Vitamin deficiency    Dysthymic disorder    Restless leg syndrome    Urinary frequency    Incomplete bladder emptying    Nonspecific abnormal results of  liver function study    Edema    Vitamin D deficiency    Itch    Fatty liver disease, nonalcoholic    Constipation    Depression    Neoplasm of right breast, 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)    Type 2 diabetes mellitus without complication      Bowel incontinence    Mixed incontinence urge and stress    Encounter for long-term current use of medication    Obesity    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    Back pain    Neoplasm of uncertain behavior    Other cirrhosis of liver      Portal hypertension      Hepatic encephalopathy      Thrombocytopenia    Lower extremity edema    ASD (atrial septal defect) - s/p repair    Nausea and vomiting    Nonobstructive atherosclerosis of coronary artery    Other ascites    Pruritus    Secondary esophageal varices without bleeding      Metabolic dysfunction-associated steatohepatitis (MASH)    Intention tremor     OB History       Gravida   4    Para   4    Term   4    Preterm        AB        Living             SAB        IAB        Ectopic        Molar        Multiple        Live Births   4              Past Surgical History:   Procedure Laterality Date    ABDOMINAL WALL MESH  REMOVAL  1997, 2005    Placement, 1997, removal and replacement 2005    BLADDER SURGERY      bladder tact X2    BREAST BIOPSY Right 04/2013    benign    BREAST EXCISIONAL BIOPSY Right 05/2013    benign    BREAST SURGERY Right     precancerous spot removed    CHOLECYSTECTOMY      HYSTERECTOMY      IR EMBOLIZATION VENOUS OTHER THAN HEMORRHAGE  02/17/2023    IR EMBOLIZATION VENOUS OTHER THAN HEMORRHAGE 02/17/2023 Clifm Dame, MD IMG [facility]    PR COLONOSCOPY FLX DX W/COLLJ SPEC WHEN PFRMD N/A 08/15/2023    Procedure: COLONOSCOPY, FLEXIBLE, PROXIMAL TO SPLENIC FLEXURE; DIAGNOSTIC, W/WO COLLECTION SPECIMEN BY BRUSH OR WASH;  Surgeon: Sarajane Cumming, MD;  Location: [facility] [place];  Service: Gastrointestinal    PR COLSC FLX W/RMVL OF TUMOR POLYP LESION SNARE TQ N/A 10/14/2016    Procedure: COLONOSCOPY FLEX; W/REMOV TUMOR/LES BY SNARE;  Surgeon: Roylene Corn, MD;  Location: [facility] [place];  Service: Gastroenterology    PR COLSC FLX W/RMVL OF TUMOR POLYP LESION SNARE TQ N/A 01/15/2023    Procedure: COLONOSCOPY FLEX; W/REMOV TUMOR/LES BY SNARE;  Surgeon: Gorge Laud, MD;  Location: [facility];  Service: Gastroenterology    PR GI IMAG INTRALUMINAL ESOPHAGUS-ILEUM W/I&R N/A 08/15/2023    Procedure: GI VIDEO TRACT IMAGE INTRALUMINAL (EG, CAPSULE ENDOSCOPY), ESOPHAGUS VIA ILEUM, PHYSICIAN INTERPRETATION & REPORT;  Surgeon: Sarajane Cumming, MD;  Location: [facility];  Service: Gastrointestinal    PR PERC CLOS,CONG INTERATRIAL COMMUN W/IMPL N/A 09/12/2022    Procedure: ASD closure;  Surgeon: Jacquie Maudlin, MD;  Location: [facility];  Service: Cardiology    PR UPPER GI ENDOSCOPY,BIOPSY N/A 01/15/2023    Procedure: UGI ENDOSCOPY; WITH BIOPSY, SINGLE OR MULTIPLE;  Surgeon: Gorge Laud, MD;  Location: [facility];  Service: Gastroenterology    PR UPPER GI ENDOSCOPY,DIAGNOSIS N/A 08/14/2023    Procedure: UGI ENDO, INCLUDE ESOPHAGUS, STOMACH, & DUODENUM &/OR JEJUNUM; DX W/WO COLLECTION SPECIMN, BY BRUSH OR WASH;  Surgeon: Sarajane Cumming, MD;  Location: [facility];  Service: Gastrointestinal    SKIN BIOPSY         Allergies:  Shellfish containing products, Glipizide , and Lisinopril     Current Outpatient Medications   Medication Sig Dispense Refill    atorvastatin  (LIPITOR ) 20 MG tablet Take 1 tablet (20 mg total) by mouth daily. 90 tablet 3    blood sugar diagnostic (GLUCOSE BLOOD) Strp Use to test blood sugar daily 100 strip 3    blood-glucose meter kit Use to test blood sugar DAILY 1 each 1    cholecalciferol, vitamin D3 25 mcg, 1,000 units,, 1,000 unit (25 mcg) tablet Take 2 tablets (50 mcg total) by mouth daily.      DAILY-VITE, WITH FOLIC ACID, 400 mcg Tab tablet Take 1 tablet by mouth daily.      escitalopram  oxalate (LEXAPRO ) 10 MG tablet Take 1 tablet (10 mg total) by mouth daily. 90 tablet 3    furosemide  (LASIX ) 80 MG tablet Take 1.5 tablets (120 mg total) by mouth daily.      inhalational spacing device (AEROCHAMBER MV) Spcr Use as directed with inhalers 1 each 0    lactulose  (CEPHULAC ) 20 gram packet Take 1 packet (20 g total) by mouth Three (3) times a day. 270 packet 3    lancets Misc Use to test blood sugar daily 100 each 3    metFORMIN  (GLUCOPHAGE -XR) 500 MG 24 hr tablet Take 1 tablet (500 mg total) by mouth daily with evening meal. 90 tablet 3    multivitamin (TAB-A-VITE/THERAGRAN) per tablet Take 1 tablet by mouth daily. 90 tablet 3    naltrexone  (DEPADE) 50 mg tablet Take 1 tablet (50 mg total) by mouth daily. Please add to pill packs 30 tablet 11    omeprazole  (PRILOSEC) 20 MG capsule TAKE 1 CAPSULE (20 MG TOTAL) BY MOUTH TWO (2) TIMES A DAY (30 MINUTES BEFORE A MEAL). 180 capsule 1    ondansetron  (ZOFRAN -ODT) 4 MG disintegrating tablet Dissolve 1 tablet (4 mg total) in the mouth every eight (8) hours as needed for nausea. 120 tablet 3    rifAXIMin  (XIFAXAN ) 550 mg Tab Take 1 tablet (550 mg total) by mouth two (2) times a day. 180 tablet 3    spironolactone  (ALDACTONE ) 100 MG tablet Take 2 tablets (200 mg total) by mouth daily. Add to pill packs 30 tablet 11    dextromethorphan  HBr 5 mg/5 mL Syrp Take 10 mL by mouth every eight (8) hours as needed.      loratadine  (CLARITIN ) 10 mg tablet Take 1 tablet (10 mg total) by mouth daily. 90 tablet 1    semaglutide  (OZEMPIC ) 1 mg/dose (4 mg/3 mL) PnIj injection On hold until after surgery (Patient not taking: Reported on 11/27/2023)       No current facility-administered medications for this visit.     Social History     Socioeconomic History  Marital status: Married     Spouse name: None    Number of children: None    Years of education: None    Highest education level: None   Tobacco Use    Smoking status: Never     Passive exposure: Past    Smokeless tobacco: Never   Vaping Use    Vaping status: Never Used   Substance and Sexual Activity    Alcohol use: No    Drug use: No    Sexual activity: Not Currently     Birth control/protection: Abstinence, None   Other Topics Concern    Do you use sunscreen? Yes    Excessive sun exposure? Yes   Social History Narrative    2 births. No living children. Married new husband 2014.         02/21/2017        PCMH Components:        Family, social, cultural characteristics: social support includes best friend .      Patient has the following communication needs: none, per patient     Health Literacy: How confident are you that you understand your health issues/concerns, can participate in your care, and manage your care along with your physician: confident.    Behaviors Affecting Health: none, per patient    Family history of mental health illness and/or substance abuse: asked patient/parent and none disclosed.    Have you been seen by any medical provider that we have not referred you to since your last visit ? No    Discussed a Living Will with the patient and ZOX:WRUEAVW is under the age of 82.      Social Drivers of Health     Food Insecurity: No Food Insecurity (11/27/2023)    Hunger Vital Sign     Worried About Running Out of Food in the Last Year: Never true     Ran Out of Food in the Last Year: Never true   Transportation Needs: No Transportation Needs (11/27/2023)    PRAPARE - Therapist, art (Medical): No     Lack of Transportation (Non-Medical): No   Housing: Low Risk  (11/27/2023)    Housing     Within the past 12 months, have you ever stayed: outside, in a car, in a tent, in an overnight shelter, or temporarily in someone else's home (i.e. couch-surfing)?: No     Are you worried about losing your housing?: No     Family History   Problem Relation Age of Onset    Heart disease Mother     Hypertension Mother     COPD Mother     Heart disease Father     Kidney disease Father     Allergies Father     Gout Father     Diabetes Father     Allergies Other         FAMILY H/O    Coronary artery disease Other         FAMILY H/O    Kidney failure Other         FAMILY H/O    No Known Problems Sister     No Known Problems Daughter     No Known Problems Maternal Grandmother     No Known Problems Maternal Grandfather     No Known Problems Paternal Grandmother     No Known Problems Paternal Grandfather     Alcohol abuse Maternal Uncle  Birth defects Son     Birth defects Son     BRCA 1/2 Neg Hx     Breast cancer Neg Hx     Cancer Neg Hx     Colon cancer Neg Hx     Endometrial cancer Neg Hx     Ovarian cancer Neg Hx     Substance Abuse Disorder Neg Hx     Mental illness Neg Hx      Immunization History   Administered Date(s) Administered COVID-19 VAC,MRNA,TRIS(12Y UP)(PFIZER)(GRAY CAP) 08/19/2020    COVID-19 VACC,MRNA,(PFIZER)(PF) 10/02/2019, 10/23/2019    Covid-19 Vac, (60yr+) (Comirnaty) Mrna Pfizer  04/08/2022, 07/14/2023    HEPATITIS B VACCINE ADULT, ADJUVANTED, IM(HEPLISAV B) 06/17/2022, 09/26/2022    Hepatitis A (Adult) 06/17/2022, 09/26/2022    INFLUENZA INJ MDCK PF, QUAD,(FLUCELVAX)(25MO AND UP EGG FREE) 05/15/2021    INFLUENZA TIV (TRI) PF (IM)(HISTORICAL) 04/09/2011    INFLUENZA VACCINE IIV3(IM)(PF)6 MOS UP 06/12/2023    Influenza TRI (IIV3) 5+yrs MDV 06/04/2013    Influenza Vaccine Quad(IM)6 MO-Adult(PF) 05/08/2017, 04/09/2018, 03/11/2019    Influenza Virus Vaccine, unspecified formulation 04/08/2015, 04/07/2016, 05/04/2020, 04/08/2022    PCV21 (Capvaxive) (Pneumococcal 21-valent Conjugate Vaccine) 09/29/2023    PNEUMOCOCCAL POLYSACCHARIDE 23-VALENT 07/19/2014    Pneumococcal Conjugate 20-valent 06/17/2022    RSV VACCINE, BIVALENT(PF)(ABRYSVO) 10/08/2023    SHINGRIX-ZOSTER VACCINE (HZV),RECOMBINANT,ADJUVANTED(IM) 11/09/2020, 05/15/2021    TdaP 07/16/2011, 06/17/2022       Health Maintenance   Topic Date Due    Retinal Eye Exam  10/02/2023    COVID-19 Vaccine (6 - Pfizer risk 2024-25 season) 01/11/2024    Hemoglobin A1c  03/03/2024    Foot Exam  06/26/2024    Urine Albumin/Creatinine Ratio  06/26/2024    Mammogram  10/28/2024    Serum Creatinine Monitoring  11/09/2024    Potassium Monitoring  11/09/2024    Colon Cancer Screening  08/14/2028    DTaP/Tdap/Td Vaccines (3 - Td or Tdap) 06/17/2032    Pneumococcal Vaccine 50+  Completed    Hepatitis C Screen  Completed    Influenza Vaccine  Completed    Zoster Vaccines  Completed       I have reviewed and (if needed) updated the patient's problem list, medications, allergies, past medical and surgical history, social and family history     Review of Systems:  As per HPI     Physical Exam:  Vital Signs:  Vitals:    11/27/23 1110   BP: 102/60   Pulse: 102   Resp: 20   Temp: 36.8 ??C (98.3 ??F) SpO2: 98%     Height: 162.6 cm (5' 4)    Body mass index is 35.7 kg/m??.  Wt Readings from Last 3 Encounters:   11/27/23 94.3 kg (208 lb)   11/03/23 92.5 kg (204 lb)   11/03/23 92.3 kg (203 lb 8 oz)     No LMP recorded. Patient has had a hysterectomy.    General: mildly ill appearing no acute distress. See BMI.  Head: Normocephalic, Atraumatic  Skin: Pale -No rashes or obvious concerning lesions on exposed skin.  Cardiovascular: RRR, normal S1/S2, no murmurs, rubs or gallops. No chest wall TTP.  Extremities: pedal bilateral edema, bilateral fine L greater than L rest tremor.   Musculoskeletal: Normal tone UEs/LEs  Psychiatry: Flat affect. SLow to respond.      Imaging Studies:  No imaging ordered this visit.    Labs:  Recent Results (from the past 4 weeks)   Comprehensive Metabolic Panel    Collection  Time: 11/03/23  1:01 PM   Result Value Ref Range    Sodium 140 135 - 145 mmol/L    Potassium 3.6 3.4 - 4.8 mmol/L    Chloride 100 98 - 107 mmol/L    CO2 27.0 20.0 - 31.0 mmol/L    Anion Gap 13 5 - 14 mmol/L    BUN 19 9 - 23 mg/dL    Creatinine 0.45 (H) 0.55 - 1.02 mg/dL    BUN/Creatinine Ratio 18     eGFR CKD-EPI (2021) Female 61 >=60 mL/min/1.66m2    Glucose 155 (H) 70 - 99 mg/dL    Calcium 40.9 8.7 - 81.1 mg/dL    Albumin 3.5 3.4 - 5.0 g/dL    Total Protein 8.0 5.7 - 8.2 g/dL    Total Bilirubin 2.3 (H) 0.3 - 1.2 mg/dL    AST 78 (H) <=91 U/L    ALT 58 (H) 10 - 49 U/L    Alkaline Phosphatase 120 (H) 46 - 116 U/L   PT-INR    Collection Time: 11/03/23  1:01 PM   Result Value Ref Range    PT 14.0 (H) 9.9 - 12.6 sec    INR 1.23    Type, Blood (ABO UNOS)    Collection Time: 11/03/23  1:01 PM   Result Value Ref Range    ABO Grouping A POS    CBC    Collection Time: 11/10/23 10:57 AM   Result Value Ref Range    WBC 3.9 3.6 - 11.2 10*9/L    RBC 3.85 (L) 3.95 - 5.13 10*12/L    HGB 12.1 11.3 - 14.9 g/dL    HCT 47.8 29.5 - 62.1 %    MCV 89.9 77.6 - 95.7 fL    MCH 31.5 25.9 - 32.4 pg    MCHC 35.1 32.0 - 36.0 g/dL    RDW 30.8 (H) 65.7 - 15.2 %    MPV 11.0 (H) 6.8 - 10.7 fL    Platelet 41 (L) 150 - 450 10*9/L   PT-INR    Collection Time: 11/10/23 10:57 AM   Result Value Ref Range    PT 13.9 (H) 9.9 - 12.6 sec    INR 1.22    Comprehensive Metabolic Panel    Collection Time: 11/10/23 10:57 AM   Result Value Ref Range    Sodium 143 135 - 145 mmol/L    Potassium 4.3 3.4 - 4.8 mmol/L    Chloride 98 98 - 107 mmol/L    CO2 30.2 20.0 - 31.0 mmol/L    Anion Gap 15 (H) 5 - 14 mmol/L    BUN 23 9 - 23 mg/dL    Creatinine 8.46 (H) 0.55 - 1.02 mg/dL    BUN/Creatinine Ratio 20     eGFR CKD-EPI (2021) Female 55 (L) >=60 mL/min/1.18m2    Glucose 175 (H) 70 - 99 mg/dL    Calcium 9.8 8.7 - 96.2 mg/dL    Albumin 2.8 (L) 3.4 - 5.0 g/dL    Total Protein 6.5 5.7 - 8.2 g/dL    Total Bilirubin 1.3 (H) 0.3 - 1.2 mg/dL    AST 79 (H) <=95 U/L    ALT 57 (H) 10 - 49 U/L    Alkaline Phosphatase 128 (H) 46 - 116 U/L   RAPID INFLUENZA/RSV/COVID PCR    Collection Time: 11/27/23 11:22 AM    Specimen: Nasopharyngeal Swab   Result Value Ref Range    SARS-CoV-2 PCR Negative Negative    Influenza A Negative Negative  Influenza B Negative Negative    RSV Negative Negative     I personally spent 32 minutes face-to-face and non-face-to-face in the care of this patient, which includes all pre, intra, and post visit time on the date of service.  We discussed treatment goals and options as well as possible side effects of the prescribed medication regimen. There were no significant barriers to learning identified. The patient /and or Guardian/POA Healthcare agrees to continue the present plan except as outlined below.          EST NEW  [id number]      10-19 15-29  99213     20-29 30-44  99214     30-39 45-59  99215     40 - 55 60 - 75    Every 15 min more = [id number]      Assessment & Plan: Pathophysiology of condition(s) reviewed with patient if applicable. Pt stated understanding and there were no barriers to learning.  SEE PATIENT INSTRUCTIONS BELOW FOR DISCUSSION/PLAN    FORMS COMPLETED FOR DISABILITY.   Assessment & Plan  Upper respiratory infection  Acute sore throat, cough, runny nose, and hemoptysis suggest upper respiratory infection. Hemoptysis likely from cough irritation, but emergency care advised if bleeding increases due to thrombocytopenia.  - Advise emergency care if hemoptysis worsens.    Thrombocytopenia  Thrombocytopenia with bleeding risk. Discussed monitoring for increased bleeding, especially with hemoptysis.    Right shoulder pain  Chronic right shoulder pain, likely rotator cuff involvement. Declined orthopedist referral, prefers conservative management until surgery.  - Provide exercises to improve shoulder function.    Hepatic encephalopathy  Managed with medication for ammonia control. Family reports occasional cognitive fluctuations, related to sleep disturbances. Compliant with medication.    General Health Maintenance  Coordinating liver surgery, including financial and donor arrangements. Awaiting insurance resolution.    Follow-up  Coordinating disability paperwork and follow-up on submissions.  - Complete and submit disability paperwork.     Diagnosis ICD-10-CM Associated Orders   1. Cough, unspecified type  R05.9 RAPID INFLUENZA/RSV/COVID PCR      2. Hepatic encephalopathy    K76.82       3. Thrombocytopenia  D69.6       4. Metabolic dysfunction-associated steatohepatitis (MASH)  K75.81           Patient Instructions   SPECIFIC INSTRUCTIONS WE DISCUSSED TODAY:  VISIT SUMMARY:    During today's visit, we discussed your sore throat, runny nose, cough, and right shoulder pain. We also reviewed your history of low platelets and liver issues, and your current medications. Your daughter accompanied you and provided additional information about your symptoms and cognitive function.    YOUR PLAN:    -UPPER RESPIRATORY INFECTION: You have an upper respiratory infection, which is causing your sore throat, runny nose, cough, and the small spots of blood in your phlegm. This is likely due to irritation from coughing. If the bleeding increases, seek emergency care immediately because of your low platelet count.    -THROMBOCYTOPENIA: Thrombocytopenia means you have a lower than normal number of platelets, which affects your blood's ability to clot. We discussed the importance of monitoring for increased bleeding, especially with your current symptoms.    -RIGHT SHOULDER PAIN: Your right shoulder pain is likely due to an issue with your rotator cuff. You have chosen to manage this conservatively with exercises until your upcoming surgery.    -HEPATIC ENCEPHALOPATHY: Hepatic encephalopathy is a decline in brain function due to severe  liver disease. You are managing this with medication to control ammonia levels, and your family has noted occasional cognitive fluctuations related to sleep disturbances.    -GENERAL HEALTH MAINTENANCE: We are coordinating your upcoming liver surgery, including resolving insurance issues and arranging for a donor. We also discussed completing and submitting your disability paperwork.    INSTRUCTIONS:    Please seek emergency care if the bleeding in your phlegm worsens. Continue taking your medication as prescribed. Perform the shoulder exercises provided to help manage your shoulder pain. We will complete and submit your disability paperwork and follow up on the insurance resolution for your liver surgery.  Thank you for choosing [facility] for your care!    If you have a question about a recent visit or a plan that was discussed with your provider in the past: Please go to [email or web address] (or your phone app) and sign in to your [facility] Chart to send this information. I do my best to respond to messages within 3 business days, but it is not always possible to provide the most timely and complete information. For more urgent questions, CALLING THE OFFICE is always the best option.    If you are experiencing any new or worsening symptoms or you would like to talk about changing a medication or medical plan; it is best to schedule an appointment: Please go onto My [facility] Chart call our clinic at [phone] to schedule an appointment with your provider or the Acute Care Clinic or you can access the [facility] on My [facility] Chart.  If you cannot decide if your issue warrants a visit, you can always ask to discuss this with one of our clinical care team.    If you have urgent healthcare needs after normal business hours, on weekends, or during holidays:  We have an Acute Care Clinic available by appointment. Call [phone] to schedule this. We offer care for health issues that do not require the emergency room.   Monday - Thursday (8am to 7pm)  Friday (8am to 5pm)   Saturdays (9am to 1pm)   You can always reach the [facility] 24/7 Nursing Line after hours to get nurse advice. The nurse can consult with the doctor on call if indicated. Just call our main office number and follow the prompts - [phone]     You may receive a patient satisfaction survey by mail, text or email regarding your visit today. Your opinion is important to me. Your response benefits us  all :-)    If you had or will have testing done, test results will be posted on [facility] or sent via letter.  A member of our Care Team will also contact you by message or phone if your results require follow-up.Please note that specialty test results take longer. Lab results may post before I review them. We will follow up as needed when I review them.    Carlotta Chew. Rhett Najera, MD -   [facility]  [address], [place] [address]  Phone: [phone] Fax: [phone]  [email or web address]        Return in about 3 months (around 02/27/2024) for Recheck.    Carlotta Chew.Ambrosio Junker, MD  Family Physician    [facility]  [address]  [place], [place] [address]  Ph [phone]  Fax [phone]

## 2023-12-15 DIAGNOSIS — Z7682 Awaiting organ transplant status: Principal | ICD-10-CM

## 2023-12-16 NOTE — Unmapped (Signed)
 [facility] Cardiology at [facility]  [address] [place], [place] [address]   Phone: [phone]    Date of Service: 12/17/2023    Patient Clinic Note    PCP: Referring Provider:   Epifania Haskell, MD  [address] [facility] [place] [address]  Phone: [phone]  Fax: [phone] Teddie Favre, MD  [address] [place],  [place] [address]  Phone: [phone]  Fax: [phone]       Assessment and Plan:     Denise York is a 58 y.o. female with past medical history of DMT2, strong family history of CAD, obesity, possible neurocognitive disorder who presents for cardiac evaluation.       Nonobstructive coronary artery disease  - Continue atorvastatin  20.  Repeat lipid panel ordered for when she gets blood work done next with the liver team.    Secundum ASD status post closure  - Doing well afterwards, with resolution of her right-sided size and function.  Last echocardiogram 08/2023 shows mildly dilated RV with normal systolic function and a intrapulmonary shunt that is moderate in nature.  No early bubble crossing.  - Ideally we would like her on aspirin  81 mg daily but patient states this was stopped due to her liver dysfunction.  I think it is reasonable to keep holding for now but after her surgery would like aspirin  81 mg daily restarted when able to.    Lower extremity swelling  - No notable lower extremity edema today.  Mostly due to her underlying liver disease, possible small component of HFpEF as well.  - Continue Lasix  80 mg in a.m. and 40 in afternoon  - Continue spironolactone  200 mg daily anticipate these needs will change after liver transplantation    Diabetes type 2  - A1c well-controlled.  On metformin .  Previously on Ozempic  but patient is holding until after her surgery.    Cirrhosis  - Following with [facility] hepatology.  Patient has living donor transplant surgery scheduled for next month.    Lab Results   Component Value Date    CHOL 125 05/09/2022 CHOL 123 11/09/2020    CHOL 137 08/21/2016     Lab Results   Component Value Date    HDL 49 05/09/2022    HDL 44 11/09/2020    HDL 45 08/21/2016     Lab Results   Component Value Date    LDL 65 05/09/2022    LDL 67 11/09/2020    LDL 75 08/21/2016     Lab Results   Component Value Date    VLDL 11.2 05/09/2022    VLDL 12 11/09/2020    VLDL 63.8 08/21/2016     Lab Results   Component Value Date    CHOLHDLRATIO 2.6 05/09/2022    CHOLHDLRATIO 2.8 11/09/2020    CHOLHDLRATIO 3.0 08/21/2016     Lab Results   Component Value Date    TRIG 56 05/09/2022    TRIG 60 11/09/2020    TRIG 87 08/21/2016       The ASCVD Risk score (Arnett DK, et al., 2019) failed to calculate for the following reasons:    Risk score cannot be calculated because patient has a medical history suggesting prior/existing ASCVD    Note: For patients with SBP <90 or >200, Total Cholesterol <130 or >320, HDL <20 or >100 which are outside of the allowable range, the calculator will use these upper or lower values to calculate the  patient???s risk score.      Lab Results   Component Value Date    A1C 5.7 (H) 09/04/2023         Return in about 1 year (around 12/16/2024).          Subjective:     Chief Complaint:  Chief Complaint   Patient presents with    Routine Follow-up    Tremors     Observed. Pt stated it is due to her liver.         Referring Provider: Teddie Favre, MD    History of Present Illness:     Denise York is a 58 y.o. female, with past medical history of DMT2, strong family history of CAD, obesity, possible neurocognitive disorder who presents for cardiac evaluation.     History of Present Illness  She experiences pain in her arm, which occurs when she lifts it and can also occur spontaneously while sitting. The sensation is described as 'hurting' and is exacerbated by movement.    She is scheduled for a liver transplant on January 06, 2024, after waiting for three years, with her sister as the donor. She notes worsening swelling and frequent urination, especially after taking her diuretic medication. Her current medications include Lasix , 80 mg in the morning and 40 mg in the afternoon, spironolactone , metformin , rifaximin , and a cholesterol medication. She takes the second dose of Lasix  after lunch to avoid nocturia.    She has a history of atrial septal defect, which was closed successfully. She describes cognitive difficulties, including being in a 'fog' and having memory issues, which have progressively worsened over the past three years. She finds it challenging to perform tasks such as typing    No new chest pain, breathing problems, or syncope.          -----Presenting HPI----  Patient notes intermittent chest pains that can occur with or without exertion.  She notes they are substernal, and and intermittently occur with left hand tingling and left arm pain.  She does not exercise regularly, but walking around her house does not typically exacerbate these pains.  She does note intermittent chest pains as long as shortness of breath and back pain when she is doing her household activities such as vacuuming.  This chest pain is described as stabbing, lasting for a few seconds, intermittently associated with lightheadedness as well.        Cardiovascular History & Procedures:  Cardiovascular Problems:  Diabetes type 2  Strong family history of coronary artery disease  Secundum ASD status postclosure 2024  Cirrhosis  Nonobstructive coronary artery disease    Cath / PCI:  Secundum ASD closure 09/19/2022- Monique Ano     CV Surgery:  None    EP Procedures and Devices:  None    Non-Invasive Evaluation(s): independently reviewed the most recent study.   Echocardiogram 07/2022-normal left ventricular size and function, right ventricular: Moderately dilated with normal systolic function.  Right atrial dilation.  Suggestion of left-to-right shunting  Echocardiogram 11/09/2022-status post 32 mm Gore cardio form atrial septal closure device, LV normal size and function, RV normal size and function  Echocardiogram 08/2023-LV normal size and function LVEF greater than 55% RV mildly dilated with normal systolic function agitated saline study consistent with moderate intrapulmonary shunt no early bubble crossing.    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    Obesity     Prediabetes        Surgical History:  Past Surgical History:   Procedure Laterality Date    ABDOMINAL WALL MESH  REMOVAL  1997, 2005    Placement, 1997, removal and replacement 2005    BLADDER SURGERY      bladder tact X2    BREAST BIOPSY Right 04/2013    benign    BREAST EXCISIONAL BIOPSY Right 05/2013    benign    BREAST SURGERY Right     precancerous spot removed    CHOLECYSTECTOMY      HYSTERECTOMY      IR EMBOLIZATION VENOUS OTHER THAN HEMORRHAGE  02/17/2023    IR EMBOLIZATION VENOUS OTHER THAN HEMORRHAGE 02/17/2023 Clifm Dame, MD IMG VIR [facility]    PR COLONOSCOPY FLX DX W/COLLJ SPEC WHEN PFRMD N/A 08/15/2023    Procedure: COLONOSCOPY, FLEXIBLE, PROXIMAL TO SPLENIC FLEXURE; DIAGNOSTIC, W/WO COLLECTION SPECIMEN BY BRUSH OR WASH;  Surgeon: Sarajane Cumming, MD;  [facility];  Service: Gastrointestinal    PR COLSC FLX W/RMVL OF TUMOR POLYP LESION SNARE TQ N/A 10/14/2016    Procedure: COLONOSCOPY FLEX; W/REMOV TUMOR/LES BY SNARE;  Surgeon: Roylene Corn, MD;  Location: [facility] [place];  Service: Gastroenterology    PR COLSC FLX W/RMVL OF TUMOR POLYP LESION SNARE TQ N/A 01/15/2023    Procedure: COLONOSCOPY FLEX; W/REMOV TUMOR/LES BY SNARE;  Surgeon: Gorge Laud, MD;  [facility];  Service: Gastroenterology    PR GI IMAG INTRALUMINAL ESOPHAGUS-ILEUM W/I&R N/A 08/15/2023    Procedure: GI VIDEO TRACT IMAGE INTRALUMINAL (EG, CAPSULE ENDOSCOPY), ESOPHAGUS VIA ILEUM, PHYSICIAN INTERPRETATION & REPORT;  Surgeon: Sarajane Cumming, MD;  [facility]: [facility];  Service: Gastrointestinal    PR PERC CLOS,CONG INTERATRIAL COMMUN W/IMPL N/A 09/12/2022    Procedure: ASD closure;  Surgeon: Jacquie Maudlin, MD;  Location: [facility] CATH;  Service: Cardiology    PR UPPER GI ENDOSCOPY,BIOPSY N/A 01/15/2023    Procedure: UGI ENDOSCOPY; WITH BIOPSY, SINGLE OR MULTIPLE;  Surgeon: Gorge Laud, MD;  [facility];  Service: Gastroenterology    PR UPPER GI ENDOSCOPY,DIAGNOSIS N/A 08/14/2023    Procedure: UGI ENDO, INCLUDE ESOPHAGUS, STOMACH, & DUODENUM &/OR JEJUNUM; DX W/WO COLLECTION SPECIMN, BY BRUSH OR WASH;  Surgeon: Sarajane Cumming, MD;  [facility];  Service: Gastrointestinal    SKIN BIOPSY         Social History:   reports that she has never smoked. She has been exposed to tobacco smoke. She has never used smokeless tobacco. She reports that she does not drink alcohol and does not use drugs.  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    Review of Systems:   Except as noted in the HPI, the remainder of 10 systems reviewed is negative.     Allergies:  Allergies   Allergen Reactions    Shellfish Containing Products Anaphylaxis    Glipizide  Headache    Lisinopril  Cough       Medications:   Prior to Admission medications   Medication Dose, Route, Frequency   atorvastatin  (LIPITOR ) 20 MG tablet 20 mg, Oral, Daily (standard)   blood sugar diagnostic (GLUCOSE BLOOD) Strp Use to test blood sugar daily   blood-glucose meter kit Use to test blood sugar DAILY   cholecalciferol, vitamin D3 25 mcg, 1,000 units,, 1,000 unit (25 mcg) tablet 50 mcg, Daily (standard)   DAILY-VITE, WITH FOLIC ACID, 400 mcg Tab tablet 1 tablet, Daily (standard)   dextromethorphan  HBr 5 mg/5 mL Syrp 10 mL, Oral, Every 8 hours PRN   escitalopram  oxalate (LEXAPRO ) 10 MG tablet 10 mg, Oral, Daily (standard)   furosemide  (LASIX ) 80 MG tablet 120 mg, Daily (standard)   inhalational spacing device (AEROCHAMBER MV) Spcr Use as directed with inhalers   lactulose  (CEPHULAC ) 20 gram packet 20 g, Oral, 3 times a day (standard)   lancets Misc Use to test blood sugar daily   loratadine  (CLARITIN ) 10 mg tablet 10 mg, Oral, Daily (standard)   metFORMIN  (GLUCOPHAGE -XR) 500 MG 24 hr tablet 500 mg, Oral, Daily   multivitamin (TAB-A-VITE/THERAGRAN) per tablet 1 tablet, Oral, Daily (standard)   naltrexone  (DEPADE) 50 mg tablet 50 mg, Oral, Daily (standard), Please add to pill packs   omeprazole  (PRILOSEC) 20 MG capsule 20 mg, Oral, 2 times a day (AC)   ondansetron  (ZOFRAN -ODT) 4 MG disintegrating tablet 4 mg, orally disintegrating tablet, Every 8 hours PRN   rifAXIMin  (XIFAXAN ) 550 mg Tab 550 mg, Oral, 2 times a day (standard)   spironolactone  (ALDACTONE ) 100 MG tablet 200 mg, Oral, Daily (standard), Add to pill packs   semaglutide  (OZEMPIC ) 1 mg/dose (4 mg/3 mL) PnIj injection On hold until after surgery  Patient not taking: Reported on 12/17/2023        Objective:     Vitals  BP 131/60 (BP Site: L Arm, BP Position: Sitting, BP Cuff Size: Medium)  - Pulse (!) 9  - Wt 96.3 kg (212 lb 3.2 oz)  - SpO2 92%  - BMI 36.42 kg/m??      Wt Readings from Last 3 Encounters:   12/17/23 96.3 kg (212 lb 3.2 oz)   11/27/23 94.3 kg (208 lb)   11/03/23 92.5 kg (204 lb)       Physical Exam  General:  Pleasant female sitting in chair in nad.   Neck: Supple, JVP normal.   Resp:   CTAB bilaterally with normal WOB.   Cardio:  RRR without m/r/g.   Abdomen:   Soft, non-distended, non-tender.   Extremities: Warm well-perfused bilaterally. No edema .   MSK: No joint swelling or erythema. No gross deformities.   Skin: No rashes   Neuro: CN II-XII grossly intact. Strength grossly intact.    Psych: Alert and oriented x3. Appropriate mood.      ECG (12/17/23) - independently interpreted.   None today-last ECG in epic independently interpreted 09/04/2023-sinus bradycardia normal axis and intervals.  Heart rate 56    Most Recent Labs   Lab Results   Component Value Date    NA 143 11/10/2023    K 4.3 11/10/2023  CL 98 11/10/2023    CO2 30.2 11/10/2023     Lab Results   Component Value Date    BUN 23 11/10/2023    BUN 19 11/03/2023    BUN 16 06/11/2023    BUN 13 06/26/2015     Lab Results   Component Value Date    Creatinine Whole Blood, POC 1.1 09/04/2023    Creatinine Whole Blood, POC 1.2 (H) 12/22/2022    Creatinine 1.16 (H) 11/10/2023    Creatinine 1.07 (H) 11/03/2023    Creatinine 1.03 (H) 06/11/2023     Lab Results   Component Value Date    PRO-BNP 105.0 05/31/2023    PRO-BNP 41.8 03/11/2019     Lab Results   Component Value Date    Cholesterol, Total 125 05/09/2022    Cholesterol, Total 126 06/26/2015    Triglycerides 56 05/09/2022    Triglycerides 85 06/26/2015    HDL 45 06/26/2015    Cholesterol, HDL 49 05/09/2022    Cholesterol, Non-HDL, Calculated 76 05/09/2022    LDL Calculated 64 06/26/2015    Cholesterol, LDL, Calculated 65 05/09/2022

## 2023-12-17 ENCOUNTER — Ambulatory Visit
Admit: 2023-12-17 | Discharge: 2023-12-18 | Payer: PRIVATE HEALTH INSURANCE | Attending: Student in an Organized Health Care Education/Training Program | Primary: Student in an Organized Health Care Education/Training Program

## 2023-12-17 DIAGNOSIS — I2510 Atherosclerotic heart disease of native coronary artery without angina pectoris: Principal | ICD-10-CM

## 2023-12-17 DIAGNOSIS — K7210 Chronic hepatic failure without coma: Principal | ICD-10-CM

## 2023-12-17 DIAGNOSIS — R600 Localized edema: Principal | ICD-10-CM

## 2023-12-17 DIAGNOSIS — Q211 ASD (atrial septal defect) (HHS-HCC): Principal | ICD-10-CM

## 2023-12-17 DIAGNOSIS — Z8774 Personal history of (corrected) congenital malformations of heart and circulatory system: Principal | ICD-10-CM

## 2023-12-17 DIAGNOSIS — E119 Type 2 diabetes mellitus without complications: Principal | ICD-10-CM

## 2023-12-17 DIAGNOSIS — E785 Hyperlipidemia, unspecified: Principal | ICD-10-CM

## 2023-12-17 NOTE — Unmapped (Addendum)
 Thank you for coming to see us  today at [facility] Cardiology at [facility]. Please call us  at [phone] if you have any questions.     No changes from my end. You will do great with liver surgery !

## 2023-12-22 ENCOUNTER — Ambulatory Visit: Admit: 2023-12-22 | Discharge: 2023-12-23 | Payer: PRIVATE HEALTH INSURANCE

## 2023-12-22 ENCOUNTER — Inpatient Hospital Stay: Admit: 2023-12-22 | Discharge: 2023-12-23 | Payer: PRIVATE HEALTH INSURANCE

## 2023-12-22 DIAGNOSIS — Z01818 Encounter for other preprocedural examination: Principal | ICD-10-CM

## 2023-12-22 LAB — COMPREHENSIVE METABOLIC PANEL
ALBUMIN: 2.9 g/dL — ABNORMAL LOW (ref 3.4–5.0)
ALBUMIN: 3.2 g/dL — ABNORMAL LOW (ref 3.4–5.0)
ALKALINE PHOSPHATASE: 94 U/L (ref 46–116)
ALKALINE PHOSPHATASE: 100 U/L (ref 46–116)
ALT (SGPT): 47 U/L (ref 10–49)
ALT (SGPT): 47 U/L (ref 10–49)
ANION GAP: 10 mmol/L (ref 5–14)
ANION GAP: 10 mmol/L (ref 5–14)
AST (SGOT): 61 U/L — ABNORMAL HIGH (ref <=34)
AST (SGOT): 64 U/L — ABNORMAL HIGH (ref <=34)
BILIRUBIN TOTAL: 1.7 mg/dL — ABNORMAL HIGH (ref 0.3–1.2)
BILIRUBIN TOTAL: 1.7 mg/dL — ABNORMAL HIGH (ref 0.3–1.2)
BLOOD UREA NITROGEN: 24 mg/dL — ABNORMAL HIGH (ref 9–23)
BLOOD UREA NITROGEN: 22 mg/dL (ref 9–23)
BUN / CREAT RATIO: 17
BUN / CREAT RATIO: 15
CALCIUM: 9.7 mg/dL (ref 8.7–10.4)
CALCIUM: 9.5 mg/dL (ref 8.7–10.4)
CHLORIDE: 102 mmol/L (ref 98–107)
CHLORIDE: 102 mmol/L (ref 98–107)
CO2: 24 mmol/L (ref 20.0–31.0)
CO2: 25 mmol/L (ref 20.0–31.0)
CREATININE: 1.43 mg/dL — ABNORMAL HIGH (ref 0.55–1.02)
CREATININE: 1.46 mg/dL — ABNORMAL HIGH (ref 0.55–1.02)
EGFR CKD-EPI (2021) FEMALE: 43 mL/min/1.73m2 — ABNORMAL LOW (ref >=60)
EGFR CKD-EPI (2021) FEMALE: 42 mL/min/1.73m2 — ABNORMAL LOW (ref >=60)
GLUCOSE RANDOM: 260 mg/dL — ABNORMAL HIGH (ref 70–179)
GLUCOSE RANDOM: 191 mg/dL — ABNORMAL HIGH (ref 70–179)
POTASSIUM: 4 mmol/L (ref 3.4–4.8)
POTASSIUM: 4 mmol/L (ref 3.4–4.8)
PROTEIN TOTAL: 6.6 g/dL (ref 5.7–8.2)
PROTEIN TOTAL: 7 g/dL (ref 5.7–8.2)
SODIUM: 136 mmol/L (ref 135–145)
SODIUM: 137 mmol/L (ref 135–145)

## 2023-12-22 LAB — CBC
HEMATOCRIT: 31.3 % — ABNORMAL LOW (ref 34.0–44.0)
HEMOGLOBIN: 10.8 g/dL — ABNORMAL LOW (ref 11.3–14.9)
MEAN CORPUSCULAR HEMOGLOBIN CONC: 34.5 g/dL (ref 32.0–36.0)
MEAN CORPUSCULAR HEMOGLOBIN: 31 pg (ref 25.9–32.4)
MEAN CORPUSCULAR VOLUME: 89.8 fL (ref 77.6–95.7)
MEAN PLATELET VOLUME: 10.7 fL (ref 6.8–10.7)
PLATELET COUNT: 76 10*9/L — ABNORMAL LOW (ref 150–450)
RED BLOOD CELL COUNT: 3.48 10*12/L — ABNORMAL LOW (ref 3.95–5.13)
RED CELL DISTRIBUTION WIDTH: 15.1 % (ref 12.2–15.2)
WBC ADJUSTED: 9.1 10*9/L (ref 3.6–11.2)

## 2023-12-22 LAB — LIPID PANEL
CHOLESTEROL: 138 mg/dL (ref <200)
HDL CHOLESTEROL: 48 mg/dL — ABNORMAL LOW (ref >50)
LDL CHOLESTEROL CALCULATED: 79 mg/dL (ref <100)
NON-HDL CHOLESTEROL: 90 mg/dL (ref <130)
TRIGLYCERIDES: 75 mg/dL (ref <150)

## 2023-12-22 LAB — PROTIME-INR
INR: 1.18
PROTIME: 13.4 s — ABNORMAL HIGH (ref 9.9–12.6)

## 2023-12-22 MED ORDER — BUMETANIDE 1 MG TABLET
ORAL_TABLET | Freq: Every day | ORAL | 2 refills | 30.00000 days | Status: CP
Start: 2023-12-22 — End: 2024-03-21

## 2023-12-22 NOTE — Unmapped (Signed)
 Transplant Surgery History and Physical      Assessment/Recommendations:    Denise York is a 58 y.o. female seen in consultation at the request of Pcp, None Per Patient for evaluation of candidacy for transplantation.    I spent 30 minutes with the patient obtaining the above history and physical examination, and greater than 50% of the time was spent counseling and on the substance of the discussion.    Today we discussed liver transplantation going over the surgery to be performed, the hospital course including length of stay, anti-rejection medications and their side effects, results and the cadaveric donor system.    I discussed in detail with Denise York the risks and benefits of liver transplantation, including but not limited to: the general anesthetic, monitoring lines, the incision, the hepatectomy, as well as reimplantation of the liver graft and immunosuppressant medications. In regards to the surgical procedure, we noted that it is a major operation performed under general anesthesia with the risks of heart attack, stroke and death. Multiple invasive means of monitoring may be necessary during the operation including an arterial line, a central venous catheter, a foley catheter inserted into the bladder, and a tube from your nose into your stomach to prevent stomach distension. After surgery, the patient will go to the Intensive Care Unit and is then sent to the regular floor when medically stable. I discussed the possible complications including the need for reoperation for bleeding, infection or other complications, the possibility of clotting/leakage of blood vessels, requiring either radiological intervention, surgical intervention, or even retransplantation. I reviewed the possibility of complications involving the biliary tract including leaks, strictures and need for retransplantation for biliary complications. I discussed the possibility of primary nonfunction of the liver graft requiring urgent retransplantation or the result of death. The patient understands the need for long-term immunosuppression therapy as well as monitoring of labs and immunosuppression. Anti-rejection medications, including Prograf or Cyclosporine (Neoral), Cellcept, steroids and others, will be needed after transplantation and for the patient???s entire lifetime. Problems include infection, cancer, hirsutism, tremors, gum swelling, hypertension, bone fractures, aggravation of diabetes or new onset diabetes, cataracts, and rashes. Finally, the donor system was reviewed. All donors are tested for infections and other diseases, but there is a small chance of transmission of diseases including viruses as well as the possible transmission of tumors. Some patients may elect to receive a liver from a donor who was exposed to the Hepatitis B or Hepatitis C virus and the recipient may require certain anti-viral medications to prevent this virus from damaging the new liver.    Finally, I reviewed with the patient how the surgery is expected to improve their health and quality of life, that the average length of hospitalization stay is 10-12 days, and that the length of their expected recovery period, including when normal daily activities may be resumed, will be patient dependent.    Denise York had all their questions answered and wishes to proceed with the liver transplant evaluation process.    This patient was seen and evaluated with Dr. Meade. We will plan to proceed with living donor liver transplant in two weeks, pending the following workup and changes, given patient's recent weight gain and abdominal tenderness:      - CBC ordered - update: shows WBC 9.1.   - Abdominal ultrasound  - Optimization of current diuretic regimen, to be evaluated by transplant hepatology  - Return to clinic in 1 week (Monday 6/23)  HPI  Denise York is a 58 yr old female with decompensated MASH cirrhosis c/b portal HTN, hepatic encephalopathy, and ascites. She has never required a paracentesis or had any bleeding esophageal varices. Relevant comorbidities include obesity, T2DM, HTN. HLD. Previous abdominal surgical hx of laparoscopic cholecystectomy. 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     Since last clinic visit she has had a weight gain of approx.10 lbs, which she attributes to ozempic  discontinuation following cost concerns:  6/16: 96.7 kg (213 lb)   4/28: 92.3 kg (203 lb 8 oz)   3/06: 92.5 kg (204 lb)   She also appears with generalized edema compared to previous clinic visit ~1 month ago. She reports continuing to take lasix  as prescribed.     Current MELD 3.0: 16 at 12/22/2023 12:00 PM  MELD-Na: 14 at 12/22/2023 12:00 PM  Calculated from:  Serum Creatinine: 1.46 mg/dL at 3/83/7974 87:99 PM  Serum Sodium: 137 mmol/L at 12/22/2023 12:00 PM  Total Bilirubin: 1.7 mg/dL at 3/83/7974 87:99 PM  Serum Albumin: 3.2 g/dL at 3/83/7974 87:99 PM  INR(ratio): 1.18 at 12/22/2023 12:00 PM  Age at listing (hypothetical): 75 years  Sex: Female at 12/22/2023 12:00 PM    Allergies    Shellfish containing products, Glipizide , and Lisinopril     Medications      Current Outpatient Medications   Medication Sig Dispense Refill    atorvastatin  (LIPITOR ) 20 MG tablet Take 1 tablet (20 mg total) by mouth daily. 90 tablet 3    blood sugar diagnostic (GLUCOSE BLOOD) Strp Use to test blood sugar daily 100 strip 3    blood-glucose meter kit Use to test blood sugar DAILY 1 each 1    bumetanide  (BUMEX ) 1 MG tablet Take 2 tablets (2 mg total) by mouth daily. 60 tablet 2    cholecalciferol, vitamin D3 25 mcg, 1,000 units,, 1,000 unit (25 mcg) tablet Take 2 tablets (50 mcg total) by mouth daily.      DAILY-VITE, WITH FOLIC ACID, 400 mcg Tab tablet Take 1 tablet by mouth daily.      dextromethorphan  HBr 5 mg/5 mL Syrp Take 10 mL by mouth every eight (8) hours as needed.      escitalopram  oxalate (LEXAPRO ) 10 MG tablet Take 1 tablet (10 mg total) by mouth daily. 90 tablet 3    inhalational spacing device (AEROCHAMBER MV) Spcr Use as directed with inhalers 1 each 0    lactulose  (CEPHULAC ) 20 gram packet Take 1 packet (20 g total) by mouth Three (3) times a day. 270 packet 3    lancets Misc Use to test blood sugar daily 100 each 3    loratadine  (CLARITIN ) 10 mg tablet Take 1 tablet (10 mg total) by mouth daily. 90 tablet 1    metFORMIN  (GLUCOPHAGE -XR) 500 MG 24 hr tablet Take 1 tablet (500 mg total) by mouth daily with evening meal. 90 tablet 3    multivitamin (TAB-A-VITE/THERAGRAN) per tablet Take 1 tablet by mouth daily. 90 tablet 3    naltrexone  (DEPADE) 50 mg tablet Take 1 tablet (50 mg total) by mouth daily. Please add to pill packs 30 tablet 11    omeprazole  (PRILOSEC) 20 MG capsule TAKE 1 CAPSULE (20 MG TOTAL) BY MOUTH TWO (2) TIMES A DAY (30 MINUTES BEFORE A MEAL). 180 capsule 1    ondansetron  (ZOFRAN -ODT) 4 MG disintegrating tablet Dissolve 1 tablet (4 mg total) in the mouth every eight (8) hours as needed for nausea. 120 tablet 3  rifAXIMin  (XIFAXAN ) 550 mg Tab Take 1 tablet (550 mg total) by mouth two (2) times a day. 180 tablet 3    semaglutide  (OZEMPIC ) 1 mg/dose (4 mg/3 mL) PnIj injection On hold until after surgery (Patient not taking: Reported on 12/17/2023)      spironolactone  (ALDACTONE ) 100 MG tablet Take 2 tablets (200 mg total) by mouth daily. Add to pill packs 30 tablet 11     No current facility-administered medications for this visit.         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    Obesity     Prediabetes          Past Surgical History    Past Surgical History:   Procedure Laterality Date    ABDOMINAL WALL MESH  REMOVAL  1997, 2005    Placement, 1997, removal and replacement 2005    BLADDER SURGERY      bladder tact X2    BREAST BIOPSY Right 04/2013    benign    BREAST EXCISIONAL BIOPSY Right 05/2013    benign    BREAST SURGERY Right     precancerous spot removed    CHOLECYSTECTOMY      HYSTERECTOMY      IR EMBOLIZATION VENOUS OTHER THAN HEMORRHAGE  02/17/2023    IR EMBOLIZATION VENOUS OTHER THAN HEMORRHAGE 02/17/2023 Babara Lash, MD IMG VIR [facility]    PR COLONOSCOPY FLX DX W/COLLJ SPEC WHEN PFRMD N/A 08/15/2023    Procedure: COLONOSCOPY, FLEXIBLE, PROXIMAL TO SPLENIC FLEXURE; DIAGNOSTIC, W/WO COLLECTION SPECIMEN BY BRUSH OR WASH;  Surgeon: Charlanne Kipper, MD;  [facility];  Service: Gastrointestinal    PR COLSC FLX W/RMVL OF TUMOR POLYP LESION SNARE TQ N/A 10/14/2016    Procedure: COLONOSCOPY FLEX; W/REMOV TUMOR/LES BY SNARE;  Surgeon: Eleanor Dewey Sorrel, MD;  Location: [facility] ;  Service: Gastroenterology    PR COLSC FLX W/RMVL OF TUMOR POLYP LESION SNARE TQ N/A 01/15/2023    Procedure: COLONOSCOPY FLEX; W/REMOV TUMOR/LES BY SNARE;  Surgeon: Fae Alto, MD;  Location: [facility];  Service: Gastroenterology    PR GI IMAG INTRALUMINAL ESOPHAGUS-ILEUM W/I&R N/A 08/15/2023    Procedure: GI VIDEO TRACT IMAGE INTRALUMINAL (EG, CAPSULE ENDOSCOPY), ESOPHAGUS VIA ILEUM, PHYSICIAN INTERPRETATION & REPORT;  Surgeon: Charlanne Kipper, MD;  [facility]: [facility];  Service: Gastrointestinal    PR PERC CLOS,CONG INTERATRIAL COMMUN W/IMPL N/A 09/12/2022    Procedure: ASD closure;  Surgeon: Pia Sharper, MD;  Location: [facility] CATH;  Service: Cardiology    PR UPPER GI ENDOSCOPY,BIOPSY N/A 01/15/2023    Procedure: UGI ENDOSCOPY; WITH BIOPSY, SINGLE OR MULTIPLE;  Surgeon: Fae Alto, MD;  [facility]: [facility];  Service: Gastroenterology    PR UPPER GI ENDOSCOPY,DIAGNOSIS N/A 08/14/2023    Procedure: UGI ENDO, INCLUDE ESOPHAGUS, STOMACH, & DUODENUM &/OR JEJUNUM; DX W/WO COLLECTION SPECIMN, BY BRUSH OR WASH;  Surgeon: Charlanne Kipper, MD;  [facility]: [facility];  Service: Gastrointestinal    SKIN BIOPSY  Family History    The patient's family history includes Alcohol abuse in her maternal uncle; Allergies in her father and another family member; Birth defects in her son and son; COPD in her mother; Coronary artery disease in an other family member; Diabetes in her father; Gout in her father; Heart disease in her father and mother; Hypertension in her mother; Kidney disease in her father; Kidney failure in an other family member; No Known Problems in her daughter, maternal grandfather, maternal grandmother, paternal grandfather, paternal grandmother, and sister..    Social History:    Tobacco use:  reports that she has never smoked. She has been exposed to tobacco smoke. She has never used smokeless tobacco.  Alcohol use:  reports no history of alcohol use.  Drug use:  reports no history of drug use.    Review of Systems    A 12 system review of systems was negative except as noted in HPI    Objective     PE: Blood pressure 131/56, pulse 82, temperature 36.6 ??C (97.8 ??F), temperature source Tympanic, height 162.6 cm (5' 4.02), weight 96.7 kg (213 lb 3.2 oz), not currently breastfeeding. Body mass index is 36.58 kg/m??.  General: well-dressed, well-appearing  Lungs: no increased WOB on room air, symmetric chest rise  Heart: regular rate and rhythm  Abd: soft, distended, generalized tenderness to palpation. Well healed incisions  Ascites: mild   Skin: no rashes, jaundice or skin lesions noted  Ext: significant bilateral lower extremity edema  Neuro: non-focal exam. Tremor noted in all 4 extremities. Thoughts organized and normal fluent speech.       Test Results    Labs:  All lab results last 24 hours:    Recent Results (from the past 24 hours)   Comprehensive Metabolic Panel    Collection Time: 12/22/23  8:07 AM   Result Value Ref Range    Sodium 136 135 - 145 mmol/L    Potassium 4.0 3.4 - 4.8 mmol/L    Chloride 102 98 - 107 mmol/L    CO2 24.0 20.0 - 31.0 mmol/L    Anion Gap 10 5 - 14 mmol/L    BUN 24 (H) 9 - 23 mg/dL    Creatinine 8.56 (H) 0.55 - 1.02 mg/dL    BUN/Creatinine Ratio 17     eGFR CKD-EPI (2021) Female 43 (L) >=60 mL/min/1.53m2    Glucose 260 (H) 70 - 179 mg/dL    Calcium 9.7 8.7 - 89.5 mg/dL    Albumin 2.9 (L) 3.4 - 5.0 g/dL    Total Protein 6.6 5.7 - 8.2 g/dL    Total Bilirubin 1.7 (H) 0.3 - 1.2 mg/dL    AST 61 (H) <=65 U/L    ALT 47 10 - 49 U/L    Alkaline Phosphatase 94 46 - 116 U/L   Lipid Panel    Collection Time: 12/22/23  8:07 AM   Result Value Ref Range    Cholesterol, Total 138 <200 mg/dL    Cholesterol, HDL 48 (L) >50 mg/dL    Cholesterol, LDL, Calculated 79 <100 mg/dL    Cholesterol, Non-HDL, Calculated 90 <130 mg/dL    Triglycerides 75 <849 mg/dL    Fasting No    Comprehensive Metabolic Panel    Collection Time: 12/22/23 12:00 PM   Result Value Ref Range    Sodium 137 135 - 145 mmol/L    Potassium 4.0 3.4 - 4.8 mmol/L    Chloride 102 98 - 107 mmol/L    CO2  25.0 20.0 - 31.0 mmol/L    Anion Gap 10 5 - 14 mmol/L    BUN 22 9 - 23 mg/dL    Creatinine 8.53 (H) 0.55 - 1.02 mg/dL    BUN/Creatinine Ratio 15     eGFR CKD-EPI (2021) Female 42 (L) >=60 mL/min/1.41m2    Glucose 191 (H) 70 - 179 mg/dL    Calcium 9.5 8.7 - 89.5 mg/dL    Albumin 3.2 (L) 3.4 - 5.0 g/dL    Total Protein 7.0 5.7 - 8.2 g/dL    Total Bilirubin 1.7 (H) 0.3 - 1.2 mg/dL    AST 64 (H) <=65 U/L    ALT 47 10 - 49 U/L    Alkaline Phosphatase 100 46 - 116 U/L   PT-INR    Collection Time: 12/22/23 12:00 PM   Result Value Ref Range    PT 13.4 (H) 9.9 - 12.6 sec    INR 1.18    CBC    Collection Time: 12/22/23 12:00 PM   Result Value Ref Range    WBC 9.1 3.6 - 11.2 10*9/L    RBC 3.48 (L) 3.95 - 5.13 10*12/L    HGB 10.8 (L) 11.3 - 14.9 g/dL    HCT 68.6 (L) 65.9 - 44.0 %    MCV 89.8 77.6 - 95.7 fL    MCH 31.0 25.9 - 32.4 pg    MCHC 34.5 32.0 - 36.0 g/dL    RDW 84.8 87.7 - 84.7 %    MPV 10.7 6.8 - 10.7 fL    Platelet 76 (L) 150 - 450 10*9/L       Imaging:     CTA 08/13/2023:   -- No evidence of active gastrointestinal hemorrhage at the time of the examination.  --Mild peripancreatic stranding which is nonspecific and could be related to portal hypertension or acute pancreatitis. Clinical correlation is recommended.  --Hepatic cirrhosis with sequela of portal hypertension including mild splenomegaly, upper abdominal and esophageal varices, and small volume ascites.  -- Additional chronic and incidental findings as described in report findings.    CTA Cardiac 08/2023: non flow limiting coronary disease; mildly dilated right ventricle    Echo 08/2023: EF > 55% with normal wall thickness and motion; Grade II intrapulmonary shunt     MRI Abdomen 06/2023:   1.  Cirrhotic liver morphology with sequelae of portal hypertension including mild splenomegaly and small caliber upper abdominal/periesophageal varices.  2.  No new focal hepatic lesion suspicious for HCC is identified.  3.  Other chronic or incidental findings, as described within the body of the report.    Additional testing:  CT Head 07/2023: No intracranial process  Mammo 10/2023: BIRADS 1  Colonoscopy 08/2023: congested mucosa, no bleeding or specimens collected  Capsule endoscopy 08/2023: Erosive gastropathy. Single angioectasia with active bleeding in duodenum with multiple nonbleeding angioectasias    [facility] MS4    I attest that I have reviewed the student note and that the components of the history of the present performed in my presence by the student where I verified the documentation and performed (or re-performed) the exam and medical decision making. Naileah Karg S. Meade, MD

## 2023-12-22 NOTE — Unmapped (Signed)
Please see transplant surgery note for documentation.

## 2023-12-22 NOTE — Unmapped (Signed)
 [facility]  12/22/2023    Reason for visit: Follow up of Other cirrhosis of liver   [K74.69]    Assessment/Plan:    58 year old female presenting for follow up of decompensated MASH cirrhosis.  Her cirrhosis has been complicated by clinically significant portal hypertension, small-volume ascites, edema, and hepatic encephalopathy. Ascites has responded well to diuretics, and she has not required a therapeutic paracentesis. Continues to have intermittent HE despite splenorenal shunt embolization and lactulose /Xifaxan . No history of jaundice of esophageal variceal bleeding. Her metabolic risk factors include obesity, type 2 diabetes mellitus, hypertension, and dyslipidemia. Planned living donor liver transplant (donor: sister) on 7/1.    Decompensated cirrhosis  MELD 16. Planned living donor liver transplant (donor: sister) on 7/1.  Increase protein intake, goal 100 g/day. Discussed using protein supplements such as Glucerna.  Avoid NSAIDs.  Tylenol  up to 2 g/day is okay.    Portal hypertension  Secondary esophageal varices without bleeding   Continue carvedilol  to 6.25 mg twice daily.  She will not require EGDs for esophageal variceal surveillance if she remains on carvedilol .    Other ascites, lower extremity edema  Stop Lasix .   Start Bumex  2 mg daily.  Continue spironolactone  200 mg daily.  Daily weights.  Repeat BMP in 1 week.  Limit sodium to 2000 mg daily.    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  Continue lactulose , titrated to produce 3-5 soft bowel movements per day.   Continue Xifaxan  twice daily.  She was advised not to drive at all due to risk of traffic accidents.    Pruritus  Resolved on naltrexone  50 mg daily.       Return in about 3 months (around 03/23/2024).    Subjective   History of Present Illness   Accompanied by: friend Diane    58 y.o. 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 Her cirrhosis has been complicated by clinically significant portal hypertension, ascites, and hepatic encephalopathy. EGD 01/15/23, G1 EV and non bleeding gastric ulcers (H. Pylori negative). 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 Admitted 2/5-08/17/23 with melena and hepatic encephalopathy. EGD and colonoscopy without evidence of bleeding. Capsule endoscopy with multiple angioectasias with stigmata of recent bleeding in the small bowel and a single angioectasia with active bleeding in the small bowel. Anemia has since resolved.    Interval History  Last visit 09/29/2023. Denies interim hospitalizations. Denies abdominal pain or distention. Denies jaundice, fever, melena, or hematochezia. Denies recent episodes of HE. Continues to have lower extremity edema, on Lasix  80 mg daily and spironolactone  200 mg daily. Small volume ascites on US  today, asymptomatic. Stopped Ozempic  in May. Planned living donor liver transplant (donor: sister) on 7/1.    Objective   Physical Exam   Vital Signs: BP 131/56  - Pulse 82  - Temp 36.6 ??C (97.9 ??F) (Tympanic)  - Ht 162.6 cm (5' 4.02)  - Wt 96.7 kg (213 lb 3.2 oz)  - BMI 36.58 kg/m??   Constitutional: She is in no apparent distress  Eyes: Anicteric sclerae  Cardiovascular: Mild to moderate peripheral edema bilaterally  Gastrointestinal: Soft, non-distended abdomen with mild right upper quadrant tenderness to palpation, splenomegaly  Neurologic: Awake, alert, and oriented to person, place, and time with normal speech; mild asterixis present; mild tremor    Results for  orders placed or performed during the hospital encounter of 12/22/23   Comprehensive Metabolic Panel   Result Value Ref Range    Sodium 137 135 - 145 mmol/L    Potassium 4.0 3.4 - 4.8 mmol/L    Chloride 102 98 - 107 mmol/L    CO2 25.0 20.0 - 31.0 mmol/L    Anion Gap 10 5 - 14 mmol/L    BUN 22 9 - 23 mg/dL    Creatinine 8.53 (H) 0.55 - 1.02 mg/dL    BUN/Creatinine Ratio 15     eGFR CKD-EPI (2021) Female 42 (L) >=60 mL/min/1.74m2    Glucose 191 (H) 70 - 179 mg/dL    Calcium 9.5 8.7 - 89.5 mg/dL    Albumin 3.2 (L) 3.4 - 5.0 g/dL    Total Protein 7.0 5.7 - 8.2 g/dL    Total Bilirubin 1.7 (H) 0.3 - 1.2 mg/dL    AST 64 (H) <=65 U/L    ALT 47 10 - 49 U/L    Alkaline Phosphatase 100 46 - 116 U/L   PT-INR   Result Value Ref Range    PT 13.4 (H) 9.9 - 12.6 sec    INR 1.18    CBC   Result Value Ref Range    WBC 9.1 3.6 - 11.2 10*9/L    RBC 3.48 (L) 3.95 - 5.13 10*12/L    HGB 10.8 (L) 11.3 - 14.9 g/dL    HCT 68.6 (L) 65.9 - 44.0 %    MCV 89.8 77.6 - 95.7 fL    MCH 31.0 25.9 - 32.4 pg    MCHC 34.5 32.0 - 36.0 g/dL    RDW 84.8 87.7 - 84.7 %    MPV 10.7 6.8 - 10.7 fL    Platelet 76 (L) 150 - 450 10*9/L   Results for orders placed or performed in visit on 12/22/23   Comprehensive Metabolic Panel   Result Value Ref Range    Sodium 136 135 - 145 mmol/L    Potassium 4.0 3.4 - 4.8 mmol/L    Chloride 102 98 - 107 mmol/L    CO2 24.0 20.0 - 31.0 mmol/L    Anion Gap 10 5 - 14 mmol/L    BUN 24 (H) 9 - 23 mg/dL    Creatinine 8.56 (H) 0.55 - 1.02 mg/dL    BUN/Creatinine Ratio 17     eGFR CKD-EPI (2021) Female 43 (L) >=60 mL/min/1.22m2    Glucose 260 (H) 70 - 179 mg/dL    Calcium 9.7 8.7 - 89.5 mg/dL    Albumin 2.9 (L) 3.4 - 5.0 g/dL    Total Protein 6.6 5.7 - 8.2 g/dL    Total Bilirubin 1.7 (H) 0.3 - 1.2 mg/dL    AST 61 (H) <=65 U/L    ALT 47 10 - 49 U/L    Alkaline Phosphatase 94 46 - 116 U/L   Lipid Panel   Result Value Ref Range    Cholesterol, Total 138 <200 mg/dL    Cholesterol, HDL 48 (L) >50 mg/dL    Cholesterol, LDL, Calculated 79 <100 mg/dL    Cholesterol, Non-HDL, Calculated 90 <130 mg/dL    Triglycerides 75 <849 mg/dL    Fasting No       MELD 3.0: 16 at 12/22/2023 12:00 PM  Calculated from:  Serum Creatinine: 1.46 mg/dL at 3/83/7974 87:99 PM  Serum Sodium: 137 mmol/L at 12/22/2023 12:00 PM  Total Bilirubin: 1.7 mg/dL at 3/83/7974 87:99 PM  Serum Albumin: 3.2 g/dL at 3/83/7974  12:00 PM  INR(ratio): 1.18 at 12/22/2023 12:00 PM  Age at listing (hypothetical): 57 years  Sex: Female at 12/22/2023 12:00 PM

## 2023-12-22 NOTE — Unmapped (Addendum)
 [facility]  [facility]  Donald KATHEE Matter, [place]   [facility]  [address]  [place], [place] [address]  Main Clinic: [phone]  Appointment Schedulers: [phone]  Fax: [phone]       Thank you for allowing me to participate in your medical care today. Here are my recommendations based on today's visit:    Stop Lasix  (furosemide ).  Start Bumex  (bumetadine) 2 mg daily.  Continue spironolactone  200 mg daily.  Repeat labs in 1 week.  Limit salt (sodium) intake to less than 2000 mg per day.  Adjust lactulose  dose or frequency to produce a target of 3-4 soft bowel movements a day. Continue Xifaxan  twice daily. Do not drive given risks of traffic accidents.  Avoid NSAIDs (ibuprofen, naproxen, Advil, Motrin, Aleve, Naprosyn, etc). Acetaminophen  (Tylenol ) up to 2000 mg a day is ok.    Take care,  Donald KATHEE Matter, PA

## 2023-12-23 NOTE — Unmapped (Addendum)
 Abd US  and CBC from 6/16 reviewed with Dr. Meade and Donald Matter.  No need for diagnostic para at this time.  Patient will return to clinic on Monday 6/23 for follow up/pre op for LDLT.    Spoke to patient's sister about plan of care. Denise York is still having some mild abdominal pain.  Followed up with patient about this pain and she described it as dull, coming and going and she does not have it every day.  For example she has no pain today.   No fevers, no chills.  CBC from 6/16 reviewed.  Requested that she let us  know immediately if her health changes at any time between now and her surgery.    Explained that a coordinator would checking in with her on Friday 6/20 re medication changes made in clinic yesterday.

## 2023-12-24 NOTE — Unmapped (Signed)
 Called patient in regards to getting an appointment added with the Transplant Social Forbes Mania on her already scheduled day of Monday, June 23rd. Let her know that she will still see the Transplant Surgeon at 11am and then see Krista at 11:30am. She stated understanding to all.

## 2023-12-26 NOTE — Unmapped (Signed)
 Call placed and spoke with Denise York e- reports feeling well with change to diuretics and no c/o abd pain or discomfort. Appt with Dr Meade in place for Monday 6.23.

## 2023-12-26 NOTE — Unmapped (Signed)
 Insurance has been verified      Patient has active coverage with [employer] [facility]), including prescription drug coverage via [employer] [phone]     Authorization is [id number] for actual living Liver donor transplant with patient being admitted on 01/04/24 and transplanted on 01/06/24 per case manager Maggie [phone]. Call on 7/2 to let her know patient actual did receive transplant      Patient is financially cleared for living Liver donor transplant

## 2023-12-29 ENCOUNTER — Ambulatory Visit: Admit: 2023-12-29 | Discharge: 2023-12-30 | Payer: PRIVATE HEALTH INSURANCE

## 2023-12-29 ENCOUNTER — Ambulatory Visit: Admit: 2023-12-29 | Discharge: 2023-12-29 | Payer: PRIVATE HEALTH INSURANCE

## 2023-12-29 LAB — BASIC METABOLIC PANEL
ANION GAP: 10 mmol/L (ref 5–14)
BLOOD UREA NITROGEN: 20 mg/dL (ref 9–23)
BUN / CREAT RATIO: 18
CALCIUM: 9.3 mg/dL (ref 8.7–10.4)
CHLORIDE: 105 mmol/L (ref 98–107)
CO2: 23 mmol/L (ref 20.0–31.0)
CREATININE: 1.09 mg/dL — ABNORMAL HIGH (ref 0.55–1.02)
EGFR CKD-EPI (2021) FEMALE: 59 mL/min/{1.73_m2} — ABNORMAL LOW (ref >=60)
GLUCOSE RANDOM: 159 mg/dL — ABNORMAL HIGH (ref 70–99)
POTASSIUM: 4.9 mmol/L — ABNORMAL HIGH (ref 3.4–4.8)
SODIUM: 138 mmol/L (ref 135–145)

## 2023-12-29 NOTE — Unmapped (Addendum)
 [facility]  [facility]  Donald KATHEE Matter, [place]   [facility]  [address]  [place], [place] [address]  Main Clinic: [phone]  Appointment Schedulers: [phone]  Fax: [phone]       Thank you for allowing me to participate in your medical care today. Here are my recommendations based on today's visit:    Restart Ozempic .  Week 1: Restart Ozempic  0.5 mg  Week 2: Ozempic  1 mg  Week 3: Ozempic  1.5 mg  Week 4: Ozempic  2 mg    Continue Bumex  2 mg daily and spironolactone  200 mg daily.  Let us  know if your fluid retention worsens.  Repeat labs in 2 weeks.    For constipation:  Increase lactulose  to produce 3-5 soft bowel movements daily.  You can add Miralax as needed for constipation.  Try Orlando Magnesium  capsules, 2 nightly, for constipation.    Take care,  Donald KATHEE Matter, PA

## 2023-12-29 NOTE — Unmapped (Signed)
 Transplant Surgery History and Physical      Assessment/Recommendations:    Denise York is a 58 y.o. female seen in consultation at the request of Pcp, None Per Patient for evaluation of candidacy for transplantation.    I spent 30 minutes with the patient obtaining the below history and physical examination, and greater than 50% of the time was spent counseling and on the substance of the discussion.    Today we discussed liver transplantation going over the surgery to be performed, the hospital course including length of stay, anti-rejection medications and their side effects, results and the cadaveric donor system.    I discussed in detail with [name] the risks and benefits of liver transplantation, including but not limited to: the general anesthetic, monitoring lines, the incision, the hepatectomy, as well as reimplantation of the liver graft and immunosuppressant medications. In regards to the surgical procedure, we noted that it is a major operation performed under general anesthesia with the risks of heart attack, stroke and death. Multiple invasive means of monitoring may be necessary during the operation including an arterial line, a central venous catheter, a foley catheter inserted into the bladder, and a tube from your nose into your stomach to prevent stomach distension. After surgery, the patient will go to the Intensive Care Unit and is then sent to the regular floor when medically stable. I discussed the possible complications including the need for reoperation for bleeding, infection or other complications, the possibility of clotting/leakage of blood vessels, requiring either radiological intervention, surgical intervention, or even retransplantation. I reviewed the possibility of complications involving the biliary tract including leaks, strictures and need for retransplantation for biliary complications. I discussed the possibility of primary nonfunction of the liver graft requiring urgent retransplantation or the result of death. The patient understands the need for long-term immunosuppression therapy as well as monitoring of labs and immunosuppression. Anti-rejection medications, including Prograf or Cyclosporine (Neoral), Cellcept, steroids and others, will be needed after transplantation and for the patient???s entire lifetime. Problems include infection, cancer, hirsutism, tremors, gum swelling, hypertension, bone fractures, aggravation of diabetes or new onset diabetes, cataracts, and rashes. Finally, the donor system was reviewed. All donors are tested for infections and other diseases, but there is a small chance of transmission of diseases including viruses as well as the possible transmission of tumors. Some patients may elect to receive a liver from a donor who was exposed to the Hepatitis B or Hepatitis C virus and the recipient may require certain anti-viral medications to prevent this virus from damaging the new liver.    Finally, I reviewed with the patient how the surgery is expected to improve their health and quality of life, that the average length of hospitalization stay is 10-12 days, and that the length of their expected recovery period, including when normal daily activities may be resumed, will be patient dependent.      This patient was seen and evaluated with Dr. Meade. Given patient's ongoing weight gain despite appropriate diuresis, we will plan to defer living donor liver transplant until she is below 90 kg as she currently is at too great a risk for small-for-size syndrome to be an appropriate candidate, as her donor's right hepatic lobe is 757 g (prior to inevitable loss of mass during donation process). The plan will be as follows:    - Restart ozempic  as per hepatology  - Continue Bumex  2mg  daily  - Return to clinic as desired to check in until  reaching 90 kg, at which point LDLT will be rescheduled    HPI  Denise York is a 58 yr old female with decompensated MASH cirrhosis c/b portal HTN, hepatic encephalopathy, and ascites. She has never required a paracentesis or had any bleeding esophageal varices. Relevant comorbidities include obesity, T2DM, HTN. HLD. Previous abdominal surgical hx of laparoscopic cholecystectomy. 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     Since last clinic visit she has had continued weight gain, which she attributes to ozempic  discontinuation determined to be due to side effect of constipation:  6/23: 100.2 kg (221 lb)  6/16: 96.7 kg (213 lb)   4/28: 92.3 kg (203 lb 8 oz)   3/06: 92.5 kg (204 lb)   Bilateral lower extremity edema is improved compared to previous clinic visit 1 week ago, following discontinuation of lasix  and initiation of Bumex . Patient reports she was taking 2mg  ozempic  at the time of her discontinuation at the end of March.     Current MELD 3.0: 16 at 12/22/2023 12:00 PM  MELD-Na: 14 at 12/22/2023 12:00 PM  Calculated from:  Serum Creatinine: 1.46 mg/dL at 3/83/7974 87:99 PM  Serum Sodium: 137 mmol/L at 12/22/2023 12:00 PM  Total Bilirubin: 1.7 mg/dL at 3/83/7974 87:99 PM  Serum Albumin: 3.2 g/dL at 3/83/7974 87:99 PM  INR(ratio): 1.18 at 12/22/2023 12:00 PM  Age at listing (hypothetical): 58 years  Sex: Female at 12/22/2023 12:00 PM    Allergies    Shellfish containing products, Glipizide , and Lisinopril     Medications      Current Outpatient Medications   Medication Sig Dispense Refill    atorvastatin  (LIPITOR ) 20 MG tablet Take 1 tablet (20 mg total) by mouth daily. 90 tablet 3    blood sugar diagnostic (GLUCOSE BLOOD) Strp Use to test blood sugar daily 100 strip 3    blood-glucose meter kit Use to test blood sugar DAILY 1 each 1    bumetanide  (BUMEX ) 1 MG tablet Take 2 tablets (2 mg total) by mouth daily. 60 tablet 2    cholecalciferol, vitamin D3 25 mcg, 1,000 units,, 1,000 unit (25 mcg) tablet Take 2 tablets (50 mcg total) by mouth daily.      DAILY-VITE, WITH FOLIC ACID, 400 mcg Tab tablet Take 1 tablet by mouth daily.      dextromethorphan  HBr 5 mg/5 mL Syrp Take 10 mL by mouth every eight (8) hours as needed.      escitalopram  oxalate (LEXAPRO ) 10 MG tablet Take 1 tablet (10 mg total) by mouth daily. 90 tablet 3    inhalational spacing device (AEROCHAMBER MV) Spcr Use as directed with inhalers 1 each 0    lactulose  (CEPHULAC ) 20 gram packet Take 1 packet (20 g total) by mouth Three (3) times a day. 270 packet 3    lancets Misc Use to test blood sugar daily 100 each 3    loratadine  (CLARITIN ) 10 mg tablet Take 1 tablet (10 mg total) by mouth daily. 90 tablet 1    metFORMIN  (GLUCOPHAGE -XR) 500 MG 24 hr tablet Take 1 tablet (500 mg total) by mouth daily with evening meal. 90 tablet 3    multivitamin (TAB-A-VITE/THERAGRAN) per tablet Take 1 tablet by mouth daily. 90 tablet 3    naltrexone  (DEPADE) 50 mg tablet Take 1 tablet (50 mg total) by mouth daily. Please add to pill packs 30 tablet 11    omeprazole  (PRILOSEC) 20 MG capsule TAKE 1 CAPSULE (20 MG TOTAL) BY MOUTH TWO (2)  TIMES A DAY (30 MINUTES BEFORE A MEAL). 180 capsule 1    ondansetron  (ZOFRAN -ODT) 4 MG disintegrating tablet Dissolve 1 tablet (4 mg total) in the mouth every eight (8) hours as needed for nausea. 120 tablet 3    rifAXIMin  (XIFAXAN ) 550 mg Tab Take 1 tablet (550 mg total) by mouth two (2) times a day. 180 tablet 3    semaglutide  (OZEMPIC ) 1 mg/dose (4 mg/3 mL) PnIj injection On hold until after surgery (Patient not taking: Reported on 12/17/2023)      spironolactone  (ALDACTONE ) 100 MG tablet Take 2 tablets (200 mg total) by mouth daily. Add to pill packs 30 tablet 11     No current facility-administered medications for this visit.         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    Obesity     Prediabetes          Past Surgical History    Past Surgical History:   Procedure Laterality Date    ABDOMINAL WALL MESH  REMOVAL  1997, 2005    Placement, 1997, removal and replacement 2005    BLADDER SURGERY      bladder tact X2    BREAST BIOPSY Right 04/2013    benign    BREAST EXCISIONAL BIOPSY Right 05/2013    benign    BREAST SURGERY Right     precancerous spot removed    CHOLECYSTECTOMY      HYSTERECTOMY      IR EMBOLIZATION VENOUS OTHER THAN HEMORRHAGE  02/17/2023    IR EMBOLIZATION VENOUS OTHER THAN HEMORRHAGE 02/17/2023 Babara Lash, MD IMG VIR H&V [facility]    PR COLONOSCOPY FLX DX W/COLLJ SPEC WHEN PFRMD N/A 08/15/2023    Procedure: COLONOSCOPY, FLEXIBLE, PROXIMAL TO SPLENIC FLEXURE; DIAGNOSTIC, W/WO COLLECTION SPECIMEN BY BRUSH OR WASH;  Surgeon: Charlanne Kipper, MD;  Location: [facility] [place];  Service: Gastrointestinal    PR COLSC FLX W/RMVL OF TUMOR POLYP LESION SNARE TQ N/A 10/14/2016    Procedure: COLONOSCOPY FLEX; W/REMOV TUMOR/LES BY SNARE;  Surgeon: Eleanor Dewey Sorrel, MD;  Location: [facility] [place];  Service: Gastroenterology    PR COLSC FLX W/RMVL OF TUMOR POLYP LESION SNARE TQ N/A 01/15/2023    Procedure: COLONOSCOPY FLEX; W/REMOV TUMOR/LES BY SNARE;  Surgeon: Fae Alto, MD;  Location: [facility];  Service: Gastroenterology    PR GI IMAG INTRALUMINAL ESOPHAGUS-ILEUM W/I&R N/A 08/15/2023    Procedure: GI VIDEO TRACT IMAGE INTRALUMINAL (EG, CAPSULE ENDOSCOPY), ESOPHAGUS VIA ILEUM, PHYSICIAN INTERPRETATION & REPORT;  Surgeon: Charlanne Kipper, MD;  [facility]: [facility];  Service: Gastrointestinal    PR PERC CLOS,CONG INTERATRIAL COMMUN W/IMPL N/A 09/12/2022    Procedure: ASD closure;  Surgeon: Pia Sharper, MD;  Location: [facility] C[facility];  Service: Cardiology    PR UPPER GI ENDOSCOPY,BIOPSY N/A 01/15/2023    Procedure: UGI ENDOSCOPY; WITH BIOPSY, SINGLE OR MULTIPLE;  Surgeon: Fae Alto, MD;  [facility]: [facility];  Service: Gastroenterology    PR UPPER GI ENDOSCOPY,DIAGNOSIS N/A 08/14/2023  Procedure: UGI ENDO, INCLUDE ESOPHAGUS, STOMACH, & DUODENUM &/OR JEJUNUM; DX W/WO COLLECTION SPECIMN, BY BRUSH OR WASH;  Surgeon: Charlanne Kipper, MD;  [facility]: [facility];  Service: Gastrointestinal    SKIN BIOPSY           Family History    The patient's family history includes Alcohol abuse in her maternal uncle; Allergies in her father and another family member; Birth defects in her son and son; COPD in her mother; Coronary artery disease in an other family member; Diabetes in her father; Gout in her father; Heart disease in her father and mother; Hypertension in her mother; Kidney disease in her father; Kidney failure in an other family member; No Known Problems in her daughter, maternal grandfather, maternal grandmother, paternal grandfather, paternal grandmother, and sister..    Social History:    Tobacco use:  reports that she has never smoked. She has been exposed to tobacco smoke. She has never used smokeless tobacco.  Alcohol use:  reports no history of alcohol use.  Drug use:  reports no history of drug use.    Review of Systems    A 12 system review of systems was negative except as noted in HPI    Objective     PE: Blood pressure 135/64, pulse 73, temperature 36.7 ??C (98 ??F), temperature source Tympanic, weight 100.2 kg (221 lb), SpO2 100%, not currently breastfeeding. Body mass index is 37.92 kg/m??.  General: well-dressed, well-appearing  Lungs: no increased WOB on room air, symmetric chest rise  Heart: regular rate and rhythm  Abd: soft, distended, non-tender. Well healed incisions  Ascites: mild   Skin: no rashes, jaundice or skin lesions noted  Ext: bilateral lower extremity edema, improved from prior clinic visit  Neuro: non-focal exam. Tremor noted in all 4 extremities. Thoughts organized and normal fluent speech.       Test Results    Labs:  All lab results last 24 hours:    Recent Results (from the past 24 hours)   Basic Metabolic Panel    Collection Time: 12/29/23 10:19 AM   Result Value Ref Range    Sodium 138 135 - 145 mmol/L    Potassium 4.9 (H) 3.4 - 4.8 mmol/L    Chloride 105 98 - 107 mmol/L    CO2 23.0 20.0 - 31.0 mmol/L    Anion Gap 10 5 - 14 mmol/L    BUN 20 9 - 23 mg/dL    Creatinine 8.90 (H) 0.55 - 1.02 mg/dL    BUN/Creatinine Ratio 18     eGFR CKD-EPI (2021) Female 59 (L) >=60 mL/min/1.69m2    Glucose 159 (H) 70 - 99 mg/dL    Calcium 9.3 8.7 - 89.5 mg/dL         Imaging:     Abd US  12/22/23:  - Cirrhotic liver. No focal hepatic lesion is identified, although liver parenchyma was difficult to penetrate.   - No intra- or extrahepatic biliary ductal dilatation.   - Splenomegaly (14.8 cm). Small volume perisplenic ascites.  - Partially visualized upper abdominal varices which are better assessed on below CT.   - A splenorenal shunt is visualized.     CTA 08/13/2023:   -- No evidence of active gastrointestinal hemorrhage at the time of the examination.  --Mild peripancreatic stranding which is nonspecific and could be related to portal hypertension or acute pancreatitis. Clinical correlation is recommended.  --Hepatic cirrhosis with sequela of portal hypertension including mild splenomegaly, upper abdominal and esophageal varices,  and small volume ascites.  -- Additional chronic and incidental findings as described in report findings.    CTA Cardiac 08/2023: non flow limiting coronary disease; mildly dilated right ventricle    Echo 08/2023: EF > 55% with normal wall thickness and motion; Grade II intrapulmonary shunt     MRI Abdomen 06/2023:   1.  Cirrhotic liver morphology with sequelae of portal hypertension including mild splenomegaly and small caliber upper abdominal/periesophageal varices.  2.  No new focal hepatic lesion suspicious for HCC is identified.  3.  Other chronic or incidental findings, as described within the body of the report.    Additional testing:  CT Head 07/2023: No intracranial process  Mammo 10/2023: BIRADS 1  Colonoscopy 08/2023: congested mucosa, no bleeding or specimens collected  Capsule endoscopy 08/2023: Erosive gastropathy. Single angioectasia with active bleeding in duodenum with multiple nonbleeding angioectasias    [facility] MS4      I attest that I have reviewed the student note and that the components of the history of the present performed in my presence by the student where I verified the documentation and performed (or re-performed) the exam and medical decision making. Chirag S. Meade, MD

## 2023-12-30 NOTE — Unmapped (Signed)
 [facility] called covering case manager and left voicemail letting her know that the living liver surgery is canceled for 01/06/24

## 2023-12-31 NOTE — Unmapped (Signed)
 [facility]  12/29/2023    Reason for visit: Follow up of Decompensated cirrhosis   [K72.90, K74.60]    Assessment/Plan:    58 year old female presenting for follow up of decompensated MASH cirrhosis.  Her cirrhosis has been complicated by clinically significant portal hypertension, small-volume ascites, edema, and hepatic encephalopathy. Ascites has responded well to diuretics, and she has not required a therapeutic paracentesis. Continues to have intermittent HE despite splenorenal shunt embolization and lactulose /Xifaxan . No history of jaundice of esophageal variceal bleeding. Her metabolic risk factors include obesity, type 2 diabetes mellitus, hypertension, and dyslipidemia. Despite improvement in edema on diuretics, her weight continues to increase. She self-discontinued Ozempic  in March due to constipation. At present, she has risk of small-for-size syndrome given size of donor's right hepatic lobe, and it is not safe to proceed with LDLT until her weight is less than or equal to 95 kg. Resume Ozempic  as noted below and continue weight loss efforts. Plan for LDLT once weight loss achieved.    Decompensated cirrhosis  MELD 16. LDLT deferred until weight is less than or equal to 95 kg.  Increase protein intake, goal 100 g/day. Discussed using protein supplements such as Glucerna.  Avoid NSAIDs.  Tylenol  up to 2 g/day is okay.    Portal hypertension  Secondary esophageal varices without bleeding   Continue carvedilol  to 6.25 mg twice daily.  She will not require EGDs for esophageal variceal surveillance if she remains on carvedilol .    Other ascites, lower extremity edema  Continue Bumex  2 mg daily and spironolactone  200 mg daily.  She was advised to let us  know if fluid retention worsens or she wishes to increase the dosing.  Repeat labs in 2 weeks.  Daily weights.  Limit sodium to 2000 mg daily.    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  Continue lactulose , titrated to produce 3-5 soft bowel movements per day.   Continue Xifaxan  twice daily.  She was advised not to drive at all due to risk of traffic accidents.    Obesity, BMI 37.92  Restart Ozempic .  Week 1: Restart Ozempic  0.5 mg  Week 2: Ozempic  1 mg  Week 3: Ozempic  1.5 mg  Week 4: Ozempic  2 mg    Pruritus  Resolved on naltrexone  50 mg daily.     Constipation  Increase lactulose  to produce 3-5 soft bowel movements daily.  You can add Miralax as needed for constipation.  Try [place] Magnesium  capsules, 2 nightly, for constipation.    Return in 7 weeks (on 02/16/2024).    Subjective   History of Present Illness   Accompanied by: daughter    58 y.o. 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 Her cirrhosis has been complicated by clinically significant portal hypertension, ascites, and hepatic encephalopathy. EGD 01/15/23, G1 EV and non bleeding gastric ulcers (H. Pylori negative). 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 Admitted 2/5-08/17/23 with melena and hepatic encephalopathy. EGD and colonoscopy without evidence of bleeding. Capsule endoscopy with multiple angioectasias with stigmata of recent bleeding in the small bowel and a single angioectasia with active bleeding in the small bowel. Anemia has since resolved.    Interval History  Last visit 12/22/2023. Denies interim hospitalizations. Denies abdominal pain or distention. Denies jaundice, fever, melena, or hematochezia. Denies recent episodes of HE. Notes improvement  in lower extremity edema with new diuretic regimen started 1 week ago, however, weight continues to increase. Does not wish to increase diuretics further at this time given frequent urination. Small volume ascites on US  last week, asymptomatic. Notes she stopped Ozempic  in March due to constipation. She does not drive given history of HE.    Objective   Physical Exam   Vital Signs: BP 135/64 (BP Position: Sitting, BP Cuff Size: Large)  - Pulse 73  - Temp 36.7 ??C (98 ??F) (Tympanic)  - Ht 162.6 cm (5' 4.02)  - Wt 100.2 kg (221 lb)  - SpO2 100%  - BMI 37.92 kg/m??   Constitutional: She is in no apparent distress  Eyes: Anicteric sclerae  Cardiovascular: Mild to moderate peripheral edema bilaterally, improved from last week  Neurologic: Awake, alert, and oriented to person, place, and time with normal speech; mild tremor    Lab Results   Component Value Date    WBC 9.1 12/22/2023    HGB 10.8 (L) 12/22/2023    HCT 31.3 (L) 12/22/2023    PLT 76 (L) 12/22/2023     Lab Results   Component Value Date    NA 138 12/29/2023    K 4.9 (H) 12/29/2023    CL 105 12/29/2023    CO2 23.0 12/29/2023    BUN 20 12/29/2023    CREATININE 1.09 (H) 12/29/2023    GLU 159 (H) 12/29/2023    CALCIUM 9.3 12/29/2023     Lab Results   Component Value Date    BILITOT 1.7 (H) 12/22/2023    PROT 7.0 12/22/2023    ALBUMIN 3.2 (L) 12/22/2023    ALT 47 12/22/2023    AST 64 (H) 12/22/2023    ALKPHOS 100 12/22/2023     Lab Results   Component Value Date    PT 13.4 (H) 12/22/2023    INR 1.18 12/22/2023      MELD 3.0: 16 at 12/22/2023 12:00 PM  Calculated from:  Serum Creatinine: 1.46 mg/dL at 3/83/7974 87:99 PM  Serum Sodium: 137 mmol/L at 12/22/2023 12:00 PM  Total Bilirubin: 1.7 mg/dL at 3/83/7974 87:99 PM  Serum Albumin: 3.2 g/dL at 3/83/7974 87:99 PM  INR(ratio): 1.18 at 12/22/2023 12:00 PM  Age at listing (hypothetical): 81 years  Sex: Female at 12/22/2023 12:00 PM    I personally spent 30 minutes face-to-face and non-face-to-face in the care of this patient, which includes all pre, intra, and post visit time on the date of service.

## 2024-01-06 MED FILL — XIFAXAN 550 MG TABLET: ORAL | 90 days supply | Qty: 180 | Fill #1

## 2024-01-13 MED ORDER — OZEMPIC 2 MG/DOSE (8 MG/3 ML) SUBCUTANEOUS PEN INJECTOR
SUBCUTANEOUS | 2 refills | 0.00000 days | Status: CP
Start: 2024-01-13 — End: ?

## 2024-01-13 NOTE — Unmapped (Signed)
 PA request for Ozempic  via CoverMyMeds (Key Code: [id number]).    Will route to Transplant Coordinator and [employer].

## 2024-01-13 NOTE — Unmapped (Signed)
 Pt request for RX Refill

## 2024-01-13 NOTE — Unmapped (Signed)
 Patient did mention the shoulder pain at her last appointment in May.

## 2024-01-13 NOTE — Unmapped (Signed)
 No answer. Unable to leave a message.

## 2024-01-13 NOTE — Unmapped (Signed)
 Copied from CRM #[id number]. Topic: Access To Clinicians - Req Clinic Call Back  >> Jan 13, 2024 11:01 AM Louvella C wrote:  Reason of the Call: Pt would like sleep medication that helps them sleep at night due to their ongoing shoulder problems.     Requesting: Referral for ongoing should problems/pain      Appointment: The patient was last seen on 11/27/23.      The patient preferred contact: Cell Phone  Routine callback turnaround time: 24-48 business hours. Programmer, systems Notified)

## 2024-01-13 NOTE — Unmapped (Signed)
 Copied from CRM #[id number]. Topic: Access To Clinicians - Req Clinic Call Back  >> Jan 13, 2024  2:00 PM April T wrote:  Patient is returning a call to the nurse regarding pain medication

## 2024-01-13 NOTE — Unmapped (Signed)
 What is going on with liver transplant? She generally is so sleepy that this is not something I would want to increase. I could refer her to a shoulder specialist regarding this.

## 2024-01-13 NOTE — Unmapped (Signed)
 Our Managed Care Team informed that a Single Case Agreement has been signed by both [facility] and [facility] for liver living donors. S[facility] is in place.  Living donor is ok to move forward.

## 2024-01-13 NOTE — Unmapped (Signed)
 Spoke with patient;    She would like to be referred to a Specialist for her shoulder.    The Donor for her transplant back out and her team is in the process of evaluating a new donor.

## 2024-01-14 DIAGNOSIS — E1169 Type 2 diabetes mellitus with other specified complication: Principal | ICD-10-CM

## 2024-01-14 NOTE — Unmapped (Signed)
 Addended by: Treva Huyett on: 01/14/2024 11:42 AM     Modules accepted: Orders

## 2024-01-14 NOTE — Unmapped (Signed)
 done

## 2024-01-25 NOTE — Unmapped (Signed)
 BRIEF PSYCHOSOCIAL FOLLOW UP:    Present: Patient and DIL    TSW intention was to touch base with patient during pre surgery clinic appointment prior to scheduled LDLT.   Howeve,r Dr. Meade and Dr. Kapoor were in process of informing patient that they were not going to be able to surgical proceed due to an increase in patient's body habitus.     This was extremely devastating news for the patient and her DIL to receive. There questions were answered and Transplant Surgeons explained that the surgical risk was too high too proceed.     CSW stayed in the clinic room to offer empathy and allow both to process emotionally for a bit prior to exiting.   Both expressed appreciation for CSW and Transplant Team.     Bruno Mania, LCSW  Transplant Case Manager  01/25/2024. 4:48 PM

## 2024-02-06 ENCOUNTER — Inpatient Hospital Stay: Admit: 2024-02-06 | Discharge: 2024-02-07 | Payer: Medicaid (Managed Care)

## 2024-02-06 ENCOUNTER — Ambulatory Visit: Admit: 2024-02-06 | Discharge: 2024-02-07 | Payer: Medicaid (Managed Care)

## 2024-02-06 DIAGNOSIS — M25511 Pain in right shoulder: Principal | ICD-10-CM

## 2024-02-06 MED ADMIN — triamcinolone acetonide (KENALOG-40) injection 40 mg: 40 mg | INTRA_ARTICULAR | @ 20:00:00 | Stop: 2024-02-06

## 2024-02-06 NOTE — Unmapped (Signed)
 Thank you for choosing [facility]!  We appreciate the opportunity to participate in your care. Please let us know if we can be of assistance your orthopaedic issues in the future.      If you have questions or concerns, please do not hesitate to contact us by [facility] or by calling [phone] to speak with one of our clinical support sports team members.      We aim to provide you with the highest quality, individualized care.  If you have any unanswered questions after the visit, please do not hesitate to reach out to [place] on MyChart or leave a message for the nurse.     [place] MyChart Website: [email or web address]  ?  MyChart messages: These messages can be sent to your provider and will be checked by their clinical support staff.? The messages are checked throughout the day during normal business hours from 8:30 am-4:00 pm Monday-Friday, however responses may take up to 48 hours.? Please use this method of communication for non-urgent and non-emergent concerns, questions, refill requests or inquiries only.? ?Our team will help respond to all of your questions.? Please note that you may be asked to see a provider by either a telehealth or in person visit if it is deemed your questions are best handled in the clinic setting in person.??  ?  Please keep in mind, these messages are not real time communications, so be patient when waiting for a response.     If you do not have access to MyChart, do not know how to use MyChart or have an issue that may require more extensive discussion, please call the nurses' call line: [phone].? This line is checked throughout the day and will be responded to as time allows.? Please note that return calls could take up to 48 hours, depending on the nature of the need.?  ?  If you have an issue that requires emergent attention that cannot wait; either call the Orthopaedics resident on call at [phone], consider coming to our [facility] walk-in clinic, or go to the nearest Emergency Department.     If you need to schedule future appointments, please call [phone].      We look forward to seeing you again in the future and appreciate you choosing [facility] for your care!     Thank you,  Sheran Luz, PA            [facility]:  Monday - Friday 8 am - 4 pm  [address]   [place], [place]  [address]

## 2024-02-06 NOTE — Unmapped (Signed)
 Encounter Provider: Charmaine Cy Kief, PA  Date of Service: 02/06/2024    Primary Care Physician: Waylan Leita Linsey, MD    ASSESSMENT:  Denise York is a 58 y.o. female with the following visit diagnoses:    ICD-10-CM   1. Chronic right shoulder pain  M25.511    G89.29     PLAN:    - Home exercise program provided today   - Ice affected area for 15-20 minutes every hour as needed  - Patient will use OTC medication as needed for swelling/pain control  - Patient consented to a right subacromial corticosteroid injection today as described below.  - Follow-up with me as needed if symptoms fail to improve or worsen with conservative measures   - All relevant prior imaging was independently interpreted and reviewed by me prior to today's exam.  - I reviewed the diagnosis and discussed treatment options with the patient. The patient is amenable to the above plan. The patient was instructed to call or return to clinic if symptoms fail to improve as expected, there is any increasing pain, or any other concerns.    Requested Prescriptions      No prescriptions requested or ordered in this encounter        SUBJECTIVE:  Chief complaint: Right shoulder pain     History of Present Illness:   Denise York is a 58 y.o. female with PMH as documented below who presents for evaluation of right shoulder pain. Patient is right-hand dominant. She reports onset of symptoms approximately six months ago. She denies any known injury or inciting event. Pain is located diffusely throughout the shoulder and is worse with activity. She takes OTC medication as needed for pain. She notes that she is awaiting liver transplant. No prior right shoulder surgeries.   Pain Assessment: 0-10  0-10 Pain Scale: 8    Review of Systems Pertinent positives and negatives are documented in the HPI. All other systems reviewed are negative.   Medical History Past Medical History[1]   Surgical History Past Surgical History[2]   Allergies Shellfish containing products, Glipizide , and Lisinopril    Medications She has a current medication list which includes the following prescription(s): atorvastatin , glucose blood, blood-glucose meter, bumetanide , cholecalciferol (vitamin d3 25 mcg (1,000 units)), daily-vite (with folic acid), dextromethorphan  hbr, escitalopram  oxalate, aerochamber mv, lactulose , lancets, loratadine , metformin , multivitamin, naltrexone , omeprazole , ondansetron , ozempic , rifaximin , ozempic , and spironolactone .   Family History Her family history includes Alcohol abuse in her maternal uncle; Allergies in her father and another family member; Birth defects in her son and son; COPD in her mother; Coronary artery disease in an other family member; Diabetes in her father; Gout in her father; Heart disease in her father and mother; Hypertension in her mother; Kidney disease in her father; Kidney failure in an other family member; No Known Problems in her daughter, maternal grandfather, maternal grandmother, paternal grandfather, paternal grandmother, and sister.   Social History She reports that she has never smoked. She has been exposed to tobacco smoke. She has never used smokeless tobacco. She reports that she does not drink alcohol and does not use drugs.Home [address]  [place] [address]        Occupational History    Not on file     Social History[3]     OBJECTIVE:    DETAILED PHYSICAL EXAM (12 Point)  General Appearance well-nourished, in no acute distress.   Mood and Affect alert, cooperative and pleasant.   Pulmonary No labored  breathing or shortness of breath   Cardiovascular well-perfused distally and no edema.   Lymphatics No lymphadenopathy   Sensation sensation to light touch distally: Normal   MUSCULOSKELETAL    Right Shoulder  Inspection/palpation  Range of motion  Stability  Strength  Skin Inspection: No swelling, erythema, or deformity  Palpation: Diffuse tenderness to palpation of shoulder  ROM: FE 120 AROM/150 PROM, ER 30, IR to back pocket  Strength: 5/5 supraspinatus, 5/5 infraspinatus   Special Tests:   Positive Neer's test  Positive Speed's test  Negative Belly Press  Skin:  Warm, dry, and intact     Test Results    Imaging:  Radiology studies were ordered and personally reviewed today which reveal no acute fracture or dislocation. Acromioclavicular and glenohumeral joint spaces are mildly narrowed. Small calcified bodies over the greater tuberosity. Additional calcified body inferior to the glenoid, may represent chronic injury or tendinopathy.    Procedure  Injection: After discussing the risks and benefits of corticosteroid injection, informed consent was obtained and a timeout was performed. The patient's right shoulder was prepped using chloraprep. Topical anesthesia was obtained using ethyl chloride spray, followed by injection using 1ml of 40 mg/mL of Kenalog  and 4ml of 1% lidocaine  into the subacromial space. The patient tolerated the injection well and understands what to expect over the next several days.     MEDICAL DECISION MAKING (level of service defined by 2/3 elements)     Number/Complexity of Problems Addressed 1 acute, uncomplicated illness or injury (99203/99213)   Amount/Complexity of Data to be Reviewed/Analyzed 2 points: Review prior notes (1 point per unique source); Review test results (1 point per unique test); Order tests (1 point per unique test) (99203/99213)   Risk of Complications/Morbidity/Mortality of Management Over-the-counter Medications (99203/99213)          [1]   Past Medical History:  Diagnosis Date    Back pain     CHF (congestive heart failure)        Depressed     Diabetes mellitus        Fatigue     Infectious viral hepatitis     Joint pain     Neoplasm of right breast, 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    Obesity     Prediabetes    [2]   Past Surgical History:  Procedure Laterality Date    ABDOMINAL WALL MESH  REMOVAL  1997, 2005    Placement, 1997, removal and replacement 2005    BLADDER SURGERY      bladder tact X2    BREAST BIOPSY Right 04/2013    benign    BREAST EXCISIONAL BIOPSY Right 05/2013    benign    BREAST SURGERY Right     precancerous spot removed    CHOLECYSTECTOMY      HYSTERECTOMY      IR EMBOLIZATION VENOUS OTHER THAN HEMORRHAGE  02/17/2023    IR EMBOLIZATION VENOUS OTHER THAN HEMORRHAGE 02/17/2023 Babara Lash, MD IMG VIR [facility]    PR COLONOSCOPY FLX DX W/COLLJ SPEC WHEN PFRMD N/A 08/15/2023    Procedure: COLONOSCOPY, FLEXIBLE, PROXIMAL TO SPLENIC FLEXURE; DIAGNOSTIC, W/WO COLLECTION SPECIMEN BY BRUSH OR WASH;  Surgeon: Charlanne Kipper, MD;  Location: [facility] [place];  Service: Gastrointestinal    PR COLSC FLX W/RMVL OF TUMOR POLYP LESION SNARE TQ N/A 10/14/2016    Procedure: COLONOSCOPY FLEX; W/REMOV TUMOR/LES BY SNARE;  Surgeon: Eleanor Dewey Sorrel, MD;  Location: [facility] [place];  Service: Gastroenterology    PR COLSC FLX W/RMVL OF TUMOR POLYP LESION SNARE TQ N/A 01/15/2023    Procedure: COLONOSCOPY FLEX; W/REMOV TUMOR/LES BY SNARE;  Surgeon: Fae Alto, MD;  Location: [facility];  Service: Gastroenterology    PR GI IMAG INTRALUMINAL ESOPHAGUS-ILEUM W/I&R N/A 08/15/2023    Procedure: GI VIDEO TRACT IMAGE INTRALUMINAL (EG, CAPSULE ENDOSCOPY), ESOPHAGUS VIA ILEUM, PHYSICIAN INTERPRETATION & REPORT;  Surgeon: Charlanne Kipper, MD;  Location: [facility];  Service: Gastrointestinal    PR PERC CLOS,CONG INTERATRIAL COMMUN W/IMPL N/A 09/12/2022    Procedure: ASD closure;  Surgeon: Pia Sharper, MD;  Location: [facility] CATH;  Service: Cardiology    PR UPPER GI ENDOSCOPY,BIOPSY N/A 01/15/2023    Procedure: UGI ENDOSCOPY; WITH BIOPSY, SINGLE OR MULTIPLE;  Surgeon: Fae Alto, MD;  [facility]: [facility];  Service: Gastroenterology    PR UPPER GI ENDOSCOPY,DIAGNOSIS N/A 08/14/2023    Procedure: UGI ENDO, INCLUDE ESOPHAGUS, STOMACH, & DUODENUM &/OR JEJUNUM; DX W/WO COLLECTION SPECIMN, BY BRUSH OR WASH;  Surgeon: Charlanne Kipper, MD;  Location: [facility];  Service: Gastrointestinal    SKIN BIOPSY     [3]   Social History  Socioeconomic History    Marital status: Married     Spouse name: None    Number of children: None    Years of education: None    Highest education level: None   Tobacco Use    Smoking status: Never     Passive exposure: Past    Smokeless tobacco: Never   Vaping Use    Vaping status: Never Used   Substance and Sexual Activity    Alcohol use: No    Drug use: No    Sexual activity: Not Currently     Birth control/protection: Abstinence, None   Other Topics Concern    Do you use sunscreen? Yes    Excessive sun exposure? Yes   Social History Narrative    2 births. No living children. Married new husband 2014.         02/21/2017        [facility] Components:        Family, social, cultural characteristics: social support includes best friend .      Patient has the following communication needs: none, per patient     Health Literacy: How confident are you that you understand your health issues/concerns, can participate in your care, and manage your care along with your physician: confident.    Behaviors Affecting Health: none, per patient    Family history of mental health illness and/or substance abuse: asked patient/parent and none disclosed.    Have you been seen by any medical provider that we have not referred you to since your last visit ? No    Discussed a Living Will with the patient and uyz:Ejupzwu is under the age of 79.      Social Drivers of Health     Food Insecurity: No Food Insecurity (11/27/2023)    Hunger Vital Sign     Worried About Running Out of Food in the Last Year: Never true     Ran Out of Food in the Last Year: Never true   Transportation Needs: No Transportation Needs (11/27/2023)    PRAPARE - Therapist, art (Medical): No     Lack of Transportation (Non-Medical): No   Housing: Low Risk (11/27/2023)    Housing  Within the past 12 months, have you ever stayed: outside, in a car, in a tent, in an overnight shelter, or temporarily in someone else's home (i.e. couch-surfing)?: No     Are you worried about losing your housing?: No

## 2024-02-09 ENCOUNTER — Ambulatory Visit: Admit: 2024-02-09 | Discharge: 2024-02-10 | Payer: Medicaid (Managed Care)

## 2024-02-09 DIAGNOSIS — E889 Metabolic disorder, unspecified: Principal | ICD-10-CM

## 2024-02-09 DIAGNOSIS — K7682 Hepatic encephalopathy: Principal | ICD-10-CM

## 2024-02-09 DIAGNOSIS — R251 Tremor, unspecified: Principal | ICD-10-CM

## 2024-02-09 DIAGNOSIS — D696 Thrombocytopenia, unspecified: Principal | ICD-10-CM

## 2024-02-09 NOTE — Unmapped (Signed)
 [facility] Neurology Clinic Summary       [facility]  [facility]UROLOGY [facility]INIC [address] [place]  [address]  [place] [address]  [phone]    Date: 02/09/2024  Patient Name: Denise York  MRN: [id number]  PCP: Waylan Leita Sharyne Minnie Donald KAT[place]E, PA            Denise York is a 58 y.o. right handed female seen in consultation at the [facility] Neu[facility] Outpatient Clinics at the request of Dr. Minnie for evaluation.          Subjective:   Subjective            HPI: Patient is a 58 y.o. female seen at the request of Minnie Donald KATHEE, [place].      History of Present Illness  Denise York is a 58 year old female with hepatic encephalopathy who presents with cognitive issues.    She has a history of hepatic encephalopathy, decompensated cirrhosis, and portal hypertension. She was seen in the ER on August 13, 2023, for a gastrointestinal bleed and melena with decreased hemoglobin. Her family noted increased confusion, which has been a recurring issue during episodes of hepatic encephalopathy. Her mental status has improved since the hospital visit, although she experiences episodes of confusion that 'come and go'.    Her confusion worsens with poor sleep, as she is awake several times per night. She experiences tremors that also 'come and go'. No trouble walking, falls, or seizures. She has occasional headaches but no fevers or chills. She denies finding herself on the floor or in places without knowing how she got there.    Her diet includes as much protein as she can manage, but she is not following a diabetic diet. She reports currently taking lactulose  three times a day. She is in the process of evaluation for a liver transplant, with her sister as a potential donor.    Family history is significant for dementia in her uncle and grandmother, both on the same side of the family.                  Past Medical Hx, Family Hx, Social Hx, and Problem List has been reviewed in the [employer].        REVIEW OF SYSTEMS:  ROS        Objective:       Physical Exam:  Blood pressure 108/68, pulse 77, temperature 36.9 ??C (98.4 ??F), temperature source Temporal, height 152.4 cm (5'), weight 90.7 kg (200 lb), not currently breastfeeding.       Physical Exam          Neurological Exam      Physical Exam  GENERAL: Awake, alert, oriented to time, place, and person.  MENTAL STATUS: Normal speech, slow reaction to questions.  CRANIAL NERVES: Extraocular movements intact, no scleral icterus, no facial sensory deficit or asymmetry, no tongue or palate deviation.  MOTOR: Neck flexion and extension 5/5, trapezius 5/5 bilaterally, deltoids 5/5 bilaterally, triceps 5/5 bilaterally, biceps 5/5 bilaterally, finger extensors 5/5 bilaterally, finger flexors 5/5 bilaterally, knee flexion 5/5 bilaterally, knee extension 5/5 bilaterally, ankle dorsiflexion 5/5 bilaterally.  REFLEXES: Biceps, triceps, brachioradialis reflexes 1+ bilaterally, knee reflexes 0 bilaterally, ankle reflexes 0 bilaterally.  SENSATION: Light touch normal on all extremities.  COORDINATION: Finger-to-nose test performed, athetosis noted on left hand, mild asterixis present.  GAIT: Gait normal.  Assessment & Plan:   Assessment     Denise York is a 58 y.o. right handed female seen in consultation for the following problems.     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 Currently managed with lactulose . Liver transplantation is in process with her sister as a donor, expected to improve mental status post-transplant.  - Continue lactulose  three times a day  - Follow up with primary care physician and liver specialists    Tremor and mild asterixis  Intermittent tremors and mild asterixis consistent with hepatic encephalopathy. Symptoms may be exacerbated by poor sleep.    Thrombocytopenia and anemia  Thrombocytopenia with platelet count of 76 and anemia with hemoglobin at 10.8, likely related to underlying liver disease and portal hypertension.          Thank you very much for this consultation. Please let us  know if any questions or concerns.     Sincerely,  Sula LITTIE Morrie Terra, MD     CC: Minnie Donald NOVAK, [place]

## 2024-02-13 NOTE — Unmapped (Signed)
 Spoke to patient to confirm Monday 8/11 appt with Dr. Suellyn.  She reports she is doing well and has been taking her ozempic .

## 2024-02-16 ENCOUNTER — Ambulatory Visit: Admit: 2024-02-16 | Discharge: 2024-02-17 | Payer: PRIVATE HEALTH INSURANCE

## 2024-02-16 LAB — PROTIME-INR
INR: 1.35
PROTIME: 15.4 s — ABNORMAL HIGH (ref 9.9–12.6)

## 2024-02-16 LAB — CBC
HEMATOCRIT: 29.9 % — ABNORMAL LOW (ref 34.0–44.0)
HEMOGLOBIN: 10.2 g/dL — ABNORMAL LOW (ref 11.3–14.9)
MEAN CORPUSCULAR HEMOGLOBIN CONC: 34.1 g/dL (ref 32.0–36.0)
MEAN CORPUSCULAR HEMOGLOBIN: 28.7 pg (ref 25.9–32.4)
MEAN CORPUSCULAR VOLUME: 84 fL (ref 77.6–95.7)
MEAN PLATELET VOLUME: 10.7 fL (ref 6.8–10.7)
PLATELET COUNT: 76 10*9/L — ABNORMAL LOW (ref 150–450)
RED BLOOD CELL COUNT: 3.56 10*12/L — ABNORMAL LOW (ref 3.95–5.13)
RED CELL DISTRIBUTION WIDTH: 15.1 % (ref 12.2–15.2)
WBC ADJUSTED: 10.8 10*9/L (ref 3.6–11.2)

## 2024-02-16 LAB — COMPREHENSIVE METABOLIC PANEL
ALBUMIN: 3 g/dL — ABNORMAL LOW (ref 3.4–5.0)
ALKALINE PHOSPHATASE: 97 U/L (ref 46–116)
ALT (SGPT): 76 U/L — ABNORMAL HIGH (ref 10–49)
ANION GAP: 15 mmol/L — ABNORMAL HIGH (ref 5–14)
AST (SGOT): 58 U/L — ABNORMAL HIGH (ref <=34)
BILIRUBIN TOTAL: 2.6 mg/dL — ABNORMAL HIGH (ref 0.3–1.2)
BLOOD UREA NITROGEN: 33 mg/dL — ABNORMAL HIGH (ref 9–23)
BUN / CREAT RATIO: 21
CALCIUM: 10.4 mg/dL (ref 8.7–10.4)
CHLORIDE: 96 mmol/L — ABNORMAL LOW (ref 98–107)
CO2: 21 mmol/L (ref 20.0–31.0)
CREATININE: 1.55 mg/dL — ABNORMAL HIGH (ref 0.55–1.02)
EGFR CKD-EPI (2021) FEMALE: 39 mL/min/1.73m2 — ABNORMAL LOW (ref >=60)
GLUCOSE RANDOM: 315 mg/dL — ABNORMAL HIGH (ref 70–179)
POTASSIUM: 4.6 mmol/L (ref 3.5–5.1)
PROTEIN TOTAL: 6.8 g/dL (ref 5.7–8.2)
SODIUM: 132 mmol/L — ABNORMAL LOW (ref 135–145)

## 2024-02-16 LAB — AFP TUMOR MARKER: AFP-TUMOR MARKER: 5 ng/mL (ref <=8)

## 2024-02-16 LAB — IRON & TIBC
IRON SATURATION: 48 % (ref 20–55)
IRON: 208 ug/dL — ABNORMAL HIGH (ref 50–170)
TOTAL IRON BINDING CAPACITY: 430 ug/dL — ABNORMAL HIGH (ref 250–425)

## 2024-02-16 LAB — FERRITIN: FERRITIN: 8.9 ng/mL (ref 7.3–270.7)

## 2024-02-16 NOTE — Unmapped (Addendum)
 [facility] LIVER CENTER  [facility] Transplant Clinic  Donald KATHEE Matter, [place]   [facility]  [address]  [place], [place] [address]  Main Clinic: [phone]  Appointment Schedulers: [phone]  Fax: [phone]       Thank you for allowing me to participate in your medical care today. Here are my recommendations based on today's visit:    Decrease Bumex  to 1 mg daily and decrease spironolactone  to 100 mg daily.  Repeat labs in 1 week.  For sleep, try Benadryl (diphenhydramine) 25 mg nightly. Can increase to 2 tablets (50 mg total) if needed. You can also try melatonin or magnesium  glycinate.  Adjust lactulose  dose or frequency to produce a target of 3-4 soft bowel movements a day. Continue Xifaxan  twice daily.  You can add Miralax as needed for constipation and try [place] Magnesium  capsules, 2 nightly, for constipation.  You should not drive at all due to the increased risk of traffic accidents.  Continue carvedilol  to 6.25 mg twice daily.  Eat a high protein diet (1.2-1.5 grams per kg body weight per day, typically about 100 grams of protein a day). Eat small, frequent meals, high-protein late-night snacks, and use protein supplements such as Glucerna.  Avoid NSAIDs (ibuprofen, naproxen, Advil, Motrin, Aleve, Naprosyn, etc). Acetaminophen  (Tylenol ) up to 2000 mg a day is ok.  This fall, recommend COVID-19 vaccine and influenza vaccines.    Take care,  Donald KATHEE Matter, PA

## 2024-02-16 NOTE — Unmapped (Signed)
 [facility]  02/16/2024    Reason for visit: Follow up of Decompensated cirrhosis   [K72.90, K74.60]    Assessment/Plan:    58 year old female presenting for follow up of decompensated MASH cirrhosis.  Her cirrhosis has been complicated by hepatic encephalopathy, clinically significant portal hypertension, small-volume ascites, edema, and non clinically significant HPS. Ascites has responded well to diuretics, and she has not required a therapeutic paracentesis. Continues to have intermittent HE despite splenorenal shunt embolization and lactulose /Xifaxan . No history of jaundice of esophageal variceal bleeding. Her metabolic risk factors include obesity, type 2 diabetes mellitus, hypertension, and dyslipidemia. She resumed Ozempic  with significant weight loss, now 90.2 kg. We will re-discuss her in selection committee.    Decompensated cirrhosis  MELD 23.  Now < 95 kg so should be able to proceed with LDLT.  We will re-discuss her in selection committee.  Increase protein intake, goal 100 g/day. Discussed using protein supplements such as Glucerna.  Avoid NSAIDs.  Tylenol  up to 2 g/day is okay.    Portal hypertension  Secondary esophageal varices without bleeding   Continue carvedilol  to 6.25 mg twice daily.  She will not require EGDs for esophageal variceal surveillance if she remains on carvedilol .    Other ascites, lower extremity edema  Creatinine elevated to 1.55. Decrease Bumex  to 1 mg daily and decrease spironolactone  to 100 mg daily.  Repeat BMP 1 week.  Limit sodium to 2000 mg daily.    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  Continue lactulose , titrated to produce 3-5 soft bowel movements per day.   Continue Xifaxan  twice daily.  She was advised not to drive at all due to risk of traffic accidents.    Insomnia  Likely related to HE.  Try Benadryl (diphenhydramine) 25 mg nightly. Can increase to 2 tablets (50 mg total) if needed.  Can also try melatonin or magnesium  glycinate.     IDA  No melena or hematochezia but history of bleeding small bowel AVMs on capsule endoscopy. Consider enteroscopy.    Pruritus  Resolved on naltrexone  50 mg daily.     Constipation  Increase lactulose  to produce 3-5 soft bowel movements daily.  Add Miralax as needed for constipation.  Try [place] Magnesium  capsules, 2 nightly, for constipation.    Return in about 3 months (around 05/18/2024).    Subjective   History of Present Illness   Unaccompanied today    58 y.o. 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 Her cirrhosis has been complicated by clinically significant portal hypertension, ascites, and hepatic encephalopathy. EGD 01/15/23, G1 EV and non bleeding gastric ulcers (H. Pylori negative). 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 Admitted 2/5-08/17/23 with melena and hepatic encephalopathy. EGD and colonoscopy without evidence of bleeding. Capsule endoscopy with multiple angioectasias with stigmata of recent bleeding in the small bowel and a single angioectasia with active bleeding in the small bowel. Anemia subsequently improved.    Interval History  Last visit 12/29/2023. Denies interim hospitalizations. Denies abdominal pain or distention, jaundice, fever, lower extremity edema, melena, or hematochezia. Saw neurology and they reported her symptoms are most consistent with hepatic encephalopathy. Feels tired and notes difficulty sleeping, takes cat naps. Has nausea today, took Zofran . Resumed Ozempic  in June after last visit, now 198 lbs (221 at last visit).  Objective   Physical Exam   Vital Signs: BP 103/60 (BP Site: L Arm, BP Position: Sitting, BP Cuff Size: Large)  - Pulse 92  - Temp 36.1 ??C (97 ??F) (Tympanic)  - Ht 162.6 cm (5' 4)  - Wt 90.2 kg (198 lb 12.8 oz)  - SpO2 96%  - BMI 34.12 kg/m??   Constitutional: She is in no apparent distress  Eyes: Anicteric sclerae  Cardiovascular: Trace peripheral edema bilaterally  Gastrointestinal: Soft, non-tender, non-distended abdomen; splenomegaly   Neurologic: Awake, alert, and oriented to person, place, and time with normal speech; asterixis present    Results for orders placed or performed in visit on 02/16/24   AFP tumor marker   Result Value Ref Range    AFP-Tumor Marker 5 <=8 ng/mL   CBC   Result Value Ref Range    WBC 10.8 3.6 - 11.2 10*9/L    RBC 3.56 (L) 3.95 - 5.13 10*12/L    HGB 10.2 (L) 11.3 - 14.9 g/dL    HCT 70.0 (L) 65.9 - 44.0 %    MCV 84.0 77.6 - 95.7 fL    MCH 28.7 25.9 - 32.4 pg    MCHC 34.1 32.0 - 36.0 g/dL    RDW 84.8 87.7 - 84.7 %    MPV 10.7 6.8 - 10.7 fL    Platelet 76 (L) 150 - 450 10*9/L   PT-INR   Result Value Ref Range    PT 15.4 (H) 9.9 - 12.6 sec    INR 1.35    Comprehensive Metabolic Panel   Result Value Ref Range    Sodium 132 (L) 135 - 145 mmol/L    Potassium 4.6 3.5 - 5.1 mmol/L    Chloride 96 (L) 98 - 107 mmol/L    CO2 21.0 20.0 - 31.0 mmol/L    Anion Gap 15 (H) 5 - 14 mmol/L    BUN 33 (H) 9 - 23 mg/dL    Creatinine 8.44 (H) 0.55 - 1.02 mg/dL    BUN/Creatinine Ratio 21     eGFR CKD-EPI (2021) Female 39 (L) >=60 mL/min/1.7m2    Glucose 315 (H) 70 - 179 mg/dL    Calcium 89.5 8.7 - 89.5 mg/dL    Albumin 3.0 (L) 3.4 - 5.0 g/dL    Total Protein 6.8 5.7 - 8.2 g/dL    Total Bilirubin 2.6 (H) 0.3 - 1.2 mg/dL    AST 58 (H) <=65 U/L    ALT 76 (H) 10 - 49 U/L    Alkaline Phosphatase 97 46 - 116 U/L   Ferritin   Result Value Ref Range    Ferritin 8.9 7.3 - 270.7 ng/mL   Iron & TIBC   Result Value Ref Range    Iron 208 (H) 50 - 170 ug/dL    TIBC 569 (H) 749 - 574 ug/dL    Iron Saturation (%) 48 20 - 55 %      MELD 3.0: 23 at 02/16/2024  3:30 PM  Calculated from:  Serum Creatinine: 1.55 mg/dL at 1/88/7974  6:69 PM  Serum Sodium: 132 mmol/L at 02/16/2024  3:30 PM  Total Bilirubin: 2.6 mg/dL at 1/88/7974  6:69 PM  Serum Albumin: 3 g/dL at 1/88/7974  6:69 PM  INR(ratio): 1.35 at 02/16/2024  3:30 PM  Age at listing (hypothetical): 57 years  Sex: Female at 02/16/2024  3:30 PM

## 2024-02-24 ENCOUNTER — Encounter: Admit: 2024-02-24 | Discharge: 2024-02-25 | Payer: PRIVATE HEALTH INSURANCE

## 2024-02-24 ENCOUNTER — Ambulatory Visit: Admit: 2024-02-24 | Discharge: 2024-02-25 | Payer: PRIVATE HEALTH INSURANCE

## 2024-02-24 DIAGNOSIS — K7290 Hepatic failure, unspecified without coma: Principal | ICD-10-CM

## 2024-02-24 DIAGNOSIS — K7460 Unspecified cirrhosis of liver: Principal | ICD-10-CM

## 2024-02-24 LAB — COMPREHENSIVE METABOLIC PANEL
ALBUMIN: 2.8 g/dL — ABNORMAL LOW (ref 3.4–5.0)
ALKALINE PHOSPHATASE: 101 U/L (ref 46–116)
ALT (SGPT): 96 U/L — ABNORMAL HIGH (ref 10–49)
ANION GAP: 14 mmol/L (ref 5–14)
AST (SGOT): 66 U/L — ABNORMAL HIGH (ref <=34)
BILIRUBIN TOTAL: 1.3 mg/dL — ABNORMAL HIGH (ref 0.3–1.2)
BLOOD UREA NITROGEN: 25 mg/dL — ABNORMAL HIGH (ref 9–23)
BUN / CREAT RATIO: 21
CALCIUM: 10.1 mg/dL (ref 8.7–10.4)
CHLORIDE: 98 mmol/L (ref 98–107)
CO2: 28.2 mmol/L (ref 20.0–31.0)
CREATININE: 1.2 mg/dL — ABNORMAL HIGH (ref 0.55–1.02)
EGFR CKD-EPI (2021) FEMALE: 53 mL/min/1.73m2 — ABNORMAL LOW (ref >=60)
GLUCOSE RANDOM: 214 mg/dL — ABNORMAL HIGH (ref 70–179)
POTASSIUM: 4.8 mmol/L (ref 3.5–5.1)
PROTEIN TOTAL: 6.2 g/dL (ref 5.7–8.2)
SODIUM: 140 mmol/L (ref 135–145)

## 2024-02-24 LAB — PROTIME-INR
INR: 1.35
PROTIME: 15.4 s — ABNORMAL HIGH (ref 9.9–12.6)

## 2024-02-24 NOTE — Unmapped (Signed)
 Cr improved to 1.20 from 1.55 with decrease in Bumex  to 1 mg daily and decrease in spironolactone  to 100 mg daily.

## 2024-02-25 NOTE — Unmapped (Signed)
 Cr improved with decrease in Bumex  and spironolactone .    MELD 3.0: 14 at 02/24/2024 11:29 AM  Calculated from:  Serum Creatinine: 1.2 mg/dL at 1/80/7974 88:70 AM  Serum Sodium: 140 mmol/L (Using max of 137 mmol/L) at 02/24/2024 11:29 AM  Total Bilirubin: 1.3 mg/dL at 1/80/7974 88:70 AM  Serum Albumin: 2.8 g/dL at 1/80/7974 88:70 AM  INR(ratio): 1.35 at 02/24/2024 11:29 AM  Age at listing (hypothetical): 57 years  Sex: Female at 02/24/2024 11:29 AM

## 2024-03-04 MED ORDER — SPIRONOLACTONE 100 MG TABLET
ORAL_TABLET | Freq: Every day | ORAL | 11 refills | 15.00000 days | Status: CP
Start: 2024-03-04 — End: ?

## 2024-03-04 MED ORDER — BUMETANIDE 1 MG TABLET
ORAL_TABLET | Freq: Every day | ORAL | 11 refills | 30.00000 days | Status: CP
Start: 2024-03-04 — End: ?

## 2024-03-04 NOTE — Unmapped (Signed)
 Pt request for RX Refill

## 2024-04-05 NOTE — Unmapped (Signed)
 Patient declined Go to ED Now disposition.  Advised patient their symptoms appear urgent and prompt ED evaluation is the safest course of action.  Patient verbalized understanding of the risks and opts to decline prompt ED evaluation against medical advice.     Upcoming Appt:  Future Appointments   Date Time Provider Department Center   06/22/2024  3:30 PM Suellyn Cena Camps, MD [facility]   12/17/2024 12:20 PM Jama Old, MD [facility]       Disposition:  Go to ED Now (or PCP Triage)    Encounter Reason for Disposition:    [1] SEVERE headache AND [2] not improved 2 hours after pain medicine/ice packs      Is this a pediatric patient?   No     Any recent, relevant visit?   Yes     Date/location of recent visit?  A few weeks ago-liver specialist    Any related medications?  Yes     Name of medication and dose?  unsure    Any relevant medical history?   No    Any interventions?   Yes     What has been done? (include related medication given, dose, and date/time)  Tylenol  04/05/24 @ 0800 1000 mg      [facility] Eligibility    Patient is eligible for [facility] (according to most recent zip code and insurance information)?  No    Patient is [facility] eligible and appropriate for referral: No     Encounter Initial Assessment:  1. MECHANISM: How did the injury happen? For falls, ask: What height did you fall from? and What surface did you fall against?       Going up concrete steps, got to second one and fell backwards, hit back right of head and right side of body  2. ONSET: When did the injury happen? (e.g., minutes, hours ago)       03/07/24, 4 weeks ago  3. NEUROLOGIC SYMPTOMS: Was there any loss of consciousness? Are there any other neurological symptoms?       Denies LOC, neurologic symptoms. Felt fine at the time.  4. MENTAL STATUS: Does the person know who they are, who you are, and where they are?       Mentally alert  5. LOCATION: What part of the head was hit?       Back right of head  6. SCALP APPEARANCE: What does the scalp look like? Is it bleeding now? If Yes, ask: Is it difficult to stop?       Still feels tender on back right head. Did have lump at first, now gone, never bled  7. SIZE: For cuts, bruises, or swelling, ask: How large is it? (e.g., inches or centimeters)       Had scrapes on arm that are now gone, bruise on right calf is still there.  8. PAIN: Is there any pain? If Yes, ask: How bad is it? (Scale 0-10; or none, mild, moderate, severe)      7-8/10 head pain  9. TETANUS: For any breaks in the skin, ask: When was your last tetanus booster?      2023  10. BLOOD THINNERS: Do you take any blood thinners? (e.g., aspirin , clopidogrel  / Plavix , coumadin, heparin ). Notes: Other strong blood thinners include: Arixtra (fondaparinux), Eliquis (apixaban), Pradaxa (dabigatran), and Xarelto (rivaroxaban).        Denies  11. OTHER SYMPTOMS: Do you have any other symptoms? (e.g., neck pain,  vomiting)        Denies neck pain, vomiting.   12. PREGNANCY: Is there any chance you are pregnant? When was your last menstrual period?        N/A age      Encounter Protocols Used:  Head Injury-A-AH

## 2024-04-05 NOTE — Unmapped (Signed)
 Phoned patient to ask why she did not go to the emergency room after nurse triage. Patient said,  I am just waiting for my ride to come. Patient told nurse she hit her head a month ago on the 31 of August. She told nurse,  the spot I hit on my head is tender and painful.  I say a 7. Nurse asked patient to go to ED now to get her CT scan of her head to be evaluated. Patient was told to call 911 and tell them she is in severe pain that is getting worse since she hit her head last month. Patient said,  I am waiting for a ride and then I will go, Leave me alone.

## 2024-04-05 NOTE — Unmapped (Addendum)
 FYI. Phoned patient to inquire on reason for not going to the emergency room. She did not answer. Left a message to call the office back.

## 2024-04-08 MED FILL — XIFAXAN 550 MG TABLET: ORAL | 90 days supply | Qty: 180 | Fill #2

## 2024-04-29 ENCOUNTER — Ambulatory Visit: Admit: 2024-04-29 | Discharge: 2024-04-29 | Payer: PRIVATE HEALTH INSURANCE

## 2024-04-29 DIAGNOSIS — K7460 Unspecified cirrhosis of liver: Principal | ICD-10-CM

## 2024-04-29 DIAGNOSIS — K7290 Hepatic failure, unspecified without coma: Principal | ICD-10-CM

## 2024-04-29 DIAGNOSIS — R1010 Upper abdominal pain, unspecified: Principal | ICD-10-CM

## 2024-04-29 LAB — COMPREHENSIVE METABOLIC PANEL
ALBUMIN: 2.8 g/dL — ABNORMAL LOW (ref 3.4–5.0)
ALKALINE PHOSPHATASE: 94 U/L (ref 46–116)
ALT (SGPT): 54 U/L — ABNORMAL HIGH (ref 10–49)
ANION GAP: 16 mmol/L — ABNORMAL HIGH (ref 5–14)
AST (SGOT): 67 U/L — ABNORMAL HIGH (ref <=34)
BILIRUBIN TOTAL: 2.6 mg/dL — ABNORMAL HIGH (ref 0.3–1.2)
BLOOD UREA NITROGEN: 19 mg/dL (ref 9–23)
BUN / CREAT RATIO: 15
CALCIUM: 9 mg/dL (ref 8.7–10.4)
CHLORIDE: 99 mmol/L (ref 98–107)
CO2: 25 mmol/L (ref 20.0–31.0)
CREATININE: 1.27 mg/dL — ABNORMAL HIGH (ref 0.55–1.02)
EGFR CKD-EPI (2021) FEMALE: 49 mL/min/1.73m2 — ABNORMAL LOW (ref >=60)
GLUCOSE RANDOM: 207 mg/dL — ABNORMAL HIGH (ref 70–179)
POTASSIUM: 3.7 mmol/L (ref 3.5–5.1)
PROTEIN TOTAL: 6.4 g/dL (ref 5.7–8.2)
SODIUM: 140 mmol/L (ref 135–145)

## 2024-04-29 LAB — AFP TUMOR MARKER: AFP-TUMOR MARKER: 3 ng/mL (ref <=8)

## 2024-04-29 LAB — CBC
HEMATOCRIT: 28.9 % — ABNORMAL LOW (ref 34.0–44.0)
HEMOGLOBIN: 9.1 g/dL — ABNORMAL LOW (ref 11.3–14.9)
MEAN CORPUSCULAR HEMOGLOBIN CONC: 31.6 g/dL — ABNORMAL LOW (ref 32.0–36.0)
MEAN CORPUSCULAR HEMOGLOBIN: 24.5 pg — ABNORMAL LOW (ref 25.9–32.4)
MEAN CORPUSCULAR VOLUME: 77.5 fL — ABNORMAL LOW (ref 77.6–95.7)
MEAN PLATELET VOLUME: 11.8 fL — ABNORMAL HIGH (ref 6.8–10.7)
PLATELET COUNT: 74 10*9/L — ABNORMAL LOW (ref 150–450)
RED BLOOD CELL COUNT: 3.73 10*12/L — ABNORMAL LOW (ref 3.95–5.13)
RED CELL DISTRIBUTION WIDTH: 16.7 % — ABNORMAL HIGH (ref 12.2–15.2)
WBC ADJUSTED: 6.8 10*9/L (ref 3.6–11.2)

## 2024-04-29 LAB — PROTIME-INR
INR: 1.37
PROTIME: 15.6 s — ABNORMAL HIGH (ref 9.9–12.6)

## 2024-04-29 LAB — IRON & TIBC
IRON SATURATION: 41 % (ref 20–55)
IRON: 161 ug/dL (ref 50–170)
TOTAL IRON BINDING CAPACITY: 395 ug/dL (ref 250–425)

## 2024-04-29 LAB — LIPASE: LIPASE: 59 U/L — ABNORMAL HIGH (ref 12–53)

## 2024-04-29 LAB — FERRITIN: FERRITIN: 7.8 ng/mL (ref 7.3–270.7)

## 2024-04-29 MED ORDER — LACTULOSE 20 GRAM ORAL PACKET
PACK | Freq: Three times a day (TID) | ORAL | 3 refills | 90.00000 days | Status: CP
Start: 2024-04-29 — End: 2025-04-29
  Filled 2024-05-05: qty 540, 90d supply, fill #0

## 2024-04-29 NOTE — Progress Notes (Signed)
 Per provider, the patient received the covid and flu vaccine.  Patient ID verified with name and date of birth.  All screening questions were answered.  Vaccine(s) were administered as ordered.  See immunization history for documentation.  Patient tolerated the injection(s) well with no issues noted.  Vaccine Information sheet given to the patient.

## 2024-04-29 NOTE — Unmapped (Addendum)
 [facility]  [facility]  Donald KATHEE Matter, [place]   [facility]  [address]  [place], [place] [address]  Main Clinic: [phone]  Appointment Schedulers: [phone]  Macky Ada, RN: [phone]  Fax: [phone]       Thank you for allowing me to participate in your medical care today. Here are my recommendations based on today's visit:    Please call [phone] option 1 to schedule your imaging study at [facility] (due in December).  Limit salt (sodium) intake to less than 2000 mg per day if you notice worsening swelling.  Eat a high protein diet (1.2-1.5 grams per kg body weight per day, typically about 100 grams of protein a day). Eat small, frequent meals, high-protein late-night snacks, and use protein supplements such as Ensure or Boost.  Adjust lactulose  dose or frequency to produce a target of 3-4 soft bowel movements a day. Continue Xifaxan  twice daily. You should not drive at all due to the increased risk of traffic accidents.  Avoid NSAIDs (ibuprofen, naproxen, Advil, Motrin, Aleve, Naprosyn, etc). Acetaminophen  (Tylenol ) up to 2000 mg a day is ok.  You received your influenza and COVID-19 vaccines today.    Take care,  Donald KATHEE Matter, PA

## 2024-04-29 NOTE — Progress Notes (Signed)
 [facility]  04/29/2024    Reason for visit: Follow up of Decompensated cirrhosis    (CMS-HCC) [K72.90, K74.60]    Assessment/Plan:    58 y.o. female presenting for follow up of decompensated MASH cirrhosis.  Her cirrhosis has been complicated by hepatic encephalopathy, clinically significant portal hypertension, small-volume ascites, edema, and non clinically significant HPS. Ascites has responded well to diuretics, and she has not required a therapeutic paracentesis. Continues to have HE despite splenorenal shunt embolization and lactulose /Xifaxan . No history of jaundice of esophageal variceal bleeding.     Her metabolic risk factors include obesity, type 2 diabetes mellitus, hypertension, and dyslipidemia. She resumed Ozempic  with significant weight loss, now 89.9 kg.  Approved for listing pending higher MELD or living donor. Notes her sister is not currently planning to proceed as a living donor and she has not identified other donors.    Decompensated cirrhosis  MELD 18; approved for listing pending higher MELD or living donor.  HCC surveillance every 6 months with ultrasound and AFP, next due 06/2024.  Now < 95 kg so OK to proceed with LDLT if she identifies a donor.   Increase protein intake, goal 100 g/day. Discussed using protein supplements such as Glucerna.  Avoid NSAIDs.  Tylenol  up to 2 g/day is okay.  Influenza and COVID-19 vaccines administered today     Portal hypertension  Secondary esophageal varices without bleeding   Continue carvedilol  6.25 mg twice daily.  She will not require EGDs for esophageal variceal surveillance if she remains on carvedilol .    Other ascites, lower extremity edema  Continue Bumex  1 mg daily and spironolactone  to 200 mg daily.  Limit sodium to 2000 mg daily.    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  Continue lactulose , titrated to produce 3-5 soft bowel movements per day.   Continue Xifaxan  twice daily.  She was advised not to drive at all due to risk of traffic accidents (she is not currently driving).    Insomnia  Likely related to HE.  Try Benadryl (diphenhydramine) 25 mg nightly. Can increase to 2 tablets (50 mg total) if needed.  Can also try melatonin.    Pruritus  Continue naltrexone  50 mg daily.   Stop Lexapro  and start sertraline 100 mg daily which will treat both anxiety and pruritus. OK to transition without taper, per PCP Dr. Waylan.    Unsteadiness, falls  Fall end of September, advised to go to the ED but ultimately did not go. Notes improvement in headache. Denies head CT today.  Declined PT.  Recommend she use a walker to ambulate. Continue management of HE as noted above.  Declined head CT.    Return in about 8 weeks (around 06/24/2024).    Subjective   History of Present Illness   Accompanied by: sister    58 y.o. 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 Her cirrhosis has been complicated by clinically significant portal hypertension, ascites, and hepatic encephalopathy. EGD 01/15/23, G1 EV and non bleeding gastric ulcers (H. Pylori negative). 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 Admitted 2/5-08/17/23 with melena and hepatic encephalopathy. EGD and colonoscopy without evidence of bleeding. Capsule endoscopy with multiple angioectasias with stigmata of recent bleeding in the small bowel and a single angioectasia with active bleeding in the small bowel. Anemia subsequently improved.  Interval History  Last visit 02/16/2024. Denies interim hospitalizations. Notes feeling unsteady on her feet, fell a few weeks ago, hit her head, had noticed headache/pain. Had been advised to go to the ED but ultimately did not go. Notes this has improved. Offered CT head, she declined.   Denies abdominal pain or distention, jaundice, fever, confusion, melena, or hematochezia. Edema maintained in 1 mg Bumex  and 200 mg spironolactone  daily. Notes worsening itching despite naltrexone . On Lexapro , reports she was previously on Zoloft with good effect on anxiety. Notes her sister is not currently planning to proceed with living donation and she has not identified other donors.    Objective   Physical Exam   Vital Signs: BP 123/50 (BP Site: L Arm, BP Position: Sitting, BP Cuff Size: Large)  - Pulse 89  - Temp 36.6 ??C (97.9 ??F) (Tympanic)  - Wt 89.9 kg (198 lb 1.6 oz)  - SpO2 100%  - BMI 34.00 kg/m??   Constitutional: She is in no apparent distress  Eyes: Anicteric sclerae  Cardiovascular: Trace peripheral edema bilaterally  Gastrointestinal: Soft, non-tender, non-distended abdomen; splenomegaly   Neurologic: Awake but lethargic with slowed speech; oriented to person, place, time; asterixis present    Results for orders placed or performed in visit on 04/29/24   CBC   Result Value Ref Range    WBC 6.8 3.6 - 11.2 10*9/L    RBC 3.73 (L) 3.95 - 5.13 10*12/L    HGB 9.1 (L) 11.3 - 14.9 g/dL    HCT 71.0 (L) 65.9 - 44.0 %    MCV 77.5 (L) 77.6 - 95.7 fL    MCH 24.5 (L) 25.9 - 32.4 pg    MCHC 31.6 (L) 32.0 - 36.0 g/dL    RDW 83.2 (H) 87.7 - 15.2 %    MPV 11.8 (H) 6.8 - 10.7 fL    Platelet 74 (L) 150 - 450 10*9/L   PT-INR   Result Value Ref Range    PT 15.6 (H) 9.9 - 12.6 sec    INR 1.37    AFP tumor marker   Result Value Ref Range    AFP-Tumor Marker 3 <=8 ng/mL   Comprehensive Metabolic Panel   Result Value Ref Range    Sodium 140 135 - 145 mmol/L    Potassium 3.7 3.5 - 5.1 mmol/L    Chloride 99 98 - 107 mmol/L    CO2 25.0 20.0 - 31.0 mmol/L    Anion Gap 16 (H) 5 - 14 mmol/L    BUN 19 9 - 23 mg/dL    Creatinine 8.72 (H) 0.55 - 1.02 mg/dL    BUN/Creatinine Ratio 15     eGFR CKD-EPI (2021) Female 49 (L) >=60 mL/min/1.49m2    Glucose 207 (H) 70 - 179 mg/dL    Calcium 9.0 8.7 - 89.5 mg/dL    Albumin 2.8 (L) 3.4 - 5.0 g/dL    Total Protein 6.4 5.7 - 8.2 g/dL    Total Bilirubin 2.6 (H) 0.3 - 1.2 mg/dL    AST 67 (H) <=65 U/L    ALT 54 (H) 10 - 49 U/L    Alkaline Phosphatase 94 46 - 116 U/L   Lipase Level   Result Value Ref Range    Lipase 59 (H) 12 - 53 U/L   Ferritin   Result Value Ref Range    Ferritin 7.8 7.3 - 270.7 ng/mL   Iron & TIBC   Result Value Ref Range    Iron 161 50 -  170 ug/dL    TIBC 604 749 - 574 ug/dL    Iron Saturation (%) 41 20 - 55 %        MELD 3.0: 18 at 04/29/2024  3:10 PM  Calculated from:  Serum Creatinine: 1.27 mg/dL at 89/76/7974  6:89 PM  Serum Sodium: 140 mmol/L (Using max of 137 mmol/L) at 04/29/2024  3:10 PM  Total Bilirubin: 2.6 mg/dL at 89/76/7974  6:89 PM  Serum Albumin: 2.8 g/dL at 89/76/7974  6:89 PM  INR(ratio): 1.37 at 04/29/2024  3:10 PM  Age at listing (hypothetical): 58 years  Sex: Female at 04/29/2024  3:10 PM     I personally spent 30 minutes face-to-face and non-face-to-face in the care of this patient, which includes all pre, intra, and post visit time on the date of service.

## 2024-05-03 LAB — VITAMIN D 25 HYDROXY: VITAMIN D, TOTAL (25OH): 49.9 ng/mL (ref 20.0–80.0)

## 2024-05-03 MED ORDER — SERTRALINE 100 MG TABLET
ORAL_TABLET | Freq: Every day | ORAL | 3 refills | 90.00000 days | Status: CP
Start: 2024-05-03 — End: 2024-05-03

## 2024-05-14 DIAGNOSIS — Z7682 Awaiting organ transplant status: Principal | ICD-10-CM

## 2024-05-26 DIAGNOSIS — K7581 Nonalcoholic steatohepatitis (NASH): Principal | ICD-10-CM

## 2024-05-26 DIAGNOSIS — K7469 Other cirrhosis of liver: Principal | ICD-10-CM

## 2024-05-26 MED ORDER — METFORMIN ER 500 MG TABLET,EXTENDED RELEASE 24 HR
ORAL_TABLET | ORAL | 1 refills | 0.00000 days | Status: CP
Start: 2024-05-26 — End: ?

## 2024-05-26 MED ORDER — LORATADINE 10 MG TABLET
ORAL_TABLET | Freq: Every day | ORAL | 1 refills | 90.00000 days | Status: CP
Start: 2024-05-26 — End: ?

## 2024-05-26 MED ORDER — ATORVASTATIN 20 MG TABLET
ORAL_TABLET | Freq: Every day | ORAL | 1 refills | 90.00000 days | Status: CP
Start: 2024-05-26 — End: ?

## 2024-05-26 MED ORDER — NALTREXONE 50 MG TABLET
ORAL_TABLET | Freq: Every day | ORAL | 11 refills | 0.00000 days
Start: 2024-05-26 — End: ?

## 2024-05-26 MED ORDER — OMEPRAZOLE 20 MG CAPSULE,DELAYED RELEASE
ORAL_CAPSULE | ORAL | 1 refills | 0.00000 days | Status: CP
Start: 2024-05-26 — End: ?

## 2024-05-26 NOTE — Telephone Encounter (Signed)
 Pt request for RX Refill

## 2024-05-31 MED ORDER — NALTREXONE 50 MG TABLET
ORAL_TABLET | Freq: Every day | ORAL | 11 refills | 30.00000 days | Status: CP
Start: 2024-05-31 — End: ?

## 2024-06-17 DIAGNOSIS — Z7682 Awaiting organ transplant status: Principal | ICD-10-CM

## 2024-06-21 NOTE — Telephone Encounter (Signed)
 Called patient in regards to getting her scheduled for her liver transplant evaluation/annual appointments. Was not able to speak to the patient and left her a voice message. In my message asked her to call me back about scheduling her for some appointments and left my contact number.

## 2024-06-22 ENCOUNTER — Ambulatory Visit: Admit: 2024-06-22 | Discharge: 2024-06-23 | Payer: PRIVATE HEALTH INSURANCE

## 2024-06-22 ENCOUNTER — Ambulatory Visit
Admit: 2024-06-22 | Discharge: 2024-06-23 | Payer: PRIVATE HEALTH INSURANCE | Attending: Transplant Hepatology | Primary: Transplant Hepatology

## 2024-06-22 LAB — COMPREHENSIVE METABOLIC PANEL
ALBUMIN: 2.6 g/dL — ABNORMAL LOW (ref 3.4–5.0)
ALKALINE PHOSPHATASE: 93 U/L (ref 46–116)
ALT (SGPT): 63 U/L — ABNORMAL HIGH (ref 10–49)
ANION GAP: 11 mmol/L (ref 5–14)
AST (SGOT): 70 U/L — ABNORMAL HIGH (ref <=34)
BILIRUBIN TOTAL: 2.6 mg/dL — ABNORMAL HIGH (ref 0.3–1.2)
BLOOD UREA NITROGEN: 25 mg/dL — ABNORMAL HIGH (ref 9–23)
BUN / CREAT RATIO: 19
CALCIUM: 9.2 mg/dL (ref 8.7–10.4)
CHLORIDE: 100 mmol/L (ref 98–107)
CO2: 28 mmol/L (ref 20.0–31.0)
CREATININE: 1.34 mg/dL — ABNORMAL HIGH (ref 0.55–1.02)
EGFR CKD-EPI (2021) FEMALE: 46 mL/min/1.73m2 — ABNORMAL LOW (ref >=60)
GLUCOSE RANDOM: 146 mg/dL (ref 70–179)
POTASSIUM: 4 mmol/L (ref 3.5–5.1)
PROTEIN TOTAL: 6.1 g/dL (ref 5.7–8.2)
SODIUM: 139 mmol/L (ref 135–145)

## 2024-06-22 LAB — PROTIME-INR
INR: 1.41
PROTIME: 16 s — ABNORMAL HIGH (ref 9.9–12.6)

## 2024-06-22 LAB — AFP TUMOR MARKER: AFP-TUMOR MARKER: 4 ng/mL (ref <=8)

## 2024-06-22 LAB — CBC
HEMATOCRIT: 20.4 % — ABNORMAL LOW (ref 34.0–44.0)
HEMOGLOBIN: 6.5 g/dL — ABNORMAL LOW (ref 11.3–14.9)
MEAN CORPUSCULAR HEMOGLOBIN CONC: 31.6 g/dL — ABNORMAL LOW (ref 32.0–36.0)
MEAN CORPUSCULAR HEMOGLOBIN: 22.1 pg — ABNORMAL LOW (ref 25.9–32.4)
MEAN CORPUSCULAR VOLUME: 70 fL — ABNORMAL LOW (ref 77.6–95.7)
MEAN PLATELET VOLUME: 11.3 fL — ABNORMAL HIGH (ref 6.8–10.7)
PLATELET COUNT: 73 10*9/L — ABNORMAL LOW (ref 150–450)
RED BLOOD CELL COUNT: 2.92 10*12/L — ABNORMAL LOW (ref 3.95–5.13)
RED CELL DISTRIBUTION WIDTH: 17.1 % — ABNORMAL HIGH (ref 12.2–15.2)
WBC ADJUSTED: 6.6 10*9/L (ref 3.6–11.2)

## 2024-06-22 NOTE — Progress Notes (Signed)
 [facility]  06/22/2024    Reason for visit: Follow up for Decompensated cirrhosis    (CMS-HCC) [K72.90, K74.60]    Assessment & Plan  58 y.o. female with decompensated MASH cirrhosis complicated by hepatic encephalopathy, ascites, nonclinically significant HPS, umbilical vein embolization 8//2024. MELD 19, approved for liver transplantation pending increase in MELD. No longer has a living donor option. Experiencing frequent falls, fatigue, nausea and vomiting.     Frequent falls  Stop carvedilol   Monitor for recurrent falls, BP, HR, and fatigue  Consider PT; she previously declined this intervention    Decompensated cirrhosis    (CMS-HCC) - Metabolic dysfunction-associated steatohepatitis (MASH)  Continue Ozempic  for diabetes mellitus and obesity  MRI scheduled 07/09/2024  Up to date on vaccinations    Portal hypertension    (CMS-HCC) - Secondary esophageal varices without bleeding (CMS-HCC)  Monitor off carvedilol   May need surveillance endoscopies if unable to resume carvedilol     Hepatic encephalopathy    (CMS-HCC)  Continue lactulose  and rifaximin     Other ascites/edema  Continue diuretics (bumetanide  1 mg daily, spironolactone  200 mg daily), unable to increase doses due to kidney dysfunction  Low sodium diet    Itching  Continue naltrexone     Nausea and vomiting, unspecified vomiting type  Likely exacerbated by Ozempic   She will request refill for Zofran  from her PCP  Encouraged small, frequent meals     Return in about 3 months (around 09/20/2024).    Subjective   Accompanied by: daughter    History of Present Illness  Denise York is a 58 year old female with decompensated cirrhosis here for follow up.    Interval history:   Last visit on 04/29/2024. She experiences frequent falls, attributing these episodes to tripping. No LOC. A recent fall on Sunday resulted in a head injury with a gash, and she fell again this morning while getting into the car, delaying her arrival to the appointment. She sometimes feels lightheaded or dizzy, particularly this morning before her fall. She is currently taking carvedilol  and diuretics.     She experiences nausea and vomiting, particularly after eating. This morning, after showering, she attempted to eat a sausage ball but vomited, bringing up food from the previous night, including whole chicken nuggets. Her appetite has been poor. She has previously used Zofran  for nausea. She is taking Ozempic .    She complains of back and bottom pain, though it is unclear if this is related to her falls. She also experiences fatigue, stating 'I'm always tired.' She reports lower extremity edema, particularly when standing, and she is limiting her intake of sodium in her diet.    Itching is resolved on naltrexone .    Objective   BP 109/47  - Pulse 85  - Temp 36.2 ??C (97.2 ??F) (Tympanic)  - Ht 162.6 cm (5' 4)  - Wt 89.3 kg (196 lb 14.4 oz)  - SpO2 100%  - BMI 33.80 kg/m??   Physical Exam  CONSTITUTIONAL: No apparent distress  EYES: Anicteric sclerae  CARDIOVASCULAR: Mild peripheral edema  GASTROINTESTINAL: Soft, nontender abdomen  NEUROLOGIC: Awake, alert, and oriented to person, place, and time with normal speech, mild asterixis     Results  06/22/2024: WBC 6.6, hemoglobin 6.5, hematocrit 20.4, platelets 73, INR 1.41, sodium 139, potassium 4.0, creatinine 1.34, albumin 2.6, total bilirubin 2.6, AST 70, ALT 63, alkaline phosphatase 93, AFP 4    MELD 19    US  12/22/2023:  Cirrhotic morphology of the liver with sequela  of portal hypertension including splenomegaly, small volume ascites and upper abdominal varices.        I personally spent 30 minutes face-to-face and non-face-to-face in the care of this patient, which includes all pre, intra, and post visit time on the date of service

## 2024-06-22 NOTE — Progress Notes (Deleted)
 [facility]  06/22/2024    Reason for visit: Follow up of No primary diagnosis found.    Assessment/Plan:    58 y.o. female presenting for follow up of decompensated MASH cirrhosis.  Her cirrhosis has been complicated by hepatic encephalopathy, clinically significant portal hypertension, small-volume ascites, edema, and non clinically significant HPS. Ascites has responded well to diuretics, and she has not required a therapeutic paracentesis. Continues to have HE despite splenorenal shunt embolization and lactulose /Xifaxan . No history of jaundice of esophageal variceal bleeding.     Her metabolic risk factors include obesity, type 2 diabetes mellitus, hypertension, and dyslipidemia. She resumed Ozempic  with significant weight loss, now 89.9 kg.  Approved for listing pending higher MELD or living donor. Notes her sister is not currently planning to proceed as a living donor and she has not identified other donors.    Decompensated cirrhosis  MELD 18; approved for listing pending higher MELD or living donor.  HCC surveillance every 6 months with ultrasound and AFP, next due 06/2024.  Now < 95 kg so OK to proceed with LDLT if she identifies a donor.   Increase protein intake, goal 100 g/day. Discussed using protein supplements such as Glucerna.  Avoid NSAIDs.  Tylenol  up to 2 g/day is okay.  Influenza and COVID-19 vaccines administered today     Portal hypertension  Secondary esophageal varices without bleeding   Continue carvedilol  6.25 mg twice daily.  She will not require EGDs for esophageal variceal surveillance if she remains on carvedilol .    Other ascites, lower extremity edema  Continue Bumex  1 mg daily and spironolactone  to 200 mg daily.  Limit sodium to 2000 mg daily.    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  Continue lactulose , titrated to produce 3-5 soft bowel movements per day.   Continue Xifaxan  twice daily.  She was advised not to drive at all due to risk of traffic accidents (she is not currently driving).    Insomnia  Likely related to HE.  Try Benadryl (diphenhydramine) 25 mg nightly. Can increase to 2 tablets (50 mg total) if needed.  Can also try melatonin.    Pruritus  Continue naltrexone  50 mg daily.   Stop Lexapro  and start sertraline  100 mg daily which will treat both anxiety and pruritus. OK to transition without taper, per PCP Dr. Waylan.    Unsteadiness, falls  Fall end of September, advised to go to the ED but ultimately did not go. Notes improvement in headache. Denies head CT today.  Declined PT.  Recommend she use a walker to ambulate. Continue management of HE as noted above.  Declined head CT.    No follow-ups on file.    Subjective   History of Present Illness   Accompanied by: sister    58 y.o. 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 Her cirrhosis has been complicated by clinically significant portal hypertension, ascites, and hepatic encephalopathy. EGD 01/15/23, G1 EV and non bleeding gastric ulcers (H. Pylori negative). 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 Admitted 2/5-08/17/23 with melena and hepatic encephalopathy. EGD and colonoscopy without evidence of bleeding. Capsule endoscopy with multiple angioectasias with stigmata of recent bleeding in the small bowel and a single angioectasia with active bleeding in the small bowel. Anemia subsequently improved.    Interval History  Last  visit 02/16/2024. Denies interim hospitalizations. Notes feeling unsteady on her feet, fell a few weeks ago, hit her head, had noticed headache/pain. Had been advised to go to the ED but ultimately did not go. Notes this has improved. Offered CT head, she declined.   Denies abdominal pain or distention, jaundice, fever, confusion, melena, or hematochezia. Edema maintained in 1 mg Bumex  and 200 mg spironolactone  daily. Notes worsening itching despite naltrexone . On Lexapro , reports she was previously on Zoloft  with good effect on anxiety. Notes her sister is not currently planning to proceed with living donation and she has not identified other donors.    Objective   Physical Exam   Vital Signs: BP 109/47  - Pulse 85  - Temp 36.2 ??C (97.2 ??F) (Tympanic)  - Ht 162.6 cm (5' 4)  - Wt 89.3 kg (196 lb 14.4 oz)  - SpO2 100%  - BMI 33.80 kg/m??   Constitutional: She is in no apparent distress  Eyes: Anicteric sclerae  Cardiovascular: Trace peripheral edema bilaterally  Gastrointestinal: Soft, non-tender, non-distended abdomen; splenomegaly   Neurologic: Awake but lethargic with slowed speech; oriented to person, place, time; asterixis present    No results found for any visits on 06/22/24.       MELD 3.0: 18 at 04/29/2024  3:10 PM  Calculated from:  Serum Creatinine: 1.27 mg/dL at 89/76/7974  6:89 PM  Serum Sodium: 140 mmol/L (Using max of 137 mmol/L) at 04/29/2024  3:10 PM  Total Bilirubin: 2.6 mg/dL at 89/76/7974  6:89 PM  Serum Albumin: 2.8 g/dL at 89/76/7974  6:89 PM  INR(ratio): 1.37 at 04/29/2024  3:10 PM  Age at listing (hypothetical): 58 years  Sex: Female at 04/29/2024  3:10 PM     I personally spent 30 minutes face-to-face and non-face-to-face in the care of this patient, which includes all pre, intra, and post visit time on the date of service.

## 2024-06-23 ENCOUNTER — Ambulatory Visit: Admit: 2024-06-23 | Discharge: 2024-06-25 | Payer: PRIVATE HEALTH INSURANCE

## 2024-06-23 ENCOUNTER — Encounter: Admit: 2024-06-23 | Discharge: 2024-06-25 | Payer: MEDICARE

## 2024-06-23 ENCOUNTER — Inpatient Hospital Stay: Admit: 2024-06-23 | Discharge: 2024-06-25 | Payer: PRIVATE HEALTH INSURANCE

## 2024-06-23 ENCOUNTER — Encounter
Admit: 2024-06-23 | Discharge: 2024-06-25 | Payer: MEDICARE | Attending: Student in an Organized Health Care Education/Training Program | Primary: Student in an Organized Health Care Education/Training Program

## 2024-06-23 LAB — COMPREHENSIVE METABOLIC PANEL
ALBUMIN: 2.5 g/dL — ABNORMAL LOW (ref 3.4–5.0)
ALKALINE PHOSPHATASE: 87 U/L (ref 46–116)
ALT (SGPT): 53 U/L — ABNORMAL HIGH (ref 10–49)
ANION GAP: 15 mmol/L — ABNORMAL HIGH (ref 5–14)
AST (SGOT): 66 U/L — ABNORMAL HIGH (ref <=34)
BILIRUBIN TOTAL: 1.9 mg/dL — ABNORMAL HIGH (ref 0.3–1.2)
BLOOD UREA NITROGEN: 24 mg/dL — ABNORMAL HIGH (ref 9–23)
BUN / CREAT RATIO: 17
CALCIUM: 9.1 mg/dL (ref 8.7–10.4)
CHLORIDE: 99 mmol/L (ref 98–107)
CO2: 25.5 mmol/L (ref 20.0–31.0)
CREATININE: 1.41 mg/dL — ABNORMAL HIGH (ref 0.55–1.02)
EGFR CKD-EPI (2021) FEMALE: 43 mL/min/1.73m2 — ABNORMAL LOW (ref >=60)
GLUCOSE RANDOM: 145 mg/dL (ref 70–179)
POTASSIUM: 3.3 mmol/L — ABNORMAL LOW (ref 3.4–4.8)
PROTEIN TOTAL: 6 g/dL (ref 5.7–8.2)
SODIUM: 139 mmol/L (ref 135–145)

## 2024-06-23 LAB — CBC W/ AUTO DIFF
BASOPHILS ABSOLUTE COUNT: 0 10*9/L (ref 0.0–0.1)
BASOPHILS RELATIVE PERCENT: 0.6 %
EOSINOPHILS ABSOLUTE COUNT: 0.1 10*9/L (ref 0.0–0.5)
EOSINOPHILS RELATIVE PERCENT: 1.4 %
HEMATOCRIT: 19 % — ABNORMAL LOW (ref 34.0–44.0)
HEMOGLOBIN: 5.8 g/dL — ABNORMAL LOW (ref 11.3–14.9)
LYMPHOCYTES ABSOLUTE COUNT: 1.4 10*9/L (ref 1.1–3.6)
LYMPHOCYTES RELATIVE PERCENT: 23.7 %
MEAN CORPUSCULAR HEMOGLOBIN CONC: 30.6 g/dL — ABNORMAL LOW (ref 32.0–36.0)
MEAN CORPUSCULAR HEMOGLOBIN: 21.6 pg — ABNORMAL LOW (ref 25.9–32.4)
MEAN CORPUSCULAR VOLUME: 70.4 fL — ABNORMAL LOW (ref 77.6–95.7)
MEAN PLATELET VOLUME: 10.6 fL (ref 6.8–10.7)
MONOCYTES ABSOLUTE COUNT: 0.4 10*9/L (ref 0.3–0.8)
MONOCYTES RELATIVE PERCENT: 7 %
NEUTROPHILS ABSOLUTE COUNT: 4 10*9/L (ref 1.8–7.8)
NEUTROPHILS RELATIVE PERCENT: 67.3 %
NUCLEATED RED BLOOD CELLS: 0 /100{WBCs} (ref <=4)
PLATELET COUNT: 65 10*9/L — ABNORMAL LOW (ref 150–450)
RED BLOOD CELL COUNT: 2.69 10*12/L — ABNORMAL LOW (ref 3.95–5.13)
RED CELL DISTRIBUTION WIDTH: 17.5 % — ABNORMAL HIGH (ref 12.2–15.2)
WBC ADJUSTED: 5.9 10*9/L (ref 3.6–11.2)

## 2024-06-23 LAB — PRO-BNP: PRO-BNP: 972 pg/mL — ABNORMAL HIGH (ref <=300.0)

## 2024-06-23 LAB — APTT
APTT: 28.7 s (ref 24.8–38.4)
HEPARIN CORRELATION: 0.2

## 2024-06-23 LAB — PROTIME-INR
INR: 1.53
PROTIME: 17.3 s — ABNORMAL HIGH (ref 9.9–12.6)

## 2024-06-23 LAB — HIGH SENSITIVITY TROPONIN I - SINGLE: HIGH SENSITIVITY TROPONIN I: 6 ng/L (ref <=34)

## 2024-06-23 LAB — MAGNESIUM: MAGNESIUM: 1.6 mg/dL (ref 1.6–2.6)

## 2024-06-23 MED ADMIN — sodium chloride (NS) 0.9 % infusion: 100 mL/h | INTRAVENOUS | @ 22:00:00 | Stop: 2024-06-23

## 2024-06-23 MED ADMIN — acetaminophen (TYLENOL) tablet 650 mg: 650 mg | ORAL | @ 23:00:00 | Stop: 2024-06-23

## 2024-06-23 MED FILL — XIFAXAN 550 MG TABLET: ORAL | 90 days supply | Qty: 180 | Fill #3

## 2024-06-23 NOTE — ED Triage Note (Addendum)
 Pt states she was getting up from the toilet and missed the grab bar and fell back on 06/13/2024, has a lac to the back of her head that is scabbed over, c/o lower back and tailbone  pain since  unsure of LOC/syncope. Pt is hypotensive in triage. States she has felt lightheaded, pt reports she has liver disease, reports falling thanksgiving day and yesterday when she went to the doctor.

## 2024-06-23 NOTE — H&P (Shared)
 Family Medicine Inpatient Service History & Physical    Assessment & Plan:   Denise York is a 58 y.o. female whose presentation is complicated by *** that presented to {FAM Location:119652} with ***.     Active Problems:    * No active hospital problems. *    {The problem list should be reviewed and updated in Epic admission navigator to reflect active medical issues:75688:::1}    Active Problems    ***    Chronic Problems    ***    # Tobacco use disorder: Patient currently smoking ***PPD. Will place inpatient consult to tobacco cessation for toxic effects of tobacco with [hospital diagnosis]*** and possible nicotine withdrawal.  - Tobacco cessation consult  - Nicotine replacement therapy with patch, gum, lozenge PRN    {The patient's presentation is complicated by the following clinically significant conditions requiring additional evaluation and treatment :88492}   {After reviewing list move clinically significant conditions above in the assessment and plan then delete generated conditions before signing note:75688:::1}      Issues Impacting Complexity of Management:  {Issues Impacting Complexity of Management (Optional):96701}      {Medical Decision Making (Optional):97088}      Checklist:  Diet: {JLH DIET:49409::Regular Diet}  DVT PPx: {JLHDVTPPXNEW:65522}  Code Status: Full Code  Dispo: Patient appropriate for {Patient Status:93989}    Team Contact Information:   Primary Team: {FAMTeams:119651}  Primary Resident: Marry JINNY Louder, DO  Resident's Pager: {FamilyMedTeamsUpdate:112027}    Chief Concern:   No Principal Problem: There is no principal problem currently on the Problem List. Please update the Problem List and refresh.  {The principal admitting problem should be reviewed and updated in Epic admission navigator:75688:::1}    Subjective:   Denise York is a 58 y.o. female with pertinent PMHx of *** presenting with ***   {Name, Age, sex, pertinent aspects of PMHx and presenting complaints (what the patient says is the reason why he/she/they presented):75688:::1}      History obtained by ***.   {Specify reason why the patient was unable to provide history:75688:::1}    HPI:  ***      Pertinent Surgical Hx  ***    Pertinent Family Hx  ***    Pertinent Social Hx   ***    Allergies  Shellfish containing products, Glipizide , and Lisinopril     {Med Rec Confidence Izujpo:02515}  Prior to Admission medications   Medication Dose, Route, Frequency   atorvastatin  (LIPITOR ) 20 MG tablet 20 mg, Oral, Daily (standard)   loratadine  (CLARITIN ) 10 mg tablet 10 mg, Oral, Daily (standard)   metFORMIN  (GLUCOPHAGE -XR) 500 MG 24 hr tablet TAKE 1 TABLET BY MOUTH EVERY EVENING with dinner   omeprazole  (PRILOSEC) 20 MG capsule TAKE 1 CAPSULE BY MOUTH TWICE DAILY 30MINS BEFORE MEALS   blood sugar diagnostic (GLUCOSE BLOOD) Strp Use to test blood sugar daily   blood-glucose meter kit Use to test blood sugar DAILY   bumetanide  (BUMEX ) 1 MG tablet 2 mg, Oral, Daily (standard)   cholecalciferol, vitamin D3 25 mcg, 1,000 units,, 1,000 unit (25 mcg) tablet 50 mcg, Daily (standard)   DAILY-VITE, WITH FOLIC ACID, 400 mcg Tab tablet 1 tablet, Daily (standard)   inhalational spacing device (AEROCHAMBER MV) Spcr Use as directed with inhalers   lactulose  (CEPHULAC ) 20 gram packet Mix and take 2 packets (40 g total) by mouth Three (3) times a day.   lancets Misc Use to test blood sugar daily   naltrexone  (DEPADE) 50 mg tablet 50  mg, Oral, Daily (standard)   OZEMPIC  2 mg/dose (8 mg/3 mL) PnIj INJECT 2MG  under the skin once weekly (every 7 days)   rifAXIMin  (XIFAXAN ) 550 mg Tab 550 mg, Oral, 2 times a day (standard)   sertraline  (ZOLOFT ) 100 MG tablet 100 mg, Oral, Daily (standard)   spironolactone  (ALDACTONE ) 100 MG tablet 200 mg, Oral, Daily (standard)       Designated Healthcare Decision Maker:  Ms. Daggs currently {Blank single:19197::has,lacks} decisional capacity for healthcare decision-making and is {Blank single:19197::able,unable} to designate a surrogate healthcare decision maker. Ms. Benda designated healthcare decision maker(s) is/are ***NAME (the patient's {Blank single:19197::legal guardian,Healthcare Power of [employer] Power of Attorney,spouse,adult child,parent,adult sibling,other adult,care team physician(s)}) as denoted by {HCDM Authority:51785}.    Objective:   Physical Exam:  Temp:  [36.7 ??C (98 ??F)-36.9 ??C (98.4 ??F)] 36.9 ??C (98.4 ??F)  Pulse:  [75-80] 80  SpO2 Pulse:  [75-80] 77  Resp:  [15-16] 16  BP: (89-130)/(44-71) 105/61  SpO2:  [97 %-100 %] 97 %    Gen: NAD, converses   Eyes: Sclera anicteric, EOMI grossly normal   HENT: Atraumatic, normocephalic  Neck: Trachea midline  Heart: RRR  Lungs: CTAB, no crackles or wheezes  Abdomen: Soft, NTND  Extremities: No edema  Neuro: Grossly symmetric, non-focal    Skin:  No rashes, lesions on clothed exam  Psych: Alert, oriented

## 2024-06-23 NOTE — Telephone Encounter (Signed)
 Returned page on 06/23/24 at 11:21 PM from [facility] Medicine.    Patient presented after a fall at home at which time she stuck her head. She was found to have a Hg of 5.8 but denies any GI bleeding. CT head and C-spine non concerning. She also has a history of decompensated cirrhosis (Ascites and encephalopathy on medication), last saw Dr. Suellyn yesterday without notation of current symptoms of decompensation but was reporting falls then a which time her carvedilol  was stopped.    The reason for ED presentation was for fall at home with head injury and the question asked by [facility] was whether or not the patient could be admitted to [facility] versus main given history of decompensated cirrhosis. I advised the admitting team that patient should not be admitted to [facility] if the reason for admission is decompensated cirrhosis and should instead be transferred to main; however, if the patient has a history of decompensated cirrhosis but this is not an active problem, controlled with medication or resolved, and there is a another non-GI reason for admission, the patient can stay at [facility]. Of note, the patient's last EGD in 08/2023 was negative for esophageal varices. She is currently HDS.    The patient was not seen or evaluated at bedside by this clinical research associate. These preliminary recommendations were made based on clinical assessment as communicated by the primary team and by chart review. A formal consult will follow tomorrow.     Norman Slovak, MD PhD  Gastroenterology Fellow  06/23/2024 11:35 PM

## 2024-06-23 NOTE — ED Provider Notes (Signed)
 [facility]  Emergency Department Provider Note    ED Clinical Impression     Final diagnoses:   Anemia, unspecified type (Primary)       HPI, ED Course, Assessment and Plan     Initial Clinical Impression:    June 23, 2024 3:45 PM   Denise York is a 58 y.o. 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 The patient reports lower back and tailbone pain in addition to occipital laceration s/p recent unwitnessed fall on 12/07. She believes that she might have lost consciousness during this incident, as she found herself on the floor after attempting to get up from the toilet and hold to grab bar. She was found on the floor by a family member, with blood noted to the back of her head. On evaluation today, the patient endorses increased recent falls in the setting of lightheadedness and dizziness, such that she has fell 3 times since Thanksgiving. She is not anticoagulated.     BP 109/47  - Pulse 75  - Temp 36.8 ??C (98.2 ??F) (Temporal)  - Resp 15  - Wt (!) 135.2 kg (298 lb)  - SpO2 99%  - BMI 51.15 kg/m??     On exam, patient is nontoxic appearing, and in no acute distress. Vitals are remarkable for hypotension to 95/54 but are otherwise within normal limits; patient is hemodynamically stable, afebrile. Physical exam notable for scab that has healed over to the center of the occiput, hemostatic. Minor midline C-spine tenderness to palpation. Lumbar spinal tenderness to palpation. Tenderness to palpation along mid upper thigh, none to the hip. AOx4. 5/5 strength in upper and lower extremities. Sensation intact throughout.    Medical Decision Making    Differential diagnosis includes, but not limited to:   - Anemia, suspected due to blood loss from scalp wound: No reported brown stools. No other obvious blood loss.  Defer to medicine for workup of possible complicating iron deficiency anemia or alternative issue of production.  Plan to transfuse.  - ICH, C-spine injury: Will rule out with CT imaging given fall and age.  - Lumbar spinal injury: Will get CT C-spine given tenderness to palpation in the lower back.  Patient does have a reassuring neurologic exam and has been ambulatory per day, thus lower suspicion for unstable spinal fracture.  - Hip fracture: Lower suspicion for fracture as patient has been ambulatory, nonetheless patient tender along the left femur and left hip, thus will get x-ray.      CBC paced from triage notable for anemia (Hgb 5.8). CMP with creatinine elevated to 1.41, AST elevated to 66, and ALT elevated to 53.     Plan to obtain EKG, Troponin, pro-BNP, POCT glucose, PT-INR, PTT, and type and screen. Plan to consent patient for transfusion.    Further ED updates and updates to plan as per ED Course below:    ED Course as of 06/23/24 2221   Wed Jun 23, 2024   1619 Twelve-lead EKG reviewed: NSR, no ST changes, normal PR/QRS/QT intervals   1923 Discussed with neurosurgery, suspect the fracture might be subacute to chronic as some signs that it may be healed on imaging.  Recommending upright x-ray.  If no change in alignment then no need for a brace.  If there is a change, then we will call back spine.   2221 No significant change in alignment on  upright films.  Will admit to medicine for anemia and weakness.            Discussion with other professionals: Consultant - neurosurgery (see ED course above and documentation and PLC transfer encounter)  Independent interpretation: EKG(s) - normal sinus rhythm, no ST changes, QTc of 517 (prior Qtc 483)  I have reviewed recent and relavant previous record, including: Outpatient notes - 06/23/24 [facility] note for PMH   Escalation of Care including OBS/Admission/Transfer was considered: Admitted given need for blood transfusion and likely PT/OT, for due to weakness and multiple falls  Social Determinants that significantly affected care: none         Social Drivers of Health with Concerns     Physical Activity: Not on file   Stress: Not on file   Substance Use: Not on file (05/18/2023)   Intimate Partner Violence: Not on file   Social Connections: Not on file   Financial Resource Strain: Not on file   Health Literacy: Not on file   Internet Connectivity: Not on file     _____________________________________________________________________    Past History     PAST MEDICAL HISTORY/PAST SURGICAL HISTORY:   Past Medical History[1]    Past Surgical History[2]    MEDICATIONS:     Current Facility-Administered Medications:     sodium chloride  (NS) 0.9 % infusion, 100 mL/hr, Intravenous, Once, Augustin Gladis Leech, MD    Current Outpatient Medications:     atorvastatin  (LIPITOR ) 20 MG tablet, TAKE ONE TABLET BY MOUTH ONCE DAILY, Disp: 90 tablet, Rfl: 1    loratadine  (CLARITIN ) 10 mg tablet, TAKE ONE TABLET BY MOUTH ONCE DAILY, Disp: 90 tablet, Rfl: 1    metFORMIN  (GLUCOPHAGE -XR) 500 MG 24 hr tablet, TAKE 1 TABLET BY MOUTH EVERY EVENING with dinner, Disp: 90 tablet, Rfl: 1    omeprazole  (PRILOSEC) 20 MG capsule, TAKE 1 CAPSULE BY MOUTH TWICE DAILY BEFORE MEALS, Disp: 180 capsule, Rfl: 1    blood sugar diagnostic (GLUCOSE BLOOD) Strp, Use to test blood sugar daily, Disp: 100 strip, Rfl: 3    blood-glucose meter kit, Use to test blood sugar DAILY, Disp: 1 each, Rfl: 1    bumetanide  (BUMEX ) 1 MG tablet, TAKE 2 TABLETS BY MOUTH ONCE DAILY, Disp: 60 tablet, Rfl: 11    cholecalciferol, vitamin D3 25 mcg, 1,000 units,, 1,000 unit (25 mcg) tablet, Take 2 tablets (50 mcg total) by mouth daily., Disp: , Rfl:     DAILY-VITE, WITH FOLIC ACID, 400 mcg Tab tablet, Take 1 tablet by mouth daily., Disp: , Rfl:     inhalational spacing device (AEROCHAMBER MV) Spcr, Use as directed with inhalers, Disp: 1 each, Rfl: 0    lactulose  (CEPHULAC ) 20 gram packet, Mix and take 2 packets (40 g total) by mouth Three (3) times a day., Disp: 540 packet, Rfl: 3    lancets Misc, Use to test blood sugar daily, Disp: 100 each, Rfl: 3    naltrexone  (DEPADE) 50 mg tablet, TAKE ONE TABLET BY MOUTH ONCE DAILY, Disp: 30 tablet, Rfl: 11    OZEMPIC  2 mg/dose (8 mg/3 mL) PnIj, INJECT 2MG  under the skin once weekly (every 7 days), Disp: 9 mL, Rfl: 2    rifAXIMin  (XIFAXAN ) 550 mg Tab, Take 1 tablet (550 mg total) by mouth two (2) times a day., Disp: 180 tablet, Rfl: 3    sertraline  (ZOLOFT ) 100 MG tablet, Take 1 tablet (100 mg total) by mouth daily., Disp: 90 tablet, Rfl: 3  spironolactone  (ALDACTONE ) 100 MG tablet, TAKE 2 TABLETS BY MOUTH ONCE DAILY, Disp: 30 tablet, Rfl: 11    ALLERGIES:   Shellfish containing products, Glipizide , and Lisinopril     SOCIAL HISTORY:   Social History     Tobacco Use    Smoking status: Never     Passive exposure: Past    Smokeless tobacco: Never   Substance Use Topics    Alcohol use: No       FAMILY HISTORY:  Family History[3]     Review of Systems     A review of systems was performed and relevant portions were as noted above in HPI     Physical Exam     VITAL SIGNS:    BP 109/47  - Pulse 75  - Temp 36.8 ??C (98.2 ??F) (Temporal)  - Resp 15  - Wt (!) 135.2 kg (298 lb)  - SpO2 99%  - BMI 51.15 kg/m??     Constitutional:   Alert and oriented.  Head:   Scab that has healed over to the center of the occiput, hemostatic.   Eyes:   Conjunctivae are normal, EOMI, PERRL  ENT:   No notable congestion, Mucous membranes moist, External ears normal, no notable stridor  Cardiovascular:   Rate as vitals above. Appears warm and well perfused  Respiratory:   Normal respiratory effort. Breath sounds are normal.  Gastrointestinal:   Soft, non-distended, and nontender.   Genitourinary:   Deferred  Musculoskeletal:    Minor midline C-spine tenderness to palpation. Lumbar spinal tenderness to palpation. Tenderness to palpation along mid upper thigh, none to the hip.  Neurologic:   No gross focal neurologic deficits beyond baseline are appreciated. 5/5 strength in upper and lower extremities. Sensation intact throughout.  Skin:   Skin is warm, dry and intact.     Radiology     No orders to display       Labs     Labs Reviewed   COMPREHENSIVE METABOLIC PANEL - Abnormal; Notable for the following components:       Result Value    Potassium 3.3 (*)     Anion Gap 15 (*)     BUN 24 (*)     Creatinine 1.41 (*)     eGFR CKD-EPI (2021) Female 58 (*)     Albumin 2.5 (*)     Total Bilirubin 1.9 (*)     AST 66 (*)     ALT 53 (*)     All other components within normal limits   CBC W/ AUTO DIFF - Abnormal; Notable for the following components:    RBC 2.69 (*)     HGB 5.8 (*)     HCT 19.0 (*)     MCV 70.4 (*)     MCH 21.6 (*)     MCHC 30.6 (*)     RDW 17.5 (*)     Platelet 65 (*)     Microcytosis Moderate (*)     Anisocytosis Slight (*)     All other components within normal limits   POCT GLUCOSE, INTERFACED - Normal   CBC W/ DIFFERENTIAL    Narrative:     The following orders were created for panel order CBC w/ Differential.                  Procedure  Abnormality         Status                                     ---------                               -----------         ------                                     CBC w/ Differential[[id number]]         Abnormal            Final result                                                 Please view results for these tests on the individual orders.   PROTIME-INR   APTT   HIGH SENSITIVITY TROPONIN I - SINGLE   PRO-BNP   MAGNESIUM    TYPE AND SCREEN WITH ABORH CONFIRMATION   PREPARE RBC   POCT GLUCOSE-RN OBTAIN       Pertinent labs & imaging results that were available during my care of the patient were reviewed by me and considered in my medical decision making (see chart for details).    Please note- This chart has been created using [employer]. Chart creation errors have been sought, but may not always be located and such creation errors, especially pronoun confusion, do NOT reflect on the standard of medical care.    Documentation assistance was provided by Learta Loots, Scribe on June 23, 2024 at 3:45 PM for Gladis Duncan, MD.    Documentation assistance was provided by the scribe in my presence.  The documentation recorded by the scribe has been reviewed by me and accurately reflects the services I personally performed.           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    Obesity     Prediabetes    [2]   Past Surgical History:  Procedure Laterality Date    ABDOMINAL WALL MESH  REMOVAL  1997, 2005    Placement, 1997, removal and replacement 2005    BLADDER SURGERY      bladder tact X2    BREAST BIOPSY Right 04/2013    benign    BREAST EXCISIONAL BIOPSY Right 05/2013    benign    BREAST SURGERY Right     precancerous spot removed    CHOLECYSTECTOMY      HYSTERECTOMY      IR EMBOLIZATION VENOUS OTHER THAN HEMORRHAGE  02/17/2023    IR EMBOLIZATION VENOUS OTHER THAN HEMORRHAGE 02/17/2023 Yu, Hyeon, MD IMG VIR [facility]    PR COLONOSCOPY FLX DX W/COLLJ SPEC WHEN  PFRMD N/A 08/15/2023    Procedure: COLONOSCOPY, FLEXIBLE, PROXIMAL TO SPLENIC FLEXURE; DIAGNOSTIC, W/WO COLLECTION SPECIMEN BY BRUSH OR WASH;  Surgeon: Charlanne Kipper, MD;  Location: [facility] [place];  Service: Gastrointestinal    PR COLSC FLX W/RMVL OF TUMOR POLYP LESION SNARE TQ N/A 10/14/2016    Procedure: COLONOSCOPY FLEX; W/REMOV TUMOR/LES BY SNARE;  Surgeon: Eleanor Dewey Sorrel, MD;  Location: [facility] [place];  Service: Gastroenterology    PR COLSC FLX W/RMVL OF TUMOR POLYP LESION SNARE TQ N/A 01/15/2023    Procedure: COLONOSCOPY FLEX; W/REMOV TUMOR/LES BY SNARE;  Surgeon: Fae Alto, MD;  Location: [facility];  Service: Gastroenterology    PR GI IMAG INTRALUMINAL ESOPHAGUS-ILEUM W/I&R N/A 08/15/2023    Procedure: GI VIDEO TRACT IMAGE INTRALUMINAL (EG, CAPSULE ENDOSCOPY), ESOPHAGUS VIA ILEUM, PHYSICIAN INTERPRETATION & REPORT;  Surgeon: Charlanne Kipper, MD;  Location: [facility];  Service: Gastrointestinal    PR PERC CLOS,CONG INTERATRIAL COMMUN W/IMPL N/A 09/12/2022    Procedure: ASD closure;  Surgeon: Pia Sharper, MD;  Location: [facility] CATH;  Service: Cardiology    PR UPPER GI ENDOSCOPY,BIOPSY N/A 01/15/2023    Procedure: UGI ENDOSCOPY; WITH BIOPSY, SINGLE OR MULTIPLE;  Surgeon: Fae Alto, MD;  Location: [facility];  Service: Gastroenterology    PR UPPER GI ENDOSCOPY,DIAGNOSIS N/A 08/14/2023    Procedure: UGI ENDO, INCLUDE ESOPHAGUS, STOMACH, & DUODENUM &/OR JEJUNUM; DX W/WO COLLECTION SPECIMN, BY BRUSH OR WASH;  Surgeon: Charlanne Kipper, MD;  Location: [facility];  Service: Gastrointestinal    SKIN BIOPSY     [3]   Family History  Problem Relation Age of Onset    Heart disease Mother     Hypertension Mother     COPD Mother     Heart disease Father     Kidney disease Father     Allergies Father     Gout Father     Diabetes Father     Allergies Other         FAMILY H/O    Coronary artery disease Other         FAMILY H/O    Kidney failure Other         FAMILY H/O    No Known Problems Sister     No Known Problems Daughter     No Known Problems Maternal Grandmother     No Known Problems Maternal Grandfather     No Known Problems Paternal Grandmother     No Known Problems Paternal Grandfather     Alcohol abuse Maternal Uncle     Birth defects Son     Birth defects Son     BRCA 1/2 Neg Hx     Breast cancer Neg Hx     Cancer Neg Hx     Colon cancer Neg Hx     Endometrial cancer Neg Hx     Ovarian cancer Neg Hx     Substance Abuse Disorder Neg Hx     Mental illness Neg Hx         Augustin Gladis Leech, MD  06/24/24 1515

## 2024-06-23 NOTE — Hospital Course (Addendum)
 Denise York is a 58 y.o. female whose presentation is complicated by decompensated cirrhosis, hyperlipidemia, portal hypertension, pruritus, type 2 diabetes, and depression presenting to [facility] Emergency Department with recurrent falls and anemia.     Patient was found to be anemic at 5.8. She required 2u pRBC.  Her hemoglobin rose and stabilized and was 8.3 at discharge. There was no concern for active GI bleed. Additional lab work was drawn to rule out hemolysis which was reassuring against this. Suspected iron  deficiency given low iron  and low MCV. She was discharged on oral iron . We do recommend GI follow-up outpatient given known history of multiple AVMs.    # Recurrent Falls - Weakness   Likely due to chronic anemia. During admission, she had a nutrition consult and PT/OT evaluated her and she was set up with home health.      # T-Spine fracture  Patient has acute compression fracture of T12 with less than 25% of height loss, likely secondary to recent falls. Neurosurgery consulted, no need for surgical intervention. Pain was controlled with oral and topical medications. We recommend DEXA Scan outpatient.     # Thrombocytopenia  Remained within patient's baseline of 30-50.    # Cirrhosis - Ascites  Bumex  and spironolactone  was held on admission due to softer BP but restarted at discharge. Her lactulose  was continued.    Her other home medications were continued.

## 2024-06-24 LAB — IRON PANEL
IRON SATURATION (CALC): 20 %
IRON: 58 ug/dL (ref 50–170)
TOTAL IRON BINDING CAPACITY (CALC): 297.4 ug/dL (ref 250.0–425.0)
TRANSFERRIN: 236 mg/dL — ABNORMAL LOW (ref 250.0–380.0)

## 2024-06-24 LAB — CBC
HEMATOCRIT: 19.9 % — ABNORMAL LOW (ref 34.0–44.0)
HEMATOCRIT: 22.8 % — ABNORMAL LOW (ref 34.0–44.0)
HEMOGLOBIN: 6.5 g/dL — ABNORMAL LOW (ref 11.3–14.9)
HEMOGLOBIN: 7.4 g/dL — ABNORMAL LOW (ref 11.3–14.9)
MEAN CORPUSCULAR HEMOGLOBIN CONC: 32.4 g/dL (ref 32.0–36.0)
MEAN CORPUSCULAR HEMOGLOBIN CONC: 32.4 g/dL (ref 32.0–36.0)
MEAN CORPUSCULAR HEMOGLOBIN: 23.5 pg — ABNORMAL LOW (ref 25.9–32.4)
MEAN CORPUSCULAR HEMOGLOBIN: 24.3 pg — ABNORMAL LOW (ref 25.9–32.4)
MEAN CORPUSCULAR VOLUME: 72.6 fL — ABNORMAL LOW (ref 77.6–95.7)
MEAN CORPUSCULAR VOLUME: 75 fL — ABNORMAL LOW (ref 77.6–95.7)
MEAN PLATELET VOLUME: 11.2 fL — ABNORMAL HIGH (ref 6.8–10.7)
MEAN PLATELET VOLUME: 11.1 fL — ABNORMAL HIGH (ref 6.8–10.7)
PLATELET COUNT: 44 10*9/L — ABNORMAL LOW (ref 150–450)
PLATELET COUNT: 42 10*9/L — ABNORMAL LOW (ref 150–450)
RED BLOOD CELL COUNT: 2.74 10*12/L — ABNORMAL LOW (ref 3.95–5.13)
RED BLOOD CELL COUNT: 3.04 10*12/L — ABNORMAL LOW (ref 3.95–5.13)
RED CELL DISTRIBUTION WIDTH: 18.8 % — ABNORMAL HIGH (ref 12.2–15.2)
RED CELL DISTRIBUTION WIDTH: 20.3 % — ABNORMAL HIGH (ref 12.2–15.2)
WBC ADJUSTED: 4.8 10*9/L (ref 3.6–11.2)
WBC ADJUSTED: 5 10*9/L (ref 3.6–11.2)

## 2024-06-24 LAB — BASIC METABOLIC PANEL
ANION GAP: 15 mmol/L — ABNORMAL HIGH (ref 5–14)
BLOOD UREA NITROGEN: 23 mg/dL (ref 9–23)
BUN / CREAT RATIO: 18
CALCIUM: 8.7 mg/dL (ref 8.7–10.4)
CHLORIDE: 101 mmol/L (ref 98–107)
CO2: 27.3 mmol/L (ref 20.0–31.0)
CREATININE: 1.3 mg/dL — ABNORMAL HIGH (ref 0.55–1.02)
EGFR CKD-EPI (2021) FEMALE: 48 mL/min/1.73m2 — ABNORMAL LOW (ref >=60)
GLUCOSE RANDOM: 87 mg/dL (ref 70–179)
POTASSIUM: 3.8 mmol/L (ref 3.5–5.1)
SODIUM: 143 mmol/L (ref 135–145)

## 2024-06-24 LAB — LACTATE DEHYDROGENASE: LACTATE DEHYDROGENASE: 278 U/L — ABNORMAL HIGH (ref 120–246)

## 2024-06-24 LAB — FOLATE: FOLATE: 21 ng/mL (ref >=5.4)

## 2024-06-24 LAB — PERIPHERAL BLOOD SMEAR, PATH REVIEW

## 2024-06-24 LAB — HAPTOGLOBIN: HAPTOGLOBIN: <10 mg/dL — ABNORMAL LOW (ref 30–200)

## 2024-06-24 LAB — PROTIME-INR
INR: 1.49
PROTIME: 16.8 s — ABNORMAL HIGH (ref 9.9–12.6)

## 2024-06-24 LAB — FERRITIN: FERRITIN: 9.2 ng/mL — ABNORMAL LOW (ref 30.0–270.7)

## 2024-06-24 LAB — VITAMIN B12: VITAMIN B-12: 1871 pg/mL — ABNORMAL HIGH (ref 211–911)

## 2024-06-24 LAB — BILIRUBIN, DIRECT: BILIRUBIN DIRECT: 1 mg/dL — ABNORMAL HIGH (ref 0.00–0.30)

## 2024-06-24 MED ADMIN — cholecalciferol (vitamin D3 25 mcg (1,000 units)) tablet 50 mcg: 50 ug | ORAL | @ 14:00:00

## 2024-06-24 MED ADMIN — melatonin tablet 3 mg: 3 mg | ORAL | @ 08:00:00

## 2024-06-24 MED ADMIN — multivitamins, therapeutic with minerals tablet 1 tablet: 1 | ORAL | @ 14:00:00

## 2024-06-24 MED ADMIN — lactulose (CEPHULAC) packet 40 g: 40 g | ORAL | @ 14:00:00

## 2024-06-24 MED ADMIN — atorvastatin (LIPITOR) tablet 20 mg: 20 mg | ORAL | @ 14:00:00

## 2024-06-24 MED ADMIN — naltrexone (DEPADE) tablet 50 mg: 50 mg | ORAL | @ 19:00:00

## 2024-06-24 MED ADMIN — metFORMIN (GLUCOPHAGE-XR) 24 hr tablet 500 mg: 500 mg | ORAL | @ 23:00:00

## 2024-06-24 MED ADMIN — pantoprazole (Protonix) EC tablet 20 mg: 20 mg | ORAL | @ 23:00:00

## 2024-06-24 MED ADMIN — pantoprazole (Protonix) EC tablet 20 mg: 20 mg | ORAL | @ 14:00:00

## 2024-06-24 MED ADMIN — sertraline (ZOLOFT) tablet 100 mg: 100 mg | ORAL | @ 14:00:00

## 2024-06-24 MED ADMIN — rifAXIMin (XIFAXAN) tablet 550 mg: 550 mg | ORAL | @ 08:00:00 | Stop: 2024-06-27

## 2024-06-24 MED ADMIN — acetaminophen (TYLENOL) tablet 500 mg: 500 mg | ORAL | @ 14:00:00 | Stop: 2024-06-24

## 2024-06-24 MED ADMIN — potassium chloride ER tablet 40 mEq: 40 meq | ORAL | @ 07:00:00 | Stop: 2024-06-24

## 2024-06-24 NOTE — Procedures (Signed)
 ULTRASOUND PIV PROCEDURE NOTE    Indications:   Poor venous access.    Ultrasound guidance was necessary to obtain access.     Procedure Details:  Identity of the patient was confirmed via name, medical record number and date of birth. The availability of the correct equipment was verified.    The vein was identified and measured for ultrasound catheter insertion.       Vein measurement (without tourniquet):   0.35 cm   A(n) 20 gauge 1 catheter was selected based on the recommendations below:    [facility] Catheter/Vein Ratio Guidelines   Chart to determine PIV catheter size/length to use based on vein diameter and depth   Catheter Gauge Size (g)  22g 20g 18g   Catheter length (inches)  1.75 1.75 1.75   Catheter diameter measurement (mm) 0.9 mm 1.1 mm 1.3 mm          Minimum required vein diameter       Sonosite (cm)  0.18 cm 0.22 cm 0.26 cm                  (mm)  1.8 mm  2.2 mm  2.6 mm   Maximum vein depth  1.25 cm 1.25 cm 1.25 cm          The field was prepared with necessary supplies and equipment.  Probe cover and sterile gel utilized. Insertion site was prepped with chlorhexidineand allowed to dry.  The catheter extension was primed with normal saline.  The US  PIV was placed in the R Forearm with 1attempt(s). See LDA for additional details.    Catheter aspirated, 3 mL blood return present. The catheter was then flushed with 10 mL of normal saline. Insertion site cleansed, and dressing applied per manufacturer guidelines. The catheter was inserted with difficulty due to poor vasculature by Lynwood JAYSON Solo, RN.    Thank you,     Lynwood JAYSON Solo, RN    Ultrasound Resource Nurse    Workup / Procedure Time:  30 minutes

## 2024-06-24 NOTE — Progress Notes (Signed)
 Family Medicine Inpatient Progress Note    Assessment & Plan:   Denise York is a 58 y.o. female whose presentation is complicated by cirrhosis, hyperlipidemia, portal hypertension, pruritus, type 2 diabetes, and depression presenting to [facility] Emergency Department with recurrent falls and anemia.     Principal Problem:    Anemia  Active Problems:    Insomnia    Itching    Type 2 diabetes mellitus without complications (CMS-HCC)    Decompensated cirrhosis    (CMS-HCC)    Portal hypertension    (CMS-HCC)    Nausea and vomiting    Recurrent falls    Hypokalemia        Active Problems    # Anemia  Patient found to be anemic at 5.8. Hgb 9.1 on 04/29/24. Patient denies any signs of upper GI bleed or lower GI bleed. Patient received 1 unit of blood in the ED which increased hemoglobin up to 6.5.  GI was consulted and deemed pt appropriate for [facility]. Patient's MCV has been chronically low, which raises concern of possibly iron deficiency anemia being the source of drop in hemoglobin. Her ferritin in the last 4 months were 8.9 at 7.8. Ferritin now 9.2. Hgb improved to 7.4 s/p 2 units PRBCs. LDH mildly elevated 278.   - Daily CBC  - Follow up folate   - Follow up hemolysis labs: DAT, Reticulocytes, haptoglobin, and smear  - Consider IV iron infusion if rest of workup unremarkable     # Recurrent Falls - Weakness   Likely due to chronic anemia.  Patient also endorses poor appetite recently which could be secondary to Ozempic . Patient denies any LOC but endorses significant bleeding from scalp laceration 1.5 weeks ago. Lesion has since healed.  Patient also has also been having recurrent emesis 2/2 Ozempic  which may also be attributing to weakness.  - PT/OT eval and treat  - Nutrition consult     # T-Spine fracture  Patient has acute compression fracture of T12 with less than 25% of height loss. Injury is likely secondary to 1 of recent falls. Neurosurgery consulted, no need for surgical intervention. We will attempt to control pain with multimodal agents.  - PT/OT as above  - Lidocaine  patch q12h  - Tylenol  500 mg q8h PRN for mild pain     # Hypokalemia  Likely iso poor PO intake and frequent emesis.   - Daily BMP     Thrombocytopenia  Initially 73, but has trended down to 44.   - HOLD medical DVT prophylaxis  - CTM to with daily CBC  - Follow up hemolysis labs  - Transfuse Plt < 30     #Insomnia  Melatonin 3 mg nightly PRN     Chronic Problems     # Cirrhosis - Ascites  MELD score of 16.  A&O x 3. Patient recently stopped lactulose  1 week ago due to frequent bowel movements. No signs of decompensated cirrhosis.   - Continue lactulose  40 g 3 times daily  - HOLD Bumex  and spironolactone  iso hypotension, resume once BP stable  - Daily PT/INR     # Portal HTN - Esophageal varices  Carvedilol  recently stopped by Gastroenterologist.  Denies hematemesis.  - CTM     # Pruritus  - Continue naltrexone  50 mg     # HLD  - Continue atorvastatin  20 mg daily     # T2DM  - Continue metformin  500 mg daily     # Depression  -  Continue Zoloft  100 mg daily      Daily Checklist:  Diet: Regular Diet  DVT PPx: Contraindicated 2/2 thrombocytopenia  Electrolytes: Replete Potassium to >/= 3.6 and Magnesium  to >/= 1.8  Code Status: Full Code  Dispo: Goal Discharge: Home    Team Contact Information:   Primary Team: Family Medicine [place]  Primary Resident: Alyce SHAUNNA Pipes, MD  Resident's Pager: [facility] [place] [phone])    Interval History:   No acute events overnight. Tired and headache. Otherwise no complaints.     All other systems were reviewed and are negative except as noted in the HPI    Objective:   Temp:  [36.6 ??C (97.9 ??F)-36.9 ??C (98.4 ??F)] 36.9 ??C (98.4 ??F)  Pulse:  [75-83] 83  SpO2 Pulse:  [75-97] 97  Resp:  [15-18] 18  BP: (89-130)/(44-71) 100/66  SpO2:  [93 %-100 %] 98 %    Gen: NAD, converses   HENT: scabbing from recent head lac w/o active bleeding  Heart: RRR  Lungs: CTAB, no crackles or wheezes  Abdomen: soft, NTND  Extremities: No edema. Mild asterixis.      Alyce Pipes, MD PGY2

## 2024-06-24 NOTE — Consults (Addendum)
 Adult Nutrition Assessment Note    Visit Type: MD Consult, RN Consult  Reason for Visit: Assessment (Nutrition)    NUTRITION INTERVENTIONS and RECOMMENDATION     Trial Ensure Plus HP once daily- chocolate  Adjust as tolerated  Weigh weekly  Document PO intakes    NUTRITION ASSESSMENT     Current nutrition therapy is appropriate and progressing toward meeting nutritional needs at this time.   Patient would benefit from start of oral supplement to better meet nutritional needs.  Patient endorses improvement in appetite and nausea since admission. Amenable to ensure once daily.  50% one intake noted    NUTRITIONALLY RELEVANT DATA     HPI & PMH:   58 y.o. female whose presentation is complicated by decompensated cirrhosis, hyperlipidemia, portal hypertension, pruritus, type 2 diabetes, and depression presenting to [facility] Emergency Department with recurrent falls and anemia.        Nutrition History:   12/18- Patient report nausea on and off about 2-3 times per week over 6 months. Patient reports improved nausea over the week since eating a bit later in the day. Patient reports overall eating well enough over this time period of 6 months.    Medications:  Nutritionally pertinent medications reviewed and evaluated for potential food and/or medication interactions.   Vitamin D3, Lactulose , Multivitamin with minerals, Protonix   Labs:   Nutritionally pertinent labs reviewed.     Nutritional Needs:   Healthy balance of carbohydrate, protein, and fat.     Anthropometric Data:  Height: 165.1 cm (5' 5)   Admission weight: (!) 135.2 kg (298 lb)  Last recorded weight: (P) 90.8 kg (200 lb 1.6 oz) (06/24/24)  IBW: 56.75 kg  BMI: Body mass index is 33.3 kg/m?? (pended).   Usual Body Weight: 198 lb  Weight Assessment: overall stable weight noted; patient denies weight loss  Wt Readings from Last 20 Encounters:   06/24/24 (P) 90.8 kg (200 lb 1.6 oz)   06/22/24 89.3 kg (196 lb 14.4 oz)   04/29/24 89.9 kg (198 lb 1.6 oz) 02/16/24 90.2 kg (198 lb 12.8 oz)   02/09/24 90.7 kg (200 lb)   12/29/23 100.2 kg (221 lb)   12/29/23 100.2 kg (221 lb)   12/22/23 96.7 kg (213 lb 3.2 oz)   12/22/23 96.7 kg (213 lb 3.2 oz)   12/17/23 96.3 kg (212 lb 3.2 oz)   11/27/23 94.3 kg (208 lb)   11/03/23 92.5 kg (204 lb)   11/03/23 92.3 kg (203 lb 8 oz)   10/13/23 93.8 kg (206 lb 11.2 oz)   10/08/23 93.5 kg (206 lb 3.2 oz)   09/30/23 94.8 kg (209 lb)   09/29/23 94.2 kg (207 lb 11.2 oz)   09/11/23 92.5 kg (204 lb)   09/04/23 93.3 kg (205 lb 9.6 oz)   08/19/23 100.5 kg (221 lb 9.6 oz)       Malnutrition Assessment:  Malnutrition Assessment using AND/ASPEN or GLIM Clinical Characteristics:    Patient does not meet AND/ASPEN criteria for malnutrition at this time (06/24/24 1157)               Nutrition Focused Physical Exam:  Nutrition Focused Physical Exam:  Fat Areas Examined  Upper Arm: No loss      Muscle Areas Examined  Temple: No loss  Clavicle: No loss  Acromion: No loss  Patellar: No loss  Anterior Thigh: No loss  Posterior Calf: No loss  Nutrition Evaluation  Overall Impressions: No fat loss, No muscle loss (06/24/24 1157)     Care plan:  Patient does not meet malnutrition criteria     Current Nutrition:  Oral intake   Nutrition Orders            Nutrition Therapy Regular/House starting at 12/18 0043          Nutritionally Pertinent Allergies, Intolerances, Sensitivities, and/or Cultural/Religious Restrictions:  include shellfish    GOALS and EVALUATION     Patient to meet 75% or greater of nutritional needs via combination of meals, snacks, and/or oral supplements within admission.  - New    Motivation, Barriers, and Compliance:  Evaluation of motivation, barriers, and compliance completed. No concerns identified at this time.     Discharge Planning:   Monitor for potential discharge needs with multi-disciplinary team.         Follow-Up Parameters:   1-2 times per week (and more frequent as indicated)    Arleta Blanch, RD

## 2024-06-24 NOTE — Plan of Care (Signed)
 Shift Summary  2nd unit of PRBC completed to this shift with patient tolerating well with no s/s of transfusion reaction noted.   Mobility and activity tolerance were limited by fatigue, but independence was maintained in feeding and sitting activities, with supervision required for standing and transfers.  Fall prevention interventions were consistently maintained, including bed alarm, hourly visual checks, and scheduled toileting, with no reported falls or injuries.  Nutritional intake improved during the shift.    Absence of Fall and Fall-Related Injury: Fall reduction interventions, bed alarm, and hourly visual checks were consistently maintained throughout the shift, with toileting every two hours and no reported falls or injuries. Safety devices and supervision were used during transfers and mobility activities, supporting a safe environment.    Improved Ability to Complete Activities of Daily Living: Mobility and activity tolerance were limited by fatigue, but independence was maintained in feeding and sitting activities, with contact guard or supervision required for standing and transfers.    Anemia Symptom Improvement: Hemoglobin, hematocrit, and RBC remained low, and fatigue limited activity tolerance; red blood cells were prepared for transfusion, and nutritional intake improved with 100% of lunch consumed.       Problem: Adult Inpatient Plan of Care  Goal: Absence of Hospital-Acquired Illness or Injury  Intervention: Identify and Manage Fall Risk  Recent Flowsheet Documentation  Taken 06/24/2024 0851 by Sheppard Grooms I, RN  Safety Interventions:   fall reduction program maintained   low bed   nonskid shoes/slippers when out of bed   bed alarm  Intervention: Prevent Skin Injury  Recent Flowsheet Documentation  Taken 06/24/2024 1400 by Sheppard Grooms I, RN  Positioning for Skin: Supine/Back  Taken 06/24/2024 1200 by Sheppard Grooms I, RN  Positioning for Skin: Supine/Back  Taken 06/24/2024 1000 by Sheppard Grooms I, RN  Positioning for Skin: Supine/Back  Taken 06/24/2024 0851 by Sheppard Grooms I, RN  Positioning for Skin: Bed in Chair  Taken 06/24/2024 0734 by Sheppard Grooms I, RN  Positioning for Skin: Bed in Chair  Intervention: Prevent and Manage VTE (Venous Thromboembolism) Risk  Recent Flowsheet Documentation  Taken 06/24/2024 1400 by Brynli Ollis I, RN  Anti-Embolism Device Status: (Waiting on CD to find a pump) Other (Comment)  Taken 06/24/2024 1200 by Yaxiel Minnie I, RN  Anti-Embolism Device Status: (Waiting on CD to find a pump) Other (Comment)  Taken 06/24/2024 1000 by Amaiya Scruton I, RN  Anti-Embolism Device Status: (Waiting on CD to find a pump) Other (Comment)  Taken 06/24/2024 0851 by Sheppard Grooms I, RN  Anti-Embolism Device Status: (Waiting on CD to find a pump) Other (Comment)  Taken 06/24/2024 0734 by Sheppard Grooms I, RN  Anti-Embolism Device Status: (Waiting on CD to find a pump) Other (Comment)     Problem: Fall Injury Risk  Goal: Absence of Fall and Fall-Related Injury  Outcome: Shift Focus  Intervention: Promote Injury-Free Environment  Recent Flowsheet Documentation  Taken 06/24/2024 0851 by Sheppard Grooms I, RN  Safety Interventions:   fall reduction program maintained   low bed   nonskid shoes/slippers when out of bed   bed alarm     Problem: Self-Care Deficit  Goal: Improved Ability to Complete Activities of Daily Living  Outcome: Shift Focus     Problem: Anemia  Goal: Anemia Symptom Improvement  Outcome: Shift Focus  Intervention: Monitor and Manage Anemia  Recent Flowsheet Documentation  Taken 06/24/2024 0851 by Sheppard Grooms I, RN  Safety Interventions:   fall reduction program maintained   low bed  nonskid shoes/slippers when out of bed   bed alarm

## 2024-06-24 NOTE — Plan of Care (Signed)
 Shift Summary  Red blood cell transfusion was administered twice during the shift to address anemia.    Pain remained moderate and constant, with emotional support provided and one declined intervention.   Sleep improved after PRN melatonin administration.   Safety interventions and fall reduction strategies were maintained, with no reported falls or injuries.   Patient's overall status remained stable with ongoing comfort measures and transfusion support.     Absence of Hospital-Acquired Illness or Injury: No new hospital-acquired injuries or illnesses were documented during the shift; safety interventions and environment checks were consistently maintained, and bruising was noted on skin assessment.     Optimal Comfort and Wellbeing: Pain remained constant at a moderate level throughout the shift, described as aching and acute, with emotional support provided multiple times and one instance of declined intervention. Sleep improved after PRN melatonin administration.     Readiness for Transition of Care: Discharge planning information was updated, including living situation, support systems, and absence of home care services; patient was given information on advance directives.     Absence of Fall and Fall-Related Injury: Fall reduction program and safety interventions were maintained, hourly visual checks and scheduled toileting were performed, and no falls or injuries were reported.       Problem: Adult Inpatient Plan of Care  Goal: Absence of Hospital-Acquired Illness or Injury  Outcome: Shift Focus  Intervention: Identify and Manage Fall Risk  Recent Flowsheet Documentation  Taken 06/24/2024 0400 by Orlando Wes CROME, RN  Safety Interventions:   fall reduction program maintained   low bed  Intervention: Prevent Skin Injury  Recent Flowsheet Documentation  Taken 06/24/2024 0400 by Orlando Wes CROME, RN  Positioning for Skin: Supine/Back  Intervention: Prevent and Manage VTE (Venous Thromboembolism) Risk  Recent Flowsheet Documentation  Taken 06/24/2024 0400 by Orlando Wes CROME, RN  Anti-Embolism Device Status: Off  Goal: Optimal Comfort and Wellbeing  Outcome: Shift Focus  Goal: Readiness for Transition of Care  Outcome: Shift Focus  Goal: Rounds/Family Conference  Outcome: Shift Focus     Problem: Fall Injury Risk  Goal: Absence of Fall and Fall-Related Injury  Outcome: Shift Focus  Intervention: Promote Injury-Free Environment  Recent Flowsheet Documentation  Taken 06/24/2024 0400 by Orlando Wes CROME, RN  Safety Interventions:   fall reduction program maintained   low bed

## 2024-06-24 NOTE — Consults (Signed)
 OCCUPATIONAL THERAPY  Evaluation (06/24/24 1223)    Patient Name:  Denise York       Medical Record Number: [id number]     Date of Birth: 26-Apr-1966  Sex: Female      Post-Discharge Occupational Therapy Recommendations: 3x weekly          Equipment Recommendation  OT DME Recommendations: Other  Other Recommendations: To be determined       OT Treatment Diagnosis: Reduced mobility, Unsteadiness on feet, Limitation of activities due to disability         Assessment  Assessment: Denise York is a 58 y.o. female with pertinent PMHx of decompensated cirrhosis, hyperlipidemia, portal hypertension, pruritus, type 2 diabetes, and depression presenting with recurrent falls and anemia. Pt presents to OT with deficits in safety awareness, strength, activity tolerance, balance, orthopedic restrictions related to T12 compression fx, endurance, and high pain which are impacting independence and safety in ADLs; including functional mobility. With consideration of Pt's occupational profile and history review, assessment of occupational performance deficits, and level of clinical decision making involved, Pt presents as a moderate complexity evaluation. Pt would benefit from skilled OT to address identified performance concerns and address goals outlined in OT POC. Recommend post acute OT: 3x. DME - to be determined.    Problem List: Bowel dysfunction, Decreased activity tolerance, Decreased endurance, Decreased mobility, Decreased range of motion, Decreased safety awareness, Decreased strength, Dizziness/vertigo, Fall risk, Gait deviation, Impaired ADLs, Impaired balance, Obesity, Pain, Orthopedic restrictions  Personal Factors/Comorbidities (Occupational Profile and History Review): Expanded (Moderate)  Assessment of Occupational Performance : Balance, Endurance, Mobility, Strength  Clinical Decision Making: Moderate Complexity    Skilled Interventions Performed: ADL retraining, Balance activities, Bed mobility, Conservation, Education - Patient, Functional cognition, Functional mobility, Endurance activities, Postular / Proximal stability, Safety education, Transfer training  Today's Interventions: Educated Pt on role of OT and OT eval in acute setting. Engaged Pt in ADL occupations, including functional mobility to increase functional strength, endurance, and activity tolerance. Pt performed supine <> sit t/f's at EOB; sit <> stand t/f's at EOB; x2 ~3 ft functional ambulation; sit <> stand t/f's at [facility]; and toileting tasks all with supervision/SBA for safety awareness and decreased balance. Pt declined use of RW; given recommendation for use during described mobility tasks; demonstrating limited safety awareness. Pt described high increase in back pain during interventions, which resolved at rest. Pt performed self-feeding with set-up (A) at bed level. Educated Pt on recommended acute/post-acute OT POC; in which Pt demonstrated strong understanding with motivation for further HH OT to increase functional strength, endurance, and independence within ADLs.    Activity Tolerance During Today's Session  Limited by fatigue    Plan  Planned Frequency of Treatment: Plan of Care Initiated: 06/24/24  1-2x per day Weekly Frequency: 3-4 days per week  Planned Treatment Duration: 07/08/24    Planned Interventions:  Education (Patient/Family/Caregiver), Self-Care/Home Training, Therapeutic Exercise, Home Exercise Program, Therapeutic Activity      GOALS:   Patient and Family Goals: Go home    Short Term:   SHORT GOAL #1: Pt will complete toilet transfer, clothing management, and toilet hygiene with mod (I) and LRAD.   Time Frame : 2 weeks  SHORT GOAL #2: Pt will complete full body dressing, with mod (I), adaptive techniques, and AE PRN.   Time Frame : 2 weeks  SHORT GOAL #3: Pt will complete grooming routine, standing at sink for ~10 minutes with mod (I) and LRAD.   Time Frame :  2 weeks           Long Term Goal #1: Pt to complete toileting, dressing, and grooming routines; consecutively, all with mod (I) and LRAD.  Time Frame: 4 weeks    Prognosis:  Good  Positive Indicators:  PLOF; caregiver support  Barriers to Discharge: Gait instability, Impaired Balance, Pain, Endurance deficits    Subjective  Medical Updates Since Last Visit/Relevant PMH Affecting Clinical Decision Making:    Prior Functional Status Prior to hospitalization; Pt reports mod (I)-supervision for performance of daily ADLs, identifying high pain sitting on shower bench while showering; and at times requiring husband to supervise showers for safety. Pt's husband completes all IADLs, including driving. Pt endorses multiple recent falls, yet unable to identify specific causes, citing didn't know what happened. Pt has a rollator yet reports not using it all of the time.    Living Situation  Living environment: House  Lives With: Spouse  Home Living: One level home, Able to Live on main level with bedroom/bathroom, Ramped entrance, Handicapped height toilet, Grab bars in shower, Grab bars around toilet, Built-in shower seat, Hand-held shower hose  Caregiver Identified?: Yes (Husband)  Caregiver Availability: Intermittent (Unable to verify specific hours)  Caregiver Ability: Limited lifting (Unable to verify specific assist level)  Caregiver Identified?: Yes  Equipment available at home: Rollator, Hand-held shower hose    Medical Tests / Procedures: Reviewed in EPIC       Patient / Caregiver reports: When I fell, I really didn't know what had happened      Past Medical History[1] Social History     Tobacco Use    Smoking status: Never     Passive exposure: Past    Smokeless tobacco: Never   Substance Use Topics    Alcohol use: No      Past Surgical History[2] Family History[3]     Shellfish containing products, Glipizide , and Lisinopril      Objective Findings  Precautions / Restrictions  Falls precautions (T12 compression fx)      Weight Bearing                   Required Braces or Orthoses  Non-applicable    Use of log roll technique for bed mobility.    Communication Preference  Verbal       Pain  Pt endorsed 9/10 pain in back during functional mobility tasks; resolved with rest. RN made aware    Equipment / Environment  Vascular access (PIV, TLC, Port-a-cath, PICC)    Cognition   Orientation Level:  Oriented x 4   Arousal/Alertness:  Appropriate responses to stimuli   Attention Span:  Appears intact   Memory:  Appears intact   Following Commands:  Follows all commands without difficulty   Safety Judgment:  Decreased awareness of need for safety   Awareness of Errors and Problem Solving:  Patient self-corrected errors, Able to problem solve independently   Comments:      Vision / Hearing   Vision: No acute deficits identified, Glasses not present     Hearing: No deficit identified, Hearing aids not present         Hand Function:  Right Hand Function: Right hand grip strength, ROM and coordination WNL  Left Hand Function: Left hand grip strength, ROM and coordination WNL  Hand Dominance: Right    Skin Inspection:  Skin Inspection: Bruising, Intact where visualized    Face/Cervical ROM:  Face ROM: WFL  Cervical ROM: [facility]  ROM / Strength:  UE ROM/Strength: Left Impaired/Limited, Right Impaired/Limited  RUE Impairment: Reduced strength  LUE Impairment: Reduced strength  LE ROM/Strength: Left Impaired/Limited, Right Impaired/Limited  RLE Impairment: Reduced strength, Limited AROM  LLE Impairment: Reduced strength, Limited AROM    Sensation:  RUE Sensation: RUE intact  LUE Sensation: LUE intact  RLE Sensation: RLE intact  LLE Sensation: LLE intact    Balance:  Static Sitting-Level of Assistance: Independent  Dynamic Sitting-Level of Assistance: Archivist Standing-Level of Assistance: Supervision  Dynamic Standing - Level of Assistance: Supervision    Functional Mobility  Transfers: Standby assist  Bed Mobility : Modified Independent    Ambulation  Level of Assistance: Standby assist, set-up cues, supervision of patient - no hands on  Assistive Device: Rolling walker    ADLs  Feeding : Set Up Assist, Performed at bed level  Toileting: Supervision, Performed seated, Performed standing    IADLs: NT    Vitals / Orthostatics  Vitals/Orthostatics: BP - 115/51 following functional mobility; HR 80; 02 - 97%    Patient at end of session: All needs in reach, In bed, Alarm activated, Lines intact, Notified Nurse     Occupational Therapy Session Duration  OT Individual [mins]: 33           AM-PAC Daily Activity Assessment  Lower Body Dressing assistance needs: A Little - Minimal/Contact Guard Assist/Supervision  Bathing assistance needs: A Little - Minimal/Contact Guard Assist/Supervision  Toileting assistance needs: A Little - Minimal/Contact Guard Assist/Supervision  Upper Body Dressing assistance needs: None - Modified Independent/Independent  Personal Grooming assistance needs: A Little - Minimal/Contact Guard Assist/Supervision  Eating Meals assistance needs: None - Modified Independent/Independent      Daily Activity Score: 20    Score (in points): % of Functional Impairment, Limitation, Restriction  5: 100% impaired, limited, restricted  6-7: At least 80%, but less than 100% impaired, limited restricted  8-11: At least 60%, but less than 80% impaired, limited restricted  12-16: At least 40%, but less than 60% impaired, limited restricted  17-18: At least 20%, but less than 40% impaired, limited restricted  19: At least 1%, but less than 20% impaired, limited restricted  20: 0% impaired, limited restricted    'AM-PAC' forms are Copyright protected by The Trustees of [facility]       I attest that I have reviewed the above information.  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    Obesity     Prediabetes    [2]   Past Surgical History:  Procedure Laterality Date    ABDOMINAL WALL MESH  REMOVAL  1997, 2005    Placement, 1997, removal and replacement 2005    BLADDER SURGERY      bladder tact X2    BREAST BIOPSY Right 04/2013    benign    BREAST EXCISIONAL BIOPSY Right 05/2013    benign    BREAST SURGERY Right  precancerous spot removed    CHOLECYSTECTOMY      HYSTERECTOMY      IR EMBOLIZATION VENOUS OTHER THAN HEMORRHAGE  02/17/2023    IR EMBOLIZATION VENOUS OTHER THAN HEMORRHAGE 02/17/2023 Babara Lash, MD IMG VIR [facility]    PR COLONOSCOPY FLX DX W/COLLJ SPEC WHEN PFRMD N/A 08/15/2023    Procedure: COLONOSCOPY, FLEXIBLE, PROXIMAL TO SPLENIC FLEXURE; DIAGNOSTIC, W/WO COLLECTION SPECIMEN BY BRUSH OR WASH;  Surgeon: Charlanne Kipper, MD;  Location: [facility] [place];  Service: Gastrointestinal    PR COLSC FLX W/RMVL OF TUMOR POLYP LESION SNARE TQ N/A 10/14/2016    Procedure: COLONOSCOPY FLEX; W/REMOV TUMOR/LES BY SNARE;  Surgeon: Eleanor Dewey Sorrel, MD;  Location: [facility] ;  Service: Gastroenterology    PR COLSC FLX W/RMVL OF TUMOR POLYP LESION SNARE TQ N/A 01/15/2023    Procedure: COLONOSCOPY FLEX; W/REMOV TUMOR/LES BY SNARE;  Surgeon: Fae Alto, MD;  [facility]: [facility];  Service: Gastroenterology    PR GI IMAG INTRALUMINAL ESOPHAGUS-ILEUM W/I&R N/A 08/15/2023    Procedure: GI VIDEO TRACT IMAGE INTRALUMINAL (EG, CAPSULE ENDOSCOPY), ESOPHAGUS VIA ILEUM, PHYSICIAN INTERPRETATION & REPORT;  Surgeon: Charlanne Kipper, MD;  [facility]: [facility];  Service: Gastrointestinal    PR PERC CLOS,CONG INTERATRIAL COMMUN W/IMPL N/A 09/12/2022    Procedure: ASD closure;  Surgeon: Pia Sharper, MD;  Location: [facility] CATH;  Service: Cardiology    PR UPPER GI ENDOSCOPY,BIOPSY N/A 01/15/2023    Procedure: UGI ENDOSCOPY; WITH BIOPSY, SINGLE OR MULTIPLE;  Surgeon: Fae Alto, MD;  Location: [facility];  Service: Gastroenterology    PR UPPER GI ENDOSCOPY,DIAGNOSIS N/A 08/14/2023    Procedure: UGI ENDO, INCLUDE ESOPHAGUS, STOMACH, & DUODENUM &/OR JEJUNUM; DX W/WO COLLECTION SPECIMN, BY BRUSH OR WASH;  Surgeon: Charlanne Kipper, MD;  Location: [facility];  Service: Gastrointestinal    SKIN BIOPSY     [3]   Family History  Problem Relation Age of Onset    Heart disease Mother     Hypertension Mother     COPD Mother     Heart disease Father     Kidney disease Father     Allergies Father     Gout Father     Diabetes Father     Allergies Other         FAMILY H/O    Coronary artery disease Other         FAMILY H/O    Kidney failure Other         FAMILY H/O    No Known Problems Sister     No Known Problems Daughter     No Known Problems Maternal Grandmother     No Known Problems Maternal Grandfather     No Known Problems Paternal Grandmother     No Known Problems Paternal Grandfather     Alcohol abuse Maternal Uncle     Birth defects Son     Birth defects Son     BRCA 1/2 Neg Hx     Breast cancer Neg Hx     Cancer Neg Hx     Colon cancer Neg Hx     Endometrial cancer Neg Hx     Ovarian cancer Neg Hx     Substance Abuse Disorder Neg Hx     Mental illness Neg Hx

## 2024-06-24 NOTE — Consults (Signed)
 PHYSICAL THERAPY  Evaluation (06/24/24 1057)          Patient Name:  Denise York       Medical Record Number: [id number]   Date of Birth: 04-28-1966  Sex: Female        Post-Discharge Physical Therapy Recommendations:  PT Post Acute Discharge Recommendations: 3x weekly, Supervision for all mobility due to fall risk   Discharge transport recommendations : Ambulatory        Treatment Diagnosis: Abnormalities of gait and mobility, Difficulty in walking, Generalized muscle weakness             ASSESSMENT  Problem List: Decreased endurance, Decreased mobility, Decreased strength, Fall risk, Gait deviation, Pain, At risk for deconditioning      Assessment : Denise York is a 58 y.o. female with pertinent PMHx of decompensated cirrhosis, hyperlipidemia, portal hypertension, pruritus, type 2 diabetes, and depression presenting with recurrent falls and anemia.    Pt presents to skilled PT services below functional baseline, requiring RW CGA for ambulation on level surface. Pt experiencing decline in physical function since September with >3 recurrent falls. Pt is overall limited by pain and LLE weakness due to T12 fx. Therapist reviewed techniques for falls risk reduction. Pt would benefit from continued skilled PT services to address safety, education and deficits. After review of contributing co-morbidities and personal factors, clinical presentation and exam findings, patient demonstrates moderate complexity for evaluation and development of POC.      Today's Interventions: Gait training, Therapeutic activity, Patient/Family/Caregiver Education  Today's Interventions: PT eval, vital monitoring, bed mobility, STS transfer, toileting, pericare, dressing, ambulation; pt ed re: role of PT, POC, fall risk reduction     Personal Factors/Comorbidities Present: 3+ factors   Examination of Body systems: 4+ elements  Clinical Presentation: Evolving    Eval Complexity : Moderate Complexity     Activity Tolerance: Tolerated treatment well, Limited by pain     PLAN  Planned Frequency of Treatment: Plan of Care Initiated: 06/24/24  1-2x per day Weekly Frequency: 3-4 days per week  Planned Treatment Duration: 07/08/24     Planned Interventions: Education (Patient/Family/Caregiver), Gait training, Therapeutic Exercise, Therapeutic Activity     Goals:   Patient and Family Goals: Return home. Reduce pain in back and with walking.     SHORT GOAL #1: Pt will perform STS transfer with LRAD, mod-I               Time Frame : 1 week  SHORT GOAL #2: Pt will ambulate 100' with LRAD, SBA, and consistent quality of gait throughout.              Time Frame : 2 weeks                                                  Prognosis:  Fair  Positive Indicators: CLOF, willing to work with therapy  Barriers to Discharge: Decreased caregiver support, Endurance deficits, Functional strength deficits, Gait instability, Pain     SUBJECTIVE  Communication Preference: Verbal        Patient reports: Agreeable to PT.  I'll try.  Pain Comments: Pain in back at T12 fx 2/10 at rest, 9/10 with mobility; pain in head 4/10. RN notified.        Prior Functional Status: Pt reports recurrent history of falls on  Labor Day (03/08/24), Thanksgiving (06/03/2024), and two more since 11/27. Mod-I ADLs with increased time and use of AD but experiences significant fatigue even with use of shower bench and rollator for seated rest breaks. Husband does all cooking, cleaning, and shopping (no driving for either of them). Ambulates with rollator in household; does not go into community anymore due to pain with mobility.  Living Situation  Living Environment: House  Lives With: Spouse, Family (Husband, step-daugther and son-in-law)  Home Living: One level home, Able to Live on main level with bedroom/bathroom, Level entry, Walk-in shower, Grab bars in shower, Grab bars around toilet, Ramped entrance, Comfort height commode seat, Built-in shower seat, Hand-held shower hose  Caregiver Identified?: Yes  Caregiver Availability: 24 hours  Caregiver Ability: Supervision (Husband is in a W/C)   Caregiver Identified?: Yes   Equipment available at home: Rollator, Hand-held shower hose        Past Medical History[1]         Social History     Tobacco Use    Smoking status: Never     Passive exposure: Past    Smokeless tobacco: Never   Substance Use Topics    Alcohol use: No       Past Surgical History[2]          Family History[3]     Allergies: Shellfish containing products, Glipizide , and Lisinopril                   Objective Findings:  Precautions / Restrictions  Precautions: Non-applicable (T12 compression fx.)  Precautions / Restrictions comments: Use of log roll technique for bed mobility.        Equipment / Environment: Vascular access (PIV, TLC, Port-a-cath, PICC)     Vitals/Orthostatics : Orthostatics. Supine, at rest: BP 108/44, HR 80. Seated EOB: BP 105/55, HR 81. Standing with RW at EOB: BP 105/44, HR 84. Pt asymptomatic throughout session.     Cognition: WFL, Follows 2-step commands  Cognition comment: Alert and pleasant.  Visual/Perception: Wears glasses for reading only  Hearing: No deficit identified     Skin Inspection: Intact where visualized     Upper Extremities  UE ROM: Right WFL, Left WFL  UE Strength: Right Impaired/Limited, Left Impaired/Limited  RUE Strength Impairment: Reduced strength  LUE Strength Impairment: Reduced strength    Lower Extremities  LE ROM: Right WFL, Left Impaired/Limited  LLE ROM Impairment: Pain with movement, Limited AROM  LE Strength: Right Impaired/Limited, Left Impaired/Limited  RLE Strength Impairment: Pain with movement  LLE Strength Impairment: Pain with movement  LE comment: Pain in back with AROM. Weakness in BLE, LLE>RLE.          Sensation: WFL  Posture: Rounded shoulders, Forward head    Static Sitting-Level of Assistance: Supervision  Dynamic Sitting-Level of Assistance: Supervision  Sitting Balance comments: Seated EOB. Able to doff clothing and don gown mod-I with no concerns of instability.    Static Standing-Level of Assistance: Contact guard  Dynamic Standing - Level of Assistance: Contact guard  Standing Balance comments: EOB with RW CGA.      Bed Mobility        Supine to Sit assistance level: Modified independent, requires aide device or extra time  Sit to Supine assistance level: Modified independent, requires aide device or extra time  Bed Mobility comments: Use of log-roll technique and bed rails mod-I.    Transfers  Sit to Stand assistance level: Contact guard assist, steadying assist     Transfer  comments: x1 EOB with RW CGA. x3 EOB, no AD CGA. x1 from [facility], no AD CGA.    Ambulation  Level of Assistance: Contact guard assist, steadying assist  Assistive Device: Rolling walker   Distance Ambulated (ft): 50 ft   Ambulation comments: Amb in room with RW CGA. Reciprocal gait pattern with slow speed at first. Transitions to step-to pattern with RLE leading, B decreased step length, and decreased SL stance time on LLE due to pain in back. Evident increased effort for advancement and management of LLE throughout ambulation trial.       Stairs:            Endurance: Fair. Limited by pain and weakness.    Patient at end of session: All needs in reach, Alarm activated, In bed, Notified Nurse           AM-PAC 5 click  Help currently need turning over In bed?: None - Modified Independent/Independent  Help currently needed sitting down/standing up from chair with arms? : A Little - Minimal/Contact Guard Assist/Supervision  Help currently needed moving from supine to sitting on edge of bed?: None - Modified Independent/Independent  Help currently needed moving to and from bed from wheelchair?: A Little - Minimal/Contact Guard Assist/Supervision  Help currently needed walking in a hospital room?: A Little - Minimal/Contact Guard Assist/Supervision      Basic Mobility Score 5 click: 17    Score (in points): % of Functional Impairment, Limitation, Restriction  5: 100% impaired, limited, restricted  6-7: At least 80%, but less than 100% impaired, limited restricted  8-11: At least 60%, but less than 80% impaired, limited restricted  12-16: At least 40%, but less than 60% impaired, limited restricted  17-18: At least 20%, but less than 40% impaired, limited restricted  19: At least 1%, but less than 20% impaired, limited restricted  20: 0% impaired, limited restricted    'AM-PAC' forms are Copyright protected by [employer]       A student was present and participated in the care. Licensed/Credentialed therapist was physically present and immediately available to direct and supervise tasks that were related to patient management. The direction and supervision was continuous throughout the time these tasks were performed.      Physical Therapy Session Duration  PT Individual [mins]: 51      I attest that I have reviewed the above information.  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    Obesity     Prediabetes    [2]   Past Surgical History:  Procedure Laterality Date    ABDOMINAL WALL MESH  REMOVAL  1997, 2005    Placement, 1997, removal and replacement 2005    BLADDER SURGERY      bladder tact X2    BREAST BIOPSY Right 04/2013    benign    BREAST EXCISIONAL BIOPSY Right 05/2013    benign    BREAST SURGERY Right     precancerous spot removed  CHOLECYSTECTOMY      HYSTERECTOMY      IR EMBOLIZATION VENOUS OTHER THAN HEMORRHAGE  02/17/2023    IR EMBOLIZATION VENOUS OTHER THAN HEMORRHAGE 02/17/2023 Babara Lash, MD IMG VIR [facility]    PR COLONOSCOPY FLX DX W/COLLJ SPEC WHEN PFRMD N/A 08/15/2023    Procedure: COLONOSCOPY, FLEXIBLE, PROXIMAL TO SPLENIC FLEXURE; DIAGNOSTIC, W/WO COLLECTION SPECIMEN BY BRUSH OR WASH;  Surgeon: Charlanne Kipper, MD;  [facility];  Service: Gastrointestinal    PR COLSC FLX W/RMVL OF TUMOR POLYP LESION SNARE TQ N/A 10/14/2016    Procedure: COLONOSCOPY FLEX; W/REMOV TUMOR/LES BY SNARE;  Surgeon: Eleanor Dewey Sorrel, MD;  Location: [facility] [place];  Service: Gastroenterology    PR COLSC FLX W/RMVL OF TUMOR POLYP LESION SNARE TQ N/A 01/15/2023    Procedure: COLONOSCOPY FLEX; W/REMOV TUMOR/LES BY SNARE;  Surgeon: Fae Alto, MD;  [facility]: [facility];  Service: Gastroenterology    PR GI IMAG INTRALUMINAL ESOPHAGUS-ILEUM W/I&R N/A 08/15/2023    Procedure: GI VIDEO TRACT IMAGE INTRALUMINAL (EG, CAPSULE ENDOSCOPY), ESOPHAGUS VIA ILEUM, PHYSICIAN INTERPRETATION & REPORT;  Surgeon: Charlanne Kipper, MD;  [facility];  Service: Gastrointestinal    PR PERC CLOS,CONG INTERATRIAL COMMUN W/IMPL N/A 09/12/2022    Procedure: ASD closure;  Surgeon: Pia Sharper, MD;  Location: [facility];  Service: Cardiology    PR UPPER GI ENDOSCOPY,BIOPSY N/A 01/15/2023    Procedure: UGI ENDOSCOPY; WITH BIOPSY, SINGLE OR MULTIPLE;  Surgeon: Fae Alto, MD;  [facility];  Service: Gastroenterology    PR UPPER GI ENDOSCOPY,DIAGNOSIS N/A 08/14/2023    Procedure: UGI ENDO, INCLUDE ESOPHAGUS, STOMACH, & DUODENUM &/OR JEJUNUM; DX W/WO COLLECTION SPECIMN, BY BRUSH OR WASH;  Surgeon: Charlanne Kipper, MD;  [facility];  Service: Gastrointestinal    SKIN BIOPSY     [3]   Family History  Problem Relation Age of Onset    Heart disease Mother     Hypertension Mother     COPD Mother     Heart disease Father     Kidney disease Father     Allergies Father     Gout Father     Diabetes Father     Allergies Other         FAMILY H/O    Coronary artery disease Other         FAMILY H/O    Kidney failure Other         FAMILY H/O    No Known Problems Sister     No Known Problems Daughter     No Known Problems Maternal Grandmother     No Known Problems Maternal Grandfather     No Known Problems Paternal Grandmother     No Known Problems Paternal Grandfather     Alcohol abuse Maternal Uncle     Birth defects Son     Birth defects Son     BRCA 1/2 Neg Hx     Breast cancer Neg Hx     Cancer Neg Hx     Colon cancer Neg Hx     Endometrial cancer Neg Hx     Ovarian cancer Neg Hx     Substance Abuse Disorder Neg Hx     Mental illness Neg Hx

## 2024-06-24 NOTE — H&P (Signed)
 Family Medicine Inpatient Service History & Physical    Assessment & Plan:   Denise York is a 58 y.o. female whose presentation is complicated by decompensated cirrhosis, hyperlipidemia, portal hypertension, pruritus, type 2 diabetes, and depression presenting to [facility] Emergency Department with recurrent falls and anemia.     Principal Problem:    Anemia  Active Problems:    Insomnia    Itching    Type 2 diabetes mellitus without complications (CMS-HCC)    Decompensated cirrhosis    (CMS-HCC)    Portal hypertension    (CMS-HCC)    Nausea and vomiting    Recurrent falls    Hypokalemia        Active Problems    # Anemia  Patient found to be anemic at 5.8 which is down from 6.5 on 06/22/2024.  Patient denies any signs of upper GI bleed or lower GI bleed. Patient received 1 unit of blood in the ED which increased hemoglobin up to 6.5.  GI was consulted and deemed pt appropriate for [facility]. Patient's MCV has been chronically low, which raises concern of possibly severe iron deficiency anemia being the likely source of drop in hemoglobin. Her ferritin in the last 4 months were 8.9 at 7.8. Recheck today was 9.2. Will transfuse 1u of pRBCs and work up other causes of anemia. Will also obtain workup to rule out hemolysis.  - Transfuse additional 1u of pRBCs  - Daily CBC  - Consider IV iron infusion  - Follow up folate and Vit B12  - Follow up hemolysis labs DAT, Reticulocytes, haptoglobin, LDH, direct bilirubin, and smear    # Recurrent Falls - Weakness   Likely due to chronic anemia.  Patient also endorses poor appetite recently which could be secondary to Ozempic . Patient denies any LOC but endorses significant bleeding from scalp laceration 1.5 weeks ago. Lesion has since healed.  Patient also has also been having recurrent emesis 2/2 Ozempic  which may also be attributing to weakness.  - PT/OT eval and treat  - Nutrition consult    # T-Spine fracture  Patient has acute compression fracture of T12 with less than 25% of height loss. Injury is likely secondary to 1 of recent falls. Neurosurgery consulted, no need for surgical intervention. We will attempt to control pain with multimodal agents.  - PT/OT as above  - Lidocaine  patch q12h  - Tylenol  500 mg q8h PRN for mild pain  - Oxycodone 5 mg q4h PRN for severe pain    # Hypokalemia  Potassium low at 3.3. Likely iso poor PO intake and frequent emesis.   - Potassium 40 mEq PO given  - Daily BMP    Thrombocytopenia  Initially 73, but has trended down to 44.   - HOLD medical DVT prophylaxis  - CTM to with daily CBC  - Follow up hemolysis labs  - Transfuse Plt < 30    #Insomnia  Melatonin 3 mg nightly PRN    Chronic Problems    # Decompensated Cirrhosis - Ascites  MELD score of 16.  A&O x 3, no acute concerns for worsening cirrhosis.  Patient recently stopped lactulose  1 week ago due to frequent bowel movements.  Given patient's history we will restart to prevent encephalopathy.  - Continue lactulose  40 g 3 times daily  - HOLD Bumex  iso hypotension  - HOLD spironolactone  iso hypotension    # Portal HTN - Esophageal varices  Carvedilol  recently stopped by Gastroenterologist.  Denies hematemesis.  - CTM    #  Pruritus  - Continue naltrexone  50 mg    # HLD  - Continue atorvastatin  20 mg daily    # T2DM  - Continue metformin  500 mg daily    # Depression  - Continue Zoloft  100 mg daily    The patient's presentation is complicated by the following clinically significant conditions requiring additional evaluation and treatment: - Disorders of electrolytes, volume status, and acid/base status: - Hypokalemia and - Hypotension requiring further investigation, treatment, or monitoring  - overweight/obese/morbidly obese: morbidly obese POA affecting nursing needs, DME, and attention to weight based medication dosing -- Body mass index is 33.3 kg/m?? (pended).   - Anemia requiring at least daily CBC for further monitoring       Issues Impacting Complexity of Management:  -The patient is at high risk of complications from decompensated cirrhosis      Medical Decision Making: Reviewed records from the following unique sources  Epic notes.      Checklist:  Diet: Regular Diet  DVT PPx: SCDs  Code Status: Full Code  Dispo: Patient appropriate for Observation based on expectation of ongoing need for hospitalization less than two midnights and/or low intensity of services provided    Team Contact Information:   Primary Team: [facility]  Primary Resident: Langley MARLA Molt, DO  Resident's Pager: Barbar Suellyn Seip [phone])    Chief Concern:   Anemia    Subjective:   Denise York is a 59 y.o. female with pertinent PMHx of decompensated cirrhosis, hyperlipidemia, portal hypertension, pruritus, type 2 diabetes, and depression presenting with recurrent falls and anemia.    HPI:  History of Present Illness  She has experienced multiple falls recently, including incidents on Thanksgiving Day at her sister's house and the following Sunday at her own home. She attributes these falls to a loss of balance. During one of these falls, she hit her head, resulting in significant bleeding that required her to cover her pillowcase to catch the blood. This incident occurred approximately twelve days ago.    No bleeding from other sites, including no melena, hematochezia, hemoptysis, or hematemesis. She sometimes vomits after eating in the morning before taking her medication, but there is no blood in the vomit.    She reports back pain and intermittent abdominal pain, which she describes as 'comes and goes'.    Her medication regimen includes atorvastatin , Bumex , metformin , naltrexone , omeprazole , Ozempic , rifaximin , sertraline , and spironolactone . She has not taken her 'life pills' since her fall due to difficulty getting to the bathroom. She uses pill packs to manage her medications.    No shortness of breath or chest pain.        Pertinent Surgical Hx  Past Surgical History[1]     Pertinent Family Hx  Family History[2]    Pertinent Social Hx   Social History[3]      Allergies  Shellfish containing products, Glipizide , and Lisinopril     I reviewed the Medication List. The current list is Accurate  Prior to Admission medications   Medication Dose, Route, Frequency   atorvastatin  (LIPITOR ) 20 MG tablet 20 mg, Oral, Daily (standard)   loratadine  (CLARITIN ) 10 mg tablet 10 mg, Oral, Daily (standard)   metFORMIN  (GLUCOPHAGE -XR) 500 MG 24 hr tablet TAKE 1 TABLET BY MOUTH EVERY EVENING with dinner   omeprazole  (PRILOSEC) 20 MG capsule TAKE 1 CAPSULE BY MOUTH TWICE DAILY BEFORE MEALS   blood sugar diagnostic (GLUCOSE BLOOD) Strp Use to test blood sugar daily   blood-glucose meter  kit Use to test blood sugar DAILY   bumetanide  (BUMEX ) 1 MG tablet 2 mg, Oral, Daily (standard)   cholecalciferol, vitamin D3 25 mcg, 1,000 units,, 1,000 unit (25 mcg) tablet 50 mcg, Daily (standard)   DAILY-VITE, WITH FOLIC ACID, 400 mcg Tab tablet 1 tablet, Daily (standard)   inhalational spacing device (AEROCHAMBER MV) Spcr Use as directed with inhalers   lactulose  (CEPHULAC ) 20 gram packet Mix and take 2 packets (40 g total) by mouth Three (3) times a day.   lancets Misc Use to test blood sugar daily   naltrexone  (DEPADE) 50 mg tablet 50 mg, Oral, Daily (standard)   OZEMPIC  2 mg/dose (8 mg/3 mL) PnIj INJECT 2MG  under the skin once weekly (every 7 days)   rifAXIMin  (XIFAXAN ) 550 mg Tab 550 mg, Oral, 2 times a day (standard)   sertraline  (ZOLOFT ) 100 MG tablet 100 mg, Oral, Daily (standard)   spironolactone  (ALDACTONE ) 100 MG tablet 200 mg, Oral, Daily (standard)       Designated Healthcare Decision Maker:  Ms. Dura currently has decisional capacity for healthcare decision-making and is able to designate a surrogate healthcare decision maker. Ms. Wos designated healthcare decision maker(s) is Denise York (the patient's spouse) as denoted by stated patient preference.    Objective:   Physical Exam:  Temp:  [36.7 ??C (98 ??F)-36.9 ??C (98.4 ??F)] 36.8 ??C (98.3 ??F)  Pulse:  [75-80] 78  SpO2 Pulse:  [75-84] 84  Resp:  [15-16] 16  BP: (89-130)/(44-71) 122/52  SpO2:  [93 %-100 %] 98 %    Gen: NAD, converses appropriately  Eyes: Sclera anicteric, EOMI grossly normal   HENT: Atraumatic, normocephalic  Heart: RRR  Lungs: Normal work of breathing on room air  Abdomen: Soft, tender to palpation in right upper quadrant tenderness to deep palpation in left lower quadrant  Extremities: Nonpitting edema in lower extremities  Neuro: Grossly symmetric, non-focal    Skin:  No rashes, lesions on clothed exam  Psych: Alert, oriented x 3      Langley Joshua HAS, MA   Resident Physician, PGY-2  [facility]         [1]   Past Surgical History:  Procedure Laterality Date    ABDOMINAL WALL MESH  REMOVAL  1997, 2005    Placement, 1997, removal and replacement 2005    BLADDER SURGERY      bladder tact X2    BREAST BIOPSY Right 04/2013    benign    BREAST EXCISIONAL BIOPSY Right 05/2013    benign    BREAST SURGERY Right     precancerous spot removed    CHOLECYSTECTOMY      HYSTERECTOMY      IR EMBOLIZATION VENOUS OTHER THAN HEMORRHAGE  02/17/2023    IR EMBOLIZATION VENOUS OTHER THAN HEMORRHAGE 02/17/2023 Babara Lash, MD IMG VIR [facility]    PR COLONOSCOPY FLX DX W/COLLJ SPEC WHEN PFRMD N/A 08/15/2023    Procedure: COLONOSCOPY, FLEXIBLE, PROXIMAL TO SPLENIC FLEXURE; DIAGNOSTIC, W/WO COLLECTION SPECIMEN BY BRUSH OR WASH;  Surgeon: Charlanne Kipper, MD;  Location: [facility] [place];  Service: Gastrointestinal    PR COLSC FLX W/RMVL OF TUMOR POLYP LESION SNARE TQ N/A 10/14/2016    Procedure: COLONOSCOPY FLEX; W/REMOV TUMOR/LES BY SNARE;  Surgeon: Eleanor Dewey Sorrel, MD;  Location: [facility] [place];  Service: Gastroenterology    PR COLSC FLX W/RMVL OF TUMOR POLYP LESION SNARE TQ N/A 01/15/2023    Procedure: COLONOSCOPY FLEX; W/REMOV TUMOR/LES BY SNARE;  Surgeon:  Fae Alto, MD;  Location: [facility];  Service: Gastroenterology    PR GI IMAG INTRALUMINAL ESOPHAGUS-ILEUM W/I&R N/A 08/15/2023    Procedure: GI VIDEO TRACT IMAGE INTRALUMINAL (EG, CAPSULE ENDOSCOPY), ESOPHAGUS VIA ILEUM, PHYSICIAN INTERPRETATION & REPORT;  Surgeon: Charlanne Kipper, MD;  Location: [facility];  Service: Gastrointestinal    PR PERC CLOS,CONG INTERATRIAL COMMUN W/IMPL N/A 09/12/2022    Procedure: ASD closure;  Surgeon: Pia Sharper, MD;  Location: [facility] CATH;  Service: Cardiology    PR UPPER GI ENDOSCOPY,BIOPSY N/A 01/15/2023    Procedure: UGI ENDOSCOPY; WITH BIOPSY, SINGLE OR MULTIPLE;  Surgeon: Fae Alto, MD;  Location: [facility];  Service: Gastroenterology    PR UPPER GI ENDOSCOPY,DIAGNOSIS N/A 08/14/2023    Procedure: UGI ENDO, INCLUDE ESOPHAGUS, STOMACH, & DUODENUM &/OR JEJUNUM; DX W/WO COLLECTION SPECIMN, BY BRUSH OR WASH;  Surgeon: Charlanne Kipper, MD;  [facility]: [facility];  Service: Gastrointestinal    SKIN BIOPSY     [2]   Family History  Problem Relation Age of Onset    Heart disease Mother     Hypertension Mother     COPD Mother     Heart disease Father     Kidney disease Father     Allergies Father     Gout Father     Diabetes Father     Allergies Other         FAMILY H/O    Coronary artery disease Other         FAMILY H/O    Kidney failure Other         FAMILY H/O    No Known Problems Sister     No Known Problems Daughter     No Known Problems Maternal Grandmother     No Known Problems Maternal Grandfather     No Known Problems Paternal Grandmother     No Known Problems Paternal Grandfather     Alcohol abuse Maternal Uncle     Birth defects Son     Birth defects Son     BRCA 1/2 Neg Hx     Breast cancer Neg Hx     Cancer Neg Hx     Colon cancer Neg Hx     Endometrial cancer Neg Hx     Ovarian cancer Neg Hx     Substance Abuse Disorder Neg Hx     Mental illness Neg Hx    [3]   Social History  Socioeconomic History    Marital status: Married     Spouse name: None    Number of children: None    Years of education: None Highest education level: None   Tobacco Use    Smoking status: Never     Passive exposure: Past    Smokeless tobacco: Never   Vaping Use    Vaping status: Never Used   Substance and Sexual Activity    Alcohol use: No    Drug use: No    Sexual activity: Not Currently     Birth control/protection: Abstinence, None   Other Topics Concern    Do you use sunscreen? Yes    Excessive sun exposure? Yes   Social History Narrative    2 births. No living children. Married new husband 2014.         02/21/2017        [facility] Components:        Family, social, cultural characteristics: social support includes best friend .  Patient has the following communication needs: none, per patient     Health Literacy: How confident are you that you understand your health issues/concerns, can participate in your care, and manage your care along with your physician: confident.    Behaviors Affecting Health: none, per patient    Family history of mental health illness and/or substance abuse: asked patient/parent and none disclosed.    Have you been seen by any medical provider that we have not referred you to since your last visit ? No    Discussed a Living Will with the patient and uyz:Ejupzwu is under the age of 39.      Social Drivers of Health     Food Insecurity: No Food Insecurity (11/27/2023)    Hunger Vital Sign     Worried About Running Out of Food in the Last Year: Never true     Ran Out of Food in the Last Year: Never true   Tobacco Use: Low Risk (06/23/2024)    Patient History     Smoking Tobacco Use: Never     Smokeless Tobacco Use: Never     Passive Exposure: Past   Transportation Needs: No Transportation Needs (11/27/2023)    PRAPARE - Transportation     Lack of Transportation (Medical): No     Lack of Transportation (Non-Medical): No   Alcohol Use: Not At Risk (08/14/2023)    Alcohol Use     How often do you have a drink containing alcohol?: Never     How many drinks containing alcohol do you have on a typical day when you are drinking?: 1 - 2     How often do you have 5 or more drinks on one occasion?: Never   Housing: Low Risk (11/27/2023)    Housing     Within the past 12 months, have you ever stayed: outside, in a car, in a tent, in an overnight shelter, or temporarily in someone else's home (i.e. couch-surfing)?: No     Are you worried about losing your housing?: No   Utilities: Low Risk (11/27/2023)    Utilities     Within the past 12 months, have you been unable to get utilities (heat, electricity) when it was really needed?: No   Interpersonal Safety: Not At Risk (06/24/2024)    Interpersonal Safety     Unsafe Where You Currently Live: No     Physically Hurt by Anyone: No     Abused by Anyone: No

## 2024-06-24 NOTE — Consults (Signed)
 Care Management  Initial Transition Planning Assessment  CM spoke with patient at bedside to explain role and transition of care. Patient lives with spouse and step dtr in private home with ramp to enter and resides on main floor. Patient was fully independent prior to hospitalization. No care giver or home care services identified. DMEs in home include [facility]. Step dtr will transport patient home when ready for discharge.     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  Past Surgical History:   has a past surgical history that includes Breast surgery (Right); Hysterectomy; Bladder surgery; Cholecystectomy; Abdominal wall mesh  removal (1997, 2005); Skin biopsy; pr colsc flx w/rmvl of tumor polyp lesion snare tq (N/A, 10/14/2016); Breast biopsy (Right, 04/2013); Breast excisional biopsy (Right, 05/2013); pr perc clos,cong interatrial commun w/impl (N/A, 09/12/2022); pr upper gi endoscopy,biopsy (N/A, 01/15/2023); pr colsc flx w/rmvl of tumor polyp lesion snare tq (N/A, 01/15/2023); IR Embolization - Venous Non-Hemorrhage (02/17/2023); pr upper gi endoscopy,diagnosis (N/A, 08/14/2023); pr colonoscopy flx dx w/collj spec when pfrmd (N/A, 08/15/2023); and pr gi imag intraluminal esophagus-ileum w/i&r (N/A, 08/15/2023).   Previous admit date: 08/13/2023    Primary Insurance- Payor: STATE HE[employer] ANIMATOR / Plan: STATE HEALTH PLAN AETNA / Product Type: *No Product type* /   Secondary Insurance - None  Prescription Coverage -   Preferred Pharmacy - [facility] - [place], [place] - [address]  [id number] - [place], [place] - [address] [place] [address]    Transportation home: [employer] / Social Worker assessed the patient by : In person interview with family  Orientation Level: Oriented X4  Functional level prior to admission: Independent    Contact/Decision Maker  Extended Emergency Contact Information  Primary Emergency Contact: Calderon,Debbie  Mobile Phone: [phone]  Relation: Sister  Secondary Emergency Contact: Trumpower,Walter   [place]  Mobile Phone: [phone]  Relation: Spouse    Legal Next of Kin / Guardian / POA / Advance Directives       Advance Directive (Medical Treatment)  Does patient have an advance directive covering medical treatment?: Patient does not have advance directive covering medical treatment.    Health Care Decision Maker [HCDM] (Medical & Mental Health Treatment)  Healthcare Decision Maker: HCDM documented in the HCDM/Contact Info section.  Information offered on HCDM, Medical & Mental Health advance directives:: Patient given information.         Readmission Information    Have you been hospitalized in the last 30 days?: No    Did the following happen with your discharge?        Patient Information  Lives with: Spouse/significant other, Children    Type of Residence: Private residence             Support Systems/Concerns: Children, Spouse  Responsibilities/Dependents at home?: No    Home Care services in place prior to admission?: No          Outpatient/Community Resources in place prior to admission: Clinic       Equipment Currently Used at Home: commode chair       Currently receiving outpatient dialysis?: No       Financial Information       Need for financial assistance?: No       Social Drivers of Health  Social Drivers of Health were addressed in provider documentation.  Please refer to patient history.  Social Drivers of Health     Food Insecurity: No Food Insecurity (06/24/2024)    Hunger Vital Sign     Worried About Running Out of Food in the Last Year: Never true     Ran Out of Food in the Last Year: Never true   Tobacco Use: Low Risk (06/24/2024)    Patient History     Smoking Tobacco Use: Never     Smokeless Tobacco Use: Never     Passive Exposure: Past   Transportation Needs: No Transportation Needs (06/24/2024)    PRAPARE - Transportation     Lack of Transportation (Medical): No     Lack of Transportation (Non-Medical): No   Alcohol Use: Not At Risk (08/14/2023)    Alcohol Use     How often do you have a drink containing alcohol?: Never     How many drinks containing alcohol do you have on a typical day when you are drinking?: 1 - 2     How often do you have 5 or more drinks on one occasion?: Never   Housing: Low Risk (06/24/2024)    Housing     Within the past 12 months, have you ever stayed: outside, in a car, in a tent, in an overnight shelter, or temporarily in someone else's home (i.e. couch-surfing)?: No     Are you worried about losing your housing?: No   Physical Activity: Not on file   Utilities: Low Risk (06/24/2024)    Utilities     Within the past 12 months, have you been unable to get utilities (heat, electricity) when it was really needed?: No   Stress: Not on file   Interpersonal Safety: Not At Risk (06/24/2024)    Interpersonal Safety     Unsafe Where You Currently Live: No     Physically Hurt by Anyone: No     Abused by Anyone: No   Substance Use: Not on file (05/18/2023)   Intimate Partner Violence: Not on file   Social Connections: Not on file   Financial Resource Strain: Low Risk (06/24/2024)    Overall Financial Resource Strain (CARDIA)     Difficulty of Paying Living Expenses: Not very hard   Health Literacy: Not on file   Internet Connectivity: Not on file       Complex Discharge Information    Is patient identified as a difficult/complex discharge?: No      Interventions:       Discharge Needs Assessment  Concerns to be Addressed: discharge planning    Clinical Risk Factors: New Diagnosis    Barriers to taking medications: No    Prior overnight hospital stay or ED visit in last 90 days: No              Anticipated Changes Related to Illness: none    Equipment Needed After Discharge: other (see comments) (TBD)  Discharge Facility/Level of Care Needs: other (see comments) (TBD)    Readmission  Risk of Unplanned Readmission Score:  %  Predictive Model Details   No score data available for [facility] Risk of Unplanned Readmission     Readmitted Within the Last 30 Days? (No if blank)   Patient at risk for readmission?: No    Discharge Plan  Screen findings are: Discharge planning needs identified or anticipated (Comment). (TBD)    Expected Discharge Date:     Expected Transfer from Critical Care:  (N/A)    Quality data for continuing care services shared with patient and/or representative?: Yes  Patient and/or family were provided with choice of facilities / services that are available and appropriate to meet post hospital care needs?: Yes   List choices in order highest to lowest preferred, if applicable. : No pref    Initial Assessment complete?: Yes

## 2024-06-24 NOTE — ED Progress Note (Signed)
 Disposition: Admit    Final diagnoses:   Anemia, unspecified type (Primary)       Assumed care of patient from the previous team.     ED Course as of 06/24/24 0328   Wed Jun 23, 2024   2332 Assumed care of patient from previous team.  Please see their note for full details.  In short, patient with trauma paged for admission for acute blood loss anemia in setting of scalp lac.        Vitals:    06/24/24 0214   BP: 122/52   Pulse: 78   Resp: 16   Temp: 36.8 ??C (98.3 ??F)   SpO2: 98%       Destyne Goodreau R Maaz Spiering, DO  3:28 AM

## 2024-06-25 LAB — BASIC METABOLIC PANEL
ANION GAP: 17 mmol/L — ABNORMAL HIGH (ref 5–14)
BLOOD UREA NITROGEN: 14 mg/dL (ref 9–23)
BUN / CREAT RATIO: 12
CALCIUM: 9.2 mg/dL (ref 8.7–10.4)
CHLORIDE: 104 mmol/L (ref 98–107)
CO2: 25 mmol/L (ref 20.0–31.0)
CREATININE: 1.13 mg/dL — ABNORMAL HIGH (ref 0.55–1.02)
EGFR CKD-EPI (2021) FEMALE: 57 mL/min/1.73m2 — ABNORMAL LOW (ref >=60)
GLUCOSE RANDOM: 135 mg/dL (ref 70–179)
POTASSIUM: 3.5 mmol/L (ref 3.4–4.8)
SODIUM: 146 mmol/L — ABNORMAL HIGH (ref 135–145)

## 2024-06-25 LAB — HEMOGLOBIN A1C
ESTIMATED AVERAGE GLUCOSE: 105 mg/dL
HEMOGLOBIN A1C: 5.3 % (ref 4.8–5.6)

## 2024-06-25 LAB — CBC
HEMATOCRIT: 25.4 % — ABNORMAL LOW (ref 34.0–44.0)
HEMOGLOBIN: 8.3 g/dL — ABNORMAL LOW (ref 11.3–14.9)
MEAN CORPUSCULAR HEMOGLOBIN CONC: 32.5 g/dL (ref 32.0–36.0)
MEAN CORPUSCULAR HEMOGLOBIN: 24.7 pg — ABNORMAL LOW (ref 25.9–32.4)
MEAN CORPUSCULAR VOLUME: 75.8 fL — ABNORMAL LOW (ref 77.6–95.7)
MEAN PLATELET VOLUME: 11 fL — ABNORMAL HIGH (ref 6.8–10.7)
PLATELET COUNT: 52 10*9/L — ABNORMAL LOW (ref 150–450)
RED BLOOD CELL COUNT: 3.35 10*12/L — ABNORMAL LOW (ref 3.95–5.13)
RED CELL DISTRIBUTION WIDTH: 20.9 % — ABNORMAL HIGH (ref 12.2–15.2)
WBC ADJUSTED: 5.8 10*9/L (ref 3.6–11.2)

## 2024-06-25 MED ORDER — FERROUS SULFATE 325 MG (65 MG IRON) TABLET
ORAL_TABLET | ORAL | 1 refills | 90.00000 days | Status: CP
Start: 2024-06-25 — End: ?
  Filled 2024-06-25: qty 45, 90d supply, fill #0

## 2024-06-25 MED ADMIN — cholecalciferol (vitamin D3 25 mcg (1,000 units)) tablet 50 mcg: 50 ug | ORAL | @ 15:00:00 | Stop: 2024-06-25

## 2024-06-25 MED ADMIN — melatonin tablet 3 mg: 3 mg | ORAL | @ 02:00:00

## 2024-06-25 MED ADMIN — multivitamins, therapeutic with minerals tablet 1 tablet: 1 | ORAL | @ 15:00:00 | Stop: 2024-06-25

## 2024-06-25 MED ADMIN — lactulose (CEPHULAC) packet 40 g: 40 g | ORAL | @ 15:00:00 | Stop: 2024-06-25

## 2024-06-25 MED ADMIN — atorvastatin (LIPITOR) tablet 20 mg: 20 mg | ORAL | @ 15:00:00 | Stop: 2024-06-25

## 2024-06-25 MED ADMIN — naltrexone (DEPADE) tablet 50 mg: 50 mg | ORAL | @ 15:00:00 | Stop: 2024-06-25

## 2024-06-25 MED ADMIN — pantoprazole (Protonix) EC tablet 20 mg: 20 mg | ORAL | @ 15:00:00 | Stop: 2024-06-25

## 2024-06-25 MED ADMIN — sertraline (ZOLOFT) tablet 100 mg: 100 mg | ORAL | @ 15:00:00 | Stop: 2024-06-25

## 2024-06-25 MED ADMIN — rifAXIMin (XIFAXAN) tablet 550 mg: 550 mg | ORAL | @ 15:00:00 | Stop: 2024-06-25

## 2024-06-25 MED ADMIN — rifAXIMin (XIFAXAN) tablet 550 mg: 550 mg | ORAL | @ 02:00:00 | Stop: 2024-06-27

## 2024-06-25 MED ADMIN — lidocaine (ASPERCREME) 4 % 1 patch: 1 | TRANSDERMAL | @ 15:00:00 | Stop: 2024-06-25

## 2024-06-25 MED ADMIN — acetaminophen (TYLENOL) tablet 500 mg: 500 mg | ORAL | @ 15:00:00 | Stop: 2024-06-25

## 2024-06-25 NOTE — Plan of Care (Signed)
 Shift Summary  Melatonin was administered PRN for sleep during the shift.   Anti-embolism devices were applied and remained on throughout the shift.   Fall reduction interventions, including low bed and scheduled toileting, were consistently maintained.   Pain was assessed at 0 and patient was able to feed herself.   Patient demonstrated generalized weakness and limited movement in the left lower extremity.     Absence of Hospital-Acquired Illness or Injury: Anti-embolism devices remained on and below the knee throughout the shift, and fall reduction interventions were consistently maintained; no new hospital-acquired issues were documented.     Optimal Comfort and Wellbeing: Pain was consistently rated at 0, and melatonin was administered for sleep; patient declined bath/shower.     Absence of Fall and Fall-Related Injury: Fall reduction program was maintained with low bed, hourly visual checks, and scheduled toileting; no falls or injuries were documented.     Improved Ability to Complete Activities of Daily Living: Patient was able to feed herself and followed commands, but demonstrated generalized weakness and limited movement in the left lower extremity.     Anemia Symptom Improvement: Generalized weakness was noted, and lactulose  was refused; no other anemia-specific interventions or changes were documented.       Problem: Adult Inpatient Plan of Care  Goal: Absence of Hospital-Acquired Illness or Injury  Outcome: Shift Focus  Intervention: Identify and Manage Fall Risk  Recent Flowsheet Documentation  Taken 06/25/2024 0209 by Orlando Wes CROME, RN  Safety Interventions:   fall reduction program maintained   low bed  Taken 06/25/2024 0014 by Orlando Wes CROME, RN  Safety Interventions:   fall reduction program maintained   low bed  Taken 06/24/2024 2200 by Orlando Wes CROME, RN  Safety Interventions:   fall reduction program maintained   low bed  Taken 06/24/2024 2000 by Orlando Wes CROME, RN  Safety Interventions:   fall reduction program maintained   low bed  Intervention: Prevent Skin Injury  Recent Flowsheet Documentation  Taken 06/25/2024 0209 by Orlando Wes CROME, RN  Positioning for Skin: Supine/Back  Taken 06/25/2024 0014 by Orlando Wes CROME, RN  Positioning for Skin: Supine/Back  Taken 06/24/2024 2200 by Orlando Wes CROME, RN  Positioning for Skin: Right  Taken 06/24/2024 2047 by Orlando Wes CROME, RN  Positioning for Skin: Supine/Back  Taken 06/24/2024 2000 by Orlando Wes CROME, RN  Positioning for Skin: Supine/Back  Intervention: Prevent and Manage VTE (Venous Thromboembolism) Risk  Recent Flowsheet Documentation  Taken 06/25/2024 0209 by Orlando Wes CROME, RN  Anti-Embolism Device Type: SCD, Knee  Anti-Embolism Device Status: On  Anti-Embolism Device Location: BLE  Taken 06/25/2024 0014 by Orlando Wes CROME, RN  Anti-Embolism Device Type: SCD, Knee  Anti-Embolism Device Status: On  Anti-Embolism Device Location: BLE  Taken 06/24/2024 2200 by Orlando Wes CROME, RN  Anti-Embolism Device Type: SCD, Knee  Anti-Embolism Device Status: On  Anti-Embolism Device Location: BLE  Taken 06/24/2024 2047 by Orlando Wes CROME, RN  Anti-Embolism Device Type: SCD, Knee  Anti-Embolism Device Status: On  Anti-Embolism Device Location: BLE  Taken 06/24/2024 2000 by Orlando Wes CROME, RN  Anti-Embolism Device Type: SCD, Knee  Anti-Embolism Device Status: On  Anti-Embolism Device Location: BLE  Goal: Optimal Comfort and Wellbeing  Outcome: Shift Focus     Problem: Fall Injury Risk  Goal: Absence of Fall and Fall-Related Injury  Outcome: Shift Focus  Intervention: Promote Injury-Free Environment  Recent Flowsheet Documentation  Taken 06/25/2024 0209 by Orlando Wes CROME, RN  Safety Interventions:  fall reduction program maintained   low bed  Taken 06/25/2024 0014 by Orlando Wes CROME, RN  Safety Interventions:   fall reduction program maintained   low bed  Taken 06/24/2024 2200 by Orlando Wes CROME, RN  Safety Interventions:   fall reduction program maintained   low bed  Taken 06/24/2024 2000 by Orlando Wes CROME, RN  Safety Interventions:   fall reduction program maintained   low bed     Problem: Self-Care Deficit  Goal: Improved Ability to Complete Activities of Daily Living  Outcome: Shift Focus     Problem: Anemia  Goal: Anemia Symptom Improvement  Outcome: Shift Focus  Intervention: Monitor and Manage Anemia  Recent Flowsheet Documentation  Taken 06/25/2024 0209 by Orlando Wes CROME, RN  Safety Interventions:   fall reduction program maintained   low bed  Taken 06/25/2024 0014 by Orlando Wes CROME, RN  Safety Interventions:   fall reduction program maintained   low bed  Taken 06/24/2024 2200 by Orlando Wes CROME, RN  Safety Interventions:   fall reduction program maintained   low bed  Taken 06/24/2024 2000 by Orlando Wes CROME, RN  Safety Interventions:   fall reduction program maintained   low bed

## 2024-06-25 NOTE — Telephone Encounter (Unsigned)
 Copied from CRM [id number]. Topic: Scheduling - New Appointment  >> Jun 25, 2024 11:33 AM Justice S wrote:  Caller asked to schedule a new appointment      Additional Requests/Needs: Yes Appointment Related:   [place] Admit 06/23/2024 - Discharge 06/25/2024  No available appts in time frame needed. Please call the patients sister, Marval Amend at [phone]

## 2024-06-25 NOTE — Plan of Care (Signed)
 Problem: Adult Inpatient Plan of Care  Goal: Absence of Hospital-Acquired Illness or Injury  Outcome: Discharged to Home  Intervention: Identify and Manage Fall Risk  Recent Flowsheet Documentation  Taken 06/25/2024 1235 by Perley Adriana ORN, RN  Safety Interventions:   fall reduction program maintained   low bed  Taken 06/25/2024 1000 by Perley Adriana ORN, RN  Safety Interventions:   fall reduction program maintained   low bed  Taken 06/25/2024 0805 by Perley Adriana ORN, RN  Safety Interventions:   fall reduction program maintained   low bed  Intervention: Prevent Skin Injury  Recent Flowsheet Documentation  Taken 06/25/2024 1235 by Perley Adriana ORN, RN  Positioning for Skin: Left  Taken 06/25/2024 1000 by Perley Adriana ORN, RN  Positioning for Skin: Supine/Back  Taken 06/25/2024 0935 by Perley Adriana ORN, RN  Positioning for Skin: Supine/Back  Taken 06/25/2024 0805 by Perley Adriana ORN, RN  Positioning for Skin: Supine/Back  Skin Protection: incontinence pads utilized  Intervention: Prevent and Manage VTE (Venous Thromboembolism) Risk  Recent Flowsheet Documentation  Taken 06/25/2024 1235 by Perley Adriana ORN, RN  Anti-Embolism Device Status: Refused  Taken 06/25/2024 1000 by Perley Adriana ORN, RN  Anti-Embolism Device Status: Refused  Taken 06/25/2024 0935 by Perley Adriana ORN, RN  Anti-Embolism Device Type: SCD, Knee  Anti-Embolism Device Status: On  Anti-Embolism Device Location: BLE  Taken 06/25/2024 0805 by Perley Adriana ORN, RN  Anti-Embolism Device Type: SCD, Knee  Anti-Embolism Device Status: On  Anti-Embolism Device Location: BLE  Goal: Optimal Comfort and Wellbeing  Outcome: Discharged to Home  Goal: Readiness for Transition of Care  Outcome: Discharged to Home  Goal: Rounds/Family Conference  Outcome: Discharged to Home       Discharge orders present and patient verbalizes readiness for discharge.  AVS reviewed and patient verbalized understanding with no additional questions.  Discharge medications received from [facility]. Pt escorted out of hospital via wheelchair.

## 2024-06-25 NOTE — Telephone Encounter (Signed)
 Called and left voice message to schedule hospital follow up. Please transfer to Ext [phone]

## 2024-06-25 NOTE — Discharge Summary (Signed)
 Physician Discharge Summary [facility]  [facility] [facility]  [address]  [place] [address]  Dept: [phone]  Loc: [phone]     Identifying Information:   ERNA BROSSARD  04-05-1966  [id number]    Primary Care Physician: Waylan Leita Linsey, MD   Code Status: Full Code    Admit Date: 06/23/2024    Discharge Date: 06/25/2024     Discharge To: Home    Discharge Service: [facility] - [facility]     Discharge Attending Physician: Norleen Ozell Bible, MD    Discharge Diagnoses:  Principal Problem:    Anemia  Active Problems:    Insomnia    Itching    Type 2 diabetes mellitus without complications (CMS-HCC)    Decompensated cirrhosis    (CMS-HCC)    Portal hypertension    (CMS-HCC)    Nausea and vomiting    Recurrent falls    Hypokalemia      Outpatient Provider Follow Up Issues:   [ ]  repeat CBC, ferritin  [ ]  help patient get GI follow-up  [ ]  follow-up reticulocyte count  [ ]  recommend DEXA scan    Hospital Course:   BRIELLAH BAIK is a 58 y.o. female whose presentation is complicated by decompensated cirrhosis, hyperlipidemia, portal hypertension, pruritus, type 2 diabetes, and depression presenting to [facility] Emergency Department with recurrent falls and anemia.     Patient was found to be anemic at 5.8. She required 2u pRBC.  Her hemoglobin rose and stabilized and was 8.3 at discharge. There was no concern for active GI bleed. Additional lab work was drawn to rule out hemolysis which was reassuring against this. Suspected iron  deficiency given low iron  and low MCV. She was discharged on oral iron . We do recommend GI follow-up outpatient given known history of multiple AVMs.    # Recurrent Falls - Weakness   Likely due to chronic anemia. During admission, she had a nutrition consult and PT/OT evaluated her and she was set up with home health.      # T-Spine fracture  Patient has acute compression fracture of T12 with less than 25% of height loss, likely secondary to recent falls. Neurosurgery consulted, no need for surgical intervention. Pain was controlled with oral and topical medications. We recommend DEXA Scan outpatient.     # Thrombocytopenia  Remained within patient's baseline of 30-50.    # Cirrhosis - Ascites  Bumex  and spironolactone  was held on admission due to softer BP but restarted at discharge. Her lactulose  was continued.    Her other home medications were continued.    Touchbase with Outpatient Provider:  Warm Handoff: Completed on 06/25/24 by Darice DELENA Croon, MD  (Resident) via [facility]    Procedures:  No admission procedures for hospital encounter.  ______________________________________________________________________  Discharge Medications:     Your Medication List        START taking these medications      ferrous sulfate  325 (65 FE) MG tablet  Take 1 tablet (325 mg total) by mouth every other day.            CONTINUE taking these medications      AEROCHAMBER MV inhaler  Generic drug: inhalational spacing device  Use as directed with inhalers     atorvastatin  20 MG tablet  Commonly known as: LIPITOR   TAKE ONE TABLET BY MOUTH ONCE DAILY     blood-glucose meter kit  Use to test blood sugar DAILY  bumetanide  1 MG tablet  Commonly known as: BUMEX   TAKE 2 TABLETS BY MOUTH ONCE DAILY     cholecalciferol (vitamin D3 25 mcg (1,000 units)) 1,000 unit (25 mcg) tablet  Take 2 tablets (50 mcg total) by mouth daily.     DAILY-VITE (WITH FOLIC ACID) 400 mcg Tab tablet  Generic drug: multivitamin with folic acid  Take 1 tablet by mouth daily.     glucose blood test strip  Generic drug: blood sugar diagnostic  Use to test blood sugar daily     lactulose  20 gram packet  Commonly known as: CEPHULAC   Mix and take 2 packets (40 g total) by mouth Three (3) times a day.     lancets Misc  Use to test blood sugar daily     loratadine  10 mg tablet  Commonly known as: CLARITIN   TAKE ONE TABLET BY MOUTH ONCE DAILY     metFORMIN  500 MG 24 hr tablet  Commonly known as: GLUCOPHAGE -XR  TAKE 1 TABLET BY MOUTH EVERY EVENING with dinner     multivitamin per tablet  Commonly known as: TAB-A-VITE/THERAGRAN  Take 1 tablet by mouth daily.     naltrexone  50 mg tablet  Commonly known as: DEPADE  TAKE ONE TABLET BY MOUTH ONCE DAILY     omeprazole  20 MG capsule  Commonly known as: PriLOSEC  TAKE 1 CAPSULE BY MOUTH TWICE DAILY 30MINS BEFORE MEALS     OZEMPIC  2 mg/dose (8 mg/3 mL) Pnij  Generic drug: semaglutide   INJECT 2MG  under the skin once weekly (every 7 days)     sertraline  100 MG tablet  Commonly known as: ZOLOFT   Take 1 tablet (100 mg total) by mouth daily.     spironolactone  100 MG tablet  Commonly known as: ALDACTONE   TAKE 2 TABLETS BY MOUTH ONCE DAILY     XIFAXAN  550 mg Tab  Generic drug: rifAXIMin   Take 1 tablet (550 mg total) by mouth two (2) times a day.              Allergies:  Shellfish containing products, Glipizide , and Lisinopril   ______________________________________________________________________  Pending Test Results (if blank, then none):  Pending Labs       Order Current Status    Reticulocytes In process            Most Recent Labs:  All lab results last 24 hours -   Recent Results (from the past 24 hours)   Basic Metabolic Panel    Collection Time: 06/25/24  6:31 AM   Result Value Ref Range    Sodium 146 (H) 135 - 145 mmol/L    Potassium 3.5 3.4 - 4.8 mmol/L    Chloride 104 98 - 107 mmol/L    CO2 25.0 20.0 - 31.0 mmol/L    Anion Gap 17 (H) 5 - 14 mmol/L    BUN 14 9 - 23 mg/dL    Creatinine 8.86 (H) 0.55 - 1.02 mg/dL    BUN/Creatinine Ratio 12     eGFR CKD-EPI (2021) Female 57 (L) >=60 mL/min/1.53m2    Glucose 135 70 - 179 mg/dL    Calcium 9.2 8.7 - 89.5 mg/dL   CBC    Collection Time: 06/25/24  6:32 AM   Result Value Ref Range    WBC 5.8 3.6 - 11.2 10*9/L    RBC 3.35 (L) 3.95 - 5.13 10*12/L    HGB 8.3 (L) 11.3 - 14.9 g/dL    HCT 74.5 (L) 65.9 - 44.0 %  MCV 75.8 (L) 77.6 - 95.7 fL    MCH 24.7 (L) 25.9 - 32.4 pg    MCHC 32.5 32.0 - 36.0 g/dL    RDW 79.0 (H) 87.7 - 15.2 %    MPV 11.0 (H) 6.8 - 10.7 fL    Platelet 52 (L) 150 - 450 10*9/L       Relevant Studies/Radiology (if blank, then none):  ECG 12 Lead  Result Date: 06/24/2024  NORMAL SINUS RHYTHM NORMAL ECG WHEN COMPARED WITH ECG OF 04-Sep-2023 13:22, NONSPECIFIC T WAVE ABNORMALITY NOW EVIDENT IN LATERAL LEADS Confirmed by Antonetta Gull ([id number]) on 06/24/2024 12:56:39 PM    XR Thoracic Spine 2 Views  Result Date: 06/23/2024  EXAM: XR THORACIC SPINE 2 VIEWS DATE: 06/23/2024 8:47 PM ACCESSION: [id number] UN DICTATED: 06/23/2024 9:29 PM INTERPRETATION LOCATION: [facility] CLINICAL INDICATION: 58 years old Female with please shoot upright to see if any change in alignment in t12 as per neurosurgery  COMPARISON: CT LUMBAR SPINE WO CONTRAST 06/23/2024 TECHNIQUE: AP and Lateral views of the thoracic spine. FINDINGS: Unchanged appearance of T12 compression fracture with less than 25% vertebral body height loss. No spondylolisthesis. Multilevel degenerative changes noted throughout the thoracic spine. Amplatz ASD closure device projects over the expected location of the interatrial septum. Embolization coils and surgical clips are noted in the right upper quadrant. No paravertebral soft tissue abnormality.     T12 compression fracture with less than 25% vertebral body height loss. No spondylolisthesis on upright imaging.    XR Trauma Hip Left  Result Date: 06/23/2024  EXAM: XR TRAUMA HIP LEFT DATE: 06/23/2024 6:33 PM ACCESSION: [id number] UN DICTATED: 06/23/2024 7:24 PM INTERPRETATION LOCATION: [facility] CLINICAL INDICATION: 58 years old Female with fall    COMPARISON: None. TECHNIQUE: AP view of the pelvis. AP and cross table lateral views of the left femur. FINDINGS: No acute displaced pelvic fracture identified. Mild left hip joint space narrowing with marginal osteophyte formation. The right hip appears normally aligned on limited views with moderate osteoarthrosis. The left knee appears normally aligned with severe tricompartmental osteoarthrosis on limited views. No pubic symphysis diastasis. The sacroiliac joints are symmetric. The visualized lumbar spine is unremarkable. No focal soft tissue abnormality. Hernia mesh overlies the midline pelvis.     No acute fracture or dislocation.    CT lumbar spine WO contrast  Result Date: 06/23/2024  EXAM: Computed tomography, lumbar spine without contrast material. DATE: 06/23/2024 5:51 PM ACCESSION: [id number] UN DICTATED: 06/23/2024 6:07 PM INTERPRETATION LOCATION: [facility] CLINICAL INDICATION: 58 years old Female with fall, lumbar pain ; concern for fx  COMPARISON: CT abdomen pelvis GI bleed 08/13/2023 TECHNIQUE: Axial CT images through the lumbar spine without contrast. Coronal and sagittal reformatted images, bone and soft tissue algorithm are provided. FINDINGS:  New compression fracture of the T12 vertebral body with less than 25% height loss and no retropulsion. The vertebrae are normally aligned. Mild multilevel degenerative changes. No paravertebral soft tissue abnormality. Small volume ascites.     New likely acute compression fracture of the T12 vertebral body with less than 25% height loss and no retropulsion.    CT Head Wo Contrast  Result Date: 06/23/2024  EXAM: CT HEAD WO CONTRAST, CT CERVICAL SPINE WO CONTRAST DATE: 06/23/2024 5:51 PM ACCESSION: [id number] ROLANDA [id number] UN DICTATED: 06/23/2024 5:56 PM INTERPRETATION LOCATION: MAIN [facility]AMP[facility] CLINICAL INDICATION: 58 years old Female with fall, head trauma ; concern for ich  COMPARISON: CT head 07/12/2023 TECHNIQUE: Volumetric CT acquisition was performed through the head and cervical spine without the  use of intravenous contrast. Post processed coronal and sagittal reformatted images were obtained. FINDINGS: CT HEAD: Global cerebral atrophy with ex vacuo dilitation of the ventricles and CSF containing spaces.  The gray-white matter differentiation is intact. There is no evidence of acute infarct, hemorrhage, mass or mass effect. Mild bilateral ethmoid sinus mucosal thickening. Near complete opacification of the left sphenoid sinus, similar to prior. The mastoid air cells are aerated. No acute calvarial fracture is identified. CT CERVICAL SPINE: There is normal mineralization and alignment. No acute fracture is identified. Mild to moderate multilevel degenerative disc disease and facet joint arthropathy. Mild to moderate degenerative changes at the atlantodental interval. The facet joints appear intact.The prevertebral soft tissues appear unremarkable.     No evidence of acute intracranial pathology. No evidence of acute fracture or traumatic listhesis of the cervical spine.     CT Cervical Spine Wo Contrast  Result Date: 06/23/2024  EXAM: CT HEAD WO CONTRAST, CT CERVICAL SPINE WO CONTRAST DATE: 06/23/2024 5:51 PM ACCESSION: [id number] ROLANDA [id number] UN DICTATED: 06/23/2024 5:56 PM INTERPRETATION LOCATION: MA[facility]AMP[facility] CLINICAL INDICATION: 58 years old Female with fall, head trauma ; concern for ich  COMPARISON: CT head 07/12/2023 TECHNIQUE: Volumetric CT acquisition was performed through the head and cervical spine without the use of intravenous contrast. Post processed coronal and sagittal reformatted images were obtained. FINDINGS: CT HEAD: Global cerebral atrophy with ex vacuo dilitation of the ventricles and CSF containing spaces.  The gray-white matter differentiation is intact. There is no evidence of acute infarct, hemorrhage, mass or mass effect. Mild bilateral ethmoid sinus mucosal thickening. Near complete opacification of the left sphenoid sinus, similar to prior. The mastoid air cells are aerated. No acute calvarial fracture is identified. CT CERVICAL SPINE: There is normal mineralization and alignment. No acute fracture is identified. Mild to moderate multilevel degenerative disc disease and facet joint arthropathy. Mild to moderate degenerative changes at the atlantodental interval. The facet joints appear intact.The prevertebral soft tissues appear unremarkable.     No evidence of acute intracranial pathology. No evidence of acute fracture or traumatic listhesis of the cervical spine.     ______________________________________________________________________  Discharge Instructions:           Other Instructions       Discharge instructions      You were admitted to [facility] for anemia (low blood counts). We monitored your hemoglobin and checked additional labs. You are not having an active GI bleed and we think that your anemia is due to iron  deficiency. We are starting you on oral iron  supplements to help these levels. The iron  supplements should be taken every other day, best absorbed on an empty stomach or with a small amount of vitamin C (like citrus).     Please carefully read and follow these instructions below upon your discharge:    1) Please take your medications as prescribed and note the changes listed on your discharge. At future follow-up appointments, please be sure to take all of your medications with you so your provider can better guide your care.     2) Seek medical care with your primary care doctor or local Emergency Room or Urgent Care if you develop any changes in your mental status, worsening abdominal pain, fevers greater than 101.5, any unexplained/unrelieved shortness of breath, uncontrolled nausea and vomiting that keeps you from remaining hydrated or taking your medication, or any other concerning symptoms.     3) Please go to your follow-up appointments. Some  of your follow-up appointments have been listed below. If you do not see an appointment listed below with your primary care doctor, please call your doctor's office as soon as possible to schedule an appointment to be seen within 7-10 days of discharge.     4) If you have any concerns before you are able to follow-up with your primary care doctor, you can reach us  by calling [phone] and asking to page the Fam [facility] resident on call.            Resources and Referrals       Commode      Length of Need: 99    Type of commode: Chair Comment - 3 N  1    Height: 165.1 cm (5' 5)     Weight: (P) 90.8 kg (200 lb 1.6 oz)        Height: 165.1 cm (5' 5)     Weight: (P) 90.8 kg (200 lb 1.6 oz)             Follow Up instructions and Outpatient Referrals     Discharge instructions          Appointments which have been scheduled for you      Jul 09, 2024 4:00 PM  (Arrive by 3:45 PM)  MRI Abdomen With and Without Contrast with [facility] MRI MBL  [facility]) [address]  [place] [address]  [phone]   On appointment date:  Bring recent lab work  Bring documentation of any metal object implants  Take meds as usual  Check with physician if diabetic  You will be asked to change into a gown for your safety    On appointment date do not:  Consume anything 4 hrs prior to procedure  Wear metallic items including jewelry (we are not responsible for lost items)    Let us  know if patient:  Claustrophobic  Metal object implant  Pregnant  Prescribed a sedative  On dialysis  Allergic to MRI dye/contrast  Kidney Failure       Dec 17, 2024 12:20 PM  RETURN CARDIOLOGY with Margery Ruth, MD  [facility] CARDIOLOGY AT [facility] REGION) [address] [place] [address]  [phone]             ______________________________________________________________________  Discharge Day Services:  BP 101/75  - Pulse 87  - Temp 36.3 ??C (97.4 ??F) (Temporal)  - Resp 18  - Ht 165.1 cm (5' 5)  - Wt (!) 135.2 kg (298 lb)  - SpO2 97%  - BMI 49.59 kg/m??   Pt seen on the day of discharge and determined appropriate for discharge.    GEN: well appearing, lying in bed, NAD  HEENT: NCAT, MMM. EOMI.  Neck: Supple.  CV: Regular rate and rhythm. II/VI systolic murmur present.  Pulm: CTAB. No wheezing, crackles, or rhonchi.  Abd: mildly distended, mildly TTP. Normoactive bowel sounds.    Neuro: A&O x 3. No focal deficits.   Ext: No peripheral edema.  Palpable distal pulses.    Condition at Discharge: good    Length of Discharge: I spent greater than 30 mins in the discharge of this patient.    Darice Croon, MD  [facility], PGY 3  06/25/24 12:44 PM

## 2024-06-25 NOTE — Treatment Plan (Signed)
 The patient requires a bedside commode. The patient is confined to one room.    Darice Croon, MD  [facility], PGY 3  06/25/24 11:57 AM

## 2024-06-25 NOTE — Discharge Instr - Other Info (Signed)
 Home health has been set up for through the agency listed below. The Home health agency will be contacting you to set up a time for them to come see you in your home within 2 days of your discharge.  If you have not heard from them prior to 06/27/24 or you have any questions about home health, please contact them at the phone number listed below.    Durable Medical Equipment - Admitted Since 06/23/2024       Service Provider Services Address Referrals Phone Referrals Fax Patient Preferred    [facility] Medical Equipment Primary: Durable Medical Equipment [address] [place], [place] [address] [phone] --          [facility]  - Admitted Since 06/23/2024       Service Provider Services Address Referrals Phone Referrals Fax Patient Preferred    [facility] Primary: Home Health Care  Secondary: Home Occupational Therapy, Home Physical Therapy [address], [place] [address] [phone] --

## 2024-06-26 LAB — PHOSPHATIDYLETHANOL (PETH)
PETH 16:0/18:1 (POPETH) BY LC-MS/MS: <10 ng/mL
PETH 16:0/18:2 (PLPETH) BY LC-MS/MS: <10 ng/mL
PETH INTERPRETATION: NEGATIVE

## 2024-06-28 DIAGNOSIS — K7469 Other cirrhosis of liver: Principal | ICD-10-CM

## 2024-06-28 DIAGNOSIS — R296 Repeated falls: Principal | ICD-10-CM

## 2024-06-28 DIAGNOSIS — D696 Thrombocytopenia, unspecified: Principal | ICD-10-CM

## 2024-06-28 DIAGNOSIS — K7581 Nonalcoholic steatohepatitis (NASH): Principal | ICD-10-CM

## 2024-06-28 DIAGNOSIS — S22080A Wedge compression fracture of T11-T12 vertebra, initial encounter for closed fracture: Principal | ICD-10-CM

## 2024-06-28 DIAGNOSIS — D62 Acute posthemorrhagic anemia: Principal | ICD-10-CM

## 2024-06-28 DIAGNOSIS — R110 Nausea: Principal | ICD-10-CM

## 2024-06-28 MED ORDER — ONDANSETRON HCL 8 MG TABLET
ORAL_TABLET | Freq: Three times a day (TID) | ORAL | 2 refills | 10.00000 days | Status: CP | PRN
Start: 2024-06-28 — End: 2024-07-28

## 2024-06-28 NOTE — Progress Notes (Signed)
 [facility] Telemedicine Transitions of Care Visit Note    Subjective     Admission Date: 06/23/24  Discharge Date: 06/25/24  Discharge Hospital/Unit: 3 BT1 [facility]  The patient was [facility] and sent to her home.    Post discharge interactive communication via telephone was made with patient on 06/25/2024 and I have reviewed the information from that communication.    Today's (06/28/2024) interactive visit is within 7 days days of discharge.    Chief Complaint: Patient presents in follow-up to their recent hospitalization and to discuss the following medical problems: anemia. Patient was found to be anemic at 5.8. She required 2u pRBC. Her hemoglobin rose and stabilized and was 8.3 at discharge. There was no concern for active GI bleed. Additional lab work was drawn to rule out hemolysis which was reassuring against this. Suspected iron  deficiency given low iron  and low MCV. She was discharged on oral iron . We do recommend GI follow-up outpatient given known history of multiple AVMs.     Present on Phone Call: Is there someone else in the room? No..    HISTORY OF PRESENT ILLNESS:    Denise York is a 58 y.o. female who presents for transitions hospital follow up.    Hospitalized for: acute anemia secondary to bleeding AVMs    Interval update: She says that she feels better energy wise, but she has a compressions fracture in her thoracic spine and is having pain in her back. She says that she has pain when she sits.     A compression fracture of T12 with less than 25% of height loss, likely secondary to recent falls. Neurosurgery consulted, no need for surgical intervention. Pain was controlled with oral and topical medications. We recommend DEXA Scan outpatient.     Pt reported factors contributing to admission or ED visit:  anemia, fatigue and frequent falls.     Patient uses pill box yes    PHQ-9 PHQ-9 Total Score   11/27/2023  11:15 AM 9   06/27/2023  10:00 AM 15 12/19/2022   2:45 PM 11    11/21/2022   2:00 PM 16    08/14/2022   3:00 PM 17    07/09/2022   4:00 PM 13    05/08/2022   2:00 PM 20        Data saved with a previous flowsheet row definition       The remaining 10 systems reviewed were negative.    I have reviewed the patients discharge summary for this hospitalization.  I have also reviewed the problem list, allergies, family and social history and updated them as needed.         Objective     General: Well appearing , in no acute distress  Skin: Color is normal. No rashes.  Psych: Appropriate affect, normal mood  Resp: Normal work of breathing, no retractions    Labs:  I have reviewed the labs from this hospitalization and the ones on the day of discharge and have followed up on any pending labs at the time of discharge.  See Epic Labs section for details.         Assessment/Plan:          Problem List Items Addressed This Visit    None       Medications prescribed or ordered upon discharge were reviewed today and reconciled with the most recent outpatient medication list.  Medication reconciliation was conducted by a  prescribing practitioner, or clinical pharmacist.      The following medications changes were made: Patient was given a prescription for Zofran  for nausea    Biggest Risk for Readmission: pain or recurrence of anemia  S-Does the patient have any ongoing or worsening symptoms related to their condition? Unknown for anemia as she has not had labs, but has PCP appointment on Monday. Back pain is continued.   S-Does the patient need skilled care (PT/OT/Wound care) that was not arranged at discharged or was arranged but not happening as intended? She has PT coming to her home on Friday. She doesn't remember the name but it is a company from [employer].   M-Is the patient/caregiver able to describe the current medication management strategy? yes  I-Is the patient/caregiver able to describe their condition, identify red flag symptoms, and understand discharge instructions? yes  L- Does the patient have limited or no access to any of the following?  Healthcare follow up/support network/nutrition to promote wellness and healing. none  E-Does the patient/caregiver actively engage around their care plan? yes    Items to follow-up on next visit: Continued back pain from thoracic spine compression fracture. Recommending referral to Ortho/ Spine at [facility] for further evaluation and pain management. Referral placed and will communicate with patient's PCP as well.     Patient also needs follow up appointment with hepatology for further evaluation of bleeding varices. Will communicate with her hepatologist and PCP regarding this.     Medical decision making was of moderate [phone]- must be seen within 14 days) complexity.    Necessary referral have been made.  See Visit Summary for details of referrals.    I will forward my plan and recommendations to patients PCP, Bowen, Leita Linsey, MD    Follow-up with PCP    Follow-up with PCP or another provider has been scheduled:   Future Appointments   Date Time Provider Department Center   07/05/2024 11:20 AM Bowen, Leita Linsey, MD [facility]   07/09/2024  4:00 PM [facility]   12/17/2024 12:20 PM Jama Old, MD [facility]          The patient reports they are physically located in [place]  and is currently: at home. I conducted a audio/video visit. I spent  78m 09s on the video call with the patient. I spent an additional 40 minutes on pre- and post-visit activities on the date of service .

## 2024-06-28 NOTE — Telephone Encounter (Signed)
 Scheduled with [facility] for 12/22 5:40 and also scheduled a follow up with Dr. Waylan on 12/29

## 2024-06-28 NOTE — Telephone Encounter (Signed)
 Called and left another voice message. Transfer to Ext [phone]

## 2024-06-29 NOTE — Patient Instructions (Signed)
 SPECIFIC INSTRUCTIONS WE DISCUSSED TODAY:  VISIT SUMMARY:    During your visit, we discussed your severe diarrhea, fluid retention, anemia, and diabetes management. We also administered your flu shot as requested.    YOUR PLAN:    -CHRONIC DIARRHEA: Chronic diarrhea is frequent, watery bowel movements that can lead to dehydration and other issues. We suspect a possible C. difficile infection and have ordered a stool sample for testing. Please use the provided stool kit for C. difficile and blood testing.    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 Continue taking Bumex  and spironolactone  as prescribed to manage fluid retention. Monitor your blood pressure and fluid status regularly, and use compression hose if available.    -ANEMIA: Anemia is a condition where you don't have enough healthy red blood cells to carry adequate oxygen to your body's tissues. We have ordered a blood count to assess your anemia status. If your anemia worsens, please visit the ER for a potential transfusion.    -TYPE 2 DIABETES MELLITUS: Type 2 diabetes is a condition that affects the way your body processes blood sugar. It is important to monitor your blood sugar levels regularly. Please resume checking your blood sugar at home. STOP METFORMIN  IF YOU HAVE NOT ALREADY DONE SO.     -GENERAL HEALTH MAINTENANCE: We administered your flu shot as requested to help protect you from the flu this season.    INSTRUCTIONS:    Please follow up with the stool sample and blood tests as ordered. Continue monitoring your blood pressure and blood sugar levels at home. If your anemia worsens, visit the ER for a potential transfusion.  Thank you for choosing [facility] for your care!    If you have a question about a recent visit or a plan that was discussed with your provider in the past: Please go to [email or web address] (or your phone app) and sign in to your [facility] Chart to send this information. My Chart messages go to my staff first. I do my best to respond to messages within 3 business days. For more urgent questions, CALLING THE OFFICE is always the best option.    If you are experiencing any new or worsening symptoms or you would like to talk about changing a medication or medical plan; it is best to schedule an appointment: Please go onto My [facility] Chart call our clinic at [phone] to schedule an appointment with your provider. If you cannot decide if your issue warrants a visit, you can always ask to discuss this with one of our clinical care team.    If you have urgent healthcare needs after normal business hours, on weekends, or during holidays:  DUE TO CIRCUMSTANCES Beyond our direct control, We NO LONGER have an Acute Care Clinic. I am not happy with this decision but cannot change it.   You can always reach the [facility] 24/7 Nursing Line after hours to get nurse advice or get directed to appropriate care outside of regular business hours. The nurse can consult with the doctor on call if indicated. Just call our main office number and follow the prompts - [phone]     You may receive a patient satisfaction survey by mail, text or email regarding your visit today. Your opinion is important to me. Your response benefits us  all :-)    If you had or will  have testing done, test results will be posted on [facility] or sent via letter.  A member of our Care Team will also contact you by message or phone if your results require follow-up.Please note that specialty test results take longer. Lab results frequently post before I have the chance to review them. We will follow up as needed when I review them.    Leita HERO. Jakiah Bienaime, MD -   [facility]  [address], [place] [address]  Phone: [phone] Fax: [phone]  [email or web address]

## 2024-07-05 ENCOUNTER — Encounter: Admit: 2024-07-05 | Discharge: 2024-07-06 | Payer: PRIVATE HEALTH INSURANCE

## 2024-07-05 ENCOUNTER — Ambulatory Visit: Admit: 2024-07-05 | Discharge: 2024-07-06 | Payer: PRIVATE HEALTH INSURANCE

## 2024-07-05 DIAGNOSIS — K7682 Hepatic encephalopathy    (CMS-HCC): Principal | ICD-10-CM

## 2024-07-05 DIAGNOSIS — D696 Thrombocytopenia, unspecified: Principal | ICD-10-CM

## 2024-07-05 DIAGNOSIS — K7581 Nonalcoholic steatohepatitis (NASH): Principal | ICD-10-CM

## 2024-07-05 DIAGNOSIS — R197 Diarrhea, unspecified: Principal | ICD-10-CM

## 2024-07-05 DIAGNOSIS — D62 Acute posthemorrhagic anemia: Principal | ICD-10-CM

## 2024-07-05 DIAGNOSIS — E119 Type 2 diabetes mellitus without complications: Principal | ICD-10-CM

## 2024-07-05 DIAGNOSIS — Z79899 Other long term (current) drug therapy: Principal | ICD-10-CM

## 2024-07-05 DIAGNOSIS — K7469 Other cirrhosis of liver: Principal | ICD-10-CM

## 2024-07-05 LAB — CBC W/ AUTO DIFF
BASOPHILS ABSOLUTE COUNT: 0.1 10*9/L (ref 0.0–0.1)
BASOPHILS RELATIVE PERCENT: 0.9 %
EOSINOPHILS ABSOLUTE COUNT: 0.1 10*9/L (ref 0.0–0.5)
EOSINOPHILS RELATIVE PERCENT: 1.3 %
HEMATOCRIT: 26.2 % — ABNORMAL LOW (ref 34.0–44.0)
HEMOGLOBIN: 8.4 g/dL — ABNORMAL LOW (ref 11.3–14.9)
LYMPHOCYTES ABSOLUTE COUNT: 0.8 10*9/L — ABNORMAL LOW (ref 1.1–3.6)
LYMPHOCYTES RELATIVE PERCENT: 13.5 %
MEAN CORPUSCULAR HEMOGLOBIN CONC: 32 g/dL (ref 32.0–36.0)
MEAN CORPUSCULAR HEMOGLOBIN: 24.6 pg — ABNORMAL LOW (ref 25.9–32.4)
MEAN CORPUSCULAR VOLUME: 76.8 fL — ABNORMAL LOW (ref 77.6–95.7)
MEAN PLATELET VOLUME: 10.2 fL (ref 6.8–10.7)
MONOCYTES ABSOLUTE COUNT: 0.4 10*9/L (ref 0.3–0.8)
MONOCYTES RELATIVE PERCENT: 6.4 %
NEUTROPHILS ABSOLUTE COUNT: 4.6 10*9/L (ref 1.8–7.8)
NEUTROPHILS RELATIVE PERCENT: 77.9 %
PLATELET COUNT: 59 10*9/L — ABNORMAL LOW (ref 150–450)
RED BLOOD CELL COUNT: 3.41 10*12/L — ABNORMAL LOW (ref 3.95–5.13)
RED CELL DISTRIBUTION WIDTH: 23.6 % — ABNORMAL HIGH (ref 12.2–15.2)
WBC ADJUSTED: 5.8 10*9/L (ref 3.6–11.2)

## 2024-07-05 LAB — COMPREHENSIVE METABOLIC PANEL
ALBUMIN: 2.6 g/dL — ABNORMAL LOW (ref 3.4–5.0)
ALKALINE PHOSPHATASE: 116 U/L (ref 46–116)
ALT (SGPT): 49 U/L (ref 10–49)
ANION GAP: 13 mmol/L (ref 5–14)
AST (SGOT): 76 U/L — ABNORMAL HIGH (ref <=34)
BILIRUBIN TOTAL: 1.7 mg/dL — ABNORMAL HIGH (ref 0.3–1.2)
BLOOD UREA NITROGEN: 15 mg/dL (ref 9–23)
BUN / CREAT RATIO: 13
CALCIUM: 8.7 mg/dL (ref 8.7–10.4)
CHLORIDE: 101 mmol/L (ref 98–107)
CO2: 27 mmol/L (ref 20.0–31.0)
CREATININE: 1.13 mg/dL — ABNORMAL HIGH (ref 0.55–1.02)
EGFR CKD-EPI (2021) FEMALE: 57 mL/min/1.73m2 — ABNORMAL LOW (ref >=60)
GLUCOSE RANDOM: 148 mg/dL (ref 70–179)
POTASSIUM: 3.8 mmol/L (ref 3.4–4.8)
PROTEIN TOTAL: 6.1 g/dL (ref 5.7–8.2)
SODIUM: 141 mmol/L (ref 135–145)

## 2024-07-05 LAB — RETICULOCYTES
RETICULOCYTE ABSOLUTE COUNT: 85.2 10*9/L (ref 23.0–100.0)
RETICULOCYTE COUNT PCT: 2.5 % — ABNORMAL HIGH (ref 0.50–2.17)

## 2024-07-05 LAB — SLIDE REVIEW

## 2024-07-05 LAB — FERRITIN: FERRITIN: 12.3 ng/mL — ABNORMAL LOW (ref 30.0–270.7)

## 2024-07-05 NOTE — Progress Notes (Signed)
 Chief Complaint:  Chief Complaint   Patient presents with    Follow-up     Has a A1 fracture and her vagina is raw from wiping a lot, per pt  Also her feet and ankles are very swollen, per pt      Edema    Diarrhea    Anemia     Requiiring transfusing of blood in the rescent hospitalization.        This patients last WCC/CPE date: : 06/27/2023  This patient's last AWV date: : Not Found    History of Present Illness: (pt made aware of use of [employer] program during this visit and consented to use)  History of Present Illness  Denise York is a 58 year old female with anemia and fluid retention who presents with severe diarrhea and swelling.    Diarrhea  - Severe watery diarrhea since being in the hospital  - patient personally attributes this to taking iron   - Frequency of four to eight watery stools per day  - Diarrhea began after hospital discharge and has slightly decreased over time  - No vomiting  - Unable to keep food down; food 'goes in and immediately comes out'  - Diarrhea has a foul odor  - No pills observed in stool  - Has not taken lactulose  since diarrhea onset  - Patient with cirrhosis, chronic PPI, and chronic rifaximin  AND was just in the hospital which increases her risk for C. Diff.     Peripheral edema and fluid retention  - Swelling in feet and hands persistent since hospital discharge  - Currently taking Bumex  and spironolactone  for fluid retention  - Weight increased from 198 pounds (pre-hospitalization) to 206 pounds  - Hospital weight was incorrectly recorded as 298 pounds (patient states it was 198 in hospital)    Anemia and fatigue  - History of anemia with recent transfusions during hospitalization  - Fatigue present  - Low blood pressure at home and during recent hospital stay    Diabetes management  - Has not checked blood sugar since returning home from hospital  - Currently taking Ozempic   - Last HgbA1c 5.3 06/23/24      Patient Care Team:  Waylan Leita Linsey, MD as PCP - General (Family Medicine)  Waylan, Leita Linsey, MD as PCP - Deberah Minnie Donald KATHEE, PA as Physician Assistant (Gastroenterology)  Pia Sharper, MD as Consulting Physician (Cardiovascular Disease)  Fix, Cena Camps, MD as Evaluating Hepatologist (Gastroenterology)  Dotti Harvest ORN, RN as Transplant Coordinator (Transplant)  Perley Bruno SAILOR as Case Manager/Social Worker (Transplant)  Gretta Dennison SAILOR as Transplant Financial Coordinator (Transplant)  Cayenne Breault, Leita Linsey, MD as Referring Physician (Family Medicine)  Rosaland Raisin, PsyD as Transplant Psychologist (Transplant Surgery)  Jama Old, MD as Consulting Physician (Cardiology)    Past Medical History[1]  Problem List[2]  OB History       Gravida   4    Para   4    Term   4    Preterm        AB        Living             SAB        IAB        Ectopic        Molar        Multiple        Live Births   4  Past Surgical History[3]    Allergies:  Shellfish containing products, Glipizide , and Lisinopril     Current Medications[4]  Social History[5]  Family History[6]  Immunization History   Administered Date(s) Administered    COVID-19 VAC,MRNA,TRIS(12Y UP)(PFIZER)(GRAY CAP) 08/19/2020    COVID-19 VACC,MRNA,(PFIZER)(PF) 10/02/2019, 10/23/2019    Covid-19 Vac, (77yr+) (Comirnaty) Mrna Pfizer  04/08/2022, 07/14/2023, 04/29/2024    HEPATITIS B VACCINE ADULT, ADJUVANTED, IM(HEPLISAV B) 06/17/2022, 09/26/2022    Hepatitis A (Adult) 06/17/2022, 09/26/2022    INFLUENZA INJ MDCK PF, QUAD,(FLUCELVAX)(37MO AND UP EGG FREE) 05/15/2021    INFLUENZA TIV (TRI) PF (IM)(HISTORICAL) 04/09/2011    INFLUENZA VACCINE IIV3(IM)(PF)6 MOS UP 06/12/2023, 04/29/2024    Influenza TRI (IIV3) 5+yrs MDV 06/04/2013    Influenza Vaccine Quad(IM)6 MO-Adult(PF) 05/08/2017, 04/09/2018, 03/11/2019    Influenza Virus Vaccine, unspecified formulation 04/08/2015, 04/07/2016, 05/04/2020, 04/08/2022    PCV21 (Capvaxive) (Pneumococcal 21-valent Conjugate Vaccine) 09/29/2023 PNEUMOCOCCAL POLYSACCHARIDE 23-VALENT 07/19/2014    Pneumococcal Conjugate 20-valent 06/17/2022    RSV VACCINE, BIVALENT(PF)(ABRYSVO) 10/08/2023    SHINGRIX-ZOSTER VACCINE (HZV),RECOMBINANT,ADJUVANTED(IM) 11/09/2020, 05/15/2021    TdaP 07/16/2011, 06/17/2022       Health Maintenance   Topic Date Due    Foot Exam  06/26/2024    Urine Albumin/Creatinine Ratio  06/26/2024    COVID-19 Vaccine (7 - Pfizer risk 2025-26 season) 10/28/2024    Mammogram  10/28/2024    Hemoglobin A1c  12/22/2024    Retinal Eye Exam  04/01/2025    Serum Creatinine Monitoring  07/05/2025    Potassium Monitoring  07/05/2025    Colon Cancer Screening  08/14/2028    DTaP/Tdap/Td Vaccines (3 - Td or Tdap) 06/17/2032    Pneumococcal Vaccine 50+  Completed    Hepatitis C Screen  Completed    Influenza Vaccine  Completed    Zoster Vaccines  Completed       I have reviewed and updated the patient's problem list, medications, allergies, past medical and surgical history, social and family history     Review of Systems:  As per HPI     Physical Exam:  Vital Signs:  Vitals:    07/05/24 1059   BP: 100/60   Pulse: 78   Resp: 14   Temp: 36.8 ??C (98.2 ??F)   SpO2: 97%     Height: 167.6 cm (5' 6)    Body mass index is 33.27 kg/m??.  Wt Readings from Last 3 Encounters:   07/05/24 93.5 kg (206 lb 1.6 oz)   06/24/24 (P) 90.8 kg (200 lb 1.6 oz)   06/22/24 89.3 kg (196 lb 14.4 oz)     No LMP recorded. Patient has had a hysterectomy.    General: PALE, fatigued appearing, no acute distress. See BMI.  Head: Normocephalic, Atraumatic  Eyes: Sclera and conjunctiva clear, no drainage.PERRL, EOMI bilaterally.  Ears: External ears normal, Canals clear,  TMs with normal light reflex  Mouth mucus membranes moist, posterior oropharynx without erythema or exudates. .  Neck: Supple, normal ROM, no thyromegaly.  Lymph Nodes: No cervical or supraclavicular lymphadenopathy.  Skin: No rashes or obvious concerning lesions on exposed skin.  Cardiovascular: RRR, normal S1/S2, no murmurs, rubs or gallops. No chest wall TTP.  Lungs: CTA bilaterally, without crackles/wheezes/rhonchi, good air movement, no increased WOB  Abdomen:  mildly distended, mildly TTP right upper quadrant. Slightly hyperactive bowel sounds. Soft, non-distended, TENDER, no HSM or masses. Normal bowel sounds.  Extremities: nonpitting 1+ edema just at ankles and just above ankles. pulses 2 +  PT DP   Musculoskeletal:  Normal tone UEs/LEs  Neurologic: CNs 2-12 grossly intact, balance normal. Gross motor/fine motor normal. A & O x3  with no focal deficits.   Psychiatry: Pleasant affect, well kept, no abnormalities.      Imaging Studies:  No imaging ordered this visit.    Labs:  Recent Results (from the past 4 weeks)   AFP tumor marker    Collection Time: 06/22/24  4:07 PM   Result Value Ref Range    AFP-Tumor Marker 4 <=8 ng/mL   CBC    Collection Time: 06/22/24  4:07 PM   Result Value Ref Range    WBC 6.6 3.6 - 11.2 10*9/L    RBC 2.92 (L) 3.95 - 5.13 10*12/L    HGB 6.5 (L) 11.3 - 14.9 g/dL    HCT 79.5 (L) 65.9 - 44.0 %    MCV 70.0 (L) 77.6 - 95.7 fL    MCH 22.1 (L) 25.9 - 32.4 pg    MCHC 31.6 (L) 32.0 - 36.0 g/dL    RDW 82.8 (H) 87.7 - 15.2 %    MPV 11.3 (H) 6.8 - 10.7 fL    Platelet 73 (L) 150 - 450 10*9/L   PT-INR    Collection Time: 06/22/24  4:07 PM   Result Value Ref Range    PT 16.0 (H) 9.9 - 12.6 sec    INR 1.41    Comprehensive Metabolic Panel    Collection Time: 06/22/24  4:07 PM   Result Value Ref Range    Sodium 139 135 - 145 mmol/L    Potassium 4.0 3.5 - 5.1 mmol/L    Chloride 100 98 - 107 mmol/L    CO2 28.0 20.0 - 31.0 mmol/L    Anion Gap 11 5 - 14 mmol/L    BUN 25 (H) 9 - 23 mg/dL    Creatinine 8.65 (H) 0.55 - 1.02 mg/dL    BUN/Creatinine Ratio 19     eGFR CKD-EPI (2021) Female 46 (L) >=60 mL/min/1.61m2    Glucose 146 70 - 179 mg/dL    Calcium 9.2 8.7 - 89.5 mg/dL    Albumin 2.6 (L) 3.4 - 5.0 g/dL    Total Protein 6.1 5.7 - 8.2 g/dL    Total Bilirubin 2.6 (H) 0.3 - 1.2 mg/dL    AST 70 (H) <=65 U/L    ALT 63 (H) 10 - 49 U/L    Alkaline Phosphatase 93 46 - 116 U/L   Phosphatidylethanol (PEth)    Collection Time: 06/22/24  4:07 PM   Result Value Ref Range    PEth 16:0/18:1 (POPEth) by LC-MS/MS <10 Cutoff: 10 ng/mL    PEth 16:0/18:2 (PLPEth) by LC-MS/MS <10 Cutoff: 10 ng/mL    PEth Interpretation Negative.    ECG 12 Lead    Collection Time: 06/23/24  3:04 PM   Result Value Ref Range    EKG Systolic BP  mmHg    EKG Diastolic BP  mmHg    EKG Ventricular Rate 78 BPM    EKG Atrial Rate 78 BPM    EKG P-R Interval 146 ms    EKG QRS Duration 78 ms    EKG Q-T Interval 454 ms    EKG QTC Calculation 517 ms    EKG Calculated P Axis 80 degrees    EKG Calculated R Axis 17 degrees    EKG Calculated T Axis 38 degrees    QTC Fredericia 495 ms   Comprehensive metabolic panel    Collection Time: 06/23/24  3:13 PM  Result Value Ref Range    Sodium 139 135 - 145 mmol/L    Potassium 3.3 (L) 3.4 - 4.8 mmol/L    Chloride 99 98 - 107 mmol/L    CO2 25.5 20.0 - 31.0 mmol/L    Anion Gap 15 (H) 5 - 14 mmol/L    BUN 24 (H) 9 - 23 mg/dL    Creatinine 8.58 (H) 0.55 - 1.02 mg/dL    BUN/Creatinine Ratio 17     eGFR CKD-EPI (2021) Female 43 (L) >=60 mL/min/1.75m2    Glucose 145 70 - 179 mg/dL    Calcium 9.1 8.7 - 89.5 mg/dL    Albumin 2.5 (L) 3.4 - 5.0 g/dL    Total Protein 6.0 5.7 - 8.2 g/dL    Total Bilirubin 1.9 (H) 0.3 - 1.2 mg/dL    AST 66 (H) <=65 U/L    ALT 53 (H) 10 - 49 U/L    Alkaline Phosphatase 87 46 - 116 U/L   CBC w/ Differential    Collection Time: 06/23/24  3:13 PM   Result Value Ref Range    WBC 5.9 3.6 - 11.2 10*9/L    RBC 2.69 (L) 3.95 - 5.13 10*12/L    HGB 5.8 (L) 11.3 - 14.9 g/dL    HCT 80.9 (L) 65.9 - 44.0 %    MCV 70.4 (L) 77.6 - 95.7 fL    MCH 21.6 (L) 25.9 - 32.4 pg    MCHC 30.6 (L) 32.0 - 36.0 g/dL    RDW 82.4 (H) 87.7 - 15.2 %    MPV 10.6 6.8 - 10.7 fL    Platelet 65 (L) 150 - 450 10*9/L    nRBC 0 <=4 /100 WBCs    Neutrophils % 67.3 %    Lymphocytes % 23.7 %    Monocytes % 7.0 %    Eosinophils % 1.4 %    Basophils % 0.6 % Absolute Neutrophils 4.0 1.8 - 7.8 10*9/L    Absolute Lymphocytes 1.4 1.1 - 3.6 10*9/L    Absolute Monocytes 0.4 0.3 - 0.8 10*9/L    Absolute Eosinophils 0.1 0.0 - 0.5 10*9/L    Absolute Basophils 0.0 0.0 - 0.1 10*9/L    Microcytosis Moderate (A) Not Present    Anisocytosis Slight (A) Not Present   hsTroponin I (single, no delta)    Collection Time: 06/23/24  3:13 PM   Result Value Ref Range    hsTroponin I 6 <=34 ng/L   Pro-BNP    Collection Time: 06/23/24  3:13 PM   Result Value Ref Range    PRO-BNP 972.0 (H) <=300.0 pg/mL   Magnesium     Collection Time: 06/23/24  3:13 PM   Result Value Ref Range    Magnesium  1.6 1.6 - 2.6 mg/dL   Hemoglobin J8r    Collection Time: 06/23/24  3:13 PM   Result Value Ref Range    Hemoglobin A1C 5.3 4.8 - 5.6 %    Estimated Average Glucose 105 mg/dL   Prepare RBC    Collection Time: 06/23/24  4:03 PM   Result Value Ref Range    PRODUCT CODE [id number]     Product ID Red Blood Cells     Specimen Expiration Date 20251220235900     Status Transfused     Unit # [id number]     Unit Blood Type A Pos     ISBT Number 6200     Crossmatch Compatible     PRODUCT CODE [id number]     Product  ID Red Blood Cells     Specimen Expiration Date 20251220235900     Status Released     Unit # [id number]     Unit Blood Type O Pos     ISBT Number 5100     Crossmatch Compatible     Status Cancel    POCT Glucose    Collection Time: 06/23/24  4:05 PM   Result Value Ref Range    Glucose, POC 147 70 - 179 mg/dL   PT-INR    Collection Time: 06/23/24  4:20 PM   Result Value Ref Range    PT 17.3 (H) 9.9 - 12.6 sec    INR 1.53    PTT    Collection Time: 06/23/24  4:20 PM   Result Value Ref Range    APTT 28.7 24.8 - 38.4 sec    Heparin  Correlation 0.2    Type and Screen with Confirmation ABORh    Collection Time: 06/23/24  4:20 PM   Result Value Ref Range    Blood Type A POS     Antibody Screen NEG    CBC    Collection Time: 06/24/24  1:51 AM   Result Value Ref Range    WBC 4.8 3.6 - 11.2 10*9/L    RBC 2.74 (L) 3.95 - 5.13 10*12/L    HGB 6.5 (L) 11.3 - 14.9 g/dL    HCT 80.0 (L) 65.9 - 44.0 %    MCV 72.6 (L) 77.6 - 95.7 fL    MCH 23.5 (L) 25.9 - 32.4 pg    MCHC 32.4 32.0 - 36.0 g/dL    RDW 81.1 (H) 87.7 - 15.2 %    MPV 11.2 (H) 6.8 - 10.7 fL    Platelet 44 (L) 150 - 450 10*9/L   Iron  Panel    Collection Time: 06/24/24  1:51 AM   Result Value Ref Range    Iron  58 50 - 170 ug/dL    TIBC 702.5 749.9 - 574.9 ug/dL    Transferrin 763.9 (L) 250.0 - 380.0 mg/dL    Iron  Saturation (%) 20 %   Ferritin    Collection Time: 06/24/24  1:51 AM   Result Value Ref Range    Ferritin 9.2 (L) 30.0 - 270.7 ng/mL   Bilirubin, Direct    Collection Time: 06/24/24  1:51 AM   Result Value Ref Range    Bilirubin, Direct 1.00 (H) 0.00 - 0.30 mg/dL   Pathologist Smear Review    Collection Time: 06/24/24  1:51 AM   Result Value Ref Range    Pathologist Smear Interpretation  Confirmed by Hemepath Fellow, Confirmed by Hemepath Specialist/Senior Tech, Confirmed by Core Specialist/Senior Tech, To Be Accessioned - Case Report to Follow, Physician Requested Path Review - BF/CSF: cytospin routed to hemepath, Physician Requested...     Physician Requested Path Review - PB:  prepared slide and instrument printout routed to hemepath   Hematopathology Order    Collection Time: 06/24/24  1:51 AM   Result Value Ref Range    Diagnosis       Peripheral blood, smear review  -  Microcytic hypochromic anemia and thrombocytopenia (see Comment)  -  Non-specific morphologic abnormalities (see Microscopic Description)    This electronic signature is attestation that the pathologist personally reviewed the submitted material(s) and the final diagnosis reflects that evaluation.      Diagnosis Comment       The red blood cell morphologic findings are consistent with iron  deficiency.  Additional considerations  include other nutritional deficiencies (copper/zinc )  and anemia of chronic disease/inflammation.  Considerations for the thrombocytopenia include autoimmune diseases such as ITP, splenomegaly, toxins, medications, infections, and primary bone marrow disorders. No blasts, dysplasia, or schistocytes were seen.         Clinical History       The patient is a 58 year old female with a history of cirrhosis, hyperlipidemia, portal hypertension, pruritus, type 2 diabetes, lobular carcinoma in situ, congestive heart failure, obesity, and depression who presents with recurrent falls and anemia. Pathologist's review of the peripheral blood smear is requested.        Gross Description       Received: EDTA tube of peripheral blood with a request for Pathologist review of blood smear. A blood smear is prepared and stained for review. Flow cytometry is not performed.          Microscopic Description       Microscopic examination substantiates the above diagnosis.    Laboratory Data:    [facility] manual differential results.   NEUT:   64%  LYMPH: 25%  MONO:  10  EOS:     1%)  BASO:   0%    Peripheral Blood:  Platelets: Decreased in number with occasional larger forms; platelet clumps and fibrin strands are not appreciated  Erythroid: Microcytic anemia with mild anisopoikilocytosis; fine basophilic stipplings; few ovalocytes and irregularly contracted forms; negative for schistocytes   Leukocytes: Unremarkable ; negative for blasts, overtly dysplastic granulocytes, morphologically abnormal lymphoid cells, and circulating plasma cells     Resident Physician: None Assigned      EMBEDDED IMAGES      Disclaimer       Unless otherwise specified, specimens are preserved using 10% neutral buffered formalin. For cases in which immunohistochemical and/or in-situ hybridization stains are performed, the following statement applies: Appropriate controls for each stain (positive controls with or without negative controls) have been evaluated and stain as expected. These stains have not been separately validated for use on decalcified specimens and should be interpreted with caution in that setting. Some of the reagents used for these stains may be classified as analyte specific reagents (ASR). Tests using ASRs were developed, and their performance characteristics were determined, by the Anatomic Pathology Department [facility]). They have not been cleared or approved by the [place]  Food and Drug Administration (FDA). The FDA does not require these tests to go through premarket FDA review. These tests are used for clinical purposes. They should not be regarded as investigational or for research. This laboratory is certified under the Clinical Laboratory Improvement Amendments (CLIA) as qualified to perform high complexity clinical laboratory testing.     Prepare RBC    Collection Time: 06/24/24  5:33 AM   Result Value Ref Range    PRODUCT CODE [id number]     Product ID Red Blood Cells     Specimen Expiration Date 20251220235900     Status Transfused     Unit # [id number]     Unit Blood Type A Pos     ISBT Number 6200     Crossmatch Compatible    Haptoglobin    Collection Time: 06/24/24  7:20 AM   Result Value Ref Range    Haptoglobin <10 (L) 30 - 200 mg/dL   Direct Antiglobulin Test    Collection Time: 06/24/24  7:20 AM   Result Value Ref Range    Direct [place] Anti-Human Globulin NEG    Folate Level  Collection Time: 06/24/24  7:20 AM   Result Value Ref Range    Folate 21.0 >=5.4 ng/mL   CBC    Collection Time: 06/24/24  7:20 AM   Result Value Ref Range    WBC 5.0 3.6 - 11.2 10*9/L    RBC 3.04 (L) 3.95 - 5.13 10*12/L    HGB 7.4 (L) 11.3 - 14.9 g/dL    HCT 77.1 (L) 65.9 - 44.0 %    MCV 75.0 (L) 77.6 - 95.7 fL    MCH 24.3 (L) 25.9 - 32.4 pg    MCHC 32.4 32.0 - 36.0 g/dL    RDW 79.6 (H) 87.7 - 15.2 %    MPV 11.1 (H) 6.8 - 10.7 fL    Platelet 42 (L) 150 - 450 10*9/L   Vitamin B12 Level    Collection Time: 06/24/24  7:59 AM   Result Value Ref Range    Vitamin B-12 1,871 (H) 211 - 911 pg/ml   Basic Metabolic Panel    Collection Time: 06/24/24  7:59 AM   Result Value Ref Range    Sodium 143 135 - 145 mmol/L Potassium 3.8 3.5 - 5.1 mmol/L    Chloride 101 98 - 107 mmol/L    CO2 27.3 20.0 - 31.0 mmol/L    Anion Gap 15 (H) 5 - 14 mmol/L    BUN 23 9 - 23 mg/dL    Creatinine 8.69 (H) 0.55 - 1.02 mg/dL    BUN/Creatinine Ratio 18     eGFR CKD-EPI (2021) Female 48 (L) >=60 mL/min/1.55m2    Glucose 87 70 - 179 mg/dL    Calcium 8.7 8.7 - 89.5 mg/dL   Lactate dehydrogenase    Collection Time: 06/24/24  7:59 AM   Result Value Ref Range    LDH 278 (H) 120 - 246 U/L   Type and Screen with Confirmation ABORh    Collection Time: 06/24/24 10:29 AM   Result Value Ref Range    Reject/Recollect REJECT    PT-INR    Collection Time: 06/24/24 10:30 AM   Result Value Ref Range    PT 16.8 (H) 9.9 - 12.6 sec    INR 1.49    Basic Metabolic Panel    Collection Time: 06/25/24  6:31 AM   Result Value Ref Range    Sodium 146 (H) 135 - 145 mmol/L    Potassium 3.5 3.4 - 4.8 mmol/L    Chloride 104 98 - 107 mmol/L    CO2 25.0 20.0 - 31.0 mmol/L    Anion Gap 17 (H) 5 - 14 mmol/L    BUN 14 9 - 23 mg/dL    Creatinine 8.86 (H) 0.55 - 1.02 mg/dL    BUN/Creatinine Ratio 12     eGFR CKD-EPI (2021) Female 57 (L) >=60 mL/min/1.51m2    Glucose 135 70 - 179 mg/dL    Calcium 9.2 8.7 - 89.5 mg/dL   CBC    Collection Time: 06/25/24  6:32 AM   Result Value Ref Range    WBC 5.8 3.6 - 11.2 10*9/L    RBC 3.35 (L) 3.95 - 5.13 10*12/L    HGB 8.3 (L) 11.3 - 14.9 g/dL    HCT 74.5 (L) 65.9 - 44.0 %    MCV 75.8 (L) 77.6 - 95.7 fL    MCH 24.7 (L) 25.9 - 32.4 pg    MCHC 32.5 32.0 - 36.0 g/dL    RDW 79.0 (H) 87.7 - 15.2 %    MPV 11.0 (H)  6.8 - 10.7 fL    Platelet 52 (L) 150 - 450 10*9/L   Comprehensive Metabolic Panel    Collection Time: 07/05/24 12:28 PM   Result Value Ref Range    Sodium 141 135 - 145 mmol/L    Potassium 3.8 3.4 - 4.8 mmol/L    Chloride 101 98 - 107 mmol/L    CO2 27.0 20.0 - 31.0 mmol/L    Anion Gap 13 5 - 14 mmol/L    BUN 15 9 - 23 mg/dL    Creatinine 8.86 (H) 0.55 - 1.02 mg/dL    BUN/Creatinine Ratio 13     eGFR CKD-EPI (2021) Female 57 (L) >=60 mL/min/1.73m2 Glucose 148 70 - 179 mg/dL    Calcium 8.7 8.7 - 89.5 mg/dL    Albumin 2.6 (L) 3.4 - 5.0 g/dL    Total Protein 6.1 5.7 - 8.2 g/dL    Total Bilirubin 1.7 (H) 0.3 - 1.2 mg/dL    AST 76 (H) <=65 U/L    ALT 49 10 - 49 U/L    Alkaline Phosphatase 116 46 - 116 U/L   Ferritin    Collection Time: 07/05/24 12:28 PM   Result Value Ref Range    Ferritin 12.3 (L) 30.0 - 270.7 ng/mL   Reticulocytes    Collection Time: 07/05/24 12:28 PM   Result Value Ref Range    Reticulocyte Auto % 2.50 (H) 0.50 - 2.17 %    Absolute Auto Reticulocyte 85.2 23.0 - 100.0 10*9/L   CBC w/ Differential    Collection Time: 07/05/24 12:28 PM   Result Value Ref Range    WBC 5.8 3.6 - 11.2 10*9/L    RBC 3.41 (L) 3.95 - 5.13 10*12/L    HGB 8.4 (L) 11.3 - 14.9 g/dL    HCT 73.7 (L) 65.9 - 44.0 %    MCV 76.8 (L) 77.6 - 95.7 fL    MCH 24.6 (L) 25.9 - 32.4 pg    MCHC 32.0 32.0 - 36.0 g/dL    RDW 76.3 (H) 87.7 - 15.2 %    MPV 10.2 6.8 - 10.7 fL    Platelet 59 (L) 150 - 450 10*9/L    Neutrophils % 77.9 %    Lymphocytes % 13.5 %    Monocytes % 6.4 %    Eosinophils % 1.3 %    Basophils % 0.9 %    Absolute Neutrophils 4.6 1.8 - 7.8 10*9/L    Absolute Lymphocytes 0.8 (L) 1.1 - 3.6 10*9/L    Absolute Monocytes 0.4 0.3 - 0.8 10*9/L    Absolute Eosinophils 0.1 0.0 - 0.5 10*9/L    Absolute Basophils 0.1 0.0 - 0.1 10*9/L    Microcytosis Slight (A) Not Present    Anisocytosis Marked (A) Not Present    Hypochromasia Marked (A) Not Present   Morphology Review    Collection Time: 07/05/24 12:28 PM   Result Value Ref Range    Smear Review Comments See Comment Undefined   C. Difficile Assay    Collection Time: 07/05/24  2:55 PM   Result Value Ref Range    C. difficile PCR Screen Negative Negative       Assessment & Plan: Pathophysiology of condition(s) reviewed with patient if applicable. Pt stated understanding and there were no barriers to learning.  SEE PATIENT INSTRUCTIONS BELOW FOR DISCUSSION/PLAN  Assessment & Plan  Chronic diarrhea  Possible C. difficile infection considered.  - Ordered stool sample for C. difficile testing.  - Provided stool kit for  C. difficile and blood testing.    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 Anemia may contribute to fluid retention. Low blood pressure possibly related to fluid status.  - Continue Bumex  and spironolactone  as prescribed.  - Monitor blood pressure and fluid status.  - Encouraged use of compression hose if available.    Anemia  Recent hospitalization for anemia. Currently on iron  supplementation. Anemia may contribute to fluid retention.  - Ordered blood count to assess anemia status.  - Advised ER visit if anemia worsens for potential transfusion.    Type 2 diabetes mellitus  No recent blood sugar monitoring since hospital discharge.    General Health Maintenance  Flu shot requested and agreed upon.  - Administered flu shot.     Diagnosis ICD-10-CM Associated Orders   1. Type 2 diabetes mellitus without complication, without long-term current use of insulin  (CMS-HCC)  E11.9 Microalbumin / creatinine urine ratio     Comprehensive Metabolic Panel     CBC w/ Differential     Ferritin     Reticulocytes      2. Acute blood loss anemia  D62 Comprehensive Metabolic Panel     CBC w/ Differential     Ferritin     Reticulocytes      3. Thrombocytopenia  D69.6 Comprehensive Metabolic Panel     CBC w/ Differential     Ferritin     Reticulocytes      4. 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)  K75.81 Comprehensive Metabolic Panel    K74.69 CBC w/ Differential     Ferritin     Reticulocytes      5. Hepatic encephalopathy    (CMS-HCC)  K76.82 Comprehensive Metabolic Panel     CBC w/ Differential     Ferritin     Reticulocytes      6. Diarrhea, unspecified type  R19.7 Comprehensive Metabolic Panel     CBC w/ Differential     Ferritin     Reticulocytes     C. Difficile Assay      7. Encounter for long-term current use of medication  Z79.899 C. Difficile Assay          Patient Instructions   SPECIFIC INSTRUCTIONS WE DISCUSSED TODAY:  VISIT SUMMARY:    During your visit, we discussed your severe diarrhea, fluid retention, anemia, and diabetes management. We also administered your flu shot as requested.    YOUR PLAN:    -CHRONIC DIARRHEA: Chronic diarrhea is frequent, watery bowel movements that can lead to dehydration and other issues. We suspect a possible C. difficile infection and have ordered a stool sample for testing. Please use the provided stool kit for C. difficile and blood testing.    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 Continue taking Bumex  and spironolactone  as prescribed to manage fluid retention. Monitor your blood pressure and fluid status regularly, and use compression hose if available.    -ANEMIA: Anemia is a condition where you don't have enough healthy red blood cells to carry adequate oxygen to your body's tissues. We have ordered a blood count to assess your anemia status. If your anemia worsens, please visit the ER for a potential transfusion.    -TYPE 2 DIABETES MELLITUS: Type 2 diabetes is a condition that affects the way your body processes blood sugar. It is important to  monitor your blood sugar levels regularly. Please resume checking your blood sugar at home. STOP METFORMIN  IF YOU HAVE NOT ALREADY DONE SO.     -GENERAL HEALTH MAINTENANCE: We administered your flu shot as requested to help protect you from the flu this season.    INSTRUCTIONS:    Please follow up with the stool sample and blood tests as ordered. Continue monitoring your blood pressure and blood sugar levels at home. If your anemia worsens, visit the ER for a potential transfusion.  Thank you for choosing [facility] for your care!    If you have a question about a recent visit or a plan that was discussed with your provider in the past: Please go to [email or web address] (or your phone app) and sign in to your [facility] Chart to send this information. My Chart messages go to my staff first. I do my best to respond to messages within 3 business days. For more urgent questions, CALLING THE OFFICE is always the best option.    If you are experiencing any new or worsening symptoms or you would like to talk about changing a medication or medical plan; it is best to schedule an appointment: Please go onto My [facility] Chart call our clinic at [phone] to schedule an appointment with your provider. If you cannot decide if your issue warrants a visit, you can always ask to discuss this with one of our clinical care team.    If you have urgent healthcare needs after normal business hours, on weekends, or during holidays:  DUE TO CIRCUMSTANCES Beyond our direct control, We NO LONGER have an Acute Care Clinic. I am not happy with this decision but cannot change it.   You can always reach the [facility] 24/7 Nursing Line after hours to get nurse advice or get directed to appropriate care outside of regular business hours. The nurse can consult with the doctor on call if indicated. Just call our main office number and follow the prompts - [phone]     You may receive a patient satisfaction survey by mail, text or email regarding your visit today. Your opinion is important to me. Your response benefits us  all :-)    If you had or will have testing done, test results will be posted on [facility] or sent via letter.  A member of our Care Team will also contact you by message or phone if your results require follow-up.Please note that specialty test results take longer. Lab results frequently post before I have the chance to review them. We will follow up as needed when I review them.    Leita HERO. Melynda Krzywicki, MD -   [facility]  [address], [place] [address]  Phone: [phone] Fax: [phone]  [email or web address]        Return in about 3 months (around 10/03/2024) for Medication Management.    Leita HERO.Waylan, MD  Family Physician    [facility]  [address]  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    Obesity     Prediabetes    [2]   Patient Active Problem List  Diagnosis    Insomnia    Vitamin deficiency    Dysthymic disorder    Restless leg syndrome    Edema    Vitamin D deficiency    Itching    Fatty liver disease, nonalcoholic    Depression    Neoplasm of right breast, 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)    Type 2 diabetes mellitus without complications (CMS-HCC)    Bowel incontinence    Obesity    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    Back pain    Neoplasm of uncertain behavior    Decompensated cirrhosis    (CMS-HCC)    Portal hypertension    (CMS-HCC)    Hepatic encephalopathy    (CMS-HCC)    Thrombocytopenia    Lower extremity edema    ASD (atrial septal defect) - s/p repair    Nausea and vomiting    Nonobstructive atherosclerosis of coronary artery    Other ascites    Secondary esophageal varices without bleeding (CMS-HCC)    Metabolic dysfunction-associated steatohepatitis (MASH)    Intention tremor    Iron  deficiency anemia    Anemia    Recurrent falls    Hypokalemia   [3]   Past Surgical History:  Procedure Laterality Date    ABDOMINAL WALL MESH  REMOVAL  1997, 2005    Placement, 1997, removal and replacement 2005    BLADDER SURGERY      bladder tact X2    BREAST BIOPSY Right 04/2013 benign    BREAST EXCISIONAL BIOPSY Right 05/2013    benign    BREAST SURGERY Right     precancerous spot removed    CHOLECYSTECTOMY      HYSTERECTOMY      IR EMBOLIZATION VENOUS OTHER THAN HEMORRHAGE  02/17/2023    IR EMBOLIZATION VENOUS OTHER THAN HEMORRHAGE 02/17/2023 Babara Lash, MD IMG VIR H[facility]    PR COLONOSCOPY FLX DX W/COLLJ SPEC WHEN PFRMD N/A 08/15/2023    Procedure: COLONOSCOPY, FLEXIBLE, PROXIMAL TO SPLENIC FLEXURE; DIAGNOSTIC, W/WO COLLECTION SPECIMEN BY BRUSH OR WASH;  Surgeon: Charlanne Kipper, MD;  Location: [facility];  Service: Gastrointestinal    PR COLSC FLX W/RMVL OF TUMOR POLYP LESION SNARE TQ N/A 10/14/2016    Procedure: COLONOSCOPY FLEX; W/REMOV TUMOR/LES BY SNARE;  Surgeon: Eleanor Dewey Sorrel, MD;  Location: [facility];  Service: Gastroenterology    PR COLSC FLX W/RMVL OF TUMOR POLYP LESION SNARE TQ N/A 01/15/2023    Procedure: COLONOSCOPY FLEX; W/REMOV TUMOR/LES BY SNARE;  Surgeon: Fae Alto, MD;  Location: [facility];  Service: Gastroenterology    PR GI IMAG INTRALUMINAL ESOPHAGUS-ILEUM W/I&R N/A 08/15/2023    Procedure: GI VIDEO TRACT IMAGE INTRALUMINAL (EG, CAPSULE ENDOSCOPY), ESOPHAGUS VIA ILEUM, PHYSICIAN INTERPRETATION & REPORT;  Surgeon: Charlanne Kipper, MD;  Location: [facility];  Service: Gastrointestinal    PR PERC CLOS,CONG INTERATRIAL COMMUN W/IMPL N/A 09/12/2022    Procedure: ASD closure;  Surgeon: Pia Sharper, MD;  Location: [facility] CATH;  Service: Cardiology    PR UPPER GI ENDOSCOPY,BIOPSY N/A 01/15/2023    Procedure: UGI ENDOSCOPY; WITH BIOPSY, SINGLE OR MULTIPLE;  Surgeon: Fae Alto, MD;  Location: [facility];  Service: Gastroenterology  PR UPPER GI ENDOSCOPY,DIAGNOSIS N/A 08/14/2023    Procedure: UGI ENDO, INCLUDE ESOPHAGUS, STOMACH, & DUODENUM &/OR JEJUNUM; DX W/WO COLLECTION SPECIMN, BY BRUSH OR WASH;  Surgeon: Charlanne Kipper, MD;  [facility]: [facility];  Service: Gastrointestinal SKIN BIOPSY     [4]   Current Outpatient Medications   Medication Sig Dispense Refill    atorvastatin  (LIPITOR ) 20 MG tablet TAKE ONE TABLET BY MOUTH ONCE DAILY 90 tablet 1    blood sugar diagnostic (GLUCOSE BLOOD) Strp Use to test blood sugar daily 100 strip 3    blood-glucose meter kit Use to test blood sugar DAILY 1 each 1    bumetanide  (BUMEX ) 1 MG tablet TAKE 2 TABLETS BY MOUTH ONCE DAILY 60 tablet 11    cholecalciferol, vitamin D3 25 mcg, 1,000 units,, 1,000 unit (25 mcg) tablet Take 2 tablets (50 mcg total) by mouth daily.      cholecalciferol, vitamin D3-25 mcg, 1,000 unit,, 25 mcg (1,000 unit) capsule Take 2 capsules (50 mcg total) by mouth daily.      DAILY-VITE, WITH FOLIC ACID, 400 mcg Tab tablet Take 1 tablet by mouth daily.      ferrous sulfate  325 (65 FE) MG tablet Take 1 tablet (325 mg total) by mouth every other day. 45 tablet 1    inhalational spacing device (AEROCHAMBER MV) Spcr Use as directed with inhalers 1 each 0    lancets Misc Use to test blood sugar daily 100 each 3    loratadine  (CLARITIN ) 10 mg tablet TAKE ONE TABLET BY MOUTH ONCE DAILY 90 tablet 1    multivitamin (TAB-A-VITE/THERAGRAN) per tablet Take 1 tablet by mouth daily. 90 tablet 3    naltrexone  (DEPADE) 50 mg tablet TAKE ONE TABLET BY MOUTH ONCE DAILY 30 tablet 11    omeprazole  (PRILOSEC) 20 MG capsule TAKE 1 CAPSULE BY MOUTH TWICE DAILY 30MINS BEFORE MEALS 180 capsule 1    ondansetron  (ZOFRAN ) 8 MG tablet Take 1 tablet (8 mg total) by mouth every eight (8) hours as needed for nausea. 30 tablet 2    OZEMPIC  2 mg/dose (8 mg/3 mL) PnIj INJECT 2MG  under the skin once weekly (every 7 days) 9 mL 2    rifAXIMin  (XIFAXAN ) 550 mg Tab Take 1 tablet (550 mg total) by mouth two (2) times a day. 180 tablet 3    sertraline  (ZOLOFT ) 100 MG tablet Take 1 tablet (100 mg total) by mouth daily. 90 tablet 3    spironolactone  (ALDACTONE ) 100 MG tablet TAKE 2 TABLETS BY MOUTH ONCE DAILY 30 tablet 11    lactulose  (CEPHULAC ) 20 gram packet Mix and take 2 packets (40 g total) by mouth Three (3) times a day. (Patient not taking: Reported on 07/05/2024) 540 packet 3     No current facility-administered medications for this visit.   [5]   Social History  Socioeconomic History    Marital status: Married     Spouse name: None    Number of children: None    Years of education: None    Highest education level: None   Tobacco Use    Smoking status: Never     Passive exposure: Past    Smokeless tobacco: Never   Vaping Use    Vaping status: Never Used   Substance and Sexual Activity    Alcohol use: No    Drug use: No    Sexual activity: Not Currently     Birth control/protection: Abstinence, None   Other Topics Concern  Do you use sunscreen? Yes    Excessive sun exposure? Yes   Social History Narrative    2 births. No living children. Married new husband 2014.         02/21/2017        [facility] Components:        Family, social, cultural characteristics: social support includes best friend .      Patient has the following communication needs: none, per patient     Health Literacy: How confident are you that you understand your health issues/concerns, can participate in your care, and manage your care along with your physician: confident.    Behaviors Affecting Health: none, per patient    Family history of mental health illness and/or substance abuse: asked patient/parent and none disclosed.    Have you been seen by any medical provider that we have not referred you to since your last visit ? No    Discussed a Living Will with the patient and uyz:Ejupzwu is under the age of 67.      Social Drivers of Health     Food Insecurity: No Food Insecurity (06/24/2024)    Hunger Vital Sign     Worried About Running Out of Food in the Last Year: Never true     Ran Out of Food in the Last Year: Never true   Tobacco Use: Low Risk (07/05/2024)    Patient History     Smoking Tobacco Use: Never     Smokeless Tobacco Use: Never     Passive Exposure: Past   Transportation Needs: No Transportation Needs (06/24/2024)    PRAPARE - Transportation     Lack of Transportation (Medical): No     Lack of Transportation (Non-Medical): No   Alcohol Use: Not At Risk (08/14/2023)    Alcohol Use     How often do you have a drink containing alcohol?: Never     How many drinks containing alcohol do you have on a typical day when you are drinking?: 1 - 2     How often do you have 5 or more drinks on one occasion?: Never   Housing: Low Risk (06/24/2024)    Housing     Within the past 12 months, have you ever stayed: outside, in a car, in a tent, in an overnight shelter, or temporarily in someone else's home (i.e. couch-surfing)?: No     Are you worried about losing your housing?: No   Utilities: Low Risk (06/24/2024)    Utilities     Within the past 12 months, have you been unable to get utilities (heat, electricity) when it was really needed?: No   Interpersonal Safety: Not At Risk (06/24/2024)    Interpersonal Safety     Unsafe Where You Currently Live: No     Physically Hurt by Anyone: No     Abused by Anyone: No   Financial Resource Strain: Low Risk (06/24/2024)    Overall Financial Resource Strain (CARDIA)     Difficulty of Paying Living Expenses: Not very hard   [6]   Family History  Problem Relation Age of Onset    Heart disease Mother     Hypertension Mother     COPD Mother     Heart disease Father     Kidney disease Father     Allergies Father     Gout Father     Diabetes Father     Allergies Other         FAMILY  H/O    Coronary artery disease Other         FAMILY H/O    Kidney failure Other         FAMILY H/O    No Known Problems Sister     No Known Problems Daughter     No Known Problems Maternal Grandmother     No Known Problems Maternal Grandfather     No Known Problems Paternal Grandmother     No Known Problems Paternal Grandfather     Alcohol abuse Maternal Uncle     Birth defects Son     Birth defects Son     BRCA 1/2 Neg Hx     Breast cancer Neg Hx     Cancer Neg Hx     Colon cancer Neg Hx Endometrial cancer Neg Hx     Ovarian cancer Neg Hx     Substance Abuse Disorder Neg Hx     Mental illness Neg Hx

## 2024-07-09 ENCOUNTER — Inpatient Hospital Stay: Admit: 2024-07-09 | Discharge: 2024-07-09 | Payer: PRIVATE HEALTH INSURANCE

## 2024-07-09 MED ADMIN — gadopiclenol (ELUCIREM,VUEWAY) injection 9.35 mL: .1 mL/kg | INTRAVENOUS | @ 22:00:00 | Stop: 2024-07-09

## 2024-07-13 ENCOUNTER — Ambulatory Visit
Admit: 2024-07-13 | Discharge: 2024-07-14 | Payer: PRIVATE HEALTH INSURANCE | Attending: Transplant Hepatology | Primary: Transplant Hepatology

## 2024-07-13 DIAGNOSIS — K7460 Unspecified cirrhosis of liver: Principal | ICD-10-CM

## 2024-07-13 DIAGNOSIS — K7290 Hepatic failure, unspecified without coma: Principal | ICD-10-CM

## 2024-07-13 NOTE — Patient Instructions (Addendum)
 I have placed an order in your record to get you scheduled for a upper endoscopy to look for varices now that you are not on the carvedilol . Call [phone], Option 2 in order to get your procedure scheduled.     We will see you again in 3 months.

## 2024-07-13 NOTE — Progress Notes (Signed)
 [facility]  07/13/2024    Reason for visit: Follow up for Decompensated cirrhosis    (CMS-HCC) [K72.90, K74.60]    59 y.o. 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 MELD 17, approved for liver transplantation pending increase in MELD. No longer has a living donor option. Experiencing fatigue given inverted sleep pattern, though improved fall frequency in the discontinuation of carvedilol .    Portal hypertension    (CMS-HCC) - Secondary esophageal varices without bleeding (CMS-HCC)  Due for repeat EGD given off carvedilol     Hepatic encephalopathy    (CMS-HCC)  Continue lactulose  and rifaximin   Consider splenorenal shunt closure (identified on MRI 07/2024) if refractory    Poor sleep impacting QOL  Palliative referral    Frequent falls, improved  Continue to hold carvedilol  given improvement  Continue with PT     Decompensated cirrhosis    (CMS-HCC) - Metabolic dysfunction-associated steatohepatitis (MASH)  Continue Ozempic  for diabetes mellitus and obesity  Up to date on HCC screening  Up to date on vaccinations    Other ascites/edema  Continue diuretics (bumetanide  1 mg daily, spironolactone  200 mg daily), unable to increase doses due to kidney dysfunction  Low sodium diet    Itching  Continue naltrexone     Return in about 3 months (around 10/11/2024).    Subjective   Interval:  Hospitalized after a fall, hgb 5.8, now sp transfusion with 2 checks and Hgb 8    Accompanied by: son-in-law    Today:  No jaundice, edema, bleeding, or acute confusion since last visit. She is adherent to medications, (organized in pill packs) but poor sleep is biggest quality of life problem, reiterated by her son-in-law. Had some small swelling in legs, that has improved on home diuretics. 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 Initial history that poor cognition resulted in needing to leave work (at [employer], left 2 years ago because could not remember or think through things in a timely manner) and that the cognition had not improved with medications nor embolization. Secondary history endorses improvement of cognition.  During this secondary interview she also wondered if she needed to be seen so frequently or could just be seen as needed, ultimately agreeing to return in 3 months.    Objective   BP 138/63  - Pulse 97  - Temp 36.8 ??C (98.2 ??F) (Tympanic)  - Ht 167.6 cm (5' 5.98)  - Wt 92.8 kg (204 lb 8 oz)  - SpO2 98%  - BMI 33.02 kg/m??     Physical Exam  CONSTITUTIONAL: No apparent distress  EYES: Anicteric sclerae  CARDIOVASCULAR: Mild peripheral edema  GASTROINTESTINAL: Soft, nontender abdomen  NEUROLOGIC: Awake, alert, and oriented to person, place, and time with speech delay, asterixis and tremulousness at rest  MSK: In wheelchair     Results  Lab Results   Component Value Date    WBC 5.8 07/05/2024    HGB 8.4 (L) 07/05/2024    MCV 76.8 (L) 07/05/2024    PLT 59 (L) 07/05/2024    Lab Results   Component Value Date    NA 141 07/05/2024    K 3.8 07/05/2024    CL 101 07/05/2024    CREATININE 1.13 (H) 07/05/2024    GLU 148 07/05/2024    CALCIUM 8.7 07/05/2024      Lab Results   Component Value Date    ALBUMIN 2.6 (L) 07/05/2024    AST 76 (  H) 07/05/2024    ALT 49 07/05/2024    ALKPHOS 116 07/05/2024    BILITOT 1.7 (H) 07/05/2024    Lab Results   Component Value Date    INR 1.49 06/24/2024        Lab Results   Component Value Date    IRON  58 06/24/2024    FERRITIN 12.3 (L) 07/05/2024     Lab Results   Component Value Date    CHOL 138 12/22/2023    TRIG 75 12/22/2023    A1C 5.3 06/23/2024     Lab Results   Component Value Date    VITAMINB12 1,871 (H) 06/24/2024    FOLATE 21.0 06/24/2024    VITDTOTAL 49.9 04/29/2024     Lab Results   Component Value Date    HEPAIGG Nonreactive 07/14/2023    HBSAG Nonreactive 07/14/2023    HEPBSAB Reactive (A) 07/14/2023    HEPBCAB Nonreactive 07/14/2023    HEPCAB Nonreactive 07/14/2023 HIV Nonreactive 07/14/2023     Lab Results   Component Value Date    ANA Negative 05/09/2022    SMOOTHMUSCAB Negative 05/17/2022    MITOAB Negative 05/17/2022       MELD 3.0: 17 at 06/25/2024  6:31 AM  Calculated from:  Serum Creatinine: 1.13 mg/dL at 87/80/7974  3:68 AM  Serum Sodium: 146 mmol/L (Using max of 137 mmol/L) at 06/25/2024  6:31 AM  Total Bilirubin: 1.9 mg/dL at 87/82/7974  6:86 PM  Serum Albumin: 2.5 g/dL at 87/82/7974  6:86 PM  INR(ratio): 1.49 at 06/24/2024 10:30 AM  Age at listing (hypothetical): 58 years  Sex: Female at 06/25/2024  6:31 AM

## 2024-07-14 NOTE — Telephone Encounter (Signed)
 Called patient again today in regards to getting her scheduled for her liver transplant evaluation annual appointments. This time I was able to speak with her. Went over with her what appointments she would be scheduled for and asked her for days that I needed to avoid. She stated understanding and stated that she did not have any days that I needed to avoid. She did ask that I schedule her appointments for the afternoon. Let her know that the Surgeon appointment would have to be around 11am, but the rest of the appointments I could schedule in the afternoon. She stated that would be fine. Let her know that I am going to check and see if her appointments with the Social Worker, Warden/ranger, and Nutritionist could be done virtually and that those could be done on a separate day than her in person appointments. She stated understanding. I then let her know once all appointment were scheduled, that her appointment information will show in her [facility]. Again she stated understanding.

## 2024-07-21 ENCOUNTER — Ambulatory Visit: Admit: 2024-07-21 | Discharge: 2024-07-26 | Payer: PRIVATE HEALTH INSURANCE

## 2024-07-21 ENCOUNTER — Inpatient Hospital Stay
Admission: EM | Admit: 2024-07-21 | Discharge: 2024-07-26 | Disposition: A | Discharge status: Home Health | Payer: PRIVATE HEALTH INSURANCE

## 2024-07-21 ENCOUNTER — Ambulatory Visit
Admission: EM | Admit: 2024-07-21 | Discharge: 2024-07-26 | Disposition: A | Discharge status: Home Health | Payer: PRIVATE HEALTH INSURANCE

## 2024-07-21 ENCOUNTER — Encounter
Admission: EM | Admit: 2024-07-21 | Discharge: 2024-07-26 | Disposition: A | Discharge status: Home Health | Payer: PRIVATE HEALTH INSURANCE

## 2024-07-21 LAB — CBC W/ AUTO DIFF
BASOPHILS ABSOLUTE COUNT: 0.1 10*9/L (ref 0.0–0.1)
BASOPHILS RELATIVE PERCENT: 0.8 %
EOSINOPHILS ABSOLUTE COUNT: 0.1 10*9/L (ref 0.0–0.5)
EOSINOPHILS RELATIVE PERCENT: 1 %
HEMATOCRIT: 23.9 % — ABNORMAL LOW (ref 34.0–44.0)
HEMOGLOBIN: 7.7 g/dL — ABNORMAL LOW (ref 11.3–14.9)
LYMPHOCYTES ABSOLUTE COUNT: 2 10*9/L (ref 1.1–3.6)
LYMPHOCYTES RELATIVE PERCENT: 20.3 %
MEAN CORPUSCULAR HEMOGLOBIN CONC: 32.1 g/dL (ref 32.0–36.0)
MEAN CORPUSCULAR HEMOGLOBIN: 24.7 pg — ABNORMAL LOW (ref 25.9–32.4)
MEAN CORPUSCULAR VOLUME: 76.9 fL — ABNORMAL LOW (ref 77.6–95.7)
MEAN PLATELET VOLUME: 10.6 fL (ref 6.8–10.7)
MONOCYTES ABSOLUTE COUNT: 0.7 10*9/L (ref 0.3–0.8)
MONOCYTES RELATIVE PERCENT: 7.1 %
NEUTROPHILS ABSOLUTE COUNT: 7 10*9/L (ref 1.8–7.8)
NEUTROPHILS RELATIVE PERCENT: 70.8 %
NUCLEATED RED BLOOD CELLS: 0 /100{WBCs} (ref <=4)
PLATELET COUNT: 101 10*9/L — ABNORMAL LOW (ref 150–450)
RED BLOOD CELL COUNT: 3.11 10*12/L — ABNORMAL LOW (ref 3.95–5.13)
RED CELL DISTRIBUTION WIDTH: 24.6 % — ABNORMAL HIGH (ref 12.2–15.2)
WBC ADJUSTED: 9.9 10*9/L (ref 3.6–11.2)

## 2024-07-21 LAB — COMPREHENSIVE METABOLIC PANEL
ALBUMIN: 2.6 g/dL — ABNORMAL LOW (ref 3.4–5.0)
ALKALINE PHOSPHATASE: 121 U/L — ABNORMAL HIGH (ref 46–116)
ALT (SGPT): 50 U/L — ABNORMAL HIGH (ref 10–49)
ANION GAP: 18 mmol/L — ABNORMAL HIGH (ref 5–14)
AST (SGOT): 82 U/L — ABNORMAL HIGH (ref <=34)
BILIRUBIN TOTAL: 1.9 mg/dL — ABNORMAL HIGH (ref 0.3–1.2)
BLOOD UREA NITROGEN: 16 mg/dL (ref 9–23)
BUN / CREAT RATIO: 13
CALCIUM: 9.2 mg/dL (ref 8.7–10.4)
CHLORIDE: 98 mmol/L (ref 98–107)
CO2: 26.8 mmol/L (ref 20.0–31.0)
CREATININE: 1.21 mg/dL — ABNORMAL HIGH (ref 0.55–1.02)
EGFR CKD-EPI (2021) FEMALE: 52 mL/min/1.73m2 — ABNORMAL LOW (ref >=60)
GLUCOSE RANDOM: 127 mg/dL (ref 70–179)
POTASSIUM: 3.4 mmol/L (ref 3.4–4.8)
PROTEIN TOTAL: 6.3 g/dL (ref 5.7–8.2)
SODIUM: 143 mmol/L (ref 135–145)

## 2024-07-21 LAB — HIGH SENSITIVITY TROPONIN I - SERIAL: HIGH SENSITIVITY TROPONIN I: 7 ng/L (ref <=34)

## 2024-07-21 LAB — PROTIME-INR
INR: 1.43
PROTIME: 16.2 s — ABNORMAL HIGH (ref 9.9–12.6)

## 2024-07-21 LAB — SLIDE REVIEW

## 2024-07-21 LAB — HIGH SENSITIVITY TROPONIN I - 2 HOUR SERIAL
HIGH SENSITIVITY TROPONIN - DELTA (0-2H): 0 ng/L (ref <=7)
HIGH-SENSITIVITY TROPONIN I - 2 HOUR: 7 ng/L (ref <=34)

## 2024-07-21 LAB — PRO-BNP: PRO-BNP: 522 pg/mL — ABNORMAL HIGH (ref <=300.0)

## 2024-07-21 NOTE — Hospital Course (Addendum)
##  SOB - R pleural effusion  Patient presents with 2 day hx of chest discomfort and shortness of breath. Oxygen saturation ranged from high 80s to low 90s on 2 L Bull Hollow while in the emergency room. ED workup notable for large R loculated pleural effusion on CXR. Negative troponins and normal EKG. ProBNP elevated to 522 though decreased from most recent level of 972. Echo obtained revealed EF at 65-70%, with mildly increased LV wall thickness and mild mitral valve regurgitation. POCUS revealing moderate R sided pleural effusion with tethered lung. Physical exam revealed decreased breath sounds over R side. Patient diuresed appropriately with IV Bumex , and transitioned to PO prior to discharge given improvement in oxygen requirement.     ##Anemia  Patient was hospitalized from December 17 to 19 for recurrent falls and anemia. Patient was found to be anemic at 5.8 requiring 2 units PRBC. Recent Iron  panel in 12/'25 evident for Ferritin 9.2, Iron  58, TIC 297.4. At time of admission, hgb 7.7 without signs of active bleed. Patient received 1 unit pRBC transfusion on 1/15, which increased Hgb to 8.8 . Given drop of hemoglobin, Lovenox  was held. She was restarted on PO iron  every other day. Hemoglobin trended daily.     ##MASH cirrhosis - Portal Hypertension   Continued at home lactulose , rifaximin , spironolactone  with daily PT/INR    Chronic Problems     ##T2DM  - Monitored daily CMP     ##GERD  - Continued Pantoprazole  20 mg daily      ##Anxiety - MDD  - Continued Sertraline  100 mg daily

## 2024-07-21 NOTE — ED Triage Note (Signed)
 Pt arrives with c/o sob and central chest pain with onset yesterday.  Pt states she thought she had a cold.  Pt unable to speak in complete sentences.  She states elephant sitting on my chest.   Pt endorsing nonprod. Coughing and a little bilat leg swelling.

## 2024-07-21 NOTE — ED Provider Notes (Signed)
 [facility]  Emergency Department Provider Note     ED Clinical Impression     Final diagnoses:   Chest pain, unspecified type (Primary)   Dyspnea, unspecified type   Pleural effusion      Impression, Medical Decision Making, ED Course     Impression: 59 y.o. female with PMH most significant for T2DM, HLD, CHF, MASH cirrhosis who presents to the emergency department for chest discomfort and shortness of breath as detailed below.  Upon initial evaluation, patient is awake and alert, in no acute distress, overall nontoxic appearing.  Vital signs within normal limits, however patient satting 100% SpO2 on 2 L nasal cannula.  Patient notably does not have a home oxygen requirement.  Physical examination as detailed below significant for diminished right-sided breath sounds without significant tachypnea and no respiratory distress or auscultatory abnormalities otherwise.  Bilateral fine asterixis present but no other neurologic deficits appreciated.  Soft, nondistended abdomen with tenderness to palpation in the epigastric region.    DDx/MDM: Pleural effusion (cardiogenic versus secondary to ascites/portal hypertension), CHF exacerbation, URI, pneumonia, SBP.    Will proceed with CBC, CMP, PT/INR, troponin,, EKG, proBNP, COVID/RSV/influenza    ED Course as of 07/22/24 0221   Wed Jul 21, 2024   2045 Chest x-ray personally reviewed with large right sided pleural effusion.  Otherwise normal chest x-ray without focal lung consolidation or infarction, normal cardiac silhouette, no widened mediastinum, no bony abnormalities.   2104 HGB(!): 7.7   2130 Given large pleural effusion and new oxygen requirement, will page for admission at this time.   2319 Discussed patient's case with admitting medicine team who will come to evaluate the patient.     MDM Elements  Discussion of Management with other Physicians, QHP or Appropriate Source: Admitting team - medicine  Independent Interpretation of Studies: X-ray(s) - as above  I have reviewed recent and relevant previous record, including: prior ED and inpatient notes reviewed  Escalation of Care including OBS/Admission/Transfer was considered: admission  Social Determinants that significantly affected care: none  Prescription drugs considered but not prescribed: none  Diagnostic tests considered but not performed: none  ____________________________________________    The case was discussed with the attending physician, who is in agreement with the above assessment and plan.      History     Chief Complaint  Chief Complaint   Patient presents with    Chest Pain     HPI   Denise York is a 59 y.o. female with past medical history significant for T2DM, CHF, HLD, MASH cirrhosis, portal hypertension who presents to the emergency department for shortness of breath and chest discomfort. Patient explains that for the past 1 to 2 days she has had significant chest discomfort as well as shortness of breath and dry cough.  No fever, sick contacts, recent travel.  Patient has also had bilateral lower extremity swelling.  No abdominal pain, distention, nausea or vomiting.  No dysuria, hematuria, melena, hematochezia, diarrhea.    Per review of recent documentation, patient was hospitalized from December 17 to 19 for recurrent falls and anemia.  Patient was found to be anemic at 5.8 requiring 2 units PRBC.  Etiology of this anemia was suspected to be due to iron  deficiency at the time given no concern for GI bleed or other hemorrhagic sources.  Patient ultimately discharged with oral iron  supplementation.  More recently, patient followed up with [facility] liver transplant team who noted no additional interval events or abnormalities.  Patient  is currently approved for liver transplantation but does not have a living donor option.  Patient currently taking lactulose  and rifaximin  as well as budesonide  and spironolactone .    Past Medical History[1]    Past Surgical History[2]    Active Medications[3] Allergies[4]    Family History[5]    Short Social History[6]     Physical Exam     VITAL SIGNS:      Vitals:    07/21/24 2030 07/21/24 2256 07/22/24 0114 07/22/24 0115   BP: 136/59 138/60 132/56    Pulse: 90 94     Resp: 20 20     Temp: 36.8 ??C (98.2 ??F) 36.7 ??C (98.1 ??F)  36.8 ??C (98.2 ??F)   TempSrc: Oral Oral  Oral   SpO2: 100% 96% 95%    Weight:         Constitutional: Alert and oriented. No acute distress.  Elderly and frail-appearing.  Eyes: Conjunctivae are normal.  HEENT: Normocephalic and atraumatic. Conjunctivae clear. No congestion. Moist mucous membranes.   Cardiovascular: Rate as above, regular rhythm.  Low-grade systolic murmur at the left upper and right upper sternal borders.  Normal and symmetric distal pulses. Brisk capillary refill. Normal skin turgor.  Dry mucous membranes.  Bilateral lower extremity edema.  Respiratory: Normal respiratory effort.  Diminished breath sounds on the right side, normal breath sounds on the left.  No crackles, wheeze, rhonchi, or stridor.  Gastrointestinal: Soft, non-distended, tenderness to palpation in the epigastric region.  Genitourinary: Deferred.  Neurologic: Normal speech and language. No gross focal neurologic deficits are appreciated. Patient is moving all extremities equally, face is symmetric at rest and with speech.  Bilateral fine asterixis.  Skin: Skin is warm, dry and intact. No rash noted.  Psychiatric: Mood and affect are normal. Speech and behavior are normal.     Radiology     XR Chest 2 views   Final Result      Interval development of a large right-sided pleural effusion which appears loculated.                   Pertinent labs & imaging results that were available during my care of the patient were independently interpreted by me and considered in my medical decision making (see chart for details).    Portions of this record have been created using Scientist, clinical (histocompatibility and immunogenetics). Dictation errors have been sought, but may not have been identified and corrected.           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    Obesity     Prediabetes    [2]   Past Surgical History:  Procedure Laterality Date    ABDOMINAL WALL MESH  REMOVAL  1997, 2005    Placement, 1997, removal and replacement 2005    BLADDER SURGERY      bladder tact X2    BREAST BIOPSY Right 04/2013    benign    BREAST EXCISIONAL BIOPSY Right 05/2013    benign    BREAST SURGERY Right     precancerous spot removed    CHOLECYSTECTOMY  HYSTERECTOMY      IR EMBOLIZATION VENOUS OTHER THAN HEMORRHAGE  02/17/2023    IR EMBOLIZATION VENOUS OTHER THAN HEMORRHAGE 02/17/2023 Yu, Hyeon, MD IMG VIR [facility]    PR COLONOSCOPY FLX DX W/COLLJ SPEC WHEN PFRMD N/A 08/15/2023    Procedure: COLONOSCOPY, FLEXIBLE, PROXIMAL TO SPLENIC FLEXURE; DIAGNOSTIC, W/WO COLLECTION SPECIMEN BY BRUSH OR WASH;  Surgeon: Charlanne Kipper, MD;  [facility];  Service: Gastrointestinal    PR COLSC FLX W/RMVL OF TUMOR POLYP LESION SNARE TQ N/A 10/14/2016    Procedure: COLONOSCOPY FLEX; W/REMOV TUMOR/LES BY SNARE;  Surgeon: Eleanor Dewey Sorrel, MD;  Location: [facility]ED[facility] [place];  Service: Gastroenterology    PR COLSC FLX W/RMVL OF TUMOR POLYP LESION SNARE TQ N/A 01/15/2023    Procedure: COLONOSCOPY FLEX; W/REMOV TUMOR/LES BY SNARE;  Surgeon: Fae Alto, MD;  Location: [facility];  Service: Gastroenterology    PR GI IMAG INTRALUMINAL ESOPHAGUS-ILEUM W/I&R N/A 08/15/2023    Procedure: GI VIDEO TRACT IMAGE INTRALUMINAL (EG, CAPSULE ENDOSCOPY), ESOPHAGUS VIA ILEUM, PHYSICIAN INTERPRETATION & REPORT;  Surgeon: Charlanne Kipper, MD;  Location: [facility];  Service: Gastrointestinal    PR PERC CLOS,CONG INTERATRIAL COMMUN W/IMPL N/A 09/12/2022    Procedure: ASD closure;  Surgeon: Pia Sharper, MD;  Location: [facility] CATH;  Service: Cardiology    PR UPPER GI ENDOSCOPY,BIOPSY N/A 01/15/2023    Procedure: UGI ENDOSCOPY; WITH BIOPSY, SINGLE OR MULTIPLE;  Surgeon: Fae Alto, MD;  [facility]: [facility];  Service: Gastroenterology    PR UPPER GI ENDOSCOPY,DIAGNOSIS N/A 08/14/2023    Procedure: UGI ENDO, INCLUDE ESOPHAGUS, STOMACH, & DUODENUM &/OR JEJUNUM; DX W/WO COLLECTION SPECIMN, BY BRUSH OR WASH;  Surgeon: Charlanne Kipper, MD;  [facility]: [facility];  Service: Gastrointestinal    SKIN BIOPSY     [3]   Current Facility-Administered Medications   Medication Dose Route Frequency Provider Last Rate Last Admin    aspirin  chewable tablet 324 mg  324 mg Oral Once Billy Hoy Cutting, MD        atorvastatin  (LIPITOR ) tablet 20 mg  20 mg Oral Daily Candace Aleck BROCKS, MD        bumetanide  (BUMEX ) injection 2 mg  2 mg Intravenous Once Sachse, Caroline C, MD        cholecalciferol  (vitamin D3 25 mcg (1,000 units)) tablet 50 mcg  50 mcg Oral Daily Sachse, Caroline C, MD        [Provider Hold] enoxaparin  (LOVENOX ) syringe 40 mg  40 mg Subcutaneous Q24H Candace Aleck BROCKS, MD        ferrous sulfate  tablet 325 mg  325 mg Oral Every other day Candace Aleck BROCKS, MD        lactulose  (CEPHULAC ) packet 40 g  40 g Oral TID Candace Aleck BROCKS, MD        loratadine  (CLARITIN ) tablet 10 mg  10 mg Oral Daily Sachse, Caroline C, MD        melatonin tablet 3 mg  3 mg Oral Nightly PRN Sachse, Caroline C, MD        naltrexone  (DEPADE) tablet 50 mg  50 mg Oral Daily Candace Aleck BROCKS, MD        ondansetron  (ZOFRAN -ODT) disintegrating tablet 8 mg  8 mg Oral Q8H PRN Sachse, Caroline C, MD        pantoprazole  (Protonix ) EC tablet 20 mg  20 mg Oral Daily before breakfast Candace Aleck BROCKS, MD  polyethylene glycol (MIRALAX) packet 17 g  17 g Oral Daily PRN Sachse, Caroline C, MD        rifAXIMin  (XIFAXAN ) tablet 550 mg  550 mg Oral BID Sachse, Caroline C, MD        senna NALANI) tablet 2 tablet  2 tablet Oral Nightly PRN Sachse, Caroline C, MD        sertraline  (ZOLOFT ) tablet 100 mg  100 mg Oral Daily Sachse, Caroline C, MD        spironolactone  (ALDACTONE ) tablet 200 mg  200 mg Oral Daily Candace Aleck BROCKS, MD         Current Outpatient Medications   Medication Sig Dispense Refill    atorvastatin  (LIPITOR ) 20 MG tablet TAKE ONE TABLET BY MOUTH ONCE DAILY 90 tablet 1    blood sugar diagnostic (GLUCOSE BLOOD) Strp Use to test blood sugar daily 100 strip 3    blood-glucose meter kit Use to test blood sugar DAILY 1 each 1    bumetanide  (BUMEX ) 1 MG tablet TAKE 2 TABLETS BY MOUTH ONCE DAILY 60 tablet 11    cholecalciferol , vitamin D3 25 mcg, 1,000 units,, 1,000 unit (25 mcg) tablet Take 2 tablets (50 mcg total) by mouth daily.      cholecalciferol , vitamin D3-25 mcg, 1,000 unit,, 25 mcg (1,000 unit) capsule Take 2 capsules (50 mcg total) by mouth daily.      DAILY-VITE, WITH FOLIC ACID, 400 mcg Tab tablet Take 1 tablet by mouth daily.      ferrous sulfate  325 (65 FE) MG tablet Take 1 tablet (325 mg total) by mouth every other day. 45 tablet 1    inhalational spacing device (AEROCHAMBER MV) Spcr Use as directed with inhalers 1 each 0    lactulose  (CEPHULAC ) 20 gram packet Mix and take 2 packets (40 g total) by mouth Three (3) times a day. (Patient not taking: Reported on 07/05/2024) 540 packet 3    lancets Misc Use to test blood sugar daily 100 each 3    loratadine  (CLARITIN ) 10 mg tablet TAKE ONE TABLET BY MOUTH ONCE DAILY 90 tablet 1    multivitamin (TAB-A-VITE/THERAGRAN) per tablet Take 1 tablet by mouth daily. 90 tablet 3    naltrexone  (DEPADE) 50 mg tablet TAKE ONE TABLET BY MOUTH ONCE DAILY 30 tablet 11    omeprazole  (PRILOSEC) 20 MG capsule TAKE 1 CAPSULE BY MOUTH TWICE DAILY 30MINS BEFORE MEALS 180 capsule 1    ondansetron  (ZOFRAN ) 8 MG tablet Take 1 tablet (8 mg total) by mouth every eight (8) hours as needed for nausea. 30 tablet 2    OZEMPIC  2 mg/dose (8 mg/3 mL) PnIj INJECT 2MG  under the skin once weekly (every 7 days) 9 mL 2    rifAXIMin  (XIFAXAN ) 550 mg Tab Take 1 tablet (550 mg total) by mouth two (2) times a day. 180 tablet 3    sertraline  (ZOLOFT ) 100 MG tablet Take 1 tablet (100 mg total) by mouth daily. 90 tablet 3    spironolactone  (ALDACTONE ) 100 MG tablet TAKE 2 TABLETS BY MOUTH ONCE DAILY 30 tablet 11   [4]   Allergies  Allergen Reactions    Shellfish Containing Products Anaphylaxis    Glipizide  Headache    Lisinopril  Cough   [5]   Family History  Problem Relation Age of Onset    Heart disease Mother     Hypertension Mother     COPD Mother     Heart disease Father     Kidney disease Father  Allergies Father     Gout Father     Diabetes Father     Allergies Other         FAMILY H/O    Coronary artery disease Other         FAMILY H/O    Kidney failure Other         FAMILY H/O    No Known Problems Sister     No Known Problems Daughter     No Known Problems Maternal Grandmother     No Known Problems Maternal Grandfather     No Known Problems Paternal Grandmother     No Known Problems Paternal Grandfather     Alcohol abuse Maternal Uncle     Birth defects Son     Birth defects Son     BRCA 1/2 Neg Hx     Breast cancer Neg Hx     Cancer Neg Hx     Colon cancer Neg Hx     Endometrial cancer Neg Hx     Ovarian cancer Neg Hx     Substance Abuse Disorder Neg Hx     Mental illness Neg Hx    [6]   Social History  Tobacco Use    Smoking status: Never     Passive exposure: Past    Smokeless tobacco: Never   Vaping Use    Vaping status: Never Used   Substance Use Topics    Alcohol use: No    Drug use: No

## 2024-07-21 NOTE — Telephone Encounter (Signed)
 Two calls placed to patient to discuss palliative care referral.  Did not leave reason for call on VM but asked patient to return my call.

## 2024-07-21 NOTE — ED Notes (Signed)
"  Bed: 02  Expected date:   Expected time:   Means of arrival: Car  Comments:  "

## 2024-07-22 LAB — BASIC METABOLIC PANEL
ANION GAP: 17 mmol/L — ABNORMAL HIGH (ref 5–14)
BLOOD UREA NITROGEN: 18 mg/dL (ref 9–23)
BUN / CREAT RATIO: 13
CALCIUM: 8.8 mg/dL (ref 8.7–10.4)
CHLORIDE: 100 mmol/L (ref 98–107)
CO2: 30.1 mmol/L (ref 20.0–31.0)
CREATININE: 1.37 mg/dL — ABNORMAL HIGH (ref 0.55–1.02)
EGFR CKD-EPI (2021) FEMALE: 45 mL/min/1.73m2 — ABNORMAL LOW (ref >=60)
GLUCOSE RANDOM: 170 mg/dL (ref 70–179)
POTASSIUM: 3.6 mmol/L (ref 3.4–4.8)
SODIUM: 147 mmol/L — ABNORMAL HIGH (ref 135–145)

## 2024-07-22 LAB — CBC
HEMATOCRIT: 19.9 % — ABNORMAL LOW (ref 34.0–44.0)
HEMATOCRIT: 21.1 % — ABNORMAL LOW (ref 34.0–44.0)
HEMOGLOBIN: 6.3 g/dL — ABNORMAL LOW (ref 11.3–14.9)
HEMOGLOBIN: 6.7 g/dL — ABNORMAL LOW (ref 11.3–14.9)
MEAN CORPUSCULAR HEMOGLOBIN CONC: 31.9 g/dL — ABNORMAL LOW (ref 32.0–36.0)
MEAN CORPUSCULAR HEMOGLOBIN CONC: 31.8 g/dL — ABNORMAL LOW (ref 32.0–36.0)
MEAN CORPUSCULAR HEMOGLOBIN: 24.7 pg — ABNORMAL LOW (ref 25.9–32.4)
MEAN CORPUSCULAR HEMOGLOBIN: 24.4 pg — ABNORMAL LOW (ref 25.9–32.4)
MEAN CORPUSCULAR VOLUME: 77.6 fL (ref 77.6–95.7)
MEAN CORPUSCULAR VOLUME: 76.7 fL — ABNORMAL LOW (ref 77.6–95.7)
MEAN PLATELET VOLUME: 10.8 fL — ABNORMAL HIGH (ref 6.8–10.7)
MEAN PLATELET VOLUME: 10.8 fL — ABNORMAL HIGH (ref 6.8–10.7)
PLATELET COUNT: 61 10*9/L — ABNORMAL LOW (ref 150–450)
PLATELET COUNT: 73 10*9/L — ABNORMAL LOW (ref 150–450)
RED BLOOD CELL COUNT: 2.57 10*12/L — ABNORMAL LOW (ref 3.95–5.13)
RED BLOOD CELL COUNT: 2.75 10*12/L — ABNORMAL LOW (ref 3.95–5.13)
RED CELL DISTRIBUTION WIDTH: 24.2 % — ABNORMAL HIGH (ref 12.2–15.2)
RED CELL DISTRIBUTION WIDTH: 24.3 % — ABNORMAL HIGH (ref 12.2–15.2)
WBC ADJUSTED: 6.1 10*9/L (ref 3.6–11.2)
WBC ADJUSTED: 7.1 10*9/L (ref 3.6–11.2)

## 2024-07-22 LAB — PROTIME-INR
INR: 1.44
PROTIME: 16.3 s — ABNORMAL HIGH (ref 9.9–12.6)

## 2024-07-22 LAB — COMPREHENSIVE METABOLIC PANEL
ALBUMIN: 2.2 g/dL — ABNORMAL LOW (ref 3.4–5.0)
ALKALINE PHOSPHATASE: 101 U/L (ref 46–116)
ALT (SGPT): 43 U/L (ref 10–49)
ANION GAP: 15 mmol/L — ABNORMAL HIGH (ref 5–14)
AST (SGOT): 68 U/L — ABNORMAL HIGH (ref <=34)
BILIRUBIN TOTAL: 1.6 mg/dL — ABNORMAL HIGH (ref 0.3–1.2)
BLOOD UREA NITROGEN: 17 mg/dL (ref 9–23)
BUN / CREAT RATIO: 14
CALCIUM: 8.5 mg/dL — ABNORMAL LOW (ref 8.7–10.4)
CHLORIDE: 98 mmol/L (ref 98–107)
CO2: 28.9 mmol/L (ref 20.0–31.0)
CREATININE: 1.22 mg/dL — ABNORMAL HIGH (ref 0.55–1.02)
EGFR CKD-EPI (2021) FEMALE: 52 mL/min/1.73m2 — ABNORMAL LOW (ref >=60)
GLUCOSE RANDOM: 100 mg/dL (ref 70–179)
POTASSIUM: 3 mmol/L — ABNORMAL LOW (ref 3.4–4.8)
PROTEIN TOTAL: 5.5 g/dL — ABNORMAL LOW (ref 5.7–8.2)
SODIUM: 142 mmol/L (ref 135–145)

## 2024-07-22 LAB — HEMOGLOBIN: HEMOGLOBIN: 8.8 g/dL — ABNORMAL LOW (ref 11.3–14.9)

## 2024-07-22 LAB — MAGNESIUM: MAGNESIUM: 1.6 mg/dL (ref 1.6–2.6)

## 2024-07-22 MED ADMIN — sertraline (ZOLOFT) tablet 100 mg: 100 mg | ORAL | @ 14:00:00

## 2024-07-22 MED ADMIN — rifAXIMin (XIFAXAN) tablet 550 mg: 550 mg | ORAL | @ 07:00:00 | Stop: 2025-07-21

## 2024-07-22 MED ADMIN — rifAXIMin (XIFAXAN) tablet 550 mg: 550 mg | ORAL | @ 14:00:00 | Stop: 2025-07-21

## 2024-07-22 MED ADMIN — spironolactone (ALDACTONE) tablet 200 mg: 200 mg | ORAL | @ 14:00:00

## 2024-07-22 MED ADMIN — naltrexone (DEPADE) tablet 50 mg: 50 mg | ORAL | @ 14:00:00

## 2024-07-22 MED ADMIN — pantoprazole (Protonix) EC tablet 20 mg: 20 mg | ORAL | @ 12:00:00

## 2024-07-22 MED ADMIN — cholecalciferol (vitamin D3 25 mcg (1,000 units)) tablet 50 mcg: 50 ug | ORAL | @ 14:00:00

## 2024-07-22 MED ADMIN — melatonin tablet 3 mg: 3 mg | ORAL | @ 07:00:00

## 2024-07-22 MED ADMIN — potassium chloride ER tablet 40 mEq: 40 meq | ORAL | @ 18:00:00 | Stop: 2024-07-22

## 2024-07-22 MED ADMIN — sodium chloride (NS) 0.9 % infusion: 100 mL/h | INTRAVENOUS | @ 22:00:00 | Stop: 2024-07-22

## 2024-07-22 MED ADMIN — lactulose (CEPHULAC) packet 40 g: 40 g | ORAL | @ 21:00:00

## 2024-07-22 MED ADMIN — lactulose (CEPHULAC) packet 40 g: 40 g | ORAL | @ 15:00:00

## 2024-07-22 MED ADMIN — ferrous sulfate tablet 325 mg: 325 mg | ORAL | @ 14:00:00

## 2024-07-22 MED ADMIN — loratadine (CLARITIN) tablet 10 mg: 10 mg | ORAL | @ 14:00:00 | Stop: 2024-07-22

## 2024-07-22 MED ADMIN — atorvastatin (LIPITOR) tablet 20 mg: 20 mg | ORAL | @ 14:00:00

## 2024-07-22 MED ADMIN — bumetanide (BUMEX) injection 2 mg: 2 mg | INTRAVENOUS | @ 06:00:00 | Stop: 2024-07-22

## 2024-07-22 MED ADMIN — bumetanide (BUMEX) injection 2 mg: 2 mg | INTRAVENOUS | @ 18:00:00 | Stop: 2024-07-22

## 2024-07-22 MED ADMIN — bumetanide (BUMEX) injection 2 mg: 2 mg | INTRAVENOUS | @ 14:00:00 | Stop: 2024-07-22

## 2024-07-22 NOTE — Progress Notes (Signed)
 Family Medicine Inpatient Progress Note    Assessment & Plan:   Denise York is a 59 y.o. female whose presentation is complicated by MASH cirrhosis, portal hypertension, T2DM, CHF, HLD, and breast cancer that presented to [facility] Emergency Department with chest discomfort and shortness of breath, found to have a Rt pleural effusion.     Principal Problem:    Pleural effusion, right  Active Problems:    Type 2 diabetes mellitus without complications (CMS-HCC)    Decompensated cirrhosis    (CMS-HCC)    Portal hypertension    (CMS-HCC)    Metabolic dysfunction-associated steatohepatitis (MASH)    Anemia    Active Problems    ##SOB - R pleural effusion  Patient presents with 2 day hx of chest discomfort and shortness of breath. Decided to come to ED as she was unable to fall asleep due to severity of SOB. Oxygen sats have ranged from high 80s to low 90s on 2 L [place] while in the emergency room. ED workup notable for large R loculated pleural effusion. Negative troponins and normal EKG. ProBNP elevated to 522 though decreased from most recent level of 972. Physical exam revealed decreased breath sounds over R side. Echo obtained revealed EF at 65-70%, with mildly increased LV wall thickness and mild mitral valve regurgitation. Given this, pleural effusion less likely CHF related, and likely secondary to decompensated cirrhosis vs. malignant effusion 2/2 breast cancer.   - Start IV Bumex  4mg   - Lung POCUS following diuresis   - Consider thoracentesis with pleural fluid analysis if inadequate response to IV diuresis  - Supplemental O2 as needed; continue to down-titrate as able  - Strict I/Os  - Daily CBC & CMP   - PT/OT consulted  - Outpatient Palliative referral     ##Anemia I Thrombocytopenia  Patient was hospitalized from December 17 to 19 for recurrent falls and anemia. Patient was found to be anemic at 5.8 requiring 2 units PRBC. Recent Iron  panel in 12/'25 evident for Ferritin 9.2, Iron  58, TIC 297.4. Etiology of this anemia was suspected to be due to iron  deficiency at the time given no concern for GI bleed or other hemorrhagic sources. Patient ultimately discharged with oral iron  supplementation. At time of admission, Hgb 7.7 and platelets 101 without signs of active bleed. Patient received 1 unit pRBC transfusion on 1/15, which increased Hgb to 8.8, but has downtrended to 8.0 this AM. Platelets remain low at 46, with no petechiae or purpura.   - Repeat CBC at 1500  - Transfuse 1 unit pRBCs if hemoglobin <7  - Consider platelet transfusion if platelets continue to drop   - GI consult if hemoglobin continues to drop  - Continue ferrous sulfate  325mg  every other day  - Daily CBC    ##MASH cirrhosis - Portal Hypertension  Patient followed up with [facility] liver transplant team who noted no additional interval events or abnormalities. Patient is currently approved for liver transplantation but does not have a living donor option. Patient currently taking lactulose  and rifaximin  as well as budesonide  and spironolactone .   - Continue home lactulose , rifaximin , spironolactone   - Daily PT/INR    Chronic Problems     ##T2DM  - Monitor daily CMP     ##GERD  - Continue Pantoprazole  20 mg daily      ##Anxiety - MDD  - Continue Sertraline  100 mg daily     The patient's presentation is complicated by the following clinically significant conditions requiring additional evaluation  and treatment: - Thrombocytopenia requiring further investigation or monitor  - Chronic kidney disease POA requiring further investigation, treatment, or monitoring   - Chronic heart failure POA requiring further investigation, treatment, or monitoring  - Hypoxia requiring further investigation, treatment, or monitoring  - Anemia requiring at least daily CBC for further monitoring      Daily Checklist:  Diet: Regular Diet  DVT PPx: Lovenox  40mg  q24h (Hold iso anemia + thrombocytopenia)  Code Status: Full Code  Dispo: Patient appropriate for Inpatient based on expectation of ongoing need for hospitalization greater than two midnights based on severity of presentation/services including supplemental oxygen and IV diuresis.     Team Contact Information:   Primary Team: Family [facility]  Primary Resident: Ozell FORBES Bussing, MD  Resident's Pager: [facility] [phone])    Interval History:   Yesterday, patient found to have Hgb of 6.7 in AM. She received 1 unit pRBC, which increased Hgb to 8.8. This morning, patient felt better. Breathing is improving on 2L [place]. No concerns or complaints at this time.     Objective:   Temp:  [36.7 ??C (98.1 ??F)-36.9 ??C (98.4 ??F)] 36.8 ??C (98.2 ??F)  Pulse:  [83-96] 83  SpO2 Pulse:  [90-95] 90  Resp:  [18-20] 18  BP: (113-141)/(51-86) 113/56  SpO2:  [95 %-100 %] 100 %    Gen: Ill appearing female lying in hospital bed on2L [place]; converses appropriately  Eyes: Sclera anicteric, EOMI grossly normal   HENT: Atraumatic, normocephalic  Neck: Trachea midline  Heart: Regular rate and rhythm with systolic murmur best heard over LUSB & RUSB  Lungs: L lung fields clear throughout; diminished breath sounds over R lung fields  Abdomen: Mild generalized abdominal discomfort   Extremities: 2+ lower extremity edema bilaterally  Neuro: Grossly symmetric, non-focal  Skin:  No rashes, lesions on clothed exam  Psych: Alert, oriented

## 2024-07-22 NOTE — Progress Notes (Signed)
 Family Medicine Inpatient Progress Note    Assessment & Plan:   Denise York is a 59 y.o. female whose presentation is complicated by MASH cirrhosis, portal hypertension, T2DM, CHF, HLD, and breast cancer that presented to [facility] Emergency Department with chest discomfort and shortness of breath, found to have a Rt pleural effusion.     Principal Problem:    Pleural effusion, right  Active Problems:    Type 2 diabetes mellitus without complications (CMS-HCC)    Decompensated cirrhosis    (CMS-HCC)    Portal hypertension    (CMS-HCC)    Metabolic dysfunction-associated steatohepatitis (MASH)    Anemia        Active Problems    ##SOB - R pleural effusion  Patient presents with 2 day hx of chest discomfort and shortness of breath. Decided to come to ED as she was unable to fall asleep due to severity of SOB. Oxygen sats have ranged from high 80s to low 90s on 2 L [place] while in the emergency room. ED workup notable for large R loculated pleural effusion. Negative troponins and normal EKG. ProBNP elevated to 522 though decreased from most recent level of 972. Physical exam revealed decreased breath sounds over R side. Currently unclear whether effusion is the result of decompensated cirrhosis vs CHF. Also, patient has hx of breast cancer which may be an etiology of her pleural effusion.   - s/p IV Bumex  2 mg overnight due for another dose at 0900  - Order additional dose of IV Bumex  2mg  today  - Increase dose of IV Bumex  to 4mg  07/23/24  - Follow up Echo  - Consider thoracentesis & pleural fluid analysis if inadequate response to IV diuresis  - Supplemental O2 as needed; continue to down-titrate as able  - Strict I/Os  - Daily CBC & CMP   - PT/OT consulted  - Consider outpatient palliative referral     ##MASH cirrhosis - Portal Hypertension  Patient followed up with [facility] liver transplant team who noted no additional interval events or abnormalities. Patient is currently approved for liver transplantation but does not have a living donor option. Patient currently taking lactulose  and rifaximin  as well as budesonide  and spironolactone .   - Continue home lactulose , rifaximin , spironolactone   - Daily PT/INR     ##Anemia  Patient was hospitalized from December 17 to 19 for recurrent falls and anemia. Patient was found to be anemic at 5.8 requiring 2 units PRBC. Recent Iron  panel in 12/'25 evident for Ferritin 9.2, Iron  58, TIC 297.4. Etiology of this anemia was suspected to be due to iron  deficiency at the time given no concern for GI bleed or other hemorrhagic sources. Patient ultimately discharged with oral iron  supplementation. At time of admission, hgb 7.7 without signs of active bleed.  - Recheck CBC this AM, if <7, consent for blood transfusion and give 1 unit pRBCs  - Start ferrous sulfate  325mg  every other day  - Consider GI consult if hemoglobin continues to downtrend   - Daily CBC    Chronic Problems     ##T2DM  - Monitor daily CMP  - Consider SSI if BG is uncontrolled while inpatient     ##GERD  - Continue pantoprazole  20 mg daily      ##Anxiety - MDD  - Continue sertraline  100 mg daily     The patient's presentation is complicated by the following clinically significant conditions requiring additional evaluation and treatment: - Thrombocytopenia requiring further investigation or monitor  -  Chronic kidney disease POA requiring further investigation, treatment, or monitoring   - Chronic heart failure POA requiring further investigation, treatment, or monitoring  - Hypoxia requiring further investigation, treatment, or monitoring  - Anemia requiring at least daily CBC for further monitoring      Daily Checklist:  Diet: Regular Diet  DVT PPx: Lovenox  40mg  q24h (hold iso potential thoracentesis)  Code Status: Full Code  Dispo: Patient appropriate for Inpatient based on expectation of ongoing need for hospitalization greater than two midnights based on severity of presentation/services including supplemental oxygen and IV diuresis.     Team Contact Information:   Primary Team: Family Medicine Blue  Primary Resident: Ozell FORBES Bussing, MD  Resident's Pager: [facility] [phone])    Interval History:   Overnight, the patient received IV Bumex  2mg . She reports feeling a bit better this AM on 1.5L [place].     Objective:   Temp:  [36.7 ??C (98 ??F)-36.9 ??C (98.4 ??F)] 36.8 ??C (98.3 ??F)  Pulse:  [89-99] 89  SpO2 Pulse:  [90-114] 90  Resp:  [18-20] 20  BP: (113-145)/(54-90) 117/60  SpO2:  [93 %-100 %] 98 %    Gen: Ill appearing female lying in hospital bed intermittently using supplemental oxygen via [place]; converses appropriately  Eyes: Sclera anicteric, EOMI grossly normal   HENT: Atraumatic, normocephalic  Neck: Trachea midline  Heart: Regular rate and rhythm with systolic murmur best heard over LUSB & RUSB  Lungs: L lung fields clear throughout; diminished breath sounds over R lung fields  Abdomen: Soft with mild spot tenderness throughout  Extremities: 2+ lower extremity edema bilaterally  Neuro: Grossly symmetric, non-focal  Skin:  No rashes, lesions on clothed exam  Psych: Alert, oriented

## 2024-07-22 NOTE — Consults (Signed)
 Care Management  Initial Transition Planning Assessment              General  Care Manager / Social Worker assessed the patient by : In person interview with patient  Orientation Level: Oriented X4  Functional level prior to admission: Independent  Reason for referral: Discharge Planning, Home Health    Pt see in ED prior to admission. At baseline she lives at home with her spouse and step-daughter. She is independent with ADLs, She uses a rollator that she borrowed, and BSC. She has been receiving home health through [facility] initiated during her last admission.     Contact/Decision Maker  Extended Emergency Contact Information  Primary Emergency Contact: Calderon,Debbie  Mobile Phone: [phone]  Relation: Sister  Secondary Emergency Contact: Vecchione,Walter   [place] [employer] Phone: [phone]  Relation: Spouse    Legal Next of Kin / Guardian / POA / Advance Directives       Readmission Information    Have you been hospitalized in the last 30 days?: Yes  Name of Hospital: [facility]  Were you being cared for at a skilled nursing facility:: No     What day were you discharged from that hospital or facility?: 06/25/24  Number of Days between previous discharge and readmission date: 15-30 days    Type of Readmission: Unplanned readmission due to new medical issue/unrelated to previous admission    Readmission Source: Home       Did the following happen with your discharge?    Did you receive a follow-up/transition call?: Yes     Did you get your discharge medications?  : Yes       Did you go to your scheduled follow up appointment with your doctor on discharge?: Yes       Which services or equipment arranged after your discharge arrived?: Home Health    Did you understand your discharge instructions?: Yes       Contributing Factors for Readmission: Complex medical history    Did you feel prepared for discharge?: Yes        Patient Information  Lives with: Spouse/significant other    Type of Residence: Private residence        Location/Detail: [place]    Support Systems/Concerns: Children, Spouse         Home Care services in place prior to admission?: No                  Equipment Currently Used at Home: commode chair, other (see comments), walker, standard  Current HME Agency (Name/Phone #): Home Health - [facility] - PT    Currently receiving outpatient dialysis?: No       Financial Information       Need for financial assistance?: No       Social Drivers of Health  Social Drivers of Health were addressed in provider documentation.  Please refer to patient history.  Social Drivers of Health     Food Insecurity: No Food Insecurity (06/24/2024)    Hunger Vital Sign     Worried About Running Out of Food in the Last Year: Never true     Ran Out of Food in the Last Year: Never true   Tobacco Use: Low Risk (07/21/2024)    Patient History     Smoking Tobacco Use: Never     Smokeless Tobacco Use: Never     Passive Exposure: Past   Transportation Needs: No Transportation Needs (  06/24/2024)    PRAPARE - Therapist, Art (Medical): No     Lack of Transportation (Non-Medical): No   Alcohol Use: Not At Risk (08/14/2023)    Alcohol Use     How often do you have a drink containing alcohol?: Never     How many drinks containing alcohol do you have on a typical day when you are drinking?: 1 - 2     How often do you have 5 or more drinks on one occasion?: Never   Housing: Low Risk (06/24/2024)    Housing     Within the past 12 months, have you ever stayed: outside, in a car, in a tent, in an overnight shelter, or temporarily in someone else's home (i.e. couch-surfing)?: No     Are you worried about losing your housing?: No   Physical Activity: Not on file   Utilities: Low Risk (06/24/2024)    Utilities     Within the past 12 months, have you been unable to get utilities (heat, electricity) when it was really needed?: No   Stress: Not on file   Interpersonal Safety: Patient Unable To Answer (07/13/2024)    Interpersonal Safety     Unsafe Where You Currently Live: Patient unable to answer     Physically Hurt by Anyone: Patient unable to answer     Abused by Anyone: Patient unable to answer   Substance Use: Not on file (05/18/2023)   Intimate Partner Violence: Not on file   Social Connections: Not on file   Financial Resource Strain: Low Risk (06/24/2024)    Overall Financial Resource Strain (CARDIA)     Difficulty of Paying Living Expenses: Not very hard   Health Literacy: Not on file   Internet Connectivity: Not on file       Complex Discharge Information    Is patient identified as a difficult/complex discharge?: No       Interventions:       Discharge Needs Assessment  Concerns to be Addressed: discharge planning    Clinical Risk Factors:           Equipment Needed After Discharge: other (see comments) (TBD)    Discharge Facility/Level of Care Needs:      Readmission  Risk of Unplanned Readmission Score: UNPLANNED READMISSION SCORE: 19.05%  Predictive Model Details          19% (Medium)  Factor Value    Calculated 07/22/2024 12:04 17% Number of active inpatient medication orders 25    [facility] Risk of Unplanned Readmission Model 11% Number of ED visits in last six months 2     9% ECG/EKG order present in last 6 months     9% Latest calcium low (8.5 mg/dL)     8% Charlson Comorbidity Index 7     8% Diagnosis of electrolyte disorder present     7% Imaging order present in last 6 months     6% Latest hemoglobin low (6.3 g/dL)     5% Number of hospitalizations in last year 1     5% Age 60     5% Diagnosis of deficiency anemia present     4% Active anticoagulant inpatient medication order present     4% Latest creatinine high (1.22 mg/dL)     2% Future appointment scheduled     1% Active ulcer inpatient medication order present     0% Current length of stay 0.466 days      Readmitted  Within the Last 30 Days? (No if blank)   Patient at risk for readmission?: No    Discharge Plan       Expected Discharge Date:     Expected Transfer from Critical Care:      Quality data for continuing care services shared with patient and/or representative?: Yes  Patient and/or family were provided with choice of facilities / services that are available and appropriate to meet post hospital care needs?: Yes   List choices in order highest to lowest preferred, if applicable. : No preference    Initial Assessment complete?: Yes

## 2024-07-22 NOTE — H&P (Signed)
 Family Medicine Inpatient Service History & Physical    Assessment & Plan:   Denise York is a 59 y.o. female whose presentation is complicated by MASH cirrhosis, portal hypertension, T2DM, CHF, and HLD  that presented to [facility] Emergency Department with chest discomfort and shortness of breath.     Principal Problem:    Pleural effusion, right  Active Problems:    Type 2 diabetes mellitus without complications (CMS-HCC)    Decompensated cirrhosis    (CMS-HCC)    Portal hypertension    (CMS-HCC)    Metabolic dysfunction-associated steatohepatitis (MASH)    Anemia      Active Problems    ##SOB - R pleural effusion  Patient presents with 2 day hx of chest discomfort and shortness of breath. Decided to come to ED as she was unable to fall asleep due to severity of SOB. Oxygen sats have ranged from high 80s to low 90s on 2 L [place] while in the emergency room. ED workup notable for large R loculated pleural effusion. Negative troponins and normal EKG. ProBNP elevated to 522 though decreased from most recent level of 972. Physical exam revealed decreased breath sounds over R side. Currently unclear whether effusion is the result of decompensated cirrhosis vs CHF. Also, patient has hx of breast cancer which may be an etiology of her pleural effusion.   - IV Bumex  2 mg 1/15 overnight, 2 mg 0900   [ ]  Continue to spot dose as needed   - Strict I/Os  - Consider thoracentesis & pleural fluid analysis if inadequate response to IV diuresis  - Supplemental O2 as needed; continue to down-titrate as able  - Daily CBC & CMP   - PT/OT consulted  [ ]  Consider palliative consult    ##MASH cirrhosis - Portal Hypertension  Patient followed up with [facility] liver transplant team who noted no additional interval events or abnormalities. Patient is currently approved for liver transplantation but does not have a living donor option. Patient currently taking lactulose  and rifaximin  as well as budesonide  and spironolactone .   - Continue home lactulose , rifaximin , spironolactone     ##Anemia  Patient was hospitalized from December 17 to 19 for recurrent falls and anemia. Patient was found to be anemic at 5.8 requiring 2 units PRBC. Etiology of this anemia was suspected to be due to iron  deficiency at the time given no concern for GI bleed or other hemorrhagic sources. Patient ultimately discharged with oral iron  supplementation. At time of admission, hgb 7.7 without signs of active bleed.  - Daily CBC to trend H&H  - Ferrous sulfate  325 mg daily   - Consider iron  infusion while admitted    Chronic Problems    ##T2DM  - Monitor daily CMP  - Consider SSI if BG is uncontrolled while inpatient    ##GERD  - Continue pantoprazole  20 mg daily     ##Anxiety - MDD  - Continue sertraline  100 mg daily     The patient's presentation is complicated by the following clinically significant conditions requiring additional evaluation and treatment: - Thrombocytopenia requiring further investigation or monitor  - Chronic kidney disease POA requiring further investigation, treatment, or monitoring   - Chronic heart failure POA requiring further investigation, treatment, or monitoring  - Hypoxia requiring further investigation, treatment, or monitoring  - Anemia requiring at least daily CBC for further monitoring     Checklist:  Diet: Regular Diet  DVT PPx: Lovenox  40mg  q24h (HOLD in setting of potential thoracentesis)  Code Status: Full Code  Dispo: Patient appropriate for Inpatient based on expectation of ongoing need for hospitalization greater than two midnights based on severity of presentation/services including supplemental oxygen and IV diuresis.    Team Contact Information:   Primary Team: Family Medicine [facility]  Primary Resident: Donnice Ar  Resident's Pager: [facility] [phone])    Chief Concern:   Pleural effusion, right    Subjective:   Denise York is a 59 y.o. female with pertinent PMHx of MASH cirrhosis, portal hypertension, T2DM, CHF, and HLD presenting with chest discomfort and shortness of breath.       History obtained from patient.       HPI:  Patient reports for the past 1 to 2 days she has had significant chest discomfort as well as shortness of breath and dry cough. As she was lying down to go to bed this evening, she felt as if she could not catch her breath. Per patient report, her husband thought she sounded wheezy which prompted her to come to the ED for further evaluation.    Of note, patient was hospitalized from December 17 to 19 for recurrent falls and anemia. Patient was found to be anemic at 5.8 requiring 2 units PRBC. Etiology of this anemia was suspected to be due to iron  deficiency at the time given no concern for GI bleed or other hemorrhagic sources. Patient ultimately discharged with oral iron  supplementation. More recently, patient followed up with [facility] liver transplant team who noted no additional interval events or abnormalities. Patient is currently approved for liver transplantation but does not have a living donor option. Patient currently taking lactulose  and rifaximin  as well as budesonide  and spironolactone .     She denies fevers, chills, night sweats, palpitations,nausea, vomiting, dysuria, hematuria, melena, hematochezia, and diarrhea.     Pertinent Surgical Hx  Past Surgical History[1]      Pertinent Family Hx  Family History[2]      Pertinent Social Hx   Social History[3]     Allergies  Shellfish containing products, Glipizide , and Lisinopril     I reviewed the Medication List. The current list is Accurate  Prior to Admission medications   Medication Dose, Route, Frequency   atorvastatin  (LIPITOR ) 20 MG tablet 20 mg, Oral, Daily (standard)   blood sugar diagnostic (GLUCOSE BLOOD) Strp Use to test blood sugar daily   blood-glucose meter kit Use to test blood sugar DAILY   bumetanide  (BUMEX ) 1 MG tablet 2 mg, Oral, Daily (standard)   cholecalciferol, vitamin D3 25 mcg, 1,000 units,, 1,000 unit (25 mcg) tablet 50 mcg, Daily (standard)   cholecalciferol, vitamin D3-25 mcg, 1,000 unit,, 25 mcg (1,000 unit) capsule 2 capsules, Daily (standard)   DAILY-VITE, WITH FOLIC ACID, 400 mcg Tab tablet 1 tablet, Daily (standard)   ferrous sulfate  325 (65 FE) MG tablet 325 mg, Oral, Every other day   inhalational spacing device (AEROCHAMBER MV) Spcr Use as directed with inhalers   lactulose  (CEPHULAC ) 20 gram packet Mix and take 2 packets (40 g total) by mouth Three (3) times a day.  Patient not taking: Reported on 07/05/2024   lancets Misc Use to test blood sugar daily   loratadine  (CLARITIN ) 10 mg tablet 10 mg, Oral, Daily (standard)   multivitamin (TAB-A-VITE/THERAGRAN) per tablet 1 tablet, Oral, Daily (standard)   naltrexone  (DEPADE) 50 mg tablet 50 mg, Oral, Daily (standard)   omeprazole  (PRILOSEC) 20 MG capsule TAKE 1 CAPSULE BY MOUTH TWICE DAILY 30MINS BEFORE MEALS   ondansetron  (  ZOFRAN ) 8 MG tablet 8 mg, Oral, Every 8 hours PRN   OZEMPIC  2 mg/dose (8 mg/3 mL) PnIj INJECT 2MG  under the skin once weekly (every 7 days)   rifAXIMin  (XIFAXAN ) 550 mg Tab 550 mg, Oral, 2 times a day (standard)   sertraline  (ZOLOFT ) 100 MG tablet 100 mg, Oral, Daily (standard)   spironolactone  (ALDACTONE ) 100 MG tablet 200 mg, Oral, Daily (standard)       Designated Healthcare Decision Maker:  Ms. Perren currently has decisional capacity for healthcare decision-making and is able to designate a surrogate healthcare decision maker. Ms. Katona designated healthcare decision maker(s) is/are Debbie Calderon (the patient's adult sibling) as denoted by stated patient preference.    Objective:   Physical Exam:  Temp:  [36.6 ??C (97.9 ??F)-36.8 ??C (98.2 ??F)] 36.7 ??C (98.1 ??F)  Pulse:  [90-108] 94  Resp:  [20] 20  BP: (136-144)/(52-60) 138/60  SpO2:  [92 %-100 %] 96 %    Gen: Ill appearing female lying in hospital bed intermittently using supplemental oxygen via [place]; converses appropriately  Eyes: Sclera anicteric, EOMI grossly normal   HENT: Atraumatic, normocephalic  Neck: Trachea midline  Heart: Regular rate with systolic murmur best heard over LUSB & RUSB  Lungs: L lung fields clear throughout; diminished breath sounds over R lung fields  Abdomen: Soft with mild spot tenderness throughout  Extremities: 2+ lower extremity pitting edema bilaterally  Neuro: Grossly symmetric, non-focal; asterixis of upper extremities throughout interview  Skin:  No rashes, lesions on clothed exam  Psych: Alert, oriented    Donnice Ar, MS4      I attest that I have reviewed the student note and that the components of the history of the present illness, the physical exam, and the assessment and plan documented were performed by me or were performed in my presence by the student where I verified the documentation and performed (or re-performed) the exam and medical decision making.     Aleck JAYSON Escort, MD  [facility] Psychiatry PGY-1       [1]   Past Surgical History:  Procedure Laterality Date    ABDOMINAL WALL MESH  REMOVAL  1997, 2005    Placement, 1997, removal and replacement 2005    BLADDER SURGERY      bladder tact X2    BREAST BIOPSY Right 04/2013    benign    BREAST EXCISIONAL BIOPSY Right 05/2013    benign    BREAST SURGERY Right     precancerous spot removed    CHOLECYSTECTOMY      HYSTERECTOMY      IR EMBOLIZATION VENOUS OTHER THAN HEMORRHAGE  02/17/2023    IR EMBOLIZATION VENOUS OTHER THAN HEMORRHAGE 02/17/2023 Babara Lash, MD IMG VIR H&V [facility]    PR COLONOSCOPY FLX DX W/COLLJ SPEC WHEN PFRMD N/A 08/15/2023    Procedure: COLONOSCOPY, FLEXIBLE, PROXIMAL TO SPLENIC FLEXURE; DIAGNOSTIC, W/WO COLLECTION SPECIMEN BY BRUSH OR WASH;  Surgeon: Charlanne Kipper, MD;  Location: [facility] [place];  Service: Gastrointestinal    PR COLSC FLX W/RMVL OF TUMOR POLYP LESION SNARE TQ N/A 10/14/2016    Procedure: COLONOSCOPY FLEX; W/REMOV TUMOR/LES BY SNARE;  Surgeon: Eleanor Dewey Sorrel, MD;  Location: [facility] [place];  Service: Gastroenterology    PR COLSC FLX W/RMVL OF TUMOR POLYP LESION SNARE TQ N/A 01/15/2023    Procedure: COLONOSCOPY FLEX; W/REMOV TUMOR/LES BY SNARE;  Surgeon: Fae Alto, MD;  Location: [facility];  Service: Gastroenterology    PR GI IMAG  INTRALUMINAL ESOPHAGUS-ILEUM W/I&R N/A 08/15/2023    Procedure: GI VIDEO TRACT IMAGE INTRALUMINAL (EG, CAPSULE ENDOSCOPY), ESOPHAGUS VIA ILEUM, PHYSICIAN INTERPRETATION & REPORT;  Surgeon: Charlanne Kipper, MD;  Location: [facility];  Service: Gastrointestinal    PR PERC CLOS,CONG INTERATRIAL COMMUN W/IMPL N/A 09/12/2022    Procedure: ASD closure;  Surgeon: Pia Sharper, MD;  Location: [facility] CATH;  Service: Cardiology    PR UPPER GI ENDOSCOPY,BIOPSY N/A 01/15/2023    Procedure: UGI ENDOSCOPY; WITH BIOPSY, SINGLE OR MULTIPLE;  Surgeon: Fae Alto, MD;  Location: [facility];  Service: Gastroenterology    PR UPPER GI ENDOSCOPY,DIAGNOSIS N/A 08/14/2023    Procedure: UGI ENDO, INCLUDE ESOPHAGUS, STOMACH, & DUODENUM &/OR JEJUNUM; DX W/WO COLLECTION SPECIMN, BY BRUSH OR WASH;  Surgeon: Charlanne Kipper, MD;  Location: [facility];  Service: Gastrointestinal    SKIN BIOPSY     [2]   Family History  Problem Relation Age of Onset    Heart disease Mother     Hypertension Mother     COPD Mother     Heart disease Father     Kidney disease Father     Allergies Father     Gout Father     Diabetes Father     Allergies Other         FAMILY H/O    Coronary artery disease Other         FAMILY H/O    Kidney failure Other         FAMILY H/O    No Known Problems Sister     No Known Problems Daughter     No Known Problems Maternal Grandmother     No Known Problems Maternal Grandfather     No Known Problems Paternal Grandmother     No Known Problems Paternal Grandfather     Alcohol abuse Maternal Uncle     Birth defects Son     Birth defects Son     BRCA 1/2 Neg Hx     Breast cancer Neg Hx     Cancer Neg Hx     Colon cancer Neg Hx     Endometrial cancer Neg Hx     Ovarian cancer Neg Hx     Substance Abuse Disorder Neg Hx Mental illness Neg Hx    [3]   Social History  Socioeconomic History    Marital status: Married     Spouse name: None    Number of children: None    Years of education: None    Highest education level: None   Tobacco Use    Smoking status: Never     Passive exposure: Past    Smokeless tobacco: Never   Vaping Use    Vaping status: Never Used   Substance and Sexual Activity    Alcohol use: No    Drug use: No    Sexual activity: Not Currently     Birth control/protection: Abstinence, None   Other Topics Concern    Do you use sunscreen? Yes    Excessive sun exposure? Yes   Social History Narrative    2 births. No living children. Married new husband 2014.         02/21/2017        [facility] Components:        Family, social, cultural characteristics: social support includes best friend .      Patient has the following communication needs: none, per patient     Health  Literacy: How confident are you that you understand your health issues/concerns, can participate in your care, and manage your care along with your physician: confident.    Behaviors Affecting Health: none, per patient    Family history of mental health illness and/or substance abuse: asked patient/parent and none disclosed.    Have you been seen by any medical provider that we have not referred you to since your last visit ? No    Discussed a Living Will with the patient and uyz:Ejupzwu is under the age of 42.      Social Drivers of Health     Food Insecurity: No Food Insecurity (06/24/2024)    Hunger Vital Sign     Worried About Running Out of Food in the Last Year: Never true     Ran Out of Food in the Last Year: Never true   Tobacco Use: Low Risk (07/21/2024)    Patient History     Smoking Tobacco Use: Never     Smokeless Tobacco Use: Never     Passive Exposure: Past   Transportation Needs: No Transportation Needs (06/24/2024)    PRAPARE - Transportation     Lack of Transportation (Medical): No     Lack of Transportation (Non-Medical): No   Alcohol Use: Not At Risk (08/14/2023)    Alcohol Use     How often do you have a drink containing alcohol?: Never     How many drinks containing alcohol do you have on a typical day when you are drinking?: 1 - 2     How often do you have 5 or more drinks on one occasion?: Never   Housing: Low Risk (06/24/2024)    Housing     Within the past 12 months, have you ever stayed: outside, in a car, in a tent, in an overnight shelter, or temporarily in someone else's home (i.e. couch-surfing)?: No     Are you worried about losing your housing?: No   Utilities: Low Risk (06/24/2024)    Utilities     Within the past 12 months, have you been unable to get utilities (heat, electricity) when it was really needed?: No   Interpersonal Safety: Patient Unable To Answer (07/13/2024)    Interpersonal Safety     Unsafe Where You Currently Live: Patient unable to answer     Physically Hurt by Anyone: Patient unable to answer     Abused by Anyone: Patient unable to answer   Financial Resource Strain: Low Risk (06/24/2024)    Overall Financial Resource Strain (CARDIA)     Difficulty of Paying Living Expenses: Not very hard

## 2024-07-23 LAB — CBC
HEMATOCRIT: 24.3 % — ABNORMAL LOW (ref 34.0–44.0)
HEMATOCRIT: 26.6 % — ABNORMAL LOW (ref 34.0–44.0)
HEMOGLOBIN: 8 g/dL — ABNORMAL LOW (ref 11.3–14.9)
HEMOGLOBIN: 8.8 g/dL — ABNORMAL LOW (ref 11.3–14.9)
MEAN CORPUSCULAR HEMOGLOBIN CONC: 32.8 g/dL (ref 32.0–36.0)
MEAN CORPUSCULAR HEMOGLOBIN CONC: 33.2 g/dL (ref 32.0–36.0)
MEAN CORPUSCULAR HEMOGLOBIN: 25.8 pg — ABNORMAL LOW (ref 25.9–32.4)
MEAN CORPUSCULAR HEMOGLOBIN: 25.9 pg (ref 25.9–32.4)
MEAN CORPUSCULAR VOLUME: 78.6 fL (ref 77.6–95.7)
MEAN CORPUSCULAR VOLUME: 78.1 fL (ref 77.6–95.7)
MEAN PLATELET VOLUME: 9.4 fL (ref 6.8–10.7)
MEAN PLATELET VOLUME: 10.3 fL (ref 6.8–10.7)
PLATELET COUNT: 46 10*9/L — ABNORMAL LOW (ref 150–450)
PLATELET COUNT: 61 10*9/L — ABNORMAL LOW (ref 150–450)
RED BLOOD CELL COUNT: 3.09 10*12/L — ABNORMAL LOW (ref 3.95–5.13)
RED BLOOD CELL COUNT: 3.4 10*12/L — ABNORMAL LOW (ref 3.95–5.13)
RED CELL DISTRIBUTION WIDTH: 22.6 % — ABNORMAL HIGH (ref 12.2–15.2)
RED CELL DISTRIBUTION WIDTH: 23 % — ABNORMAL HIGH (ref 12.2–15.2)
WBC ADJUSTED: 6.8 10*9/L (ref 3.6–11.2)
WBC ADJUSTED: 9.1 10*9/L (ref 3.6–11.2)

## 2024-07-23 LAB — COMPREHENSIVE METABOLIC PANEL
ALBUMIN: 2.2 g/dL — ABNORMAL LOW (ref 3.4–5.0)
ALKALINE PHOSPHATASE: 111 U/L (ref 46–116)
ALT (SGPT): 42 U/L (ref 10–49)
ANION GAP: 13 mmol/L (ref 5–14)
AST (SGOT): 60 U/L — ABNORMAL HIGH (ref <=34)
BILIRUBIN TOTAL: 1.9 mg/dL — ABNORMAL HIGH (ref 0.3–1.2)
BLOOD UREA NITROGEN: 16 mg/dL (ref 9–23)
BUN / CREAT RATIO: 12
CALCIUM: 8.9 mg/dL (ref 8.7–10.4)
CHLORIDE: 103 mmol/L (ref 98–107)
CO2: 30.8 mmol/L (ref 20.0–31.0)
CREATININE: 1.36 mg/dL — ABNORMAL HIGH (ref 0.55–1.02)
EGFR CKD-EPI (2021) FEMALE: 45 mL/min/1.73m2 — ABNORMAL LOW (ref >=60)
GLUCOSE RANDOM: 119 mg/dL (ref 70–179)
POTASSIUM: 4.4 mmol/L (ref 3.4–4.8)
PROTEIN TOTAL: 5.6 g/dL — ABNORMAL LOW (ref 5.7–8.2)
SODIUM: 147 mmol/L — ABNORMAL HIGH (ref 135–145)

## 2024-07-23 LAB — MAGNESIUM: MAGNESIUM: 1.7 mg/dL (ref 1.6–2.6)

## 2024-07-23 LAB — PROTIME-INR
INR: 1.39
PROTIME: 15.7 s — ABNORMAL HIGH (ref 9.9–12.6)

## 2024-07-23 LAB — PHOSPHORUS: PHOSPHORUS: 3.1 mg/dL (ref 2.4–5.1)

## 2024-07-23 MED ADMIN — sertraline (ZOLOFT) tablet 100 mg: 100 mg | ORAL | @ 13:00:00

## 2024-07-23 MED ADMIN — rifAXIMin (XIFAXAN) tablet 550 mg: 550 mg | ORAL | @ 13:00:00 | Stop: 2025-07-21

## 2024-07-23 MED ADMIN — rifAXIMin (XIFAXAN) tablet 550 mg: 550 mg | ORAL | @ 01:00:00 | Stop: 2025-07-21

## 2024-07-23 MED ADMIN — spironolactone (ALDACTONE) tablet 200 mg: 200 mg | ORAL | @ 13:00:00

## 2024-07-23 MED ADMIN — naltrexone (DEPADE) tablet 50 mg: 50 mg | ORAL | @ 13:00:00

## 2024-07-23 MED ADMIN — pantoprazole (Protonix) EC tablet 20 mg: 20 mg | ORAL | @ 13:00:00

## 2024-07-23 MED ADMIN — cholecalciferol (vitamin D3 25 mcg (1,000 units)) tablet 50 mcg: 50 ug | ORAL | @ 13:00:00

## 2024-07-23 MED ADMIN — melatonin tablet 3 mg: 3 mg | ORAL | @ 01:00:00

## 2024-07-23 MED ADMIN — bumetanide (BUMEX) injection 4 mg: 4 mg | INTRAVENOUS | @ 13:00:00

## 2024-07-23 MED ADMIN — potassium chloride ER tablet 40 mEq: 40 meq | ORAL | @ 01:00:00 | Stop: 2024-07-22

## 2024-07-23 MED ADMIN — lactulose (CEPHULAC) packet 40 g: 40 g | ORAL | @ 19:00:00

## 2024-07-23 MED ADMIN — lactulose (CEPHULAC) packet 40 g: 40 g | ORAL | @ 13:00:00

## 2024-07-23 MED ADMIN — atorvastatin (LIPITOR) tablet 20 mg: 20 mg | ORAL | @ 13:00:00

## 2024-07-23 NOTE — Progress Notes (Signed)
 Opened in error

## 2024-07-23 NOTE — Plan of Care (Signed)
 Shift Summary  Bumetanide  was administered in the morning.   Pain remained at 0 and no intervention was required.   Safety interventions and fall reduction strategies were consistently maintained throughout the shift.   Multiple labs were drawn, with some abnormal results but no acute changes documented.   Overall, comfort and safety were maintained and no falls or injuries occurred during the shift.     Absence of Hospital-Acquired Illness or Injury: No new hospital-acquired injuries or illnesses documented during the shift; safety interventions and environment checks were consistently maintained. Lab results showed some abnormal values, but no new acute changes were noted over the course of the shift.     Optimal Comfort and Wellbeing: Pain remained at 0 throughout the shift, and no interventions were required for pain management.     Improved Ability to Complete Activities of Daily Living: Minimal assistance was needed for general hygiene, and oral care was performed independently at all time points.     Absence of Fall and Fall-Related Injury: Fall reduction program and hourly visual checks were maintained, with regular toileting and no falls or injuries reported.

## 2024-07-23 NOTE — Progress Notes (Signed)
 Family Medicine Inpatient Progress Note    Assessment & Plan:   Denise York is a 59 y.o. female whose presentation is complicated by MASH cirrhosis, portal hypertension, T2DM, CHF, HLD, and breast cancer that presented to [facility] Emergency Department with chest discomfort and shortness of breath, found to have a Rt pleural effusion.     Principal Problem:    Pleural effusion, right  Active Problems:    Type 2 diabetes mellitus without complications (CMS-HCC)    Decompensated cirrhosis    (CMS-HCC)    Portal hypertension    (CMS-HCC)    Metabolic dysfunction-associated steatohepatitis (MASH)    Anemia    Active Problems    ##SOB - R pleural effusion  Patient presents with 2 day hx of chest discomfort and shortness of breath. Decided to come to ED as she was unable to fall asleep due to severity of SOB. Oxygen sats have ranged from high 80s to low 90s on 2 L [place] while in the emergency room. ED workup notable for large R loculated pleural effusion. Negative troponins and normal EKG. ProBNP elevated to 522 though decreased from most recent level of 972. Physical exam revealed decreased breath sounds over R side. Echo obtained revealed EF at 65-70%, with mildly increased LV wall thickness and mild mitral valve regurgitation. Given this, pleural effusion less likely CHF related, and likely secondary to decompensated cirrhosis vs. malignant effusion 2/2 breast cancer.   - Continue IV Bumex  4mg   - Lung POCUS today  - Consider thoracentesis with pleural fluid analysis if inadequate response to IV diuresis  - Supplemental O2 as needed; continue to down-titrate as able  - Strict I/Os  - Daily CBC & CMP   - PT/OT consulted  - Outpatient Palliative referral     ##Anemia I Thrombocytopenia  Patient was hospitalized from December 17 to 19 for recurrent falls and anemia. Patient was found to be anemic at 5.8 requiring 2 units PRBC. Recent Iron  panel in 12/'25 evident for Ferritin 9.2, Iron  58, TIC 297.4. Etiology of this anemia was suspected to be due to iron  deficiency at the time given no concern for GI bleed or other hemorrhagic sources. Patient ultimately discharged with oral iron  supplementation. At time of admission, Hgb 7.7 and platelets 101 without signs of active bleed. Patient received 1 unit pRBC transfusion on 1/15, which increased Hgb to 8.8, but has downtrended to 8.2 this AM. Platelets remain low at 53, with no petechiae or purpura.   - Transfuse 1 unit pRBCs if hemoglobin <7  - Consider platelet transfusion if platelets continue to drop   - GI consult if hemoglobin continues to drop  - Continue ferrous sulfate  325mg  every other day  - Daily CBC    ##MASH cirrhosis - Portal Hypertension  Patient followed up with [facility] liver transplant team who noted no additional interval events or abnormalities. Patient is currently approved for liver transplantation but does not have a living donor option. Patient currently taking lactulose  and rifaximin  as well as budesonide  and spironolactone .   - Continue home lactulose , rifaximin , spironolactone   - Daily PT/INR    Chronic Problems     ##T2DM  - Monitor daily CMP     ##GERD  - Continue Pantoprazole  20 mg daily      ##Anxiety - MDD  - Continue Sertraline  100 mg daily     The patient's presentation is complicated by the following clinically significant conditions requiring additional evaluation and treatment: - Thrombocytopenia requiring further investigation or  monitor  - Chronic kidney disease POA requiring further investigation, treatment, or monitoring   - Chronic heart failure POA requiring further investigation, treatment, or monitoring  - Hypoxia requiring further investigation, treatment, or monitoring  - Anemia requiring at least daily CBC for further monitoring      Daily Checklist:  Diet: Regular Diet  DVT PPx: Lovenox  40mg  q24h (Hold iso anemia + thrombocytopenia)  Code Status: Full Code  Dispo: Patient appropriate for Inpatient based on expectation of ongoing need for hospitalization greater than two midnights based on severity of presentation/services including supplemental oxygen and IV diuresis.     Team Contact Information:   Primary Team: Family Medicine [facility]  Primary Resident: Ozell FORBES Bussing, MD  Resident's Pager: [facility] [phone])    Interval History:   NAEON. Patient's breathing continues to improve, on 2L [place] this morning. Reporting some abdominal pain responsive to Tylenol .     Objective:   Temp:  [36.8 ??C (98.2 ??F)-36.9 ??C (98.4 ??F)] 36.9 ??C (98.4 ??F)  Pulse:  [83-91] 83  Resp:  [16-18] 16  BP: (93-142)/(44-56) 103/56  SpO2:  [98 %-100 %] 98 %    Gen: Ill appearing female lying in hospital bed on2L [place]; converses appropriately  Eyes: Sclera anicteric, EOMI grossly normal   HENT: Atraumatic, normocephalic  Neck: Trachea midline  Heart: Regular rate and rhythm with systolic murmur best heard over LUSB & RUSB  Lungs: L lung fields clear throughout; diminished breath sounds over R lung fields  Abdomen: Mild generalized abdominal discomfort   Extremities: 2+ lower extremity edema bilaterally  Neuro: Grossly symmetric, non-focal  Skin:  No rashes, lesions on clothed exam  Psych: Alert, oriented

## 2024-07-23 NOTE — Consults (Signed)
 Adult Nutrition Assessment Note    Visit Type: RN Consult  Reason for Visit: Per Admission Nutrition Screen (Adult)    NUTRITION INTERVENTIONS and RECOMMENDATION     Weigh weekly    NUTRITION ASSESSMENT     Current nutrition therapy is appropriate and likely meeting nutritional needs at this time.   Reports good appetite and intake since admission    NUTRITIONALLY RELEVANT DATA     HPI & PMH:   59 y.o. female whose presentation is complicated by MASH cirrhosis, portal hypertension, T2DM, CHF, and HLD  that presented to [facility] Emergency Department with chest discomfort and shortness of breath.     Nutrition History:   01/16- Patient seen by RD last month during previous admission where patient endorses nausea on and off about 2-3 times per week over 6 months. Improvement in nausea over the week of last admission 12/18. Reported overall eating well over that 6 month period. Reports eating fine since last admission. Eats 2 meals daily at baseline.    Medications:  Nutritionally pertinent medications reviewed and evaluated for potential food and/or medication interactions.    Aldactone , Vitamin D3, Bumex , Protonix , NS 0.9% infusion at 100 ml/hr  Labs:   Nutritionally pertinent labs reviewed and include Na: 147 mmol/L    Nutritional Needs:   Healthy balance of carbohydrate, protein, and fat.     Anthropometric Data:  Height: 167.6 cm (5' 5.98)   Admission weight: 92 kg (202 lb 12.8 oz)  Last recorded weight: 92 kg (202 lb 12.8 oz) (07/21/24)  IBW: 58.98 kg  BMI: Body mass index is 32.75 kg/m??.   Usual Body Weight: around 202 lb per patient report  Weight Assessment: stable weight per patient report  Wt Readings from Last 20 Encounters:   07/21/24 92 kg (202 lb 12.8 oz)   07/13/24 92.8 kg (204 lb 8 oz)   07/05/24 93.5 kg (206 lb 1.6 oz)   06/24/24 (P) 90.8 kg (200 lb 1.6 oz)   06/22/24 89.3 kg (196 lb 14.4 oz)   04/29/24 89.9 kg (198 lb 1.6 oz)   02/16/24 90.2 kg (198 lb 12.8 oz)   02/09/24 90.7 kg (200 lb) 12/29/23 100.2 kg (221 lb)   12/29/23 100.2 kg (221 lb)   12/22/23 96.7 kg (213 lb 3.2 oz)   12/22/23 96.7 kg (213 lb 3.2 oz)   12/17/23 96.3 kg (212 lb 3.2 oz)   11/27/23 94.3 kg (208 lb)   11/03/23 92.5 kg (204 lb)   11/03/23 92.3 kg (203 lb 8 oz)   10/13/23 93.8 kg (206 lb 11.2 oz)   10/08/23 93.5 kg (206 lb 3.2 oz)   09/30/23 94.8 kg (209 lb)   09/29/23 94.2 kg (207 lb 11.2 oz)       Malnutrition Assessment:  Malnutrition Assessment using AND/ASPEN or GLIM Clinical Characteristics:    Patient does not meet AND/ASPEN criteria for malnutrition at this time (07/23/24 1320)               Nutrition Focused Physical Exam:  Nutrition Focused Physical Exam:                        Nutrition Evaluation  Overall Impressions: Nutrition-Focused Physical Exam not indicated due to lack of malnutrition risk factors. (07/23/24 1320)     Care plan:  Completed    Current Nutrition:  Oral intake   Nutrition Orders            Nutrition  Therapy Regular/House starting at 01/15 0054          Nutritionally Pertinent Allergies, Intolerances, Sensitivities, and/or Cultural/Religious Restrictions:  include shellfish    GOALS and EVALUATION     Patient to meet 75% or greater of nutritional needs via combination of meals, snacks, and/or oral supplements within admission.  - Met/No longer indicated    Motivation, Barriers, and Compliance:  Evaluation of motivation, barriers, and compliance completed. No concerns identified at this time.     Discharge Planning:   Monitor for potential discharge needs with multi-disciplinary team.         Follow-Up Parameters:   Signing off at this time (Please reconsult if needed)    Arleta Blanch, RD

## 2024-07-23 NOTE — Plan of Care (Signed)
 Problem: Adult Inpatient Plan of Care  Goal: Absence of Hospital-Acquired Illness or Injury  Outcome: Shift Focus  Intervention: Identify and Manage Fall Risk  Recent Flowsheet Documentation  Taken 07/23/2024 0200 by Gigi Gemma, RN  Safety Interventions:   fall reduction program maintained   low bed   nonskid shoes/slippers when out of bed  Taken 07/23/2024 0000 by Gigi Gemma, RN  Safety Interventions:   fall reduction program maintained   low bed   nonskid shoes/slippers when out of bed  Taken 07/22/2024 2200 by Gigi Gemma, RN  Safety Interventions:   fall reduction program maintained   low bed   nonskid shoes/slippers when out of bed  Taken 07/22/2024 2000 by Gigi Gemma, RN  Safety Interventions:   fall reduction program maintained   low bed   nonskid shoes/slippers when out of bed  Goal: Optimal Comfort and Wellbeing  Outcome: Shift Focus  Goal: Readiness for Transition of Care  Outcome: Shift Focus  Goal: Rounds/Family Conference  Outcome: Shift Focus     Problem: Self-Care Deficit  Goal: Improved Ability to Complete Activities of Daily Living  Outcome: Shift Focus   Shift Summary  Melatonin was administered for sleep, and pain remained at 0 throughout the shift.   Fall reduction interventions and safety measures were maintained, with no new injuries or hospital-acquired conditions documented.   Patient required assistance with most ADLs and used a walker due to left-sided weakness.   Family provided support during the shift, visiting and staying at bedside.   Overall, patient remained comfortable and safe, with stable condition and ongoing support for ADLs.     Absence of Hospital-Acquired Illness or Injury: Fall reduction interventions were consistently maintained throughout the shift, with side rails up, hourly checks, and toileting assistance provided; no alarms were present and the environment remained safe. No new injuries or hospital-acquired conditions were documented during this period.     Optimal Comfort and Wellbeing: Pain remained at 0 throughout the shift and melatonin was administered for sleep; patient reported stable comfort.     Readiness for Transition of Care: Unplanned readmission score decreased slightly during the shift, and patient was given information on advance directives, but does not have one documented for medical or mental health treatment.     Rounds/Family Conference: Family visited during the shift and was present at bedside for support.     Improved Ability to Complete Activities of Daily Living: Patient required assistance with most ADLs including dressing, grooming, bathing, toileting, transferring, and mobility, but was independent with feeding and oral care; left-sided weakness noted and a walker was used.

## 2024-07-23 NOTE — Progress Notes (Signed)
 Insurance has been verified for 2026      Patient has active coverage with [facility]), including prescription drug coverage via [employer] [phone]     Authorization is [id number] for Liver transplant listing valid 09/15/23 to 03/17/25      Patient is financially cleared for Liver transplant listing

## 2024-07-24 LAB — COMPREHENSIVE METABOLIC PANEL
ALBUMIN: 2.1 g/dL — ABNORMAL LOW (ref 3.4–5.0)
ALKALINE PHOSPHATASE: 115 U/L (ref 46–116)
ALT (SGPT): 39 U/L (ref 10–49)
ANION GAP: 14 mmol/L (ref 5–14)
AST (SGOT): 55 U/L — ABNORMAL HIGH (ref <=34)
BILIRUBIN TOTAL: 1.5 mg/dL — ABNORMAL HIGH (ref 0.3–1.2)
BLOOD UREA NITROGEN: 19 mg/dL (ref 9–23)
BUN / CREAT RATIO: 16
CALCIUM: 8.9 mg/dL (ref 8.7–10.4)
CHLORIDE: 98 mmol/L (ref 98–107)
CO2: 33.2 mmol/L — ABNORMAL HIGH (ref 20.0–31.0)
CREATININE: 1.22 mg/dL — ABNORMAL HIGH (ref 0.55–1.02)
EGFR CKD-EPI (2021) FEMALE: 52 mL/min/1.73m2 — ABNORMAL LOW (ref >=60)
GLUCOSE RANDOM: 115 mg/dL (ref 70–179)
POTASSIUM: 3.6 mmol/L (ref 3.5–5.1)
PROTEIN TOTAL: 5.4 g/dL — ABNORMAL LOW (ref 5.7–8.2)
SODIUM: 145 mmol/L (ref 135–145)

## 2024-07-24 LAB — CBC
HEMATOCRIT: 24.7 % — ABNORMAL LOW (ref 34.0–44.0)
HEMOGLOBIN: 8.2 g/dL — ABNORMAL LOW (ref 11.3–14.9)
MEAN CORPUSCULAR HEMOGLOBIN CONC: 33.4 g/dL (ref 32.0–36.0)
MEAN CORPUSCULAR HEMOGLOBIN: 26 pg (ref 25.9–32.4)
MEAN CORPUSCULAR VOLUME: 77.9 fL (ref 77.6–95.7)
MEAN PLATELET VOLUME: 10.8 fL — ABNORMAL HIGH (ref 6.8–10.7)
PLATELET COUNT: 53 10*9/L — ABNORMAL LOW (ref 150–450)
RED BLOOD CELL COUNT: 3.17 10*12/L — ABNORMAL LOW (ref 3.95–5.13)
RED CELL DISTRIBUTION WIDTH: 23.1 % — ABNORMAL HIGH (ref 12.2–15.2)
WBC ADJUSTED: 6 10*9/L (ref 3.6–11.2)

## 2024-07-24 LAB — MAGNESIUM: MAGNESIUM: 1.7 mg/dL (ref 1.6–2.6)

## 2024-07-24 LAB — PROTIME-INR
INR: 1.38
PROTIME: 15.6 s — ABNORMAL HIGH (ref 9.9–12.6)

## 2024-07-24 LAB — PHOSPHORUS: PHOSPHORUS: 3.9 mg/dL (ref 2.4–5.1)

## 2024-07-24 MED ADMIN — sertraline (ZOLOFT) tablet 100 mg: 100 mg | ORAL | @ 13:00:00

## 2024-07-24 MED ADMIN — rifAXIMin (XIFAXAN) tablet 550 mg: 550 mg | ORAL | @ 03:00:00 | Stop: 2025-07-21

## 2024-07-24 MED ADMIN — rifAXIMin (XIFAXAN) tablet 550 mg: 550 mg | ORAL | @ 13:00:00 | Stop: 2025-07-21

## 2024-07-24 MED ADMIN — spironolactone (ALDACTONE) tablet 200 mg: 200 mg | ORAL | @ 13:00:00

## 2024-07-24 MED ADMIN — naltrexone (DEPADE) tablet 50 mg: 50 mg | ORAL | @ 13:00:00

## 2024-07-24 MED ADMIN — pantoprazole (Protonix) EC tablet 20 mg: 20 mg | ORAL | @ 13:00:00

## 2024-07-24 MED ADMIN — cholecalciferol (vitamin D3 25 mcg (1,000 units)) tablet 50 mcg: 50 ug | ORAL | @ 13:00:00

## 2024-07-24 MED ADMIN — melatonin tablet 3 mg: 3 mg | ORAL | @ 03:00:00

## 2024-07-24 MED ADMIN — bumetanide (BUMEX) injection 4 mg: 4 mg | INTRAVENOUS | @ 13:00:00

## 2024-07-24 MED ADMIN — lactulose (CEPHULAC) packet 40 g: 40 g | ORAL | @ 13:00:00

## 2024-07-24 MED ADMIN — lactulose (CEPHULAC) packet 40 g: 40 g | ORAL | @ 18:00:00

## 2024-07-24 MED ADMIN — ferrous sulfate tablet 325 mg: 325 mg | ORAL | @ 13:00:00

## 2024-07-24 MED ADMIN — atorvastatin (LIPITOR) tablet 20 mg: 20 mg | ORAL | @ 13:00:00

## 2024-07-24 MED ADMIN — acetaminophen (TYLENOL) tablet 650 mg: 650 mg | ORAL | @ 03:00:00 | Stop: 2024-07-23

## 2024-07-24 NOTE — Plan of Care (Signed)
 Shift Summary  Assistance needs increased throughout the shift, with a notable decline in independence for both mobility and hygiene by the afternoon.   Bed alarm was activated in the late afternoon as part of ongoing fall prevention measures.   Comprehensive labs were drawn and reviewed, with several abnormal results noted.   Pain remained well controlled without intervention.   Overall, safety interventions were consistently maintained and no falls or injuries occurred during the shift.     Absence of Hospital-Acquired Illness or Injury: No new hospital-acquired injuries or illnesses were documented during the shift; skin protection measures such as incontinence pads and frequent repositioning were maintained, and lab results were reviewed for abnormalities.     Optimal Comfort and Wellbeing: Pain remained at 0 throughout the shift, and no interventions for pain were required or requested.     Improved Ability to Complete Activities of Daily Living: Assistance needs increased over the shift, with a transition from independent or minimal assist in the morning to maximum assist and dependent status for mobility and hygiene by the afternoon.     Absence of Fall and Fall-Related Injury: Fall reduction interventions were consistently maintained, including bed alarms, low bed position, and hourly visual checks, with no falls or injuries reported.

## 2024-07-24 NOTE — Plan of Care (Signed)
 Shift Summary  Acetaminophen  was administered PRN, and patient was later observed sleeping.   Melatonin was given PRN to support sleep.   Fall reduction strategies, including bed alarms and scheduled toileting, were maintained throughout the shift.   Moderate assistance was provided for mobility and hygiene, with adaptive equipment in use.   No falls or injuries occurred, and the environment remained safe.   Overall, comfort interventions and safety measures were maintained, with some support required for daily activities.     Optimal Comfort and Wellbeing: Intermittent chest discomfort with deep inhalation was reported with a pain score of 8, and acetaminophen  was administered PRN; patient was later observed sleeping, which may suggest some relief after intervention. Pain was described as acute and occurred mainly with deep breaths.     Improved Ability to Complete Activities of Daily Living: Mobility remained slightly limited and moderate assistance was required for activities, with minimal assist for hygiene and independence in oral care; adaptive equipment and routines were maintained to support self-care.     Absence of Fall and Fall-Related Injury: Fall reduction interventions were consistently maintained throughout the shift, including bed alarms, environmental modifications, and scheduled toileting; no falls or injuries were documented.

## 2024-07-25 LAB — COMPREHENSIVE METABOLIC PANEL
ALBUMIN: 2.1 g/dL — ABNORMAL LOW (ref 3.4–5.0)
ALKALINE PHOSPHATASE: 111 U/L (ref 46–116)
ALT (SGPT): 38 U/L (ref 10–49)
ANION GAP: 13 mmol/L (ref 5–14)
AST (SGOT): 56 U/L — ABNORMAL HIGH (ref <=34)
BILIRUBIN TOTAL: 1.7 mg/dL — ABNORMAL HIGH (ref 0.3–1.2)
BLOOD UREA NITROGEN: 17 mg/dL (ref 9–23)
BUN / CREAT RATIO: 15
CALCIUM: 8.8 mg/dL (ref 8.7–10.4)
CHLORIDE: 96 mmol/L — ABNORMAL LOW (ref 98–107)
CO2: 32.2 mmol/L — ABNORMAL HIGH (ref 20.0–31.0)
CREATININE: 1.14 mg/dL — ABNORMAL HIGH (ref 0.55–1.02)
EGFR CKD-EPI (2021) FEMALE: 56 mL/min/1.73m2 — ABNORMAL LOW (ref >=60)
GLUCOSE RANDOM: 125 mg/dL (ref 70–179)
POTASSIUM: 3.5 mmol/L (ref 3.5–5.1)
PROTEIN TOTAL: 5.3 g/dL — ABNORMAL LOW (ref 5.7–8.2)
SODIUM: 141 mmol/L (ref 135–145)

## 2024-07-25 LAB — CBC
HEMATOCRIT: 24.4 % — ABNORMAL LOW (ref 34.0–44.0)
HEMOGLOBIN: 8.2 g/dL — ABNORMAL LOW (ref 11.3–14.9)
MEAN CORPUSCULAR HEMOGLOBIN CONC: 33.5 g/dL (ref 32.0–36.0)
MEAN CORPUSCULAR HEMOGLOBIN: 26 pg (ref 25.9–32.4)
MEAN CORPUSCULAR VOLUME: 77.8 fL (ref 77.6–95.7)
MEAN PLATELET VOLUME: 10.6 fL (ref 6.8–10.7)
PLATELET COUNT: 54 10*9/L — ABNORMAL LOW (ref 150–450)
RED BLOOD CELL COUNT: 3.13 10*12/L — ABNORMAL LOW (ref 3.95–5.13)
RED CELL DISTRIBUTION WIDTH: 23 % — ABNORMAL HIGH (ref 12.2–15.2)
WBC ADJUSTED: 6.3 10*9/L (ref 3.6–11.2)

## 2024-07-25 LAB — PROTIME-INR
INR: 1.41
PROTIME: 16 s — ABNORMAL HIGH (ref 9.9–12.6)

## 2024-07-25 LAB — PHOSPHORUS: PHOSPHORUS: 2.8 mg/dL (ref 2.4–5.1)

## 2024-07-25 LAB — MAGNESIUM: MAGNESIUM: 1.7 mg/dL (ref 1.6–2.6)

## 2024-07-25 MED ORDER — BUMETANIDE 1 MG TABLET
ORAL_TABLET | Freq: Two times a day (BID) | ORAL | 0 refills | 30.00000 days
Start: 2024-07-25 — End: 2024-08-24

## 2024-07-25 MED ADMIN — sertraline (ZOLOFT) tablet 100 mg: 100 mg | ORAL | @ 13:00:00

## 2024-07-25 MED ADMIN — rifAXIMin (XIFAXAN) tablet 550 mg: 550 mg | ORAL | @ 13:00:00 | Stop: 2025-07-21

## 2024-07-25 MED ADMIN — rifAXIMin (XIFAXAN) tablet 550 mg: 550 mg | ORAL | @ 03:00:00 | Stop: 2025-07-21

## 2024-07-25 MED ADMIN — spironolactone (ALDACTONE) tablet 200 mg: 200 mg | ORAL | @ 13:00:00

## 2024-07-25 MED ADMIN — simethicone (MYLICON) chewable tablet 80 mg: 80 mg | ORAL | @ 22:00:00

## 2024-07-25 MED ADMIN — naltrexone (DEPADE) tablet 50 mg: 50 mg | ORAL | @ 13:00:00

## 2024-07-25 MED ADMIN — pantoprazole (Protonix) EC tablet 20 mg: 20 mg | ORAL | @ 13:00:00

## 2024-07-25 MED ADMIN — cholecalciferol (vitamin D3 25 mcg (1,000 units)) tablet 50 mcg: 50 ug | ORAL | @ 13:00:00

## 2024-07-25 MED ADMIN — melatonin tablet 3 mg: 3 mg | ORAL | @ 03:00:00

## 2024-07-25 MED ADMIN — lactulose (CEPHULAC) packet 40 g: 40 g | ORAL | @ 13:00:00 | Stop: 2024-07-25

## 2024-07-25 MED ADMIN — lactulose (CEPHULAC) packet 40 g: 40 g | ORAL | @ 19:00:00

## 2024-07-25 MED ADMIN — lactulose (CEPHULAC) packet 40 g: 40 g | ORAL | @ 22:00:00

## 2024-07-25 MED ADMIN — bumetanide (BUMEX) tablet 2 mg: 2 mg | ORAL | @ 19:00:00

## 2024-07-25 MED ADMIN — atorvastatin (LIPITOR) tablet 20 mg: 20 mg | ORAL | @ 13:00:00

## 2024-07-25 MED ADMIN — bumetanide (BUMEX) tablet 2 mg: 2 mg | ORAL | @ 13:00:00 | Stop: 2024-07-25

## 2024-07-25 NOTE — Consults (Signed)
 PHYSICAL THERAPY  Evaluation (07/25/24 1235)          Patient Name:  Denise York       Medical Record Number: [id number]   Date of Birth: [birth year]  Sex: Female        Post-Discharge Physical Therapy Recommendations:  PT Post Acute Discharge Recommendations: 3x weekly   Discharge transport recommendations : Ambulatory  Equipment Recommendation  PT DME Recommendations: None          Treatment Diagnosis: Generalized muscle weakness        ASSESSMENT  Problem List: Decreased endurance, Decreased mobility, Decreased strength, Fall risk, At risk for deconditioning      Assessment : Denise York is a 59 y.o. female whose presentation is complicated by MASH cirrhosis, portal hypertension, T2DM, CHF, and HLD  that presented to [facility] Emergency Department with chest discomfort and shortness of breath.    Pt presents to PT with above deficits and would benefit from acute PT services to address objective findings.  Pt agreeable to PT and was able to transfer out of bed to [facility] with supervision assist progressing to Mod I assist.  Pt amb in room and short distance in hallway with slight SOB, steady and slow which is close to pt's baseline.  Encouraged pt to use [facility] for toileting in order to prepare for discharge home.  Recommend post acute services 3x/week.     Today's Interventions: PT eval, vital monitoring, bed mobility, STS transfer, toileting, pericare, dressing, ambulation; pt ed re: role of PT, POC, fall risk reduction     Personal Factors/Comorbidities Present: 3+ factors   Examination of Body systems: 4+ elements  Clinical Presentation: Evolving    Eval Complexity : Moderate Complexity     Activity Tolerance: Tolerated treatment well       PLAN  Planned Frequency of Treatment: Plan of Care Initiated: 07/25/24  1-2x per day          Planned Interventions: Education (Patient/Family/Caregiver), Gait training, Self-care / Home Management training, Therapeutic Exercise, Therapeutic Activity     Goals: Patient and Family Goals: Return home     SHORT GOAL #1: Pt will perform all functional transfers with Mod I assist using LRAD.               Time Frame : 2 weeks  SHORT GOAL #2: Pt will ambulate for 200' with Mod I assist using LRAD.              Time Frame : 2 weeks          Demetrias Goodbar Term Goal #1: Independent household ambulation.  Time Frame: 4 weeks     Prognosis:  Good  Positive Indicators: participates with PT  Barriers to Discharge: Endurance deficits, Functional strength deficits     SUBJECTIVE  Communication Preference: Verbal     Patient reports: Agreeable to PT, feels like she is close to baseline  Pain Comments: no current c/o     Services patient receives prior to admission: PT  Prior Functional Status: Mod-I ADLs with increased time and use of AD limited by significant fatigue, uses shower bench and a rollator for seated rest breaks for energy conservation. Husband does all cooking, cleaning, and shopping (no driving for either of them). Ambulates with rollator in household; does not go into community anymore except for MD appointments.  Has a history of falls.  Living Situation  Living Environment: House  Lives With: Spouse, Family (step-daughter and son-in-law)  Home  Living: One level home, Able to Live on main level with bedroom/bathroom, Level entry, Walk-in shower, Grab bars in shower, Grab bars around toilet, Ramped entrance, Comfort height commode seat, Built-in shower seat, Hand-held shower hose  Caregiver Identified?: Yes  Caregiver Availability: 24 hours  Caregiver Ability: Supervision  Caregiver Identified?: Yes   Equipment available at home: Rollator, Bedside commode        Past Medical History[1]         Social History     Tobacco Use    Smoking status: Never     Passive exposure: Past    Smokeless tobacco: Never   Substance Use Topics    Alcohol use: No       Past Surgical History[2]          Family History[3]     Allergies: Shellfish containing products, Glipizide , and Lisinopril  Objective Findings  Precautions / Restrictions  Precautions: Falls precautions  Required Braces or Orthoses: Non-applicable        Equipment / Environment: Vascular access (PIV, TLC, Port-a-cath, PICC)     Vitals/Orthostatics : VSS on room air, SpO2 96% HR 107 after activity, SOB noted which pt reports is close to baseline     Cognition: WFL  Cognition comment: Alert and pleasant.  Orientation: Oriented x4  Visual/Perception: Wears glasses for reading only  Hearing: No deficit identified     Skin Inspection: Redness  Skin Inspection comment: buttocks     Upper Extremities  UE ROM: Right WFL, Left WFL  UE Strength: Right WFL, Left WFL    Lower Extremities  LE ROM: Right WFL, Left WFL  LE Strength: Right WFL, Left WFL  LE comment: able to complete body weight transfers, generalized deconditioning at baseline due to chronic illness       Coordination: WFL  Proprioception: Not tested  Sensation: WFL  Posture: Rounded shoulders, Forward head    Static Sitting-Level of Assistance: Independent  Dynamic Sitting-Level of Assistance: Archivist Standing-Level of Assistance: Modified independent  Dynamic Standing - Level of Assistance: Supervision  Standing Balance comments: RW support in standing      Bed Mobility        Supine to Sit assistance level: Modified independent, requires aide device or extra time     Bed Mobility comments: moving easily to EOB with slight increase in time    Transfers  Sit to Stand assistance level: Modified independent, requires aide device or extra time     Transfer comments: RW from EOB and BSC    Ambulation  Level of Assistance: Modified independent, requires aide device or extra time  Assistive Device: Rolling walker   Distance Ambulated (ft): 60 ft   Ambulation comments: using RW and supervision to Mod I assist, decreased gait speed with reciprocal gait pattern, equal but short step length bilaterally with slight increase in lateral sway       Stairs:NA--pt does not have stairs at home        Endurance: SOB with exertion, SpO2 96%      AM-PAC-6 click  Help currently need turning over In bed?: None - Modified Independent/Independent  Help currently needed sitting down/standing up from chair with arms? : None - Modified Independent/Independent  Help currently needed moving from supine to sitting on edge of bed?: None - Modified Independent/Independent  Help currently needed moving to and from bed from wheelchair?: None - Modified Independent/Independent  Help currently needed walking in a hospital room?: A Little - Minimal/Contact Guard  Assist/Supervision     19/20       6 click Score (in points): % of Functional Impairment, Limitation, Restriction  6: 100% impaired, limited, restricted  7-8: At least 80%, but less than 100% impaired, limited restricted  9-13: At least 60%, but less than 80% impaired, limited restricted  14-19: At least 40%, but less than 60% impaired, limited restricted  20-22: At least 20%, but less than 40% impaired, limited restricted  23: At least 1%, but less than 20% impaired, limited restricted  24: 0% impaired, limited restricted    'AM-PAC' forms are Copyright protected by [employer]     Physical Therapy Session Duration  PT Individual [mins]: 40    I attest that I have reviewed the above information.  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    Obesity     Prediabetes    [2]   Past Surgical History:  Procedure Laterality Date    ABDOMINAL WALL MESH  REMOVAL  1997, 2005    Placement, 1997, removal and replacement 2005    BLADDER SURGERY      bladder tact X2    BREAST BIOPSY Right 04/2013    benign BREAST EXCISIONAL BIOPSY Right 05/2013    benign    BREAST SURGERY Right     precancerous spot removed    CHOLECYSTECTOMY      HYSTERECTOMY      IR EMBOLIZATION VENOUS OTHER THAN HEMORRHAGE  02/17/2023    IR EMBOLIZATION VENOUS OTHER THAN HEMORRHAGE 02/17/2023 Babara Lash, MD IMG VIR [facility]    PR COLONOSCOPY FLX DX W/COLLJ SPEC WHEN PFRMD N/A 08/15/2023    Procedure: COLONOSCOPY, FLEXIBLE, PROXIMAL TO SPLENIC FLEXURE; DIAGNOSTIC, W/WO COLLECTION SPECIMEN BY BRUSH OR WASH;  Surgeon: Charlanne Kipper, MD;  Location: [facility] [place];  Service: Gastrointestinal    PR COLSC FLX W/RMVL OF TUMOR POLYP LESION SNARE TQ N/A 10/14/2016    Procedure: COLONOSCOPY FLEX; W/REMOV TUMOR/LES BY SNARE;  Surgeon: Eleanor Dewey Sorrel, MD;  Location: [facility] [place];  Service: Gastroenterology    PR COLSC FLX W/RMVL OF TUMOR POLYP LESION SNARE TQ N/A 01/15/2023    Procedure: COLONOSCOPY FLEX; W/REMOV TUMOR/LES BY SNARE;  Surgeon: Fae Alto, MD;  [facility];  Service: Gastroenterology    PR GI IMAG INTRALUMINAL ESOPHAGUS-ILEUM W/I&R N/A 08/15/2023    Procedure: GI VIDEO TRACT IMAGE INTRALUMINAL (EG, CAPSULE ENDOSCOPY), ESOPHAGUS VIA ILEUM, PHYSICIAN INTERPRETATION & REPORT;  Surgeon: Charlanne Kipper, MD;  Location: [facility];  Service: Gastrointestinal    PR PERC CLOS,CONG INTERATRIAL COMMUN W/IMPL N/A 09/12/2022    Procedure: ASD closure;  Surgeon: Pia Sharper, MD;  Location: [facility] C[facility];  Service: Cardiology    PR UPPER GI ENDOSCOPY,BIOPSY N/A 01/15/2023    Procedure: UGI ENDOSCOPY; WITH BIOPSY, SINGLE OR MULTIPLE;  Surgeon: Fae Alto, MD;  [facility];  Service: Gastroenterology    PR UPPER GI ENDOSCOPY,DIAGNOSIS N/A 08/14/2023    Procedure:  UGI ENDO, INCLUDE ESOPHAGUS, STOMACH, & DUODENUM &/OR JEJUNUM; DX W/WO COLLECTION SPECIMN, BY BRUSH OR WASH;  Surgeon: Charlanne Kipper, MD;  [facility]: [facility];  Service: Gastrointestinal    SKIN BIOPSY [3]   Family History  Problem Relation Age of Onset    Heart disease Mother     Hypertension Mother     COPD Mother     Heart disease Father     Kidney disease Father     Allergies Father     Gout Father     Diabetes Father     Allergies Other         FAMILY H/O    Coronary artery disease Other         FAMILY H/O    Kidney failure Other         FAMILY H/O    No Known Problems Sister     No Known Problems Daughter     No Known Problems Maternal Grandmother     No Known Problems Maternal Grandfather     No Known Problems Paternal Grandmother     No Known Problems Paternal Grandfather     Alcohol abuse Maternal Uncle     Birth defects Son     Birth defects Son     BRCA 1/2 Neg Hx     Breast cancer Neg Hx     Cancer Neg Hx     Colon cancer Neg Hx     Endometrial cancer Neg Hx     Ovarian cancer Neg Hx     Substance Abuse Disorder Neg Hx     Mental illness Neg Hx

## 2024-07-25 NOTE — Plan of Care (Signed)
 Shift Summary  Melatonin was administered at 10:18 PM to support sleep.   Lactulose  was refused at 10:18 PM.   Bed alarm and fall reduction interventions were maintained throughout the shift, with no falls or injuries reported.   Mobility and hygiene assistance were provided as needed, with encouragement for independence in self-care.   Comfort was maintained and pain remained at 0 during the shift.    Optimal Comfort and Wellbeing: Pain remained at 0 throughout the shift, and melatonin was administered for sleep at 10:18 PM. No discomfort was reported or observed during hourly checks.    Improved Ability to Complete Activities of Daily Living: Mobility was slightly limited, and minimal assistance was required for hygiene and bathing, with independence encouraged for self-care tasks; adaptive equipment and a front wheel walker were used as needed.    Absence of Fall and Fall-Related Injury: Bed alarm and fall reduction interventions were consistently maintained, with regular toileting and hourly visual checks performed; no falls or injuries occurred during the shift.

## 2024-07-25 NOTE — Plan of Care (Signed)
 Shift Summary  Lactulose  and bumetanide  were administered twice during the shift, supporting ongoing medical management.   Multiple laboratory tests were completed, providing updated information for care planning.   Physical therapy recommendations and discharge planning were addressed, with equipment needs and therapy frequency established.   Safety interventions and fall prevention strategies were maintained throughout the shift, with no reported falls or injuries.   Overall, patient remained stable with continued progress toward discharge goals.     Absence of Hospital-Acquired Illness or Injury: No new hospital-acquired injuries documented; skin inspection revealed redness on buttocks, which was monitored throughout the shift. Laboratory results showed some abnormal values, but no acute changes were noted during the shift.     Readiness for Transition of Care: Discharge planning progressed with PT recommending 3x weekly therapy and equipment needs identified; patient and family expressed goal to return home, and plan of care was initiated.     Improved Ability to Complete Activities of Daily Living: Patient required minimal assistance for hygiene and was independent with oral care; modified independence noted for transfers and mobility, with some supervision needed for walking and standing.     Absence of Fall and Fall-Related Injury: Fall reduction program and safety interventions were consistently maintained, with hourly visual checks and safe environment documented; no falls or injuries occurred during the shift.

## 2024-07-25 NOTE — Progress Notes (Incomplete)
 Family Medicine Inpatient Progress Note  ***CHANGE DATE***    Assessment & Plan:   Denise York is a 59 y.o. female whose presentation is complicated by MASH cirrhosis, portal hypertension, T2DM, CHF, HLD, and breast cancer that presented to [facility] Emergency Department with chest discomfort and shortness of breath, found to have a Rt pleural effusion.     Principal Problem:    Pleural effusion, right  Active Problems:    Type 2 diabetes mellitus without complications (CMS-HCC)    Decompensated cirrhosis    (CMS-HCC)    Portal hypertension    (CMS-HCC)    Metabolic dysfunction-associated steatohepatitis (MASH)    Anemia    Active Problems    ##SOB - R pleural effusion  Patient presents with 2 day hx of chest discomfort and shortness of breath. Decided to come to ED as she was unable to fall asleep due to severity of SOB. Oxygen sats have ranged from high 80s to low 90s on 2 L [place] while in the emergency room. ED workup notable for large R loculated pleural effusion. Negative troponins and normal EKG. ProBNP elevated to 522 though decreased from most recent level of 972. Physical exam revealed decreased breath sounds over R side. Echo obtained revealed EF at 65-70%, with mildly increased LV wall thickness and mild mitral valve regurgitation. Given this, pleural effusion less likely CHF related, and likely secondary to cirrhosis.  - Continue PO bumex  2mg  BID  - Consider thoracentesis with pleural fluid analysis if inadequate response to IV diuresis  - Strict I/Os  - Daily CBC & CMP   - PT/OT consulted    ##Anemia I Thrombocytopenia  Patient was hospitalized from December 17 to 19 for recurrent falls and anemia. Patient was found to be anemic at 5.8 requiring 2 units PRBC. Recent Iron  panel in 12/'25 evident for Ferritin 9.2, Iron  58, TIC 297.4. Etiology of this anemia was suspected to be due to iron  deficiency at the time given no concern for GI bleed or other hemorrhagic sources. Patient ultimately discharged with oral iron  supplementation. At time of admission, Hgb 7.7 and platelets 101 without signs of active bleed. Patient received 1 unit pRBC transfusion on 1/15, which increased Hgb to 8.8, but has downtrended to 8.2 this AM. Platelets remain low at 53, with no petechiae or purpura.   - Transfuse 1 unit pRBCs if hemoglobin <7  - Consider platelet transfusion if platelets continue to drop   - GI consult if hemoglobin continues to drop  - Continue ferrous sulfate  325mg  every other day  - Daily CBC    ##MASH cirrhosis - Portal Hypertension  Patient followed up with [facility] liver transplant team who noted no additional interval events or abnormalities. Patient is currently approved for liver transplantation but does not have a living donor option. Patient currently taking lactulose  and rifaximin  as well as budesonide  and spironolactone .   - Continue home lactulose , rifaximin , spironolactone   - Daily PT/INR  - Titrate lactulose  to achieve BMs 4-5x daily    Chronic Problems  ##GERD  - Continue Pantoprazole  20 mg daily      ##Anxiety - MDD  - Continue Sertraline  100 mg daily     The patient's presentation is complicated by the following clinically significant conditions requiring additional evaluation and treatment: - Thrombocytopenia requiring further investigation or monitor  - Chronic kidney disease POA requiring further investigation, treatment, or monitoring   - Chronic heart failure POA requiring further investigation, treatment, or monitoring  - Hypoxia requiring  further investigation, treatment, or monitoring  - Anemia requiring at least daily CBC for further monitoring      Daily Checklist:  Diet: Regular Diet  DVT PPx: Lovenox  40mg  q24h (Hold iso anemia + thrombocytopenia)  Code Status: Full Code  Dispo: Patient appropriate for Inpatient based on expectation of ongoing need for hospitalization greater than two midnights based on severity of presentation/services including supplemental oxygen and IV diuresis.     Team Contact Information:   Primary Team: Family Medicine Blue  Primary Resident: Ozell FORBES Bussing, MD  Resident's Pager: Barbar Suellyn Carbon [phone])    Interval History:   One-liner:  Denise York is a 59 y.o. female whose presentation is complicated by MASH cirrhosis, portal hypertension, T2DM, CHF, HLD, and breast cancer that presented to [facility] Emergency Department with chest discomfort and shortness of breath, found to have a Rt pleural effusion.     Recent changes  - IV > PO Bumex   - Connected with HH    Overnight:      Subjective:      Vitals:  I&O:    PE:    Labs:  Hgb 8.2 >     To Do  - Discharge with Pulm follow up  - Order CBC, BMP      Objective:   Temp:  [36.6 ??C (97.8 ??F)-36.8 ??C (98.2 ??F)] 36.7 ??C (98.1 ??F)  Pulse:  [90-93] 90  Resp:  [18] 18  BP: (114-133)/(57-84) 116/60  SpO2:  [97 %-98 %] 98 %    Gen: Ill appearing female lying in hospital bed on2L ; converses appropriately  Eyes: Sclera anicteric, EOMI grossly normal   Heart: Regular rate and rhythm with systolic murmur best heard over LUSB & RUSB  Lungs: L lung fields clear throughout; diminished breath sounds over R lung fields  Abdomen: Mild generalized abdominal discomfort   Extremities: 2+ lower extremity edema bilaterally  Psych: Alert, oriented

## 2024-07-25 NOTE — Progress Notes (Cosign Needed)
 Family Medicine Inpatient Progress Note    Assessment & Plan:   Denise York is a 59 y.o. female whose presentation is complicated by MASH cirrhosis, portal hypertension, T2DM, CHF, HLD, and breast cancer that presented to [facility] Emergency Department with chest discomfort and shortness of breath, found to have a Rt pleural effusion.     Principal Problem:    Pleural effusion, right  Active Problems:    Type 2 diabetes mellitus without complications (CMS-HCC)    Decompensated cirrhosis    (CMS-HCC)    Portal hypertension    (CMS-HCC)    Metabolic dysfunction-associated steatohepatitis (MASH)    Anemia    Active Problems    ##SOB - R pleural effusion  Patient presents with 2 day hx of chest discomfort and shortness of breath. Decided to come to ED as she was unable to fall asleep due to severity of SOB. Oxygen sats have ranged from high 80s to low 90s on 2 L [place] while in the emergency room. ED workup notable for large R loculated pleural effusion. Negative troponins and normal EKG. ProBNP elevated to 522 though decreased from most recent level of 972. Physical exam revealed decreased breath sounds over R side. Echo obtained revealed EF at 65-70%, with mildly increased LV wall thickness and mild mitral valve regurgitation. Given this, pleural effusion less likely CHF related, and likely secondary to cirrhosis.  - Continue IV Bumex  4mg  -> transitioned to PO bumex  2mg  BID  - Consider thoracentesis with pleural fluid analysis if inadequate response to IV diuresis  - Strict I/Os  - Daily CBC & CMP   - PT/OT consulted    ##Anemia I Thrombocytopenia  Patient was hospitalized from December 17 to 19 for recurrent falls and anemia. Patient was found to be anemic at 5.8 requiring 2 units PRBC. Recent Iron  panel in 12/'25 evident for Ferritin 9.2, Iron  58, TIC 297.4. Etiology of this anemia was suspected to be due to iron  deficiency at the time given no concern for GI bleed or other hemorrhagic sources. Patient ultimately discharged with oral iron  supplementation. At time of admission, Hgb 7.7 and platelets 101 without signs of active bleed. Patient received 1 unit pRBC transfusion on 1/15, which increased Hgb to 8.8, but has downtrended to 8.2 this AM. Platelets remain low at 53, with no petechiae or purpura.   - Transfuse 1 unit pRBCs if hemoglobin <7  - Consider platelet transfusion if platelets continue to drop   - GI consult if hemoglobin continues to drop  - Continue ferrous sulfate  325mg  every other day  - Daily CBC    ##MASH cirrhosis - Portal Hypertension  Patient followed up with [facility] liver transplant team who noted no additional interval events or abnormalities. Patient is currently approved for liver transplantation but does not have a living donor option. Patient currently taking lactulose  and rifaximin  as well as budesonide  and spironolactone .   - Continue home lactulose , rifaximin , spironolactone   - Daily PT/INR  -Encourage PM lactulose  to achieve BMs 4-5x daily    Chronic Problems  ##GERD  - Continue Pantoprazole  20 mg daily      ##Anxiety - MDD  - Continue Sertraline  100 mg daily     The patient's presentation is complicated by the following clinically significant conditions requiring additional evaluation and treatment: - Thrombocytopenia requiring further investigation or monitor  - Chronic kidney disease POA requiring further investigation, treatment, or monitoring   - Chronic heart failure POA requiring further investigation, treatment, or monitoring  -  Hypoxia requiring further investigation, treatment, or monitoring  - Anemia requiring at least daily CBC for further monitoring      Daily Checklist:  Diet: Regular Diet  DVT PPx: Lovenox  40mg  q24h (Hold iso anemia + thrombocytopenia)  Code Status: Full Code  Dispo: Patient appropriate for Inpatient based on expectation of ongoing need for hospitalization greater than two midnights based on severity of presentation/services including supplemental oxygen and IV diuresis.     Team Contact Information:   Primary Team: Family Medicine Blue  Primary Resident: LOISE Loraine Louder, MD  Resident's Pager: [facility] [phone])    Interval History:   Overnight: no acute events    This AM: Patient is breathing better, has remained off room air. A little bit slow to answer. Says pooped 3 times within the past 24 hours, usually at home poops 4-5x day. Has been refusing the PM dose of lactulose .    VSS, on room air  HgB stable 8.2, PLT 54  K 3.5, Mg 1.7  Cr 1.14  PT/INR 16/1.4      Objective:   Temp:  [36.8 ??C (98.2 ??F)-36.9 ??C (98.4 ??F)] 36.8 ??C (98.2 ??F)  Pulse:  [83-92] 92  Resp:  [16-18] 18  BP: (93-133)/(44-64) 133/57  SpO2:  [97 %-100 %] 97 %    Gen: Ill appearing female lying in hospital bed on2L [place]; converses appropriately  Eyes: Sclera anicteric, EOMI grossly normal   Heart: Regular rate and rhythm with systolic murmur best heard over LUSB & RUSB  Lungs: L lung fields clear throughout; diminished breath sounds over R lung fields  Abdomen: Mild generalized abdominal discomfort   Extremities: 2+ lower extremity edema bilaterally  Psych: Alert, oriented    N. Loraine Louder, MD MPH  FM PGY-3

## 2024-07-26 LAB — COMPREHENSIVE METABOLIC PANEL
ALBUMIN: 2.2 g/dL — ABNORMAL LOW (ref 3.4–5.0)
ALKALINE PHOSPHATASE: 118 U/L — ABNORMAL HIGH (ref 46–116)
ALT (SGPT): 37 U/L (ref 10–49)
ANION GAP: 14 mmol/L (ref 5–14)
AST (SGOT): 58 U/L — ABNORMAL HIGH (ref <=34)
BILIRUBIN TOTAL: 1.9 mg/dL — ABNORMAL HIGH (ref 0.3–1.2)
BLOOD UREA NITROGEN: 11 mg/dL (ref 9–23)
BUN / CREAT RATIO: 10
CALCIUM: 8.8 mg/dL (ref 8.7–10.4)
CHLORIDE: 95 mmol/L — ABNORMAL LOW (ref 98–107)
CO2: 30.4 mmol/L (ref 20.0–31.0)
CREATININE: 1.1 mg/dL — ABNORMAL HIGH (ref 0.55–1.02)
EGFR CKD-EPI (2021) FEMALE: 58 mL/min/1.73m2 — ABNORMAL LOW (ref >=60)
GLUCOSE RANDOM: 123 mg/dL (ref 70–179)
POTASSIUM: 3.5 mmol/L (ref 3.4–4.8)
PROTEIN TOTAL: 5.7 g/dL (ref 5.7–8.2)
SODIUM: 139 mmol/L (ref 135–145)

## 2024-07-26 LAB — MAGNESIUM: MAGNESIUM: 1.5 mg/dL — ABNORMAL LOW (ref 1.6–2.6)

## 2024-07-26 LAB — CBC
HEMATOCRIT: 26.2 % — ABNORMAL LOW (ref 34.0–44.0)
HEMOGLOBIN: 8.6 g/dL — ABNORMAL LOW (ref 11.3–14.9)
MEAN CORPUSCULAR HEMOGLOBIN CONC: 32.9 g/dL (ref 32.0–36.0)
MEAN CORPUSCULAR HEMOGLOBIN: 26 pg (ref 25.9–32.4)
MEAN CORPUSCULAR VOLUME: 79.1 fL (ref 77.6–95.7)
MEAN PLATELET VOLUME: 11 fL — ABNORMAL HIGH (ref 6.8–10.7)
PLATELET COUNT: 60 10*9/L — ABNORMAL LOW (ref 150–450)
RED BLOOD CELL COUNT: 3.3 10*12/L — ABNORMAL LOW (ref 3.95–5.13)
RED CELL DISTRIBUTION WIDTH: 23.3 % — ABNORMAL HIGH (ref 12.2–15.2)
WBC ADJUSTED: 6.9 10*9/L (ref 3.6–11.2)

## 2024-07-26 LAB — PROTIME-INR
INR: 1.4
PROTIME: 15.8 s — ABNORMAL HIGH (ref 9.9–12.6)

## 2024-07-26 LAB — PHOSPHORUS: PHOSPHORUS: 2.8 mg/dL (ref 2.4–5.1)

## 2024-07-26 MED ORDER — BUMETANIDE 2 MG TABLET
ORAL_TABLET | Freq: Two times a day (BID) | ORAL | 0 refills | 90.00000 days | Status: CP
Start: 2024-07-26 — End: 2024-10-24

## 2024-07-26 MED ORDER — BUMETANIDE 1 MG TABLET
ORAL_TABLET | Freq: Two times a day (BID) | ORAL | 0 refills | 30.00000 days | Status: CN
Start: 2024-07-26 — End: 2024-08-25

## 2024-07-26 MED ADMIN — sertraline (ZOLOFT) tablet 100 mg: 100 mg | ORAL | @ 13:00:00 | Stop: 2024-07-26

## 2024-07-26 MED ADMIN — magnesium sulfate in D5W 1 gram/100 mL infusion 1 g: 1 g | INTRAVENOUS | @ 16:00:00 | Stop: 2024-07-26

## 2024-07-26 MED ADMIN — rifAXIMin (XIFAXAN) tablet 550 mg: 550 mg | ORAL | @ 02:00:00 | Stop: 2025-07-21

## 2024-07-26 MED ADMIN — rifAXIMin (XIFAXAN) tablet 550 mg: 550 mg | ORAL | @ 13:00:00 | Stop: 2024-07-26

## 2024-07-26 MED ADMIN — spironolactone (ALDACTONE) tablet 200 mg: 200 mg | ORAL | @ 13:00:00 | Stop: 2024-07-26

## 2024-07-26 MED ADMIN — simethicone (MYLICON) chewable tablet 80 mg: 80 mg | ORAL | @ 17:00:00 | Stop: 2024-07-26

## 2024-07-26 MED ADMIN — simethicone (MYLICON) chewable tablet 80 mg: 80 mg | ORAL | @ 10:00:00 | Stop: 2024-07-26

## 2024-07-26 MED ADMIN — simethicone (MYLICON) chewable tablet 80 mg: 80 mg | ORAL | @ 02:00:00

## 2024-07-26 MED ADMIN — naltrexone (DEPADE) tablet 50 mg: 50 mg | ORAL | @ 13:00:00 | Stop: 2024-07-26

## 2024-07-26 MED ADMIN — pantoprazole (Protonix) EC tablet 20 mg: 20 mg | ORAL | @ 13:00:00 | Stop: 2024-07-26

## 2024-07-26 MED ADMIN — cholecalciferol (vitamin D3 25 mcg (1,000 units)) tablet 50 mcg: 50 ug | ORAL | @ 13:00:00 | Stop: 2024-07-26

## 2024-07-26 MED ADMIN — melatonin tablet 3 mg: 3 mg | ORAL | @ 02:00:00

## 2024-07-26 MED ADMIN — ferrous sulfate tablet 325 mg: 325 mg | ORAL | @ 13:00:00 | Stop: 2024-07-26

## 2024-07-26 MED ADMIN — bumetanide (BUMEX) tablet 2 mg: 2 mg | ORAL | @ 10:00:00 | Stop: 2024-07-26

## 2024-07-26 MED ADMIN — atorvastatin (LIPITOR) tablet 20 mg: 20 mg | ORAL | @ 13:00:00 | Stop: 2024-07-26

## 2024-07-26 NOTE — Plan of Care (Signed)
 Patient alert and oriented x4, vitals stable. SBA pivot to commode. Patient received IV magnesium  before discharging to home with family member. Patient calm and cooperative with care. AVS reviewed with patient, patient verbalizes understanding. Patient denied pain. IV removed.  Problem: Adult Inpatient Plan of Care  Goal: Absence of Hospital-Acquired Illness or Injury  Outcome: Shift Focus  Goal: Readiness for Transition of Care  Outcome: Shift Focus  Goal: Rounds/Family Conference  Outcome: Shift Focus     Problem: Self-Care Deficit  Goal: Improved Ability to Complete Activities of Daily Living  Outcome: Shift Focus     Problem: Fall Injury Risk  Goal: Absence of Fall and Fall-Related Injury  Outcome: Shift Focus

## 2024-07-26 NOTE — Consults (Addendum)
 OCCUPATIONAL THERAPY  Evaluation (07/26/24 0845)    Patient Name:  Denise York       Medical Record Number: [id number]     Date of Birth: August 31, 1965  Sex: Female      Post-Discharge Occupational Therapy Recommendations: 3x weekly   OT DME Recommendations: None       OT Treatment Diagnosis: Reduced mobility, Limitation of activities due to disability         Assessment: LEDA BELLEFEUILLE is a 59 y.o. female whose presentation is complicated by MASH cirrhosis, portal hypertension, T2DM, CHF, HLD, and breast cancer that presented to [facility] Emergency Department with chest discomfort and shortness of breath, found to have a Rt pleural effusion.     Pt reports PLOF as Mod-I with decreased endurance at baseline. Pt lives with her husband who is retired and able to provide assist as needed. This date, pt presents to skilled acute OT evaluation near functional baseline, primarily limited by decreased strength and endurance. During session, pt performed bed mobility, sit<>stand transfers, and short distance ambulation bed<>door with SBA-Mod I and no AD. Pt will continue to benefit from receiving skilled acute OT services to address above deficits, with post-acute OT recommendations at 3x for fall prevention and to maximize engagement in meaningful routines.    Problem List: Decreased activity tolerance, Decreased endurance, Decreased mobility, Decreased strength, Fall risk, Impaired ADLs, Impaired balance  Personal Factors/Comorbidities (Occupational Profile and History Review): Expanded (Moderate)  Assessment of Occupational Performance : Endurance, Fine or gross motor coordination, Strength, Sensation, Mobility, Balance, Cognitive skills, Dexterity  Clinical Decision Making: Moderate Complexity       Today's Interventions: OT evaluation, AM-PAC, role of OT and POC, occupational interview, bed mobility sit<>supine, functional sit<>stand transfers from EOB, short distance mobility bed<>door without an AD, activity tolerance, fall prevention, discharge planning, self-feeding of breakfast    Activity Tolerance During Today's Session  Limited by fatigue    Plan  Planned Frequency of Treatment: Plan of Care Initiated: 07/26/24  1-2x per day Weekly Frequency: 2-3 days per week  Planned Treatment Duration: 08/02/24    Planned Interventions:  Education (Patient/Family/Caregiver), Therapeutic Activity, Home Exercise Program, Self-Care/Home Training, Therapeutic Exercise      GOALS:   Patient and Family Goals: Go home    Short Term:   SHORT GOAL #1: Pt will complete BSC transfer and all toileting tasks with Supervision, using LRAD   Time Frame : 1 week  SHORT GOAL #2: Pt will complete full body dressing tasks with Supervision, using adaptive equipment as needed   Time Frame : 1 week  SHORT GOAL #3: Pt will complete standing grooming routine at sink for <5 minutes with Supervision, using LRAD   Time Frame : 1 week    Long Term Goal #1: Pt will score a 21+/24 on the AM-PAC to maximize Independence in ADLs and safety prior to discharge home  Time Frame: 2 weeks    Prognosis:  Fair  Positive Indicators:  PLOF, caregiver support  Barriers to Discharge: Endurance deficits, Functional strength deficits    Subjective  Prior Functional Status At baseline, pt completes ADLs grossly Mod I and ambulates without an assistive device. Pt with limited activity tolerance 2/2 decreased endurance at baseline. Pt lives with her retired husband who manages all the IADLs.    Living Situation  Living environment: House  Lives With: Spouse, Family (Step-daughter and son-in-law)  Home Living: One level home, Ramped entrance, Handicapped height toilet, Grab bars in shower, Hand-held  shower hose, Grab bars around toilet, Built-in shower seat  Caregiver Identified?: Yes (Husband)  Equipment available at home: Rollator, Bedside commode    Medical Tests / Procedures: Medical record reviewed in EPIC       Patient / Caregiver reports: I'm all good, my home is set-up for the crippled      Past Medical History[1] Social History     Tobacco Use    Smoking status: Never     Passive exposure: Past    Smokeless tobacco: Never   Substance Use Topics    Alcohol use: No      Past Surgical History[2] Family History[3]     Shellfish containing products, Glipizide , and Lisinopril      Objective Findings  Precautions / Restrictions  Falls precautions      Weight Bearing   Non-applicable    Required Braces or Orthoses  Non-applicable    Communication Preference  Verbal, Written, Visual       Pain  No pain reported    Equipment / Environment  Vascular access (PIV, TLC, Port-a-cath, PICC)    Cognition   Orientation Level:  Oriented x 4   Arousal/Alertness:  Appropriate responses to stimuli   Attention Span:  Appears intact   Memory:  Appears intact   Following Commands:  Follows all commands without difficulty   Safety Judgment:  Good awareness of safety precautions   Awareness of Errors and Problem Solving:  Patient self-corrected errors    Vision / Hearing   Vision: No acute deficits identified     Hearing: No deficit identified         Hand Function:  Right Hand Function: Right hand function impaired  Right Hand Impairment: grip strength fair  Left Hand Function: Left hand function impaired  Left Hand Impairment: grip strength fair  Hand Dominance: Right    Skin Inspection:  Skin Inspection: Intact where visualized, Swelling  Skin Inspection comment: BLE edema    Face/Cervical ROM:  Face ROM: WFL  Cervical ROM: WFL    ROM / Strength:  UE ROM/Strength: Left Impaired/Limited, Right Impaired/Limited  RUE Impairment: Reduced strength  LUE Impairment: Reduced strength  LE ROM/Strength: Left Impaired/Limited, Right Impaired/Limited  RLE Impairment: Reduced strength  LLE Impairment: Reduced strength    Sensation:  RUE Sensation: RUE intact  LUE Sensation: LUE intact  RLE Sensation: RLE intact  LLE Sensation: LLE intact  Sensory/ Proprioception/ Stereognosis comments: Pt denies numbness/tingling    Balance:  Animal Nutritionist of Assistance: Independent  Dynamic Sitting-Level of Assistance: Clinical Biochemist Standing-Level of Assistance: Stand by assistance  Dynamic Standing - Level of Assistance: Stand by assistance    Functional Mobility  Transfers: Standby assist (STS from EOB)  Bed Mobility : Modified Independent (Supine<>sit)    Ambulation  Level of Assistance: Standby assist, set-up cues, supervision of patient - no hands on (Pt ambulated bed<>door without an AD)  Assistive Device: Rolling walker    ADLs  Feeding : Set Up Assist, Performed at bed level (Pt demonstrated ability to self-feed breakfast at bed level with Set-up Assist)  LB Dressing: Set Up Assist, Performed seated (Pt demonstrated ability to don/doff slides while seated at EOB with Set-up A)    Vitals / Orthostatics  Vitals/Orthostatics: No signs/symptoms    Patient at end of session: All needs in reach, In bed     Occupational Therapy Session Duration  OT Individual [mins]: 15       AM-PAC Daily Activity Assessment  Lower Body  Dressing assistance needs: A Little - Minimal/Contact Guard Assist/Supervision  Bathing assistance needs: A Little - Minimal/Contact Guard Assist/Supervision  Toileting assistance needs: A Little - Minimal/Contact Guard Assist/Supervision  Upper Body Dressing assistance needs: None - Modified Independent/Independent  Personal Grooming assistance needs: A Little - Minimal/Contact Guard Assist/Supervision  Eating Meals assistance needs: A Little - Minimal/Contact Guard Assist/Supervision      Daily Activity Score: 19    Score (in points): % of Functional Impairment, Limitation, Restriction  5: 100% impaired, limited, restricted  6-7: At least 80%, but less than 100% impaired, limited restricted  8-11: At least 60%, but less than 80% impaired, limited restricted  12-16: At least 40%, but less than 60% impaired, limited restricted  17-18: At least 20%, but less than 40% impaired, limited restricted  19: At least 1%, but less than 20% impaired, limited restricted  20: 0% impaired, limited restricted    'AM-PAC' forms are Copyright protected by [employer] [facility]     I attest that I have reviewed the above information.  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    Obesity     Prediabetes    [2]   Past Surgical History:  Procedure Laterality Date    ABDOMINAL WALL MESH  REMOVAL  1997, 2005    Placement, 1997, removal and replacement 2005    BLADDER SURGERY      bladder tact X2    BREAST BIOPSY Right 04/2013    benign    BREAST EXCISIONAL BIOPSY Right 05/2013    benign    BREAST SURGERY Right     precancerous spot removed    CHOLECYSTECTOMY      HYSTERECTOMY      IR EMBOLIZATION VENOUS OTHER THAN HEMORRHAGE  02/17/2023    IR EMBOLIZATION VENOUS OTHER THAN HEMORRHAGE 02/17/2023 Babara Lash, MD IMG VIR [facility]    PR COLONOSCOPY FLX DX W/COLLJ SPEC WHEN PFRMD N/A 08/15/2023    Procedure: COLONOSCOPY, FLEXIBLE, PROXIMAL TO SPLENIC FLEXURE; DIAGNOSTIC, W/WO COLLECTION SPECIMEN BY BRUSH OR WASH;  Surgeon: Charlanne Kipper, MD;  Location: [facility] [place];  Service: Gastrointestinal    PR COLSC FLX W/RMVL OF TUMOR POLYP LESION SNARE TQ N/A 10/14/2016    Procedure: COLONOSCOPY FLEX; W/REMOV TUMOR/LES BY SNARE;  Surgeon: Eleanor Dewey Sorrel, MD;  Location: [facility] [place];  Service: Gastroenterology    PR COLSC FLX W/RMVL OF TUMOR POLYP LESION SNARE TQ N/A 01/15/2023    Procedure: COLONOSCOPY FLEX; W/REMOV TUMOR/LES BY SNARE;  Surgeon: Fae Alto, MD;  Location: [facility];  Service: Gastroenterology    PR GI IMAG INTRALUMINAL ESOPHAGUS-ILEUM W/I&R N/A 08/15/2023    Procedure: GI VIDEO TRACT IMAGE INTRALUMINAL (EG, CAPSULE ENDOSCOPY), ESOPHAGUS VIA ILEUM, PHYSICIAN INTERPRETATION & REPORT;  Surgeon: Charlanne Kipper, MD;  [facility]: [facility];  Service: Gastrointestinal    PR PERC CLOS,CONG INTERATRIAL COMMUN W/IMPL N/A 09/12/2022    Procedure: ASD closure;  Surgeon:  Pia Sharper, MD;  Location: [facility] CATH;  Service: Cardiology    PR UPPER GI ENDOSCOPY,BIOPSY N/A 01/15/2023    Procedure: UGI ENDOSCOPY; WITH BIOPSY, SINGLE OR MULTIPLE;  Surgeon: Fae Alto, MD;  Location: [facility];  Service: Gastroenterology    PR UPPER GI ENDOSCOPY,DIAGNOSIS N/A 08/14/2023    Procedure: UGI ENDO, INCLUDE ESOPHAGUS, STOMACH, & DUODENUM &/OR JEJUNUM; DX W/WO COLLECTION SPECIMN, BY BRUSH OR WASH;  Surgeon: Charlanne Kipper, MD;  Location: [facility];  Service: Gastrointestinal    SKIN BIOPSY     [3]   Family History  Problem Relation Age of Onset    Heart disease Mother     Hypertension Mother     COPD Mother     Heart disease Father     Kidney disease Father     Allergies Father     Gout Father     Diabetes Father     Allergies Other         FAMILY H/O    Coronary artery disease Other         FAMILY H/O    Kidney failure Other         FAMILY H/O    No Known Problems Sister     No Known Problems Daughter     No Known Problems Maternal Grandmother     No Known Problems Maternal Grandfather     No Known Problems Paternal Grandmother     No Known Problems Paternal Grandfather     Alcohol abuse Maternal Uncle     Birth defects Son     Birth defects Son     BRCA 1/2 Neg Hx     Breast cancer Neg Hx     Cancer Neg Hx     Colon cancer Neg Hx     Endometrial cancer Neg Hx     Ovarian cancer Neg Hx     Substance Abuse Disorder Neg Hx     Mental illness Neg Hx

## 2024-07-26 NOTE — Discharge Summary (Cosign Needed)
 Physician Discharge Summary [facility]BR  1 [facility]  [address]  [place] [address]  Dept: [phone]  Loc: [phone]     Identifying Information:   Denise York  03/05/1966  [id number]    Primary Care Physician: Waylan Leita Linsey, MD   Code Status: Full Code    Admit Date: 07/21/2024    Discharge Date: 07/26/2024     Discharge To: Home with Home Health and/or PT/OT    Discharge Service: [facility] - [facility]     Discharge Attending Physician: Norleen Ozell Bible, MD    Discharge Diagnoses:  Principal Problem:    Pleural effusion, right  Active Problems:    Type 2 diabetes mellitus without complications (CMS-HCC)    Decompensated cirrhosis    (CMS-HCC)    Portal hypertension    (CMS-HCC)    Metabolic dysfunction-associated steatohepatitis (MASH)    Anemia      Outpatient Provider Follow Up Issues:   - CBC to assess anemia  - Ensure patient has outpatient pulmonology follow up for Rt pleural effusion   - Ensure patient has outpatient GI follow up     Hospital Course:   ##SOB - R pleural effusion  Patient presents with 2 day hx of chest discomfort and shortness of breath. Oxygen saturation ranged from high 80s to low 90s on 2 L [place] while in the emergency room. ED workup notable for large R loculated pleural effusion on CXR. Negative troponins and normal EKG. ProBNP elevated to 522 though decreased from most recent level of 972. Echo obtained revealed EF at 65-70%, with mildly increased LV wall thickness and mild mitral valve regurgitation. POCUS revealing moderate R sided pleural effusion with tethered lung. Physical exam revealed decreased breath sounds over R side. Patient diuresed appropriately with IV Bumex , and transitioned to PO prior to discharge given improvement in oxygen requirement.     ##Anemia  Patient was hospitalized from December 17 to 19 for recurrent falls and anemia. Patient was found to be anemic at 5.8 requiring 2 units PRBC. Recent Iron  panel in 12/'25 evident for Ferritin 9.2, Iron  58, TIC 297.4. At time of admission, hgb 7.7 without signs of active bleed. Patient received 1 unit pRBC transfusion on 1/15, which increased Hgb to 8.8 . Given drop of hemoglobin, Lovenox  was held. She was restarted on PO iron  every other day. Hemoglobin trended daily.     ##MASH cirrhosis - Portal Hypertension   Continued at home lactulose , rifaximin , spironolactone  with daily PT/INR    Chronic Problems     ##T2DM  - Monitored daily CMP     ##GERD  - Continued Pantoprazole  20 mg daily      ##Anxiety - MDD  - Continued Sertraline  100 mg daily     Touchbase with Outpatient Provider:  Warm Handoff: Completed on 07/26/2024 by Ozell FORBES Bussing, MD  (Intern) via [facility] Message    Procedures:  None  No admission procedures for hospital encounter.  ______________________________________________________________________  Discharge Medications:     Your Medication List        CHANGE how you take these medications      bumetanide  2 MG tablet  Commonly known as: BUMEX   Take 1 tablet (2 mg total) by mouth two (2) times a day.  What changed:   medication strength  when to take this            CONTINUE taking these medications      AEROCHAMBER MV inhaler  Generic drug: inhalational  spacing device  Use as directed with inhalers     atorvastatin  20 MG tablet  Commonly known as: LIPITOR   TAKE ONE TABLET BY MOUTH ONCE DAILY     blood-glucose meter kit  Use to test blood sugar DAILY     carvedilol  6.25 MG tablet  Commonly known as: COREG   Take 1 tablet (6.25 mg total) by mouth two (2) times a day.     cholecalciferol  (vitamin D3 25 mcg (1,000 units)) 1,000 unit (25 mcg) tablet  Take 2 tablets (50 mcg total) by mouth before bedtime.     DAILY-VITE (WITH FOLIC ACID) 400 mcg Tab tablet  Generic drug: multivitamin with folic acid  Take 1 tablet by mouth daily.     FeroSul 325 mg (65 mg iron ) tablet  Generic drug: ferrous sulfate   Take 1 tablet (325 mg total) by mouth every other day.     glucose blood test strip  Generic drug: blood sugar diagnostic  Use to test blood sugar daily     lactulose  20 gram packet  Commonly known as: CEPHULAC   Mix and take 2 packets (40 g total) by mouth Three (3) times a day.     lancets Misc  Use to test blood sugar daily     loratadine  10 mg tablet  Commonly known as: CLARITIN   TAKE ONE TABLET BY MOUTH ONCE DAILY     metFORMIN  500 MG 24 hr tablet  Commonly known as: GLUCOPHAGE -XR  Take 1 tablet (500 mg total) by mouth every evening.     multivitamin per tablet  Commonly known as: TAB-A-VITE/THERAGRAN  Take 1 tablet by mouth daily.     naltrexone  50 mg tablet  Commonly known as: DEPADE  TAKE ONE TABLET BY MOUTH ONCE DAILY     omeprazole  20 MG capsule  Commonly known as: PriLOSEC  TAKE 1 CAPSULE BY MOUTH TWICE DAILY 30MINS BEFORE MEALS     ondansetron  8 MG tablet  Commonly known as: ZOFRAN   Take 1 tablet (8 mg total) by mouth every eight (8) hours as needed for nausea.     OZEMPIC  2 mg/dose (8 mg/3 mL) Pnij  Generic drug: semaglutide   INJECT 2MG  under the skin once weekly (every 7 days)     sertraline  100 MG tablet  Commonly known as: ZOLOFT   Take 1 tablet (100 mg total) by mouth daily.     spironolactone  100 MG tablet  Commonly known as: ALDACTONE   TAKE 2 TABLETS BY MOUTH ONCE DAILY     XIFAXAN  550 mg Tab  Generic drug: rifAXIMin   Take 1 tablet (550 mg total) by mouth two (2) times a day.              Allergies:  Shellfish containing products, Glipizide , and Lisinopril   ______________________________________________________________________  Pending Test Results (if blank, then none):      Most Recent Labs:  All lab results last 24 hours -   Recent Results (from the past 24 hours)   Phosphorus Level    Collection Time: 07/26/24  8:37 AM   Result Value Ref Range    Phosphorus 2.8 2.4 - 5.1 mg/dL   PT-INR    Collection Time: 07/26/24  8:37 AM   Result Value Ref Range    PT 15.8 (H) 9.9 - 12.6 sec    INR 1.40    Magnesium  Level    Collection Time: 07/26/24  8:37 AM   Result Value Ref Range    Magnesium  1.5 (L) 1.6 - 2.6 mg/dL  CBC    Collection Time: 07/26/24  8:37 AM   Result Value Ref Range    WBC 6.9 3.6 - 11.2 10*9/L    RBC 3.30 (L) 3.95 - 5.13 10*12/L    HGB 8.6 (L) 11.3 - 14.9 g/dL    HCT 73.7 (L) 65.9 - 44.0 %    MCV 79.1 77.6 - 95.7 fL    MCH 26.0 25.9 - 32.4 pg    MCHC 32.9 32.0 - 36.0 g/dL    RDW 76.6 (H) 87.7 - 15.2 %    MPV 11.0 (H) 6.8 - 10.7 fL    Platelet 60 (L) 150 - 450 10*9/L   Comprehensive Metabolic Panel    Collection Time: 07/26/24  8:37 AM   Result Value Ref Range    Sodium 139 135 - 145 mmol/L    Potassium 3.5 3.4 - 4.8 mmol/L    Chloride 95 (L) 98 - 107 mmol/L    CO2 30.4 20.0 - 31.0 mmol/L    Anion Gap 14 5 - 14 mmol/L    BUN 11 9 - 23 mg/dL    Creatinine 8.89 (H) 0.55 - 1.02 mg/dL    BUN/Creatinine Ratio 10     eGFR CKD-EPI (2021) Female 58 (L) >=60 mL/min/1.4m2    Glucose 123 70 - 179 mg/dL    Calcium 8.8 8.7 - 89.5 mg/dL    Albumin 2.2 (L) 3.4 - 5.0 g/dL    Total Protein 5.7 5.7 - 8.2 g/dL    Total Bilirubin 1.9 (H) 0.3 - 1.2 mg/dL    AST 58 (H) <=65 U/L    ALT 37 10 - 49 U/L    Alkaline Phosphatase 118 (H) 46 - 116 U/L       Relevant Studies/Radiology (if blank, then none):  Echocardiogram W Colorflow Spectral Doppler  Result Date: 07/23/2024  Patient Info Name:     Denise York Age:     58 years DOB:     09-20-1965 Gender:     Female MRN:     [id number] Accession #:     [id number] UN Account #:     [id number] Ht:     168 cm Wt:     92 kg BSA:     2.10 m2 BP:     137 /     56 mmHg HR:     93 bpm Heart Rhythm:     Sinus Rhythm Exam Date:     07/22/2024 8:40 AM Admit Date:     07/21/2024 Exam Type:     ECHOCARDIOGRAM W COLORFLOW SPECTRAL DOPPLER Technical Quality:     Fair Staff Sonographer:     Clotilda Haddock Reading Fellow:     Genevie Southgate Ordering Physician:     Vernell FORBES [place] Study Info Indications      - new pleural effusion Procedure(s)   Complete two-dimensional, color flow and Doppler transthoracic echocardiogram is performed. Summary   1. The left ventricle is normal in size with mildly increased wall thickness.   2. The left ventricular systolic function is normal, LVEF is visually estimated at 60-65%.   3. The right ventricle is mildly dilated in size, with normal systolic function.   4. There is mild mitral valve regurgitation. Left Ventricle   The left ventricle is normal in size with mildly increased wall thickness. The left ventricular systolic function is normal, LVEF is visually estimated at 60-65%. There is normal left ventricular diastolic function. Right Ventricle   The right ventricle is mildly dilated  in size, with normal systolic function. Left Atrium   The left atrium is normal in size. Right Atrium   The right atrium is normal in size. Aortic Valve   The aortic valve is trileaflet with normal appearing leaflets with normal excursion. There is no significant aortic regurgitation. There is no evidence of a significant transvalvular gradient. Mitral Valve   The mitral valve leaflets are mildly thickened with mildly reduced leaflet mobility. There is mild mitral valve regurgitation. There is no mitral stenosis. Tricuspid Valve   The tricuspid valve leaflets are normal, with normal leaflet mobility. There is trivial tricuspid regurgitation. The pulmonary systolic pressure cannot be estimated due to insufficient TR signal. Pulmonic Valve   The pulmonic valve is normal. There is no significant pulmonic regurgitation. There is no evidence of a significant transvalvular gradient. Aorta   The aorta is normal in size in the visualized segments. Inferior Vena Cava   IVC size and inspiratory change suggest normal right atrial pressure. (0-5 mmHg). Pericardium/Pleural   There is no pericardial effusion. Other Findings   Rhythm: Sinus Rhythm. Ventricles ---------------------------------------------------------------------- Name                                 Value        Normal ---------------------------------------------------------------------- LV Dimensions 2D/MM ----------------------------------------------------------------------  IVS Diastolic Thickness (2D)                                1.2 cm       0.6-0.9 LVID Diastole (2D)                  4.2 cm       3.8-5.2  LVPW Diastolic Thickness (2D)                                1.1 cm       0.6-0.9 LVID Systole (2D)                   2.7 cm       2.2-3.5 LV Mass Index (2D Cubed)           80 g/m2         43-95  Relative Wall Thickness (2D)                                  0.52        <=0.42 RV Dimensions 2D/MM ----------------------------------------------------------------------  RV Basal Diastolic Dimension                           4.5 cm       2.5-4.1 TAPSE                               3.1 cm         >=1.7 Atria ---------------------------------------------------------------------- Name                                 Value        Normal ---------------------------------------------------------------------- LA Dimensions ---------------------------------------------------------------------- LA Dimension (2D)  3.4 cm       2.7-3.8 LA Volume Index (4C A-L)        26.75 ml/m2               LA Volume Index (2C A-L)        32.95 ml/m2               LA Volume (BP MOD)                   60 ml               LA Volume Index (BP MOD)        28.49 ml/m2   16.00-34.00 RA Dimensions ---------------------------------------------------------------------- RA Area (4C)                      13.1 cm2        <=18.0 RA Area (4C) Index              6.2 cm2/m2               RA ESV Index (4C MOD)             17 ml/m2         15-27 Aortic Valve ---------------------------------------------------------------------- Name                                 Value        Normal ---------------------------------------------------------------------- AV Doppler ---------------------------------------------------------------------- AV Peak Velocity                   2.4 m/s               AV Peak Gradient                   22 mmHg Mitral Valve ---------------------------------------------------------------------- Name                                 Value        Normal ---------------------------------------------------------------------- MV Diastolic Function ---------------------------------------------------------------------- MV E Peak Velocity                 98 cm/s               MV A Peak Velocity                119 cm/s               MV E/A                                 0.8               MV Annular TDI ---------------------------------------------------------------------- MV Septal e' Velocity             7.1 cm/s         >=8.0 MV E/e' (Septal)                      13.8               MV Lateral e' Velocity            8.6 cm/s        >=10.0 MV E/e' (Lateral)  11.4               MV e' Average                     7.8 cm/s               MV E/e' (Average)                     12.6 Tricuspid Valve ---------------------------------------------------------------------- Name                                 Value        Normal ---------------------------------------------------------------------- Estimated PAP/RSVP ---------------------------------------------------------------------- RA Pressure                         3 mmHg           <=5 Pulmonic Valve ---------------------------------------------------------------------- Name                                 Value        Normal ---------------------------------------------------------------------- PV Doppler ---------------------------------------------------------------------- PV Peak Velocity                   1.5 m/s Aorta ---------------------------------------------------------------------- Name                                 Value        Normal ---------------------------------------------------------------------- Ascending Aorta ---------------------------------------------------------------------- Ao Root Diameter (2D)               3.0 cm               Ao Root Diam Index (2D)          1.4 cm/m2               Ascending Aorta Diameter            3.3 cm Venous ---------------------------------------------------------------------- Name                                 Value        Normal ---------------------------------------------------------------------- IVC/SVC ---------------------------------------------------------------------- IVC Diameter (Insp 2D)              0.3 cm               IVC Diameter (Exp 2D)               1.1 cm         <=2.1  IVC Diameter Percent Change (2D)                                  77 %          >=50 Report Signatures Finalized by Velma Glendia Bevels  MD on 07/23/2024 03:44 PM Resident Genevie Southgate on 07/22/2024 01:43 PM    ECG 12 Lead  Result Date: 07/22/2024  NORMAL SINUS RHYTHM NONSPECIFIC ST AND T WAVE ABNORMALITY WHEN COMPARED WITH ECG OF 21-Jul-2024 17:49, NO SIGNIFICANT CHANGE WAS FOUND Confirmed by Mazzella, Tony [phone]) on 07/22/2024 6:22:55 AM    ECG 12 Lead  Result Date: 07/22/2024  NORMAL  SINUS RHYTHM NONSPECIFIC ST AND T WAVE ABNORMALITY WHEN COMPARED WITH ECG OF 21-Jul-2024 19:54, NO SIGNIFICANT CHANGE WAS FOUND Confirmed by Mazzella, Tony ([id number]) on 07/22/2024 6:16:33 AM    ECG 12 Lead  Result Date: 07/21/2024  NORMAL SINUS RHYTHM NORMAL ECG WHEN COMPARED WITH ECG OF 23-Jun-2024 15:04, NONSPECIFIC T WAVE ABNORMALITY, IMPROVED IN LATERAL LEADS Confirmed by Mazzella, Tony ([id number]) on 07/21/2024 8:34:07 PM    XR Chest 2 views  Result Date: 07/21/2024  EXAM: XR CHEST 2 VIEWS ACCESSION: [id number] UN REPORT DATE: 07/21/2024 7:19 PM CLINICAL INDICATION: CHEST PAIN ; Chest Pain  TECHNIQUE: PA and Lateral Chest Radiographs COMPARISON: Multiple prior radiographs, most recently from 09/03/2023 FINDINGS: A large right-sided pleural effusion has developed in the interval. It has a lobulated border superiorly. There is associated subsegmental atelectasis. The visualized cardiomediastinal silhouette is stable. 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 There are calcifications of the aortic knob. The patient is noted to be status post cholecystectomy. Metallic coils are again noted within the upper abdomen. There is no pneumothorax. The left lung is clear. Osseous structures are within normal limits.     Interval development of a large right-sided pleural effusion which appears loculated.     ______________________________________________________________________  Discharge Instructions:           Other Instructions       Discharge instructions      You were admitted to [facility] for shortness of breath, found to have a right pleural effusion. You were treated with diuretics. You are now ready to discharge and will be discharging Home. It is important to continue following up with your primary care doctor and any other doctors involved with your care. Some of your medications may have changed, please see instructions below. Your follow up appointments are listed below.  Activity: activity as tolerated. Diet: regular diet     Please carefully read and follow these instructions below upon your discharge. It has been a pleasure taking care of you, and we wish you the best!    1) Please take your medications as prescribed. Major medication changes are listed below, and a full list of medications is in this discharge packet. At future follow-up appointments, please be sure to take all of your medications with you so your provider can better guide your care.     MEDICATION CHANGES:  -- CONTINUE Bumex  2mg  twice daily     FOLLOW-UP:  2) It is recommended that you see your primary care provider (PCP) after being in the hospital. If you do not see an appointment listed below with your primary care doctor, please call your doctor's office as soon as possible to schedule an appointment to be seen within 7-10 days of discharge.     We are currently setting you up with an appointment with your PCP and GI doctors. If you do not here back within 1 week, please contact your PCP directly to schedule an appointment.     3) Please go to any follow-up appointments that were already scheduled. Your already scheduled [place] follow-up appointments have been listed in this discharge packet.     4) Seek medical care with your primary care doctor or local Emergency Room or Urgent Care if you develop any changes in your mental status, worsening abdominal pain, fevers greater than or equal to 100.4, any unexplained/unrelieved shortness of breath, uncontrolled nausea and vomiting that keeps you from remaining hydrated or taking your medication, or any other concerning symptoms.  Follow Up instructions and Outpatient Referrals     Ambulatory Referral to Pulmonology      Reason for referral: Rt pleural effusion    Discharge instructions          Appointments which have been scheduled for you      Aug 23, 2024 10:00 AM  (Arrive by 9:30 AM)  SOCIAL WORK with Bruno LOISE Mania  [facility] LIVER TRANSPLANT [place] [facility] REGION) [address]  [place] [address]  [phone]        Aug 23, 2024 11:00 AM  (Arrive by 10:30 AM)  NURSE VISIT with SURTRA NURSE  [facility] TRANSPLANT SURGERY [place] [facility] REGION) [address]  [place] [address]  [phone]        Aug 23, 2024 11:15 AM  (Arrive by 10:45 AM)  OFFICE VISIT with Elayne Sells, MD  [facility] TRANSPLANT SURGERY [place] [facility] REGION) [address]  [place] [address]  [phone]        Aug 23, 2024 12:00 PM  (Arrive by 11:30 AM)  RETURN PSYCHOLOGY with Donnice Dallas Sol, PsyD  [facility] TRANSPLANT SURGERY [place] [facility] REGION) [address]  [place] [address]  [phone]        Aug 23, 2024 1:00 PM  (Arrive by 12:30 PM)  X-Ray Chest with KANDICE BRESLOW RM 4  IMG DIAG X-RAY [facility] [address]  [place] [address]  [phone]        Aug 23, 2024 1:40 PM  (Arrive by 1:10 PM)  LAB ONLY with LAB PHLEB GRND [facility]  LAB PHLEB GRND [facility] [employer] [facility] REGION) [address]  [place] [address]  [phone]        Aug 23, 2024 2:30 PM  (Arrive by 2:00 PM)  ECHOCARDIOGRAM WITH COLORFLOW SPECTRAL DOPPLER with [facility] [place] [facility] ECHO RM 2  IMG ECHO [facility] [address]  [place] [address]  [phone]        Aug 24, 2024 12:30 PM  (Arrive by 12:00 PM)  TELEPHONE with Asheton LOISE Gaskins  [facility] LIVER TRANSPLANT [place] [facility] REGION) [address]  [place] [address]  [phone]        Oct 14, 2024 12:00 PM  (Arrive by 11:45 AM)  RETURN CONTINUITY with Leita Sharyne Haddock, MD  [facility]) [address] [place] [address]  [phone]        Dec 17, 2024 12:20 PM  RETURN CARDIOLOGY with Margery Ruth, MD  [facility] CARDIOLOGY AT [facility] REGION) [address] [place] [address]  [phone]        Jan 31, 2025 3:30 PM  (Arrive by 3:00 PM)  RETURN HEPATOLOGY with Cena Collin Seen, MD  [facility] LIVER TRANSPLANT [facility] REGION) [address]  [place] [address]  [phone]             ______________________________________________________________________  Discharge Day Services:  BP 128/58  - Pulse 99  - Temp 36.8 ??C (98.2 ??F) (Oral)  - Resp 18  - Ht 167.6 cm (5' 5.98)  - Wt 92 kg (202 lb 12.8 oz)  - SpO2 99%  - BMI 32.75 kg/m??   Pt seen on the day of discharge and  determined appropriate for discharge.    Gen: Ill appearing female lying in hospital bed on2L [place]; converses appropriately  Eyes: Sclera anicteric, EOMI grossly normal   Heart: Regular rate and rhythm with systolic murmur best heard over LUSB & RUSB  Lungs: L lung fields clear throughout; diminished breath sounds over R lung fields  Abdomen: Mild generalized abdominal discomfort   Extremities: 2+ lower extremity edema bilaterally  Psych: Alert, oriented    Condition at Discharge: good    Length of Discharge: I spent greater than 30 mins in the discharge of this patient.

## 2024-07-26 NOTE — Plan of Care (Signed)
 Shift Summary  Bed alarm and fall reduction interventions were maintained throughout the shift, with no falls or injuries reported.   Perineal hygiene and cohorting were performed, and CHG wipes were used to support infection prevention.   Minimal assistance was required for hygiene and bathing, and oral care was completed independently.   Melatonin was administered PRN to support sleep.   Patient remained safe and stable with consistent application of safety and infection prevention measures.     Absence of Hospital-Acquired Illness or Injury: Cohorting and perineal hygiene were maintained, and CHG wipes were used throughout the shift; no changes in infection prevention measures were noted. Bed alarm and fall reduction interventions were consistently applied to support safety.     Improved Ability to Complete Activities of Daily Living: Minimal assistance was required for general hygiene and bathing, with oral care performed independently and bathing completed by patient/family. Level of assistance for mobility remained at standby with set-up cues and supervision, with no hands-on help needed.     Absence of Fall and Fall-Related Injury: Fall reduction program, bed alarm, and regular toileting every two hours were maintained, with hourly visual checks confirming patient remained in bed. No falls or injuries occurred during the shift.

## 2024-07-27 DIAGNOSIS — R54 Age-related physical debility: Principal | ICD-10-CM

## 2024-07-27 DIAGNOSIS — K7460 Unspecified cirrhosis of liver: Principal | ICD-10-CM

## 2024-07-27 DIAGNOSIS — Z7682 Awaiting organ transplant status: Principal | ICD-10-CM

## 2024-07-27 DIAGNOSIS — K7290 Hepatic failure, unspecified without coma: Principal | ICD-10-CM

## 2024-07-27 NOTE — Telephone Encounter (Signed)
 Spoke to patient and her sister Marval individually to discuss palliative care referral.  Recent falls, frailty, and fatigue, low meld score and uncertain amount of wait time for liver transplant being the driving factors for referral.  All questions answered appropriately.     They both verbalized understanding and are willing to schedule with palliative care.

## 2024-08-08 NOTE — Progress Notes (Signed)
 Please see patient surgery and social work visit for documentation.

## 2024-08-10 NOTE — Telephone Encounter (Signed)
 Reached out to Ms. Pinnix to schedule her for a thoracentesis but got her voicemail. LVM with my contact info requesting a call back.
